# Patient Record
Sex: Male | Born: 1950
Health system: Southern US, Community
[De-identification: ages and names within clinical notes are randomized; demographics above are authoritative.]

## PROBLEM LIST (undated history)

## (undated) DIAGNOSIS — I10 Essential (primary) hypertension: Secondary | ICD-10-CM

## (undated) DIAGNOSIS — I509 Heart failure, unspecified: Secondary | ICD-10-CM

## (undated) DIAGNOSIS — M21379 Foot drop, unspecified foot: Secondary | ICD-10-CM

## (undated) DIAGNOSIS — T7840XA Allergy, unspecified, initial encounter: Secondary | ICD-10-CM

## (undated) DIAGNOSIS — I350 Nonrheumatic aortic (valve) stenosis: Secondary | ICD-10-CM

## (undated) DIAGNOSIS — I25119 Atherosclerotic heart disease of native coronary artery with unspecified angina pectoris: Secondary | ICD-10-CM

## (undated) DIAGNOSIS — Z9119 Patient's noncompliance with other medical treatment and regimen: Secondary | ICD-10-CM

## (undated) DIAGNOSIS — M48062 Spinal stenosis, lumbar region with neurogenic claudication: Secondary | ICD-10-CM

## (undated) DIAGNOSIS — I4811 Longstanding persistent atrial fibrillation: Secondary | ICD-10-CM

## (undated) DIAGNOSIS — Z7901 Long term (current) use of anticoagulants: Secondary | ICD-10-CM

## (undated) DIAGNOSIS — I639 Cerebral infarction, unspecified: Secondary | ICD-10-CM

## (undated) DIAGNOSIS — I4891 Unspecified atrial fibrillation: Secondary | ICD-10-CM

## (undated) DIAGNOSIS — H269 Unspecified cataract: Secondary | ICD-10-CM

## (undated) DIAGNOSIS — G4733 Obstructive sleep apnea (adult) (pediatric): Secondary | ICD-10-CM

## (undated) DIAGNOSIS — G4730 Sleep apnea, unspecified: Secondary | ICD-10-CM

## (undated) DIAGNOSIS — M159 Polyosteoarthritis, unspecified: Secondary | ICD-10-CM

## (undated) DIAGNOSIS — I519 Heart disease, unspecified: Secondary | ICD-10-CM

## (undated) DIAGNOSIS — I272 Pulmonary hypertension, unspecified: Secondary | ICD-10-CM

## (undated) HISTORY — DX: Morbid (severe) obesity due to excess calories: E66.01

## (undated) HISTORY — PX: TIBIA FRACTURE SURGERY: SHX806

## (undated) HISTORY — PX: REPLACEMENT TOTAL KNEE: SUR1224

## (undated) HISTORY — DX: Heart failure, unspecified: I50.9

## (undated) HISTORY — DX: Heart disease, unspecified: I51.9

## (undated) HISTORY — DX: Nonrheumatic aortic (valve) stenosis: I35.0

## (undated) HISTORY — DX: Longstanding persistent atrial fibrillation: I48.11

## (undated) HISTORY — DX: Obstructive sleep apnea (adult) (pediatric): G47.33

## (undated) HISTORY — PX: EYE SURGERY: SHX253

## (undated) HISTORY — DX: Spinal stenosis, lumbar region with neurogenic claudication: M48.062

## (undated) HISTORY — DX: Essential (primary) hypertension: I10

## (undated) HISTORY — DX: Foot drop, unspecified foot: M21.379

## (undated) HISTORY — DX: Unspecified cataract: H26.9

## (undated) HISTORY — DX: Sleep apnea, unspecified: G47.30

## (undated) HISTORY — DX: Patient's noncompliance with other medical treatment and regimen: Z91.19

## (undated) HISTORY — PX: JOINT REPLACEMENT: SHX530

## (undated) HISTORY — DX: Pulmonary hypertension, unspecified: I27.20

## (undated) HISTORY — DX: Allergy, unspecified, initial encounter: T78.40XA

## (undated) HISTORY — DX: Long term (current) use of anticoagulants: Z79.01

## (undated) HISTORY — DX: Polyosteoarthritis, unspecified: M15.9

## (undated) HISTORY — DX: Unspecified atrial fibrillation: I48.91

## (undated) HISTORY — DX: Cerebral infarction, unspecified: I63.9

## (undated) HISTORY — DX: Atherosclerotic heart disease of native coronary artery with unspecified angina pectoris: I25.119

## (undated) SURGERY — IRRIGATION AND DEBRIDEMENT WOUND
Anesthesia: General | Site: Leg Upper | Laterality: Right

---

## 2015-05-29 LAB — BASIC METABOLIC PANEL
POTASSIUM: 3.6 mmol/L (ref 3.4–5.3)
SODIUM: 140 mmol/L (ref 137–147)

## 2015-05-29 LAB — HEPATIC FUNCTION PANEL
ALT: 43 U/L — AB (ref 10–40)
AST: 31 U/L (ref 14–40)
Alkaline Phosphatase: 107 U/L (ref 25–125)
BILIRUBIN, TOTAL: 0.5 mg/dL

## 2015-05-29 LAB — LIPID PANEL
Cholesterol: 148 mg/dL (ref 0–200)
HDL: 36 mg/dL (ref 35–70)
LDL Cholesterol: 60 mg/dL
Triglycerides: 259 mg/dL — AB (ref 40–160)

## 2015-05-29 LAB — HEMOGLOBIN A1C: Hemoglobin A1C: 6.2

## 2015-08-08 LAB — LIPID PANEL
Cholesterol: 150 mg/dL (ref 0–200)
HDL: 38 mg/dL (ref 35–70)
LDL CALC: 66 mg/dL

## 2015-08-08 LAB — HEPATIC FUNCTION PANEL
ALK PHOS: 94 U/L (ref 25–125)
ALT: 57 U/L — AB (ref 10–40)
AST: 35 U/L (ref 14–40)
Bilirubin, Total: 0.7 mg/dL

## 2015-08-08 LAB — BASIC METABOLIC PANEL
Creatinine: 0.9 mg/dL (ref 0.6–1.3)
Potassium: 3.3 mmol/L — AB (ref 3.4–5.3)
Sodium: 141 mmol/L (ref 137–147)

## 2015-08-08 LAB — CALCIUM: CALCIUM: 9.3 mg/dL

## 2015-08-08 LAB — CO2, TOTAL: Carbon Dioxide, Total: 32

## 2015-08-08 LAB — HEMOGLOBIN A1C: HEMOGLOBIN A1C: 6.1

## 2015-08-08 LAB — CHLORIDE: Chloride: 101 mmol/L

## 2015-08-08 LAB — ALBUMIN: ALBUMIN: 3.8

## 2015-08-08 LAB — T4, FREE: Free T4: 1.05

## 2015-08-08 LAB — TSH: TSH: 0.74 u[IU]/mL (ref 0.41–5.90)

## 2016-03-14 DIAGNOSIS — G4730 Sleep apnea, unspecified: Secondary | ICD-10-CM | POA: Diagnosis not present

## 2016-03-14 DIAGNOSIS — I482 Chronic atrial fibrillation: Secondary | ICD-10-CM | POA: Diagnosis not present

## 2016-03-14 DIAGNOSIS — M179 Osteoarthritis of knee, unspecified: Secondary | ICD-10-CM | POA: Diagnosis not present

## 2016-03-14 DIAGNOSIS — R7301 Impaired fasting glucose: Secondary | ICD-10-CM | POA: Diagnosis not present

## 2016-03-14 DIAGNOSIS — I10 Essential (primary) hypertension: Secondary | ICD-10-CM | POA: Diagnosis not present

## 2016-03-14 DIAGNOSIS — E7800 Pure hypercholesterolemia, unspecified: Secondary | ICD-10-CM | POA: Diagnosis not present

## 2016-04-17 DIAGNOSIS — I509 Heart failure, unspecified: Secondary | ICD-10-CM | POA: Diagnosis not present

## 2016-04-17 DIAGNOSIS — I4891 Unspecified atrial fibrillation: Secondary | ICD-10-CM | POA: Diagnosis not present

## 2016-04-17 DIAGNOSIS — I10 Essential (primary) hypertension: Secondary | ICD-10-CM | POA: Diagnosis not present

## 2016-04-17 DIAGNOSIS — E669 Obesity, unspecified: Secondary | ICD-10-CM | POA: Diagnosis not present

## 2016-04-17 DIAGNOSIS — I350 Nonrheumatic aortic (valve) stenosis: Secondary | ICD-10-CM | POA: Diagnosis not present

## 2016-04-23 DIAGNOSIS — I4891 Unspecified atrial fibrillation: Secondary | ICD-10-CM | POA: Diagnosis not present

## 2016-05-24 DIAGNOSIS — H25812 Combined forms of age-related cataract, left eye: Secondary | ICD-10-CM | POA: Diagnosis not present

## 2016-05-24 DIAGNOSIS — H43313 Vitreous membranes and strands, bilateral: Secondary | ICD-10-CM | POA: Diagnosis not present

## 2016-05-24 DIAGNOSIS — H25811 Combined forms of age-related cataract, right eye: Secondary | ICD-10-CM | POA: Diagnosis not present

## 2016-06-10 DIAGNOSIS — I10 Essential (primary) hypertension: Secondary | ICD-10-CM | POA: Diagnosis not present

## 2016-06-10 DIAGNOSIS — I482 Chronic atrial fibrillation: Secondary | ICD-10-CM | POA: Diagnosis not present

## 2016-06-10 DIAGNOSIS — E7800 Pure hypercholesterolemia, unspecified: Secondary | ICD-10-CM | POA: Diagnosis not present

## 2016-06-10 LAB — BASIC METABOLIC PANEL
BUN: 13 (ref 4–21)
BUN: 13 mg/dL (ref 4–21)
BUN: 13 mg/dL (ref 4–21)
CREATININE: 1.2 mg/dL (ref 0.6–1.3)
CREATININE: 1.2 mg/dL (ref 0.6–1.3)
Creatinine: 1.2 (ref 0.6–1.3)
Glucose: 131
Glucose: 131 mg/dL
Glucose: 131 mg/dL
POTASSIUM: 3.3 — AB (ref 3.4–5.3)
POTASSIUM: 3.3 mmol/L — AB (ref 3.4–5.3)
POTASSIUM: 3.3 mmol/L — AB (ref 3.4–5.3)
SODIUM: 141 (ref 137–147)
SODIUM: 141 mmol/L (ref 137–147)
Sodium: 141 mmol/L (ref 137–147)

## 2016-06-10 LAB — LIPID PANEL
CHOLESTEROL: 149 (ref 0–200)
CHOLESTEROL: 149 mg/dL (ref 0–200)
Cholesterol: 149 mg/dL (ref 0–200)
HDL: 34 mg/dL — AB (ref 35–70)
HDL: 34 mg/dL — AB (ref 35–70)
HDL: 34 — AB (ref 35–70)
LDL Cholesterol: 65
LDL Cholesterol: 65 mg/dL
LDL Cholesterol: 65 mg/dL
Triglycerides: 250 mg/dL — AB (ref 40–160)
Triglycerides: 250 mg/dL — AB (ref 40–160)
Triglycerides: 250 — AB (ref 40–160)

## 2016-06-10 LAB — CBC AND DIFFERENTIAL
HCT: 41 % (ref 41–53)
HEMATOCRIT: 41 (ref 41–53)
HEMATOCRIT: 41 % (ref 41–53)
HEMOGLOBIN: 13.8 (ref 13.5–17.5)
HEMOGLOBIN: 13.8 g/dL (ref 13.5–17.5)
Hemoglobin: 13.8 g/dL (ref 13.5–17.5)
Neutrophils Absolute: 7
Neutrophils Absolute: 7 /uL
Neutrophils Absolute: 7 /uL
PLATELETS: 294 10*3/uL (ref 150–399)
Platelets: 294 (ref 150–399)
Platelets: 294 10*3/uL (ref 150–399)
WBC: 9.9
WBC: 9.9 10*3/mL
WBC: 9.9 10^3/mL

## 2016-06-10 LAB — HEPATIC FUNCTION PANEL
ALK PHOS: 89 U/L (ref 25–125)
ALT: 33 (ref 10–40)
ALT: 33 U/L (ref 10–40)
ALT: 33 U/L (ref 10–40)
AST: 30 (ref 14–40)
AST: 30 U/L (ref 14–40)
AST: 30 U/L (ref 14–40)
Alkaline Phosphatase: 89 (ref 25–125)
Alkaline Phosphatase: 89 U/L (ref 25–125)
BILIRUBIN, TOTAL: 0.6 mg/dL
Bilirubin, Total: 0.6
Bilirubin, Total: 0.6 mg/dL

## 2016-06-19 DIAGNOSIS — I10 Essential (primary) hypertension: Secondary | ICD-10-CM | POA: Diagnosis not present

## 2016-06-19 DIAGNOSIS — I482 Chronic atrial fibrillation: Secondary | ICD-10-CM | POA: Diagnosis not present

## 2016-06-19 DIAGNOSIS — R7301 Impaired fasting glucose: Secondary | ICD-10-CM | POA: Diagnosis not present

## 2016-06-19 DIAGNOSIS — M179 Osteoarthritis of knee, unspecified: Secondary | ICD-10-CM | POA: Diagnosis not present

## 2016-06-19 DIAGNOSIS — E7800 Pure hypercholesterolemia, unspecified: Secondary | ICD-10-CM | POA: Diagnosis not present

## 2016-10-08 ENCOUNTER — Encounter: Payer: Self-pay | Admitting: Family Medicine

## 2016-10-08 ENCOUNTER — Ambulatory Visit (INDEPENDENT_AMBULATORY_CARE_PROVIDER_SITE_OTHER): Payer: Medicare Other | Admitting: Family Medicine

## 2016-10-08 DIAGNOSIS — I482 Chronic atrial fibrillation, unspecified: Secondary | ICD-10-CM

## 2016-10-08 DIAGNOSIS — M159 Polyosteoarthritis, unspecified: Secondary | ICD-10-CM | POA: Insufficient documentation

## 2016-10-08 DIAGNOSIS — I272 Pulmonary hypertension, unspecified: Secondary | ICD-10-CM

## 2016-10-08 DIAGNOSIS — I639 Cerebral infarction, unspecified: Secondary | ICD-10-CM | POA: Diagnosis not present

## 2016-10-08 DIAGNOSIS — I25119 Atherosclerotic heart disease of native coronary artery with unspecified angina pectoris: Secondary | ICD-10-CM

## 2016-10-08 DIAGNOSIS — M1990 Unspecified osteoarthritis, unspecified site: Secondary | ICD-10-CM | POA: Insufficient documentation

## 2016-10-08 DIAGNOSIS — I10 Essential (primary) hypertension: Secondary | ICD-10-CM | POA: Diagnosis not present

## 2016-10-08 DIAGNOSIS — I4891 Unspecified atrial fibrillation: Secondary | ICD-10-CM | POA: Insufficient documentation

## 2016-10-08 DIAGNOSIS — M48062 Spinal stenosis, lumbar region with neurogenic claudication: Secondary | ICD-10-CM

## 2016-10-08 DIAGNOSIS — I509 Heart failure, unspecified: Secondary | ICD-10-CM | POA: Diagnosis not present

## 2016-10-08 DIAGNOSIS — M21379 Foot drop, unspecified foot: Secondary | ICD-10-CM

## 2016-10-08 DIAGNOSIS — G4733 Obstructive sleep apnea (adult) (pediatric): Secondary | ICD-10-CM | POA: Insufficient documentation

## 2016-10-08 DIAGNOSIS — R7303 Prediabetes: Secondary | ICD-10-CM | POA: Diagnosis not present

## 2016-10-08 DIAGNOSIS — Z23 Encounter for immunization: Secondary | ICD-10-CM | POA: Diagnosis not present

## 2016-10-08 DIAGNOSIS — M48061 Spinal stenosis, lumbar region without neurogenic claudication: Secondary | ICD-10-CM

## 2016-10-08 DIAGNOSIS — I5032 Chronic diastolic (congestive) heart failure: Secondary | ICD-10-CM | POA: Insufficient documentation

## 2016-10-08 DIAGNOSIS — E66813 Obesity, class 3: Secondary | ICD-10-CM

## 2016-10-08 DIAGNOSIS — I5042 Chronic combined systolic (congestive) and diastolic (congestive) heart failure: Secondary | ICD-10-CM | POA: Insufficient documentation

## 2016-10-08 DIAGNOSIS — E559 Vitamin D deficiency, unspecified: Secondary | ICD-10-CM | POA: Diagnosis not present

## 2016-10-08 DIAGNOSIS — G4730 Sleep apnea, unspecified: Secondary | ICD-10-CM

## 2016-10-08 DIAGNOSIS — I2510 Atherosclerotic heart disease of native coronary artery without angina pectoris: Secondary | ICD-10-CM

## 2016-10-08 DIAGNOSIS — Z7901 Long term (current) use of anticoagulants: Secondary | ICD-10-CM

## 2016-10-08 HISTORY — DX: Long term (current) use of anticoagulants: Z79.01

## 2016-10-08 HISTORY — DX: Chronic combined systolic (congestive) and diastolic (congestive) heart failure: I50.42

## 2016-10-08 HISTORY — DX: Chronic atrial fibrillation, unspecified: I48.20

## 2016-10-08 HISTORY — DX: Spinal stenosis, lumbar region without neurogenic claudication: M48.061

## 2016-10-08 HISTORY — DX: Pulmonary hypertension, unspecified: I27.20

## 2016-10-08 HISTORY — DX: Morbid (severe) obesity due to excess calories: E66.01

## 2016-10-08 HISTORY — DX: Unspecified atrial fibrillation: I48.91

## 2016-10-08 HISTORY — DX: Spinal stenosis, lumbar region with neurogenic claudication: M48.062

## 2016-10-08 HISTORY — DX: Obstructive sleep apnea (adult) (pediatric): G47.33

## 2016-10-08 HISTORY — DX: Atherosclerotic heart disease of native coronary artery with unspecified angina pectoris: I25.119

## 2016-10-08 HISTORY — DX: Sleep apnea, unspecified: G47.30

## 2016-10-08 HISTORY — DX: Foot drop, unspecified foot: M21.379

## 2016-10-08 HISTORY — DX: Obesity, class 3: E66.813

## 2016-10-08 HISTORY — DX: Polyosteoarthritis, unspecified: M15.9

## 2016-10-08 NOTE — Assessment & Plan Note (Addendum)
Chronic pain meds- pt has taken lortab daily for many yrs.  ( at least 77yrs now) of the 10-325mg .   Has enough pills for now.  - told patient in the future if he needs refills on his pain meds, I will give them to him one time per yr or so (PCP can give it to him another time) , but if this is a chronic deal-  pt will need to be referred to a chronic pain management specialist.

## 2016-10-08 NOTE — Progress Notes (Signed)
New patient office visit note:  Impression and Recommendations:    1. Coronary arteriosclerosis- med mgt   2. h/o Cerebrovascular accident (CVA)   3. Congestive heart failure, unspecified congestive heart failure chronicity, unspecified congestive heart failure type ([place])   4. Hypertension, unspecified type   5. OSA (obstructive sleep apnea)   6. Need for prophylactic vaccination and inoculation against influenza   7. Foot-drop, unspecified laterality   8. Spinal stenosis, lumbar region, with neurogenic claudication   9. Chronic atrial fibrillation (HCC)   10. Obesity, Class III, BMI 40-49.9 (morbid obesity) ([place])   11. Chronic anticoagulation      All of patient's chronic past medical history reviewed with him. Entire history came from patient. Asked him to please get me his medical records from his physicians in [place].   We'll obtain fasting labs today since patient is fasting and has no labs with him/ med records.   We'll also obtain baseline BNP to assess patient's CHF status- since patient has no active symptoms and currently is stable.   Patient will continue with all his specialists up in [place] as this is what he prefers- will be 50-50 split btwn here and [place]- but prefers to keep them as primary since they have known him so long.  Spinal stenosis, lumbar region, with neurogenic claudication Chronic pain meds- pt has taken lortab daily for many yrs.  ( at least 65yrs now) of the 10-325mg .   Has enough pills for now.  - told patient in the future if he needs refills on his pain meds, I will give them to him one time per yr or so (PCP can give it to him another time) , but if this is a chronic deal-  pt will need to be referred to a chronic pain management specialist.  Obesity, Class III, BMI 40-49.9 (morbid obesity) ([place]) - Counseled patient on pathophysiology of disease and discussed various treatment options, which often includes dietary and lifestyle  modifications as first line.  - Anticipatory guidance given.   - Encouraged to return to clinic or call the office with any further questions or concerns.  A-fib- couple yrs now. Stable asymptomatic, regular rate and rhythm today.  Coronary arteriosclerosis- med mgt Told patient to get me old records on his  atherosclerotic cardiovascular disease history including any cardiologist, neurologist etc. you have seen  - not sure why he is NOT on statin as pt declines allergy/ s-e  Chronic anticoagulation Gets Eliquis from his cardiologist. Stable  Hypertension Well controlled at 117/62.  meds per his Cards doc in [place] changes such as dash diet and engaging in a regular exercise program discussed with patient.  Educational handouts provided  Ambulatory BP monitoring encouraged. Keep log and bring in next OV  Continue current medication(s).   Contact us prior with any Q's/ concerns.  h/o Stroke 11 yrs ago-   Found incidentally on MRI 2003 or so--> saw scar on MRI  -> PT doesn't recall having any sx.   CHF (congestive heart failure) (HCC) Sleeps at 30 angle of the constantly. No increased shortness of breath or symptoms currently  - monitor wt regularly  - bmp near future   OSA (obstructive sleep apnea) Bi-PAP nitely- been 20+ yrs now   Flu vaccine given today   Orders Placed This Encounter  Procedures  . Flu vaccine HIGH DOSE PF (Fluzone High dose)  . CBC with Differential/Platelet  . COMPLETE METABOLIC  PANEL WITH GFR  . Hemoglobin A1c  . Lipid panel  . T4, free  . TSH  . VITAMIN D 25 Hydroxy (Vit-D Deficiency, Fractures)  . Brain natriuretic peptide    Patient's Medications  New Prescriptions   No medications on file  Previous Medications   APIXABAN (ELIQUIS) 5 MG TABS TABLET    Take 5 mg by mouth 2 (two) times daily.   BUMETANIDE (BUMEX) 2 MG TABLET    Take 2 mg by mouth 2 (two) times daily.   CARVEDILOL (COREG) 25 MG TABLET    Take 25 mg by  mouth 2 (two) times daily with a meal.   FELODIPINE (PLENDIL) 10 MG 24 HR TABLET    Take 10 mg by mouth 2 (two) times daily.   HYDRALAZINE (APRESOLINE) 25 MG TABLET    Take 25 mg by mouth 3 (three) times daily.   LOSARTAN (COZAAR) 100 MG TABLET    Take 100 mg by mouth daily.   MULTIPLE VITAMIN (MULTIVITAMIN) TABLET    Take 1 tablet by mouth daily.   NEBIVOLOL HCL (BYSTOLIC) 20 MG TABS    Take 1 tablet by mouth daily.   POTASSIUM CHLORIDE (K-DUR,KLOR-CON) 10 MEQ TABLET    Take 20 mEq by mouth 2 (two) times daily.  Modified Medications   No medications on file  Discontinued Medications   No medications on file    Return for couple months or so for MMP/ chronic care,  and prn.  The patient was counseled, risk factors were discussed, anticipatory guidance given.  Gross side effects, risk and benefits, and alternatives of medications discussed with patient.  Patient is aware that all medications have potential side effects and we are unable to predict every side effect or drug-drug interaction that may occur.  Expresses verbal understanding and consents to current therapy plan and treatment regimen.  Please see AVS handed out to patient at the end of our visit for further patient instructions/ counseling done pertaining to today's office visit.    Note: This document was prepared using Dragon voice recognition software and may include unintentional dictation errors.  ----------------------------------------------------------------------------------------------------------------------    Subjective:    Chief Complaint  Patient presents with  . Establish Care    HPI: Joel Dominguez is a pleasant 65 y.o. male who presents to [place] at [facility] today to review their medical history with me and establish care.   I asked the patient to review their chronic problem list with me to ensure everything was updated and accurate.     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  Beagle- angel- at home also  grandchild  2 here and 1 in [place].   Works now as [employer].  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     [facility] Tamala Julian in [place],       Patient Care Team    Relationship Specialty Notifications Start End  Mellody Dance, DO PCP - General Family Medicine  10/08/16      Wt Readings from Last 3 Encounters:  10/08/16 (!) 349 lb 12.8 oz (158.7 kg)   BP Readings from Last 3 Encounters:  10/08/16 117/62   Pulse Readings from Last 3 Encounters:  10/08/16 68   BMI Readings from Last 3 Encounters:  10/08/16 47.44 kg/m   Lab Results  Component Value Date   HGBA1C 5.8 (H) 10/08/2016    Patient Active Problem  List   Diagnosis Date Noted  . Hypertension 10/08/2016    Priority: High  . OSA (obstructive sleep apnea) 10/08/2016    Priority: High  . Obesity, Class III, BMI 40-49.9 (morbid obesity) ([place]) 10/08/2016    Priority: High  . h/o Stroke 10/08/2016    Priority: Medium  . Coronary arteriosclerosis- med mgt 10/08/2016    Priority: Medium  . CHF (congestive heart failure) ([place]) 10/08/2016    Priority: Medium  . Pulmonary hypertension 10/08/2016    Priority: Medium  . A-fib- couple yrs now. 10/08/2016    Priority: Medium  . Chronic anticoagulation 10/08/2016    Priority: Medium  . Generalized OA 10/08/2016    Priority: Low  . Foot drop- R 10/08/2016    Priority: Low  . Spinal stenosis, lumbar region, with neurogenic claudication 10/08/2016    Priority: Low     Past Medical History:  Diagnosis Date  . CHF (congestive heart failure) ([place])   . Heart disease   . Hypertension   . Stroke [facility])      Past Surgical History:  Procedure Laterality Date  . REPLACEMENT TOTAL KNEE Bilateral   . TIBIA FRACTURE SURGERY       Family History  Problem Relation Age of Onset  . Congestive Heart Failure Mother   . Hypertension Mother   . Diabetes Mother   .  Cancer Father     lung  . Congestive Heart Failure Sister   . Hypertension Sister   . Hypertension Brother   . Hypertension Sister   . Diabetes Brother      History  Drug Use No    History  Alcohol Use  . Yes    Comment: once monthly    History  Smoking Status  . Never Smoker  Smokeless Tobacco  . Never Used    Patient's Medications  New Prescriptions   No medications on file  Previous Medications   APIXABAN (ELIQUIS) 5 MG TABS TABLET    Take 5 mg by mouth 2 (two) times daily.   BUMETANIDE (BUMEX) 2 MG TABLET    Take 2 mg by mouth 2 (two) times daily.   CARVEDILOL (COREG) 25 MG TABLET    Take 25 mg by mouth 2 (two) times daily with a meal.   FELODIPINE (PLENDIL) 10 MG 24 HR TABLET    Take 10 mg by mouth 2 (two) times daily.   HYDRALAZINE (APRESOLINE) 25 MG TABLET    Take 25 mg by mouth 3 (three) times daily.   LOSARTAN (COZAAR) 100 MG TABLET    Take 100 mg by mouth daily.   MULTIPLE VITAMIN (MULTIVITAMIN) TABLET    Take 1 tablet by mouth daily.   NEBIVOLOL HCL (BYSTOLIC) 20 MG TABS    Take 1 tablet by mouth daily.   POTASSIUM CHLORIDE (K-DUR,KLOR-CON) 10 MEQ TABLET    Take 20 mEq by mouth 2 (two) times daily.  Modified Medications   No medications on file  Discontinued Medications   No medications on file    Allergies: Patient has no known allergies.  Review of Systems  Constitutional: Negative.  Negative for chills, diaphoresis, fever, malaise/fatigue and weight loss.  HENT: Positive for tinnitus. Negative for congestion and sore throat.        Chronic-stable tinnitus  Eyes: Negative.  Negative for blurred vision, double vision and photophobia.  Respiratory: Positive for cough. Negative for wheezing.        Chronic cough secondary to CHF.  Cardiovascular: Negative.  Negative for  chest pain and palpitations.  Gastrointestinal: Negative.  Negative for blood in stool, diarrhea, nausea and vomiting.  Genitourinary: Negative.  Negative for dysuria, frequency and  urgency.  Musculoskeletal: Positive for joint pain. Negative for myalgias.       Bilateral knee replacements, other joint pains due to osteoarthritis  Skin: Negative.  Negative for itching and rash.  Neurological: Negative.  Negative for dizziness, focal weakness, weakness and headaches.  Endo/Heme/Allergies: Negative for environmental allergies and polydipsia. Bruises/bleeds easily.       On chronic anticoagulation  Psychiatric/Behavioral: Negative.  Negative for depression and memory loss. The patient is not nervous/anxious and does not have insomnia.      Objective:   Blood pressure 117/62, pulse 68, height 6' (1.829 m), weight (!) 349 lb 12.8 oz (158.7 kg). Body mass index is 47.44 kg/m. General: Well Developed, well nourished, and in no acute distress.  Neuro: Alert and oriented x3, extra-ocular muscles intact, sensation grossly intact.  HEENT: Normocephalic, atraumatic, pupils equal round reactive to light, neck supple, no bruits Skin: no gross suspicious lesions or rashes  Cardiac: Regular rate and rhythm- no irregular heartbeat appreciated, no murmurs rubs or gallops.  Respiratory: Essentially clear to auscultation bilaterally. Not using accessory muscles, speaking in full sentences.  Abdominal:obese Musculoskeletal: Ambulates w/o diff, FROM * 4 ext.  Vasc: less 2 sec cap RF, warm and pink  Psych:  No HI/SI, judgement and insight good, Euthymic mood. Full Affect.

## 2016-10-08 NOTE — Patient Instructions (Addendum)
We will give flu shot today in the office.   Follow-up in the future at your convenience to review labs and continue to review chronic medical issues further   Guidelines for a Low Sodium Diet   Low Sodium Diet A main source of sodium is table salt. The average American eats five or more teaspoons of salt each day. This is about 20 times as much as the body needs. In fact, your body needs only 1/4 teaspoon of salt every day. Sodium is found naturally in foods, but a lot of it is added during processing and preparation. Many foods that do not taste salty may still be high in sodium. Large amounts of sodium can be hidden in canned, processed and convenience foods. And sodium can be found in many foods that are served at [employer].  Sodium controls fluid balance in our bodies and maintains blood volume and blood pressure. Eating too much sodium may raise blood pressure and cause fluid retention, which could lead to swelling of the legs and feet or other health issues.  When limiting sodium in your diet, a common target is to eat less than 2,000 milligrams of sodium per day.   General Guidelines for Cutting Down on Salt Eliminate salty foods from your diet and reduce the amount of salt used in cooking. Sea salt is no better than regular salt.  Choose low sodium foods. Many salt-free or reduced salt products are available. When reading food labels, low sodium is defined as 140 mg of sodium per serving.  Salt substitutes are sometimes made from potassium, so read the label. If you are on a low potassium diet, then check with your doctor before using those salt substitutes.  Be creative and season your foods with spices, herbs, lemon, garlic, ginger, vinegar and pepper. Remove the salt shaker from the table.  Read ingredient labels to identify foods high in sodium. Items with 400 mg or more of sodium are high in sodium. High sodium food additives include salt, brine, or other items that  say sodium, such as monosodium glutamate.  Eat more home-cooked meals. Foods cooked from scratch are naturally lower in sodium than most instant and boxed mixes.  Don't use softened water for cooking and drinking since it contains added salt.  Avoid medications which contain sodium such as [employer].  For more information; food composition books are available which tell how much sodium is in food. Online sources such as www.calorieking.com also list amounts.     Meats, Poultry, Fish, Legumes, Eggs and Nuts  High-Sodium Foods: Smoked, cured, salted or canned meat, fish or poultry including bacon, cold cuts, ham, frankfurters, sausage, sardines, caviar and anchovies Frozen breaded meats and dinners, such as burritos and pizza Canned entrees, such as ravioli, spam and chili Salted nuts Beans canned with salt added  Low-Sodium Alternatives: Any fresh or frozen beef, lamb, pork, poultry and fish Eggs and egg substitutes Low-sodium peanut butter Dry peas and beans (not canned) Low-sodium canned fish Drained, water or oil packed canned fish or poultry   Dairy Products  High-Sodium Foods: Buttermilk Regular and processed cheese, cheese spreads and sauces Cottage cheese  Low-Sodium Alternatives: Milk, yogurt, ice cream and ice milk Low-sodium cheeses, cream cheese, ricotta cheese and mozzarella   Breads, Grains and Cereals  High-Sodium Foods: Bread and rolls with salted tops Quick breads, self-rising flour, biscuit, pancake and waffle mixes Pizza, croutons and salted crackers Prepackaged, processed mixes for potatoes, rice, pasta and  stuffing  Low-Sodium Alternatives: Breads, bagels and rolls without salted tops Muffins and most ready-to-eat cereals All rice and pasta, but do not to add salt when cooking Low-sodium corn and flour tortillas and noodles Low-sodium crackers and breadsticks Unsalted popcorn, chips and pretzels      Vegetables and  Fruits  High-Sodium Foods: Regular canned vegetables and vegetable juices Olives, pickles, sauerkraut and other pickled vegetables Vegetables made with ham, bacon or salted pork Packaged mixes, such as scalloped or au gratin potatoes, frozen hash browns and Tater Tots Commercially prepared pasta and tomato sauces and salsa  Low-Sodium Alternatives: Fresh and frozen vegetables without sauces Low-sodium canned vegetables, sauces and juices Fresh potatoes, frozen [place] fries and instant mashed potatoes Low-salt tomato or V-8 juice. Most fresh, frozen and canned fruit Dried fruits   Soups  High-Sodium Foods: Regular canned and dehydrated soup, broth and bouillon Cup of noodles and seasoned ramen mixes  Low-Sodium Alternatives: Low-sodium canned and dehydrated soups, broth and bouillon Homemade soups without added salt   Fats, Desserts and Sweets  High-Sodium Foods: Soy sauce, seasoning salt, other sauces and marinades Bottled salad dressings, regular salad dressing with bacon bits Salted butter or margarine Instant pudding and cake Large portions of ketchup, mustard  Low-Sodium Alternatives: Vinegar, unsalted butter or margarine Vegetable oils and low sodium sauces and salad dressings Mayonnaise All desserts made without salt

## 2016-10-09 LAB — CBC WITH DIFFERENTIAL/PLATELET
BASOS ABS: 92 {cells}/uL (ref 0–200)
Basophils Relative: 1 %
EOS PCT: 3 %
Eosinophils Absolute: 276 cells/uL (ref 15–500)
HCT: 39.6 % (ref 38.5–50.0)
Hemoglobin: 13.2 g/dL (ref 13.2–17.1)
Lymphocytes Relative: 18 %
Lymphs Abs: 1656 cells/uL (ref 850–3900)
MCH: 28.9 pg (ref 27.0–33.0)
MCHC: 33.3 g/dL (ref 32.0–36.0)
MCV: 86.8 fL (ref 80.0–100.0)
MONOS PCT: 6 %
MPV: 10 fL (ref 7.5–12.5)
Monocytes Absolute: 552 cells/uL (ref 200–950)
NEUTROS PCT: 72 %
Neutro Abs: 6624 cells/uL (ref 1500–7800)
PLATELETS: 362 10*3/uL (ref 140–400)
RBC: 4.56 MIL/uL (ref 4.20–5.80)
RDW: 15.7 % — AB (ref 11.0–15.0)
WBC: 9.2 10*3/uL (ref 3.8–10.8)

## 2016-10-09 LAB — LIPID PANEL
CHOL/HDL RATIO: 4.3 ratio (ref <5.0)
Cholesterol: 156 mg/dL (ref <200)
HDL: 36 mg/dL — ABNORMAL LOW (ref >40)
LDL Cholesterol: 75 mg/dL (ref <100)
Triglycerides: 225 mg/dL — ABNORMAL HIGH (ref <150)
VLDL: 45 mg/dL — AB (ref <30)

## 2016-10-09 LAB — COMPLETE METABOLIC PANEL WITH GFR
ALBUMIN: 4.4 g/dL (ref 3.6–5.1)
ALK PHOS: 74 U/L (ref 40–115)
ALT: 36 U/L (ref 9–46)
AST: 36 U/L — ABNORMAL HIGH (ref 10–35)
BILIRUBIN TOTAL: 0.8 mg/dL (ref 0.2–1.2)
BUN: 14 mg/dL (ref 7–25)
CO2: 32 mmol/L — AB (ref 20–31)
CREATININE: 1.02 mg/dL (ref 0.70–1.25)
Calcium: 9.5 mg/dL (ref 8.6–10.3)
Chloride: 97 mmol/L — ABNORMAL LOW (ref 98–110)
GFR, EST AFRICAN AMERICAN: 89 mL/min (ref >=60)
GFR, Est Non African American: 77 mL/min (ref >=60)
Glucose, Bld: 123 mg/dL — ABNORMAL HIGH (ref 65–99)
Potassium: 3.8 mmol/L (ref 3.5–5.3)
Sodium: 140 mmol/L (ref 135–146)
TOTAL PROTEIN: 8.1 g/dL (ref 6.1–8.1)

## 2016-10-09 LAB — T4, FREE: FREE T4: 1.1 ng/dL (ref 0.8–1.8)

## 2016-10-09 LAB — TSH: TSH: 0.75 m[IU]/L (ref 0.40–4.50)

## 2016-10-09 LAB — BRAIN NATRIURETIC PEPTIDE: Brain Natriuretic Peptide: 105.4 pg/mL — ABNORMAL HIGH (ref <100)

## 2016-10-09 LAB — HEMOGLOBIN A1C
Hgb A1c MFr Bld: 5.8 % — ABNORMAL HIGH (ref <5.7)
MEAN PLASMA GLUCOSE: 120 mg/dL

## 2016-10-09 LAB — VITAMIN D 25 HYDROXY (VIT D DEFICIENCY, FRACTURES): VIT D 25 HYDROXY: 28 ng/mL — AB (ref 30–100)

## 2016-10-20 NOTE — Assessment & Plan Note (Signed)
Sleeps at 30 angle of the constantly. No increased shortness of breath or symptoms currently  - monitor wt regularly  - bmp near future

## 2016-10-20 NOTE — Assessment & Plan Note (Signed)
11 yrs ago-   Found incidentally on MRI 2003 or so--> saw scar on MRI  -> PT doesn't recall having any sx.

## 2016-10-20 NOTE — Assessment & Plan Note (Signed)
Gets Eliquis from his cardiologist. Stable

## 2016-10-20 NOTE — Assessment & Plan Note (Signed)
Stable asymptomatic, regular rate and rhythm today.

## 2016-10-20 NOTE — Assessment & Plan Note (Addendum)
Well controlled at 117/62.  meds per his Cards doc in [place] changes such as dash diet and engaging in a regular exercise program discussed with patient.  Educational handouts provided  Ambulatory BP monitoring encouraged. Keep log and bring in next OV  Continue current medication(s).   Contact us prior with any Q's/ concerns.

## 2016-10-20 NOTE — Assessment & Plan Note (Signed)
-   Counseled patient on pathophysiology of disease and discussed various treatment options, which often includes dietary and lifestyle modifications as first line.  - Anticipatory guidance given.   - Encouraged to return to clinic or call the office with any further questions or concerns.

## 2016-10-20 NOTE — Assessment & Plan Note (Signed)
>>  ASSESSMENT AND PLAN FOR OBESITY, CLASS III, BMI 40-49.9 (MORBID OBESITY) (HCC) WRITTEN ON 10/20/2016  2:59 PM BY OPALSKI, DEBORAH, DO  - Counseled patient on pathophysiology of disease and discussed various treatment options, which often includes dietary and lifestyle modifications as first line.  - Anticipatory guidance given.   - Encouraged to return to clinic or call the office with any further questions or concerns.

## 2016-10-20 NOTE — Assessment & Plan Note (Signed)
Bi-PAP nitely- been 20+ yrs now

## 2016-10-20 NOTE — Assessment & Plan Note (Addendum)
Told patient to get me old records on his  atherosclerotic cardiovascular disease history including any cardiologist, neurologist etc. you have seen  - not sure why he is NOT on statin as pt declines allergy/ s-e

## 2016-10-20 NOTE — Assessment & Plan Note (Signed)
>>  ASSESSMENT AND PLAN FOR CHRONIC COMBINED SYSTOLIC AND DIASTOLIC CHF (CONGESTIVE HEART FAILURE) (HCC) WRITTEN ON 10/20/2016  3:10 PM BY OPALSKI, DEBORAH, DO  Sleeps at 30 angle of the constantly. No increased shortness of breath or symptoms currently  - monitor wt regularly  - bmp near future

## 2017-01-08 ENCOUNTER — Ambulatory Visit: Payer: Medicare Other | Admitting: Family Medicine

## 2017-01-16 ENCOUNTER — Ambulatory Visit: Payer: Medicare Other | Admitting: Family Medicine

## 2017-03-11 DIAGNOSIS — R7301 Impaired fasting glucose: Secondary | ICD-10-CM | POA: Diagnosis not present

## 2017-03-11 DIAGNOSIS — E7800 Pure hypercholesterolemia, unspecified: Secondary | ICD-10-CM | POA: Diagnosis not present

## 2017-03-11 DIAGNOSIS — M179 Osteoarthritis of knee, unspecified: Secondary | ICD-10-CM | POA: Diagnosis not present

## 2017-03-11 DIAGNOSIS — I10 Essential (primary) hypertension: Secondary | ICD-10-CM | POA: Diagnosis not present

## 2017-03-11 LAB — CBC AND DIFFERENTIAL
HEMATOCRIT: 42 % (ref 41–53)
HEMOGLOBIN: 13.9 g/dL (ref 13.5–17.5)
Neutrophils Absolute: 5 /uL
Platelets: 312 10*3/uL (ref 150–399)
WBC: 7 10^3/mL

## 2017-03-11 LAB — HEPATIC FUNCTION PANEL
ALT: 22 U/L (ref 10–40)
AST: 20 U/L (ref 14–40)
Alkaline Phosphatase: 87 U/L (ref 25–125)
Bilirubin, Total: 0.8 mg/dL

## 2017-03-11 LAB — BASIC METABOLIC PANEL
BUN: 25 mg/dL — AB (ref 4–21)
CREATININE: 1.2 mg/dL (ref 0.6–1.3)
Glucose: 104 mg/dL
POTASSIUM: 3.2 mmol/L — AB (ref 3.4–5.3)
SODIUM: 139 mmol/L (ref 137–147)

## 2017-03-11 LAB — HEMOGLOBIN A1C: HEMOGLOBIN A1C: 5.3

## 2017-03-11 LAB — LIPID PANEL
CHOLESTEROL: 134 mg/dL (ref 0–200)
HDL: 34 mg/dL — AB (ref 35–70)
LDL Cholesterol: 73 mg/dL
TRIGLYCERIDES: 136 mg/dL (ref 40–160)

## 2017-03-12 DIAGNOSIS — Z79899 Other long term (current) drug therapy: Secondary | ICD-10-CM | POA: Diagnosis not present

## 2017-03-12 DIAGNOSIS — M179 Osteoarthritis of knee, unspecified: Secondary | ICD-10-CM | POA: Diagnosis not present

## 2017-03-28 DIAGNOSIS — R7301 Impaired fasting glucose: Secondary | ICD-10-CM | POA: Diagnosis not present

## 2017-03-28 DIAGNOSIS — I482 Chronic atrial fibrillation: Secondary | ICD-10-CM | POA: Diagnosis not present

## 2017-03-28 DIAGNOSIS — I10 Essential (primary) hypertension: Secondary | ICD-10-CM | POA: Diagnosis not present

## 2017-03-28 DIAGNOSIS — E7800 Pure hypercholesterolemia, unspecified: Secondary | ICD-10-CM | POA: Diagnosis not present

## 2017-04-04 DIAGNOSIS — I10 Essential (primary) hypertension: Secondary | ICD-10-CM | POA: Diagnosis not present

## 2017-04-04 DIAGNOSIS — I4891 Unspecified atrial fibrillation: Secondary | ICD-10-CM | POA: Diagnosis not present

## 2017-04-04 DIAGNOSIS — I350 Nonrheumatic aortic (valve) stenosis: Secondary | ICD-10-CM | POA: Diagnosis not present

## 2017-04-04 DIAGNOSIS — E669 Obesity, unspecified: Secondary | ICD-10-CM | POA: Diagnosis not present

## 2017-05-28 DIAGNOSIS — H43313 Vitreous membranes and strands, bilateral: Secondary | ICD-10-CM | POA: Diagnosis not present

## 2017-05-28 DIAGNOSIS — H25812 Combined forms of age-related cataract, left eye: Secondary | ICD-10-CM | POA: Diagnosis not present

## 2017-05-28 DIAGNOSIS — H25811 Combined forms of age-related cataract, right eye: Secondary | ICD-10-CM | POA: Diagnosis not present

## 2017-06-03 DIAGNOSIS — E7800 Pure hypercholesterolemia, unspecified: Secondary | ICD-10-CM | POA: Diagnosis not present

## 2017-06-03 DIAGNOSIS — I482 Chronic atrial fibrillation: Secondary | ICD-10-CM | POA: Diagnosis not present

## 2017-06-03 DIAGNOSIS — I10 Essential (primary) hypertension: Secondary | ICD-10-CM | POA: Diagnosis not present

## 2017-06-03 DIAGNOSIS — R7301 Impaired fasting glucose: Secondary | ICD-10-CM | POA: Diagnosis not present

## 2017-06-07 DIAGNOSIS — I482 Chronic atrial fibrillation: Secondary | ICD-10-CM | POA: Diagnosis not present

## 2017-06-07 DIAGNOSIS — M179 Osteoarthritis of knee, unspecified: Secondary | ICD-10-CM | POA: Diagnosis not present

## 2017-06-07 DIAGNOSIS — E7800 Pure hypercholesterolemia, unspecified: Secondary | ICD-10-CM | POA: Diagnosis not present

## 2017-07-08 DIAGNOSIS — E7800 Pure hypercholesterolemia, unspecified: Secondary | ICD-10-CM | POA: Diagnosis not present

## 2017-07-08 DIAGNOSIS — I482 Chronic atrial fibrillation: Secondary | ICD-10-CM | POA: Diagnosis not present

## 2017-07-08 LAB — HEPATIC FUNCTION PANEL
ALT: 14 (ref 10–40)
AST: 19 (ref 14–40)
Alkaline Phosphatase: 84 (ref 25–125)
Bilirubin, Total: 0.7

## 2017-07-08 LAB — LIPID PANEL
CHOLESTEROL: 150 (ref 0–200)
HDL: 36 (ref 35–70)
LDL Cholesterol: 84
Triglycerides: 150 (ref 40–160)

## 2017-07-08 LAB — BASIC METABOLIC PANEL
BUN: 18 (ref 4–21)
CREATININE: 1 (ref 0.6–1.3)
Glucose: 107
POTASSIUM: 3.2 — AB (ref 3.4–5.3)
Sodium: 142 (ref 137–147)

## 2017-07-08 LAB — CBC AND DIFFERENTIAL
HCT: 41 (ref 41–53)
HEMOGLOBIN: 14 (ref 13.5–17.5)
Neutrophils Absolute: 5
Platelets: 231 (ref 150–399)
WBC: 7.7

## 2017-07-15 DIAGNOSIS — E7800 Pure hypercholesterolemia, unspecified: Secondary | ICD-10-CM | POA: Diagnosis not present

## 2017-07-15 DIAGNOSIS — M179 Osteoarthritis of knee, unspecified: Secondary | ICD-10-CM | POA: Diagnosis not present

## 2017-07-15 DIAGNOSIS — Z23 Encounter for immunization: Secondary | ICD-10-CM | POA: Diagnosis not present

## 2017-07-15 DIAGNOSIS — I509 Heart failure, unspecified: Secondary | ICD-10-CM | POA: Diagnosis not present

## 2017-10-22 ENCOUNTER — Encounter: Payer: Self-pay | Admitting: Family Medicine

## 2017-10-22 ENCOUNTER — Ambulatory Visit (INDEPENDENT_AMBULATORY_CARE_PROVIDER_SITE_OTHER): Payer: Medicare Other | Admitting: Family Medicine

## 2017-10-22 VITALS — BP 142/86 | HR 72 | Ht 72.0 in | Wt 300.0 lb

## 2017-10-22 DIAGNOSIS — M5416 Radiculopathy, lumbar region: Secondary | ICD-10-CM

## 2017-10-22 DIAGNOSIS — Z9119 Patient's noncompliance with other medical treatment and regimen: Secondary | ICD-10-CM | POA: Insufficient documentation

## 2017-10-22 DIAGNOSIS — I25119 Atherosclerotic heart disease of native coronary artery with unspecified angina pectoris: Secondary | ICD-10-CM | POA: Diagnosis not present

## 2017-10-22 DIAGNOSIS — I509 Heart failure, unspecified: Secondary | ICD-10-CM

## 2017-10-22 DIAGNOSIS — I639 Cerebral infarction, unspecified: Secondary | ICD-10-CM | POA: Diagnosis not present

## 2017-10-22 DIAGNOSIS — I209 Angina pectoris, unspecified: Secondary | ICD-10-CM

## 2017-10-22 DIAGNOSIS — I272 Pulmonary hypertension, unspecified: Secondary | ICD-10-CM

## 2017-10-22 DIAGNOSIS — M48062 Spinal stenosis, lumbar region with neurogenic claudication: Secondary | ICD-10-CM

## 2017-10-22 DIAGNOSIS — Z7901 Long term (current) use of anticoagulants: Secondary | ICD-10-CM

## 2017-10-22 DIAGNOSIS — I482 Chronic atrial fibrillation, unspecified: Secondary | ICD-10-CM

## 2017-10-22 DIAGNOSIS — I10 Essential (primary) hypertension: Secondary | ICD-10-CM

## 2017-10-22 DIAGNOSIS — G4733 Obstructive sleep apnea (adult) (pediatric): Secondary | ICD-10-CM

## 2017-10-22 DIAGNOSIS — E559 Vitamin D deficiency, unspecified: Secondary | ICD-10-CM | POA: Diagnosis not present

## 2017-10-22 DIAGNOSIS — Z91199 Patient's noncompliance with other medical treatment and regimen due to unspecified reason: Secondary | ICD-10-CM

## 2017-10-22 DIAGNOSIS — E66813 Obesity, class 3: Secondary | ICD-10-CM

## 2017-10-22 DIAGNOSIS — R7302 Impaired glucose tolerance (oral): Secondary | ICD-10-CM | POA: Diagnosis not present

## 2017-10-22 DIAGNOSIS — Z79899 Other long term (current) drug therapy: Secondary | ICD-10-CM | POA: Diagnosis not present

## 2017-10-22 DIAGNOSIS — M21371 Foot drop, right foot: Secondary | ICD-10-CM | POA: Diagnosis not present

## 2017-10-22 HISTORY — DX: Patient's noncompliance with other medical treatment and regimen: Z91.19

## 2017-10-22 HISTORY — DX: Patient's noncompliance with other medical treatment and regimen due to unspecified reason: Z91.199

## 2017-10-22 MED ORDER — GABAPENTIN 300 MG PO CAPS: 300.0000 mg | ORAL_CAPSULE | Freq: Three times a day (TID) | ORAL | 3 refills | Status: DC

## 2017-10-22 NOTE — Progress Notes (Signed)
New patient office visit note:  Impression and Recommendations:    1. H/O noncompliance with medical treatment, presenting hazards to health   2. Atherosclerosis of native coronary artery with angina pectoris, unspecified whether native or transplanted heart ([place])   3. Congestive heart failure, unspecified HF chronicity, unspecified heart failure type ([place])   4. Hypertension, unspecified type   5. Pulmonary hypertension ([place])   6. OSA (obstructive sleep apnea)   7. Obesity, Class III, BMI 40-49.9 (morbid obesity) ([place])   8. Cerebrovascular accident (CVA), unspecified mechanism ([place])   9. Chronic anticoagulation   10. Chronic atrial fibrillation (HCC)   11. Right foot drop   12. Lumbar radiculopathy, chronic   13. Spinal stenosis, lumbar region, with neurogenic claudication    Plan:  2.  -Discussed with patient and his wife regarding changing to a new Cardiologist specialist in [place] when he is ready to transferred his care from his Cardiologist up [place]. Patient advised to call when he is ready   4.  -Discussed with patient and his wife and advised them to manually check his blood pressure intermittently and to keep a log and bring it in on next visit in 1 month.   6.  Advised the pt to continue utilizing his adjustable bed to aid with OSA.   7. Continue Weight Watchers in program.   11. -Discussed and recommended follow up with a Pain Management clinic, to which the patient agreed.   12.  -Will prescribe gabapentin to aid with lumbar radiculopathy.   Meds ordered this encounter  Medications  . gabapentin (NEURONTIN) 300 MG capsule    Sig: Take 1 capsule (300 mg total) by mouth 3 (three) times daily.    Dispense:  90 capsule    Refill:  3    Plan per patient's last visit on 10/08/2016:  All of patient's chronic past medical history reviewed with him. Entire history came from patient. Asked him to please get me his medical records from his physicians in [place].   We'll obtain fasting labs today since patient is fasting and has no labs with him/ med records.   We'll also obtain baseline BNP to assess patient's CHF status- since patient has no active symptoms and currently is stable.   Patient will continue with all his specialists up in [place] as this is what he prefers- will be 50-50 split btwn here and [place]- but prefers to keep them as primary since they have known him so long.   Spinal stenosis, lumbar region, with neurogenic claudication  Chronic pain meds- pt has taken lortab daily for many yrs.  ( at least 2yrs now) of the 10-325mg .   Has enough pills for now.    - told patient in the future if he needs refills on his pain meds, I will give them to him one time per yr or so (PCP can give it to him another time) , but if this is a chronic deal-  pt will need to be referred to a chronic pain management specialist.    Obesity, Class III, BMI 40-49.9 (morbid obesity) ([place])  - Counseled patient on pathophysiology of disease and discussed various treatment options, which often includes dietary and lifestyle modifications as first line.    - Anticipatory guidance given.     - Encouraged to return to clinic or call the office with any further questions or concerns.    A-fib- couple yrs now.  Stable asymptomatic,  regular rate and rhythm today.    Coronary arteriosclerosis- med mgt  Told patient to get me old records on his  atherosclerotic cardiovascular disease history including any cardiologist, neurologist etc. you have seen    - not sure why he is NOT on statin as pt declines allergy/ s-e    Chronic anticoagulation  Gets Eliquis from his cardiologist. Stable    Hypertension  Well controlled at 117/62.    meds per his Cards doc in [place] changes such as dash diet and engaging in a regular exercise program discussed with patient.  Educational handouts provided    Ambulatory  BP monitoring encouraged. Keep log and bring in next OV    Continue current medication(s).     Contact us prior with any Q's/ concerns.    h/o Stroke  11 yrs ago-   Found incidentally on MRI 2003 or so--> saw scar on MRI  -> PT doesn't recall having any sx.     CHF (congestive heart failure) (HCC)  Sleeps at 30 angle of the constantly. No increased shortness of breath or symptoms currently    - monitor wt regularly    - bmp near future     OSA (obstructive sleep apnea)  Bi-PAP nitely- been 20+ yrs now    Flu vaccine given today}   Orders Placed This Encounter  Procedures  . Comprehensive metabolic panel  . CBC with Differential/Platelet  . Hemoglobin A1c  . Lipid panel  . Magnesium  . Phosphorus  . TSH  . T4, free  . VITAMIN D 25 Hydroxy (Vit-D Deficiency, Fractures)  . Microalbumin / creatinine urine ratio     Return in about 4 weeks (around 11/19/2017) for Recheck BP-bring in log from home and started neurontin.  The patient was counseled, risk factors were discussed, anticipatory guidance given.  Gross side effects, risk and benefits, and alternatives of medications discussed with patient.  Patient is aware that all medications have potential side effects and we are unable to predict every side effect or drug-drug interaction that may occur.  Expresses verbal understanding and consents to current therapy plan and treatment regimen.  Please see AVS handed out to patient at the end of our visit for further patient instructions/ counseling done pertaining to today's office visit.    Note: This document was prepared using Dragon voice recognition software and may include unintentional dictation errors.   This document serves as a record of services personally performed by Mellody Dance, MD. It was created on her behalf by Steva Colder, a trained medical scribe. The creation of this record is based on the scribe's personal observations and  the provider's statements to them.   I have reviewed the above documentation for accuracy and completeness, and I agree with the above.   Mellody Dance 10/31/17 2:26 PM  ----------------------------------------------------------------------------------------------------------------------    Subjective:    Chief Complaint  Patient presents with  . Follow-up    HPI: He reports that he has lost over 80 lbs since his last visit to the office. When he was last evaluated by his cardiologist up [facility], he was taken off some medications due to losing weight. He is accompanied by his wife Altha Harm today. He is doing well overall.   Health Maintenance: -He saw Dr. Tamala Julian in July 2018 with an annual ECG. He was taken off his hydralazine and felodipine prescriptions.   -He reports that he will decrease his visits with his Specialists up [place] and add more  visits with his providers in [place].   -He spends 5 months in [place] and then the remaining 7 in [place].   HTN:  -His at home blood pressure readings are typically 267-124 systolic and 58-09 diastolic. He is compliant with his medications.   Obesity:  -Started Weight Watchers in March 2018 and lost over 65 lbs.   -He decided to change his nutrition habits due to age and his conversation during the last OV.  -He reports that his goal weight is 210 lbs.   -He is not exercising more due to other obligations.   Intermittent palpitations.   No dizziness, SOB, DOE, GI symptoms, bowel issues, cough, or wheeze. He denies CHF symptoms.   Spinal stenosis:  -He notes that his right foot drags when he is walking and his toes curl in and he has calluses on end of toes.  -He had a spinal specialist to look at his back and surgery was recommended, to which he declined.     HPI, per last OV on 10/08/2016  Joel Dominguez is a pleasant 66 y.o. male who presents to [place] at [facility] today to review their medical history with me and  establish care.   I asked the patient to review their chronic problem list with me to ensure everything was updated and accurate.     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  Beagle- angel- at home also  grandchild  2 here and 1 in [place].   Works now as [employer].  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     [place]  Cardiology Dr Tamala Julian in [place],       Patient Care Team    Relationship Specialty Notifications Start End  Mellody Dance, DO PCP - General Family Medicine  10/08/16      Wt Readings from Last 3 Encounters:  10/22/17 300 lb (136.1 kg)  10/08/16 (!) 349 lb 12.8 oz (158.7 kg)   BP Readings from Last 3 Encounters:  10/22/17 (!) 142/86  10/08/16 117/62   Pulse Readings from Last 3 Encounters:  10/22/17 72  10/08/16 68   BMI Readings from Last 3 Encounters:  10/22/17 40.69 kg/m  10/08/16 47.44 kg/m   Lab Results  Component Value Date   HGBA1C 5.6 10/22/2017   HGBA1C 5.3 03/11/2017   HGBA1C 5.8 (H) 10/08/2016    Patient Active Problem List   Diagnosis Date Noted  . Hypertension 10/08/2016    Priority: High  . OSA (obstructive sleep apnea) 10/08/2016    Priority: High  . Pulmonary hypertension ([place]) 10/08/2016    Priority: High  . Obesity, Class III, BMI 40-49.9 (morbid obesity) ([place]) 10/08/2016    Priority: High  . h/o Stroke 10/08/2016    Priority: Medium  . Atherosclerotic heart disease of native coronary artery with unspecified angina pectoris ([place]) 10/08/2016    Priority: Medium  . CHF (congestive heart failure) ([place]) 10/08/2016    Priority: Medium  . A-fib- couple yrs now. 10/08/2016    Priority: Medium  . Chronic anticoagulation 10/08/2016    Priority: Medium  . Generalized OA 10/08/2016    Priority: Low  . Foot drop- R 10/08/2016    Priority: Low  . Spinal stenosis, lumbar region, with neurogenic claudication 10/08/2016    Priority: Low  . H/O noncompliance with  medical treatment, presenting hazards to health 10/22/2017     Past Medical History:  Diagnosis Date  .  CHF (congestive heart failure) ([place])   . Heart disease   . Hypertension   . Stroke [facility])      Past Surgical History:  Procedure Laterality Date  . REPLACEMENT TOTAL KNEE Bilateral   . TIBIA FRACTURE SURGERY       Family History  Problem Relation Age of Onset  . Congestive Heart Failure Mother   . Hypertension Mother   . Diabetes Mother   . Cancer Father        lung  . Congestive Heart Failure Sister   . Hypertension Sister   . Hypertension Brother   . Hypertension Sister   . Diabetes Brother      Social History   Substance and Sexual Activity  Drug Use No    Social History   Substance and Sexual Activity  Alcohol Use Yes   Comment: once monthly    Social History   Tobacco Use  Smoking Status Never Smoker  Smokeless Tobacco Never Used      Medication List        Accurate as of 10/22/17 11:59 PM. Always use your most recent med list.          bumetanide 2 MG tablet Commonly known as:  BUMEX   carvedilol 25 MG tablet Commonly known as:  COREG   ELIQUIS 5 MG Tabs tablet Generic drug:  apixaban   gabapentin 300 MG capsule Commonly known as:  NEURONTIN Take 1 capsule (300 mg total) by mouth 3 (three) times daily.   HYDROcodone-acetaminophen 10-325 MG tablet Commonly known as:  NORCO   losartan 100 MG tablet Commonly known as:  COZAAR   multivitamin tablet   potassium chloride 10 MEQ tablet Commonly known as:  K-DUR,KLOR-CON       Where to Get Your Medications    These medications were sent to [place] ([place]), [place] [address]  [address], [place] ([place]) [place] [address]   Phone:  [phone]   gabapentin 300 MG capsule     Allergies: Patient has no known allergies.  Review of Systems  Constitutional: Negative.  Negative for chills, diaphoresis, fever, malaise/fatigue and weight  loss.  HENT: Positive for tinnitus. Negative for congestion and sore throat.        Chronic-stable tinnitus  Eyes: Negative.  Negative for blurred vision, double vision and photophobia.  Respiratory: Negative for cough and wheezing.   Cardiovascular: Positive for palpitations (intermittent). Negative for chest pain.  Gastrointestinal: Negative.  Negative for blood in stool, diarrhea, nausea and vomiting.  Genitourinary: Negative.  Negative for dysuria, frequency and urgency.  Musculoskeletal: Positive for joint pain. Negative for myalgias.       Bilateral knee replacements, other joint pains due to osteoarthritis  Skin: Negative.  Negative for itching and rash.  Neurological: Negative.  Negative for dizziness, focal weakness, weakness and headaches.  Endo/Heme/Allergies: Negative for environmental allergies and polydipsia. Bruises/bleeds easily.       On chronic anticoagulation  Psychiatric/Behavioral: Negative.  Negative for depression and memory loss. The patient is not nervous/anxious and does not have insomnia.      Objective:   Blood pressure (!) 142/86, pulse 72, height 6' (1.829 m), weight 300 lb (136.1 kg), SpO2 99 %. Body mass index is 40.69 kg/m. General: Well Developed, well nourished, and in no acute distress.  Neuro: Alert and oriented x3, extra-ocular muscles intact, sensation grossly intact.  HEENT: Normocephalic, atraumatic, pupils equal round reactive to light, neck supple, no bruits  Skin: no gross suspicious lesions or rashes  Cardiac: Regular rate and rhythm- no irregular heartbeat appreciated, no murmurs rubs or gallops.  Respiratory: Essentially clear to auscultation bilaterally. Not using accessory muscles, speaking in full sentences.  Abdominal:obese Musculoskeletal: Ambulates w/o diff, FROM * 4 ext.  Vasc: less 2 sec cap RF, warm and pink  Psych:  No HI/SI, judgement and insight good, Euthymic mood. Full Affect.

## 2017-10-22 NOTE — Patient Instructions (Addendum)
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  Check it occasionally-daily or every other day-and keep a log and write it down and bring in next office visit.      For the Neurontin, this is a medicine that can potentially cause side effects and we will need to go up on the dose slowly over time.  - Start off with taking one Neurontin cap every evening for 3 days, if well tolerated then-  - take 1 cap twice daily for 3 days, if still well tolerated then-   - take 1 cap 3 times daily for 3 days, if tolerated then-   - take 1 in the morning, 1 in the afternoon and 2 caps at night for 3 days, if well tolerated-  (such as:  0,0,1 * 3 d 0,1,1 * 3 days,  1,1,1, * 3 days 1,1,2 * 3 days 1,2,2 * 3 days)   - take 1 in the morning,  2 in the afternoon, and 2 at night for 3 days and then slowly work your way up to 2 caps 3 times a day, then   - take 2, 2, 3 caps * 3 days etc.    We will go up on the dose and give you a new prescription once you are running low ( down to about 1 week of pills).

## 2017-10-23 LAB — CBC WITH DIFFERENTIAL/PLATELET
BASOS ABS: 0 10*3/uL (ref 0.0–0.2)
Basos: 1 %
EOS (ABSOLUTE): 0.2 10*3/uL (ref 0.0–0.4)
EOS: 3 %
HEMOGLOBIN: 14.1 g/dL (ref 13.0–17.7)
Hematocrit: 39.8 % (ref 37.5–51.0)
IMMATURE GRANS (ABS): 0 10*3/uL (ref 0.0–0.1)
IMMATURE GRANULOCYTES: 0 %
LYMPHS: 18 %
Lymphocytes Absolute: 1.3 10*3/uL (ref 0.7–3.1)
MCH: 28.8 pg (ref 26.6–33.0)
MCHC: 35.4 g/dL (ref 31.5–35.7)
MCV: 81 fL (ref 79–97)
MONOCYTES: 9 %
Monocytes Absolute: 0.7 10*3/uL (ref 0.1–0.9)
NEUTROS PCT: 69 %
Neutrophils Absolute: 4.9 10*3/uL (ref 1.4–7.0)
Platelets: 249 10*3/uL (ref 150–379)
RBC: 4.9 x10E6/uL (ref 4.14–5.80)
RDW: 14.1 % (ref 12.3–15.4)
WBC: 7.1 10*3/uL (ref 3.4–10.8)

## 2017-10-23 LAB — COMPREHENSIVE METABOLIC PANEL
A/G RATIO: 1.3 (ref 1.2–2.2)
ALK PHOS: 98 IU/L (ref 39–117)
ALT: 13 IU/L (ref 0–44)
AST: 19 IU/L (ref 0–40)
Albumin: 4.3 g/dL (ref 3.6–4.8)
BILIRUBIN TOTAL: 0.8 mg/dL (ref 0.0–1.2)
BUN / CREAT RATIO: 13 (ref 10–24)
BUN: 12 mg/dL (ref 8–27)
CHLORIDE: 99 mmol/L (ref 96–106)
CO2: 27 mmol/L (ref 20–29)
Calcium: 9.2 mg/dL (ref 8.6–10.2)
Creatinine, Ser: 0.94 mg/dL (ref 0.76–1.27)
GFR calc non Af Amer: 84 mL/min/{1.73_m2} (ref >59)
GFR, EST AFRICAN AMERICAN: 97 mL/min/{1.73_m2} (ref >59)
GLUCOSE: 95 mg/dL (ref 65–99)
Globulin, Total: 3.4 g/dL (ref 1.5–4.5)
POTASSIUM: 3.6 mmol/L (ref 3.5–5.2)
Sodium: 141 mmol/L (ref 134–144)
TOTAL PROTEIN: 7.7 g/dL (ref 6.0–8.5)

## 2017-10-23 LAB — LIPID PANEL
CHOLESTEROL TOTAL: 161 mg/dL (ref 100–199)
Chol/HDL Ratio: 3.7 ratio (ref 0.0–5.0)
HDL: 43 mg/dL (ref >39)
LDL Calculated: 92 mg/dL (ref 0–99)
TRIGLYCERIDES: 129 mg/dL (ref 0–149)
VLDL Cholesterol Cal: 26 mg/dL (ref 5–40)

## 2017-10-23 LAB — HEMOGLOBIN A1C
ESTIMATED AVERAGE GLUCOSE: 114 mg/dL
HEMOGLOBIN A1C: 5.6 % (ref 4.8–5.6)

## 2017-10-23 LAB — VITAMIN D 25 HYDROXY (VIT D DEFICIENCY, FRACTURES): VIT D 25 HYDROXY: 34.4 ng/mL (ref 30.0–100.0)

## 2017-10-23 LAB — PHOSPHORUS: Phosphorus: 3.5 mg/dL (ref 2.5–4.5)

## 2017-10-23 LAB — T4, FREE: FREE T4: 1.15 ng/dL (ref 0.82–1.77)

## 2017-10-23 LAB — MICROALBUMIN / CREATININE URINE RATIO
CREATININE, UR: 134.3 mg/dL
MICROALB/CREAT RATIO: 86.4 mg/g{creat} — AB (ref 0.0–30.0)
Microalbumin, Urine: 116 ug/mL

## 2017-10-23 LAB — TSH: TSH: 0.745 u[IU]/mL (ref 0.450–4.500)

## 2017-10-23 LAB — MAGNESIUM: MAGNESIUM: 2 mg/dL (ref 1.6–2.3)

## 2017-10-31 ENCOUNTER — Encounter: Payer: Self-pay | Admitting: Family Medicine

## 2017-11-06 ENCOUNTER — Telehealth: Payer: Self-pay | Admitting: Family Medicine

## 2017-11-06 DIAGNOSIS — I10 Essential (primary) hypertension: Secondary | ICD-10-CM | POA: Insufficient documentation

## 2017-11-06 DIAGNOSIS — I350 Nonrheumatic aortic (valve) stenosis: Secondary | ICD-10-CM

## 2017-11-06 HISTORY — DX: Nonrheumatic aortic (valve) stenosis: I35.0

## 2017-11-06 NOTE — Telephone Encounter (Signed)
[place] fax# [phone]

## 2017-11-06 NOTE — Telephone Encounter (Signed)
Patient called states when here 12/19 provider referred him to pain clinic , he has an appointment on Monday 1/7 w/ Dr Maryruth Eve @ [facility] [phone]) but that provider is requesting a referral & Office notes-- advised would forward message to Dr. Raliegh Scarlet to enter referral & we would fax all information.  --glh

## 2017-11-07 ENCOUNTER — Other Ambulatory Visit: Payer: Self-pay

## 2017-11-07 DIAGNOSIS — M48062 Spinal stenosis, lumbar region with neurogenic claudication: Secondary | ICD-10-CM

## 2017-11-07 DIAGNOSIS — I452 Bifascicular block: Secondary | ICD-10-CM

## 2017-11-07 HISTORY — DX: Bifascicular block: I45.2

## 2017-11-07 NOTE — Progress Notes (Signed)
Cardiology Office Note:    Date:  11/10/2017   ID:  Joel Dominguez, DOB 1951-01-31, MRN [id number]  PCP:  Mellody Dance, DO  Cardiologist:  Shirlee More, MD   Referring MD: Mellody Dance, DO  ASSESSMENT:    1. Chronic atrial fibrillation ([place])   2. Mild aortic stenosis   3. Chronic combined systolic and diastolic heart failure ([place])   4. 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 fascicular block)   5. Hypertensive heart disease with heart failure ([place])   6. Chronic anticoagulation   7. Pulmonary hypertension ([place])    PLAN:    In order of problems listed above:  1. Stable rate controlled continue his current diuretic and anticoagulant.  If unable to be on long-term anticoagulation referral for watchman device would be appropriate 2. Stable no murmur on examination plan repeat echocardiogram in 2 years 3. Stable compensators at this time I do not think he requires an echocardiogram for ejection fraction and continue his current treatment with loop diuretic proximal diabetic diuretic beta-blocker and ARB. 4. Stable pattern on EKG 5. Heart failure is compensated hypertension is out of range he will switch to a more potent ARB and will drop off a list of blood pressures in 2 weeks.  If not on target I will start her on vasodilator therapy with hydralazine and isosorbide. 6. Stable continue his anticoagulant no bleeding complication 7. Clinically most recent echocardiogram shows mild pulmonary hypertension likely related to heart failure and sleep apnea and I do not think he requires an invasive evaluation referral for therapy for pulmonary hypertension.  Next appointment 6 weeks   Medication Adjustments/Labs and Tests Ordered: Current medicines are reviewed at length with the patient today.  Concerns regarding medicines are outlined above.  Orders Placed This Encounter  Procedures  . EKG 12-Lead   Meds ordered this encounter  Medications  . telmisartan (MICARDIS) 80  MG tablet    Sig: Take 1 tablet (80 mg total) by mouth daily.    Dispense:  60 tablet    Refill:  3     Chief Complaint  Patient presents with  . Hypertension  . Establish Care  . Atrial Fibrillation  . Aortic Stenosis  . Congestive Heart Failure    History of Present Illness:    Joel Dominguez is a 67 y.o. male who is being seen today to establish cardiology care for  heart failure, hypertension, aortic stenosis and chronic atrial fibrillation at the request of Mellody Dance, DO.  Echo 08/09/15: EF estimated 35 to 40-45% biatrial enlargement and mild AS peak 20 mm hg, PA mildly elevated 40-45 mm hg EF noted in previous records at 50% in October 2017. His predominant concern is his blood pressure at home has been greater than 500 systolic which is new and he requested evaluation in my office.  He sodium restricts is compliant with his medications including diuretics. He has chronic atrial fibrillation and has been anticoagulated with a history of remote stroke.  He has had no bleeding complications is compliant and has not reviewed no recurrent TIA or neurologic event. He has a history of heart failure and takes both his loop diuretic and about twice a month a dose of metolazone when he has weight gain and increased edema.  He is compliant with sodium restriction and has purposely lost 80 pounds in the last year and his home weight is stable in the range of 295.  He is having no orthopnea or exertional shortness of  breath. He is also on CPAP for obstructive sleep apnea. He has mild pulmonary hypertension and he was unaware but has minimal aortic stenosis. His chart relates CAD but he tells me he is never had that diagnosis and is never had a heart catheterization performed. His last echocardiogram was May 2017 He no longer travels back and forth to [facility] requested local cardiology care  Past Medical History:  Diagnosis Date  . A-fib- couple yrs now. 10/08/2016  . Aortic stenosis  11/06/2017  . Atherosclerotic heart disease of native coronary artery with unspecified angina pectoris ([place]) 10/08/2016  . CHF (congestive heart failure) ([place])   . Chronic anticoagulation 10/08/2016  . Foot drop- R 10/08/2016   Patient known foot drop for many years due to lumbar spinal stenosis.-Over the past couple of months symptoms have gotten worse.  He feels he is tripping more and cannot lift his foot up as much. Toes are curling and calluses on end of toes.    - Did see spinal surgeon many yrs ago.  Declined sx at that time.     . Generalized OA 10/08/2016   Ortho doc-  Dr. [facility] in [place]- both knees replaced 08, 09.     . H/O noncompliance with medical treatment, presenting hazards to health 10/22/2017  . Heart disease   . Hypertension   . Obesity, Class III, BMI 40-49.9 (morbid obesity) ([place]) 10/08/2016  . OSA (obstructive sleep apnea) 10/08/2016   Bi-PAP nitely- been 20+ yrs now  . Pulmonary hypertension ([place]) 10/08/2016  . Spinal stenosis, lumbar region, with neurogenic claudication 10/08/2016   Back specialist in [place]-    only surgeon he would use- pt declined referral here for back pain mgt    . Stroke [facility])     Past Surgical History:  Procedure Laterality Date  . REPLACEMENT TOTAL KNEE Bilateral   . TIBIA FRACTURE SURGERY      Current Medications: Current Meds  Medication Sig  . apixaban (ELIQUIS) 5 MG TABS tablet Take 5 mg by mouth 2 (two) times daily.  . bumetanide (BUMEX) 2 MG tablet Take 2 mg by mouth 2 (two) times daily.  . carvedilol (COREG) 25 MG tablet Take 25 mg by mouth 2 (two) times daily with a meal.  . gabapentin (NEURONTIN) 300 MG capsule Take 1 capsule (300 mg total) by mouth 3 (three) times daily.  Marland Kitchen HYDROcodone-acetaminophen (NORCO) 10-325 MG tablet Take 1 tablet by mouth as needed.  . Multiple Vitamin (MULTIVITAMIN) tablet Take 1 tablet by mouth daily.  . potassium chloride (K-DUR,KLOR-CON) 10 MEQ tablet Take 20 mEq by mouth 2 (two) times daily.  .  [DISCONTINUED] losartan (COZAAR) 100 MG tablet Take 100 mg by mouth daily.     Allergies:   Patient has no known allergies.   Social History   Socioeconomic History  . Marital status: Married    Spouse name: None  . Number of children: None  . Years of education: None  . Highest education level: None  Social Needs  . Financial resource strain: None  . Food insecurity - worry: None  . Food insecurity - inability: None  . Transportation needs - medical: None  . Transportation needs - non-medical: None  Occupational History  . None  Tobacco Use  . Smoking status: Never Smoker  . Smokeless tobacco: Never Used  Substance and Sexual Activity  . Alcohol use: Yes    Comment: once monthly  . Drug use: No  . Sexual activity: No  Other Topics  Concern  . None  Social History Narrative  . None     Family History: The patient's family history includes Cancer in his father; Congestive Heart Failure in his mother and sister; Diabetes in his brother and mother; Hypertension in his brother, mother, sister, and sister.  ROS:   Review of Systems  Constitution: Positive for weight loss (80 lbs purposeful).  HENT: Negative.   Eyes: Negative.   Cardiovascular: Positive for leg swelling. Negative for chest pain, claudication, cyanosis, dyspnea on exertion, irregular heartbeat, near-syncope, orthopnea, palpitations and paroxysmal nocturnal dyspnea.  Respiratory: Positive for shortness of breath (exertional) and snoring (on CPAP).   Endocrine: Negative.   Hematologic/Lymphatic: Negative.   Skin: Negative.   Musculoskeletal: Positive for back pain, joint pain and myalgias.  Gastrointestinal: Negative.   Genitourinary: Negative.   Neurological: Negative.   Psychiatric/Behavioral: Negative.   Allergic/Immunologic: Negative.    Please see the history of present illness.   All other systems reviewed and are negative.  EKGs/Labs/Other Studies Reviewed:     EKG:  EKG is  ordered today.   The ekg ordered today demonstrates rate controlled AF RBBB LAHB and unchanged from previous  Recent Labs: 10/22/2017: ALT 13; BUN 12; Creatinine, Ser 0.94; Hemoglobin 14.1; Magnesium 2.0; Platelets 249; Potassium 3.6; Sodium 141; TSH 0.745  Recent Lipid Panel    Component Value Date/Time   CHOL 161 10/22/2017 1608   TRIG 129 10/22/2017 1608   HDL 43 10/22/2017 1608   CHOLHDL 3.7 10/22/2017 1608   CHOLHDL 4.3 10/08/2016 1144   VLDL 45 (H) 10/08/2016 1144   LDLCALC 92 10/22/2017 1608    Physical Exam:    VS:  BP 132/82 (BP Location: Right Arm, Patient Position: Sitting, Cuff Size: Large)   Pulse 63   Ht 6' (1.829 m)   Wt (!) 311 lb (141.1 kg)   SpO2 98%   BMI 42.18 kg/m     Wt Readings from Last 3 Encounters:  11/10/17 (!) 311 lb (141.1 kg)  10/22/17 300 lb (136.1 kg)  10/08/16 (!) 349 lb 12.8 oz (158.7 kg)     GEN:  Well nourished, well developed in no acute distress HEENT: Normal NECK: No JVD; No carotid bruits LYMPHATICS: No lymphadenopathy CARDIAC: Irr irr variable s1 no S3 or murmur RESPIRATORY:  Clear to auscultation without rales, wheezing or rhonchi  ABDOMEN: Soft, non-tender, non-distended MUSCULOSKELETAL:  1-2+ tibial  edema; No deformity  SKIN: Warm and dry NEUROLOGIC:  Alert and oriented x 3 PSYCHIATRIC:  Normal affect     Signed, Shirlee More, MD  11/10/2017 9:53 AM    [facility]

## 2017-11-07 NOTE — Telephone Encounter (Signed)
Referral put in for the patient and office notes faxed. MPulliam, CMA/RT(R)

## 2017-11-10 ENCOUNTER — Ambulatory Visit (INDEPENDENT_AMBULATORY_CARE_PROVIDER_SITE_OTHER): Payer: Medicare Other | Admitting: Cardiology

## 2017-11-10 ENCOUNTER — Telehealth: Payer: Self-pay | Admitting: Family Medicine

## 2017-11-10 ENCOUNTER — Encounter: Payer: Self-pay | Admitting: Cardiology

## 2017-11-10 VITALS — BP 132/82 | HR 63 | Ht 72.0 in | Wt 311.0 lb

## 2017-11-10 DIAGNOSIS — I452 Bifascicular block: Secondary | ICD-10-CM

## 2017-11-10 DIAGNOSIS — I482 Chronic atrial fibrillation, unspecified: Secondary | ICD-10-CM

## 2017-11-10 DIAGNOSIS — Z7901 Long term (current) use of anticoagulants: Secondary | ICD-10-CM

## 2017-11-10 DIAGNOSIS — I350 Nonrheumatic aortic (valve) stenosis: Secondary | ICD-10-CM | POA: Diagnosis not present

## 2017-11-10 DIAGNOSIS — I272 Pulmonary hypertension, unspecified: Secondary | ICD-10-CM

## 2017-11-10 DIAGNOSIS — I110 Hypertensive heart disease with heart failure: Secondary | ICD-10-CM | POA: Insufficient documentation

## 2017-11-10 DIAGNOSIS — I5042 Chronic combined systolic (congestive) and diastolic (congestive) heart failure: Secondary | ICD-10-CM

## 2017-11-10 HISTORY — DX: Hypertensive heart disease with heart failure: I11.0

## 2017-11-10 MED ORDER — TELMISARTAN 80 MG PO TABS: 80.0000 mg | ORAL_TABLET | Freq: Every day | ORAL | 3 refills | Status: DC

## 2017-11-10 NOTE — Patient Instructions (Addendum)
Medication Instructions:  Your physician has recommended you make the following change in your medication:  STOP losartan START telmisartan (Micardis) 80 mg daily  Labwork: None  Testing/Procedures: You had an EKG today.  Follow-Up: Your physician recommends that you schedule a follow-up appointment in: 6 weeks.  Call with home blood pressure in 2 weeks  Any Other Special Instructions Will Be Listed Below (If Applicable).     If you need a refill on your cardiac medications before your next appointment, please call your pharmacy.    Heart Failure  Weigh yourself every morning when you first wake up and record on a calender or note pad, bring this to your office visits. Using a pill tender can help with taking your medications consistently.  Limit your fluid intake to 2 liters daily  Limit your sodium intake to less than 2-3 grams daily. Ask if you need dietary teaching.  If you gain more than 3 pounds (from your dry weight ), double your dose of diuretic for the day.  If you gain more than 5 pounds (from your dry weight), double your dose of lasix and call your heart failure doctor.  Please do not smoke tobacco since it is very bad for your heart.  Please do not drink alcohol since it can worsen your heart failure.Also avoid OTC nonsteroidal drugs, such as advil, aleve and motrin.  Try to exercise for at least 30 minutes every day because this will help your heart be more efficient. You may be eligible for supervised cardiac rehab, ask your physician.

## 2017-11-10 NOTE — Telephone Encounter (Signed)
Pt called states he need Rx refill on all medicines except the Losartan--- Pt states Provider increase dosage of gabapentin but, supply does not match his intake instructions & he needs more--advised would forward message to provider & medical assistant . --glh  1)  apixaban (ELIQUIS) 5 MG TABS tablet [[id number]]  Order Details  Dose: 5 mg Route: Oral Frequency: 2 times daily  Dispense Quantity: -- Refills: -- Fills remaining: --        Sig: Take 5 mg by mouth 2 (two) times daily.          2) bumetanide (BUMEX) 2 MG tablet [[id number]]  Order Details  Dose: 2 mg Route: Oral Frequency: 2 times daily  Dispense Quantity: -- Refills: -- Fills remaining: --        Sig: Take 2 mg by mouth 2 (two) times daily.     carvedilol (COREG) 25 MG tablet [[id number]]  Order Details  Dose: 25 mg Route: Oral Frequency: 2 times daily with meals  Dispense Quantity: -- Refills: -- Fills remaining: --        Sig: Take 25 mg by mouth 2 (two) times daily with a meal.          3)carvedilol (COREG) 25 MG tablet [[id number]]  Order Details  Dose: 25 mg Route: Oral Frequency: 2 times daily with meals  Dispense Quantity: -- Refills: -- Fills remaining: --        Sig: Take 25 mg by mouth 2 (two) times daily with a meal.         4) HYDROcodone-acetaminophen (NORCO) 10-325 MG tablet [[id number]]  Order Details  Dose: 1 tablet Route: Oral Frequency: As needed  Dispense Quantity: -- Refills: -- Fills remaining: --        Sig: Take 1 tablet by mouth as needed.     5) Multiple Vitamin (MULTIVITAMIN) tablet [[id number]]  Order Details  Dose: 1 tablet Route: Oral Frequency: Daily  Dispense Quantity: -- Refills: -- Fills remaining: --        Sig: Take 1 tablet by mouth daily. 6)potassium chloride (K-DUR,KLOR-CON) 10 MEQ tablet [[id number]]  Order Details  Dose: 20 mEq Route: Oral Frequency: 2 times daily  Dispense Quantity: -- Refills: -- Fills remaining: --        Sig: Take 20 mEq by mouth 2  (two) times daily.     7)     gabapentin (NEURONTIN) 300 MG capsule [[id number]]  Order Details  Dose: 300 mg Route: Oral Frequency: 3 times daily  Dispense Quantity: 90 capsule Refills: 3 Fills remaining: --        Sig: Take 1 capsule (300 mg total) by mouth 3 (three) times daily.     Pt now wants all Rx sent to : [facility]      [place], [place] - [place] [phone] (Phone) [phone] (Fax)   ---glh

## 2017-11-11 ENCOUNTER — Other Ambulatory Visit: Payer: Self-pay

## 2017-11-11 DIAGNOSIS — I509 Heart failure, unspecified: Secondary | ICD-10-CM

## 2017-11-11 DIAGNOSIS — I25119 Atherosclerotic heart disease of native coronary artery with unspecified angina pectoris: Secondary | ICD-10-CM

## 2017-11-11 DIAGNOSIS — M5416 Radiculopathy, lumbar region: Secondary | ICD-10-CM

## 2017-11-11 DIAGNOSIS — I10 Essential (primary) hypertension: Secondary | ICD-10-CM

## 2017-11-11 DIAGNOSIS — I639 Cerebral infarction, unspecified: Secondary | ICD-10-CM

## 2017-11-11 DIAGNOSIS — I272 Pulmonary hypertension, unspecified: Secondary | ICD-10-CM

## 2017-11-11 DIAGNOSIS — I482 Chronic atrial fibrillation, unspecified: Secondary | ICD-10-CM

## 2017-11-11 DIAGNOSIS — M21371 Foot drop, right foot: Secondary | ICD-10-CM

## 2017-11-11 DIAGNOSIS — Z7901 Long term (current) use of anticoagulants: Secondary | ICD-10-CM

## 2017-11-11 NOTE — Telephone Encounter (Signed)
Medications last filled by a previous provider, sent to Dr. Raliegh Scarlet to review. MPulliam, CMA/RT(R)

## 2017-11-11 NOTE — Telephone Encounter (Addendum)
Patient called requesting a refill on his Potassium, multivitamin, hydrocodone, carvedilol, bumetanide, eliquis. Medications were last filled by a previous provider, request sent to Dr. Raliegh Scarlet to review. Patient's last office visit was 12/23/2016.  MPulliam, CMA/RT(R)

## 2017-11-12 ENCOUNTER — Other Ambulatory Visit: Payer: Self-pay

## 2017-11-12 ENCOUNTER — Telehealth: Payer: Self-pay | Admitting: Family Medicine

## 2017-11-12 DIAGNOSIS — M21371 Foot drop, right foot: Secondary | ICD-10-CM

## 2017-11-12 DIAGNOSIS — I25119 Atherosclerotic heart disease of native coronary artery with unspecified angina pectoris: Secondary | ICD-10-CM

## 2017-11-12 DIAGNOSIS — I509 Heart failure, unspecified: Secondary | ICD-10-CM

## 2017-11-12 DIAGNOSIS — I10 Essential (primary) hypertension: Secondary | ICD-10-CM

## 2017-11-12 DIAGNOSIS — I482 Chronic atrial fibrillation, unspecified: Secondary | ICD-10-CM

## 2017-11-12 DIAGNOSIS — I272 Pulmonary hypertension, unspecified: Secondary | ICD-10-CM

## 2017-11-12 DIAGNOSIS — Z7901 Long term (current) use of anticoagulants: Secondary | ICD-10-CM

## 2017-11-12 DIAGNOSIS — M5416 Radiculopathy, lumbar region: Secondary | ICD-10-CM

## 2017-11-12 DIAGNOSIS — I639 Cerebral infarction, unspecified: Secondary | ICD-10-CM

## 2017-11-12 MED ORDER — GABAPENTIN 300 MG PO CAPS: 900.0000 mg | ORAL_CAPSULE | Freq: Three times a day (TID) | ORAL | 1 refills | Status: DC

## 2017-11-12 NOTE — Telephone Encounter (Signed)
Pt called again states he is out of Rx and really needs :  gabapentin (NEURONTIN) 300 MG capsule [[id number]]  Order Details  Dose: 300 mg Route: Oral Frequency: 3 times daily  Dispense Quantity: 90 capsule Refills: 3 Fills remaining: --        Sig: Take 1 capsule (300 mg total) by mouth 3 (three) times daily.          Confesses had been taking wife's med since he is out. Pls call  Approval for Rx refill to [facility].  --glh

## 2017-11-12 NOTE — Telephone Encounter (Signed)
Called patient and notified, please see other note. MPulliam, CMA/RT(R)

## 2017-11-12 NOTE — Telephone Encounter (Signed)
Pt has specialists... Needs to get from him/her- will need to get bumex, eliquis from them  I do not do chronic pain mgt as well.

## 2017-11-12 NOTE — Telephone Encounter (Signed)
Called the patient to verify dose that he is currently taking and if he is tolerating it well.  Also needed to find out if the pharmacy refilled the medication because he was given 3 refills, but directions for medication is different then directions on the RX sent in on 10/22/2017.  Also to inform the patient per Dr. Hershal Coria other note on refills that he should be getting refills through his specialist.  Patient was upset and would not let me talk or explain what I was calling for.  He states that he does not have any specialist and has just went to the Cardiologist that she referred him to for the first time.  Patient's pervious doctor's are up [place].  Patient wants to talk to Dr. Raliegh Scarlet and he does not understand why she doesn't call him.  I tried to explain that I call to try to get as much done for the patients as possible while she is seeing patients. Patient very upset.

## 2017-11-12 NOTE — Telephone Encounter (Signed)
Called [facility] to cancel out the refills on Gabapentin.  Called patient and verified how much he is currently taking and that he is tolerating it well.  Patient is taking 3 caps 3 times a day and is tolerating well.  New Rx for this medication sent to [facility].  Patient would still like to talk to Dr. Raliegh Scarlet on the phone and states that any time is good for her to call. I sent this message to her. MPulliam, CMA/RT(R)

## 2017-11-13 NOTE — Telephone Encounter (Signed)
Called patient at his home phone and his wife Joel Dominguez answered.  Spoke with Joel Dominguez for approximately 15 minutes regarding his medications and his concerns.    Last office visit we had a long discussion about same exact issue and concerns in room 2 and we determined that since his cardiologist he sees every 6 months and they have the ones making changes to his medication, that it would be best getting the cardiac meds from his heart doctor.  That is why last office visit we referred him to a cardiologist.  He had just seen the cardiologist early January but unfortunately failed to ask for refills of his cardiac meds.  He is upset with me because we will not refill his cardiac meds now.  He tells me up in [place] his cardiologist would make changes, then communicate that to his PCP and the PCP would then enter fill what the cardiologist said was appropriate.  I told patient I do not feel this is the best way for him to be managed as medication errors can occur this way.  Also, when he is being currently managed by cardiologist with various tests etc., but there is nobody better to be filling these medicines than the specialist themselves.  -Also last office visit we also had a discussion that I do not do chronic pain management this is why as well I have referred him to pain management.  He was upset because he had not heard from them yet and I explained that this can sometimes be a process.  I explained to him all of the paperwork was with them that needed to be.   -Patient tells me he has a follow-up with me in the near future and he would like to go over medications and discuss this again.

## 2017-11-17 ENCOUNTER — Telehealth: Payer: Self-pay | Admitting: Cardiology

## 2017-11-17 NOTE — Telephone Encounter (Signed)
Joel Dominguez called to say his insurance is not going to pay for the new medication and what else should he take?

## 2017-11-17 NOTE — Telephone Encounter (Signed)
Insurance paid for telmisartan this month and next month, could challenge that he really needs it. Wants to know if he can switch to a different medication or if he should challenge that he needs telmisartan. Please advise.

## 2017-11-18 NOTE — Telephone Encounter (Signed)
Stay on telmisartin

## 2017-11-18 NOTE — Telephone Encounter (Signed)
Informed patient to continue with his telmisartan; he is agreeable. Will call his pharmacy per the patient's request.

## 2017-11-19 ENCOUNTER — Ambulatory Visit (INDEPENDENT_AMBULATORY_CARE_PROVIDER_SITE_OTHER): Payer: Medicare Other | Admitting: Family Medicine

## 2017-11-19 ENCOUNTER — Encounter: Payer: Self-pay | Admitting: Family Medicine

## 2017-11-19 VITALS — BP 138/86 | HR 82 | Ht 72.0 in | Wt 304.0 lb

## 2017-11-19 DIAGNOSIS — M48061 Spinal stenosis, lumbar region without neurogenic claudication: Secondary | ICD-10-CM | POA: Diagnosis not present

## 2017-11-19 DIAGNOSIS — I272 Pulmonary hypertension, unspecified: Secondary | ICD-10-CM

## 2017-11-19 DIAGNOSIS — R809 Proteinuria, unspecified: Secondary | ICD-10-CM | POA: Diagnosis not present

## 2017-11-19 DIAGNOSIS — E559 Vitamin D deficiency, unspecified: Secondary | ICD-10-CM | POA: Insufficient documentation

## 2017-11-19 DIAGNOSIS — I25119 Atherosclerotic heart disease of native coronary artery with unspecified angina pectoris: Secondary | ICD-10-CM | POA: Diagnosis not present

## 2017-11-19 DIAGNOSIS — M1990 Unspecified osteoarthritis, unspecified site: Secondary | ICD-10-CM | POA: Insufficient documentation

## 2017-11-19 HISTORY — DX: Unspecified osteoarthritis, unspecified site: M19.90

## 2017-11-19 HISTORY — DX: Proteinuria, unspecified: R80.9

## 2017-11-19 HISTORY — DX: Vitamin D deficiency, unspecified: E55.9

## 2017-11-19 MED ORDER — VITAMIN D (ERGOCALCIFEROL) 1.25 MG (50000 UNIT) PO CAPS: 50000.0000 [IU] | ORAL_CAPSULE | ORAL | 10 refills | Status: DC

## 2017-11-19 NOTE — Patient Instructions (Addendum)
Vit D, microalbuminuria- reck 62mo  - The quick and dirty--> lower triglyceride levels more through...  1) - Beware of bad fats: Cutting back on saturated fat (in red meat and full-fat dairy foods) and trans fats (in restaurant fried foods and commercially prepared baked goods) can lower triglycerides.  2) - Go for good carbs: Easily digested carbohydrates (such as white bread, white rice, cornflakes, and sugary sodas) give triglycerides a definite boost.   3) - Eating whole grains and cutting back on soda can help control triglycerides.  4) - Check your alcohol use. In some people, alcohol dramatically boosts triglycerides. The only way to know if this is true for you is to avoid alcohol for a few weeks and have your triglycerides tested again.  5) - Go fish. Omega-3 fats in salmon, tuna, sardines, and other fatty fish can lower triglycerides. Having fish twice a week is fine.  6) - Aim for a healthy weight. If you are overweight, losing just 5% to 10% of your weight can help drive down triglycerides.  7) - Get moving. Exercise lowers triglycerides and boosts heart-healthy HDL cholesterol.  8) - quit smoking if you do  --> for more information, see below; or go to  [email or web address]  and do a search for desired topics   For those diagnosed with high triglycerides, it's important to take action to lower your levels and improve your heart health.  Triglyceride is just a fancy word for fat - the fat in our bodies is stored in the form of triglycerides. Triglycerides are found in foods and manufactured in our bodies.  Normal triglyceride levels are defined as less than 150 mg/dL; 150 to 199 is considered borderline high; 200 to 499 is high; and 500 or higher is officially called very high. To me, anything over 150 is a red flag indicating my patient needs to take immediate steps to get the situation under control.   What is the significance of high triglycerides? High triglyceride levels make  blood thicker and stickier, which means that it is more likely to form clots. Studies have shown that triglyceride levels are associated with increased risks of cardiovascular disease and stroke - in both men and women - alone or in combination with other risk factors (high triglycerides combined with high LDL cholesterol can be a particularly deadly combination). For example, in one ground-breaking study, high triglycerides alone increased the risk of cardiovascular disease by 14 percent in men, and by 33 percent in women. But when the test subjects also had low HDL cholesterol (that's the good cholesterol) and other risk factors, high triglycerides increased the risk of disease by 32 percent in men and 76 percent in women.   Fortunately, triglycerides can sometimes be controlled with several diet and lifestyle changes.    What Factors Can Increase Triglycerides? As with cholesterol, eating too much of the wrong kinds of fats will raise your blood triglycerides.  Therefore, it's important to restrict the amounts of saturated fats and trans fats you allow into your diet.  Triglyceride levels can also shoot up after eating foods that are high in carbohydrates or after drinking alcohol.  That's why triglyceride blood tests require an overnight fast.  If you have elevated triglycerides, it's especially important to avoid sugary and refined carbohydrates, including sugar, honey, and other sweeteners, soda and other sugary drinks, candy, baked goods, and anything made with white (refined or enriched) flour, including white bread, rolls, cereals, buns, pastries, regular pasta, and white rice.  You'll also want to limit dried fruit and fruit juice since they're dense in simple sugar.  All of these low-quality carbs cause a sudden rise in insulin, which may lead to a spike in triglycerides.  Triglycerides can also become elevated as a reaction to having diabetes, hypothyroidism, or kidney disease. As with most other  heart-related factors, being overweight and inactive also contribute to abnormal triglycerides. And unfortunately, some people have a genetic predisposition that causes them to manufacture way too much triglycerides on their own, no matter how carefully they eat.     How Can You Lower Your Triglyceride Levels? If you are diagnosed with high triglycerides, it's important to take action. There are several things you can do to help lower your triglyceride levels and improve your heart health:  --> Lose weight if you are overweight.  There is a clear correlation between obesity and high triglycerides - the heavier people are, the higher their triglyceride levels are likely to be. The good news is that losing weight can significantly lower triglycerides. In a large study of individuals with type 2 diabetes, those assigned to the "lifestyle intervention group" - which involved counseling, a low-calorie meal plan, and customized exercise program - lost 8.6% of their body weight and lowered their triglyceride levels by more than 16%. If you're overweight, find a weight loss plan that works for you and commit to shedding the pounds and getting healthier.  --> Reduce the amount of saturated fat and trans fat in your diet.  Start by avoiding or dramatically limiting butter, cream cheese, lard, sour cream, doughnuts, cakes, cookies, candy bars, regular ice cream, fried foods, pizza, cheese sauce, cream-based sauces and salad dressings, high-fat meats (including fatty hamburgers, bologna, pepperoni, sausage, bacon, salami, pastrami, spareribs, and hot dogs), high-fat cuts of beef and pork, and whole-milk dairy products.   Other ways to cut back: Choose lean meats only (including skinless chicken and [place], lean beef, lean pork), fish, and reduced-fat or fat-free dairy products.   Experiment with adding whole soy foods to your diet. Although soy itself may not reduce risk of heart disease, it replaces hazardous  animal fats with healthier proteins. Choose high-quality soy foods, such as tofu, tempeh, soy milk, and edamame (whole soybeans).  Always remove skin from poultry.  Prepare foods by baking, roasting, broiling, boiling, poaching, steaming, grilling, or stir-frying in vegetable oil.  Most stick margarines contain trans fats, and trans fats are also found in some packaged baked goods, potato chips, snack foods, fried foods, and fast food that use or create hydrogenated oils.    (All food labels must now list the amount of trans fats, right after the amount of saturated fats - good news for consumers. As a result, many food companies have now reformulated their products to be trans fat free.many, but not all! So it's still just as important to read labels and make sure the packaged foods you buy don't contain trans fats.)     If you use margarine, purchase soft-tub margarine spreads that contain 0 grams trans fats and don't list any partially hydrogenated oils in the ingredients list. By substituting olive oil or vegetable oil for trans fats in just 2 percent of your daily calories, you can reduce your risk of heart disease by 53 percent.   There is no safe amount of trans fats, so try to keep them as far from your plate as possible.  -->  Avoid foods that are concentrated in sugar (even dried fruit  and fruit juice). Sugary foods can elevate triglyceride levels in the blood, so keep them to a bare minimum.  --> Swap out refined carbohydrates for whole grains.  Refined carbohydrates - like white rice, regular pasta, and anything made with white or "enriched" flour (including white bread, rolls, cereals, buns, and crackers) - raise blood sugar and insulin levels more than fiber-rich whole grains. Higher insulin levels, in turn, can lead to a higher rise in triglycerides after a meal. So, make the switch to whole wheat bread, whole grain pasta, brown or wild rice, and whole grain versions of cereals, crackers,  and other bread products. However, it's important to know that individuals with high triglycerides should moderate even their intake of high-quality starches (since all starches raise blood sugar) - I recommend 1 to 2 servings per meal.  --> Cut way back on alcohol.  If you have high triglycerides, alcohol should be considered a rare treat - if you indulge at all, since even small amounts of alcohol can dramatically increase triglyceride levels.  --> Incorporate omega-3 fats.  Heart-healthy fish oils are especially rich in omega-3 fatty acids. In multiple studies over the past two decades, people who ate diets high in omega-3s had 30 to 40 percent reductions in heart disease. Although we don't yet know why fish oil works so well, there are several possibilities. Omega-3s seem to reduce inflammation, reduce high blood pressure, decrease triglycerides, raise HDL cholesterol, and make blood thinner and less sticky so it is less likely to clot. It's as close to a food prescription for heart health as it gets. If you have high triglycerides, I recommend eating at least three servings of one of the omega-3-rich fish every week (fatty fish is the most concentrated food form of omega three fats). If you cannot manage to eat that much fish, speak with your physician about taking fish oil capsules, which offer similar benefits.The best foods for omega-3 fatty acids include wild salmon (fresh, canned), herring, mackerel (not king), sardines, anchovies, rainbow trout, and Pacific oysters. Non-fish sources of omega-3 fats include omega-3-fortified eggs, ground flaxseed, chia seeds, walnuts, butternuts (white walnuts), seaweed, walnut oil, canola oil, and soybeans.  --> Quit smoking.  Smoking causes inflammation, not just in your lungs, but throughout your body. Inflammation can contribute to atherosclerosis, blood clots, and risk of heart attack. Smoking makes all heart health indicators worse. If you have high  cholesterol, high triglycerides, or high blood pressure, smoking magnifies the danger.  --> Become more physically active.  Even moderate exercise can help improve cholesterol, triglycerides, and blood pressure. Aerobic exercise seems to be able to stop the sharp rise of triglycerides after eating, perhaps because of a decrease in the amount of triglyceride released by the liver, or because active muscle clears triglycerides out of the blood stream more quickly than inactive muscle. If you haven't exercised regularly (or at all) for years, I recommend starting slowly, by walking at an easy pace for 15 minutes a day. Then, as you feel more comfortable, increase the amount. Your ultimate goal should be at least 30 minutes of moderate physical activity, at least five days a week.   Guidelines for a Low Cholesterol, Low Saturated Fat Diet   Fats - Limit total intake of fats and oils. - Avoid butter, stick margarine, shortening, lard, palm and coconut oils. - Limit mayonnaise, salad dressings, gravies and sauces, unless they are homemade with low-fat ingredients. - Limit chocolate. - Choose low-fat and nonfat products, such as  low-fat mayonnaise, low-fat or non-hydrogenated peanut butter, low-fat or fat-free salad dressings and nonfat gravy. - Use vegetable oil, such as canola or olive oil. - Look for margarine that does not contain trans fatty acids. - Use nuts in moderate amounts. - Read ingredient labels carefully to determine both amount and type of fat present in foods. Limit saturated and trans fats! - Avoid high-fat processed and convenience foods.  Meats and Meat Alternatives - Choose fish, chicken, [place] and lean meats. - Use dried beans, peas, lentils and tofu. - Limit egg yolks to three to four per week. - If you eat red meat, limit to no more than three servings per week and choose loin or round cuts. - Avoid fatty meats, such as bacon, sausage, franks, luncheon meats and ribs. -  Avoid all organ meats, including liver.  Dairy - Choose nonfat or low-fat milk, yogurt and cottage cheese. - Most cheeses are high in fat. Choose cheeses made from non-fat milk, such as mozzarella and ricotta cheese. - Choose light or fat-free cream cheese and sour cream. - Avoid cream and sauces made with cream.  Fruits and Vegetables - Eat a wide variety of fruits and vegetables. - Use lemon juice, vinegar or "mist" olive oil on vegetables. - Avoid adding sauces, fat or oil to vegetables.  Breads, Cereals and Grains - Choose whole-grain breads, cereals, pastas and rice. - Avoid high-fat snack foods, such as granola, cookies, pies, pastries, doughnuts and croissants.  Cooking Tips - Avoid deep fried foods. - Trim visible fat off meats and remove skin from poultry before cooking. - Bake, broil, boil, poach or roast poultry, fish and lean meats. - Drain and discard fat that drains out of meat as you cook it. - Add little or no fat to foods. - Use vegetable oil sprays to grease pans for cooking or baking. - Steam vegetables. - Use herbs or no-oil marinades to flavor foods.

## 2017-11-19 NOTE — Progress Notes (Signed)
Impression and Recommendations:    1. Spinal stenosis of lumbar region, unspecified whether neurogenic claudication present   2. Obesity, Class III, BMI 40-49.9 (morbid obesity) ([place])   3. Vitamin D deficiency   4. Pulmonary hypertension ([place])   5. Positive for microalbuminuria     1. Spinal stenosis of lumbar region: Pt did not tolerate gabapentin after taking 900 mg 3x/day. Recommended to taper off the medications the same way he incrementally increased his original dose. Instructed pt to tell his Pain Management physician that this medication did not work when he follows up with him in the near future.  Patient will be seeing Dr. Maryruth Eve in [place]  2. Obesity: Dietary and exercise guidelines discussed. Pt encouraged to lose wt. Dietary and exercise guidelines discussed with patient. Recommended pt to reduce intake of saturated, trans fats and fatty carbohydrates. Handouts provided if desired.  3. Vitamin D insufficiency: Continue taking your 2000 IU supplemental daily. In addition to this, take 50,000 IUs of ergocalciferol once weekly.  4. Pulmonary HTN: Recommended pt to continue taking his medications as directed below. Pt prefers to see his cardiologist, Dr. Bettina Gavia, who will follow him closely, before we make any medication adjustments. Discussed dietary and exercise guidelines. Recommended walking 20 or more minutes every day. Continue keeping a BP log at home and check your BPs daily.  5. Microalbuminuria: This apparently is new to patient.  It is likely due to hypertension, although he did have a history of prediabetes a couple of years back.   Pt instructed to control BP,  recommended pt to drink water equal to half of his body weight in ounces, and exercise, which will all improve his kidney function.  Recheck in 6-12 months.  Follow up in 3 months. Bring BP log in. Will discuss pain management and cardiology referrals at next visit.    Education and routine counseling  performed. Handouts provided.  Orders Placed This Encounter  Procedures  . DME Other see comment    Meds ordered this encounter  Medications  . Vitamin D, Ergocalciferol, (DRISDOL) 50000 units CAPS capsule    Sig: Take 1 capsule (50,000 Units total) by mouth every 7 (seven) days.    Dispense:  12 capsule    Refill:  10    The patient was counseled, risk factors were discussed, anticipatory guidance given.  Gross side effects, risk and benefits, and alternatives of medications discussed with patient.  Patient is aware that all medications have potential side effects and we are unable to predict every side effect or drug-drug interaction that may occur.  Expresses verbal understanding and consents to current therapy plan and treatment regimen.  Return in about 3 months (around 02/17/2018) for chronic MMP, bring BP log.  Please see AVS handed out to patient at the end of our visit for further patient instructions/ counseling done pertaining to today's office visit.    Note: This document was prepared using Dragon voice recognition software and may include unintentional dictation errors.   This document serves as a record of services personally performed by Mellody Dance, DO. It was created on her behalf by Mayer Masker, a trained medical scribe. The creation of this record is based on the scribe's personal observations and the provider's statements to them.    I have reviewed the above medical documentation for accuracy and completeness and I concur.  Mellody Dance 11/19/17 6:36 PM    Subjective:    HPI: Joel Dominguez is a 67 y.o.  male who presents to [place] at [facility] today for follow up for HTN, which was elevated last office visit as well as we started him on gabapentin.  He is here for follow-up of this new medicine and recheck of blood pressure.    He is also here to go over blood work in person.  Pt states he is not walking as often as he should.     Pt takes 2000 IUs of vitamin D daily.   He takes 20 mEq 2x/day of KCl.  Along with his Bumex per cardiology.   HTN:  -  His blood pressure has been controlled at home.   - Patient reports good compliance with blood pressure medications  - Denies medication S-E   - Smoking Status noted   Pt is taking a BP log at home. His average BP readings are about 145/75.  - He denies new onset of: chest pain, exercise intolerance, shortness of breath, dizziness, visual changes, headache, lower extremity swelling or claudication.   Pt has a follow up with Dr. Bettina Gavia, cardiologist, on December 26 2017.  Gabapentin: Pt states he feels he is "in another time zone" when he takes the medication. He took 900mg  3x/day. He felt like he was in a haze and he felt weak after about 9-12 days of taking the medication. He had difficulty walking, as well as problems driving. Pt has an appointment with Dr. Maryruth Eve for pain management clinic for his neuropathic pain.   Last 3 blood pressure readings in our office are as follows: BP Readings from Last 3 Encounters:  11/19/17 138/86  11/10/17 132/82  10/22/17 (!) 142/86    Pulse Readings from Last 3 Encounters:  11/19/17 82  11/10/17 63  10/22/17 72    Filed Weights   11/19/17 1513  Weight: (!) 304 lb (137.9 kg)      Patient Care Team    Relationship Specialty Notifications Start End  Mellody Dance, DO PCP - General Family Medicine  10/08/16   Lucia Bitter., MD Physician Assistant Pain Medicine  11/13/17   Richardo Priest, MD Consulting Physician Cardiology  11/13/17      Lab Results  Component Value Date   CREATININE 0.94 10/22/2017   BUN 12 10/22/2017   NA 141 10/22/2017   K 3.6 10/22/2017   CL 99 10/22/2017   CO2 27 10/22/2017    Lab Results  Component Value Date   CHOL 161 10/22/2017   CHOL 134 03/11/2017   CHOL 156 10/08/2016    Lab Results  Component Value Date   HDL 43 10/22/2017   HDL 34 (A) 03/11/2017   HDL 36  (L) 10/08/2016    Lab Results  Component Value Date   LDLCALC 92 10/22/2017   LDLCALC 73 03/11/2017   LDLCALC 75 10/08/2016    Lab Results  Component Value Date   TRIG 129 10/22/2017   TRIG 136 03/11/2017   TRIG 225 (H) 10/08/2016    Lab Results  Component Value Date   CHOLHDL 3.7 10/22/2017   CHOLHDL 4.3 10/08/2016    No results found for: LDLDIRECT ===================================================================   Patient Active Problem List   Diagnosis Date Noted  . OSA (obstructive sleep apnea) 10/08/2016    Priority: High  . Pulmonary hypertension ([place]) 10/08/2016    Priority: High  . Obesity, Class III, BMI 40-49.9 (morbid obesity) ([place]) 10/08/2016    Priority: High  . h/o Stroke 10/08/2016    Priority: Medium  .  Chronic combined systolic and diastolic heart failure ([place]) 10/08/2016    Priority: Medium  . Chronic atrial fibrillation ([place]) 10/08/2016    Priority: Medium  . Chronic anticoagulation 10/08/2016    Priority: Medium  . Generalized OA 10/08/2016    Priority: Low  . Foot drop- R 10/08/2016    Priority: Low  . Lumbar spinal stenosis 10/08/2016    Priority: Low  . Osteoarthritis 11/19/2017  . Vitamin D deficiency 11/19/2017  . Positive for microalbuminuria 11/19/2017  . Hypertensive heart disease with heart failure ([place]) 11/10/2017  . 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 fascicular block) 11/07/2017  . Aortic stenosis 11/06/2017  . Hypertension   . H/O noncompliance with medical treatment, presenting hazards to health 10/22/2017     Past Medical History:  Diagnosis Date  . A-fib- couple yrs now. 10/08/2016  . Aortic stenosis 11/06/2017  . Atherosclerotic heart disease of native coronary artery with unspecified angina pectoris ([place]) 10/08/2016  . CHF (congestive heart failure) ([place])   . Chronic anticoagulation 10/08/2016  . Foot drop- R 10/08/2016   Patient known foot drop for many years due to lumbar spinal stenosis.-Over the  past couple of months symptoms have gotten worse.  He feels he is tripping more and cannot lift his foot up as much. Toes are curling and calluses on end of toes.    - Did see spinal surgeon many yrs ago.  Declined sx at that time.     . Generalized OA 10/08/2016   Ortho doc-  Dr. [facility] in [place]- both knees replaced 08, 09.     . H/O noncompliance with medical treatment, presenting hazards to health 10/22/2017  . Heart disease   . Hypertension   . Obesity, Class III, BMI 40-49.9 (morbid obesity) ([place]) 10/08/2016  . OSA (obstructive sleep apnea) 10/08/2016   Bi-PAP nitely- been 20+ yrs now  . Pulmonary hypertension ([place]) 10/08/2016  . Spinal stenosis, lumbar region, with neurogenic claudication 10/08/2016   Back specialist in [place]-    only surgeon he would use- pt declined referral here for back pain mgt    . Stroke [facility])      Past Surgical History:  Procedure Laterality Date  . REPLACEMENT TOTAL KNEE Bilateral   . TIBIA FRACTURE SURGERY       Family History  Problem Relation Age of Onset  . Congestive Heart Failure Mother   . Hypertension Mother   . Diabetes Mother   . Cancer Father        lung  . Congestive Heart Failure Sister   . Hypertension Sister   . Hypertension Brother   . Hypertension Sister   . Diabetes Brother      Social History   Substance and Sexual Activity  Drug Use No  ,  Social History   Substance and Sexual Activity  Alcohol Use Yes   Comment: once monthly  ,  Social History   Tobacco Use  Smoking Status Never Smoker  Smokeless Tobacco Never Used  ,    Current Outpatient Medications on File Prior to Visit  Medication Sig Dispense Refill  . apixaban (ELIQUIS) 5 MG TABS tablet Take 5 mg by mouth 2 (two) times daily.    . bumetanide (BUMEX) 2 MG tablet Take 2 mg by mouth 2 (two) times daily.    . carvedilol (COREG) 25 MG tablet Take 25 mg by mouth 2 (two) times daily with a meal.    . gabapentin (NEURONTIN) 300 MG capsule Take 3  capsules  (900 mg total) by mouth 3 (three) times daily. 810 capsule 1  . HYDROcodone-acetaminophen (NORCO) 10-325 MG tablet Take 1 tablet by mouth as needed.    Marland Kitchen losartan (COZAAR) 100 MG tablet     . Multiple Vitamin (MULTIVITAMIN) tablet Take 1 tablet by mouth daily.    . potassium chloride (K-DUR,KLOR-CON) 10 MEQ tablet Take 20 mEq by mouth 2 (two) times daily.    Marland Kitchen telmisartan (MICARDIS) 80 MG tablet Take 1 tablet (80 mg total) by mouth daily. 60 tablet 3   No current facility-administered medications on file prior to visit.      No Known Allergies   Review of Systems:   General:  Denies fever, chills Optho/Auditory:   Denies visual changes, blurred vision Respiratory:   Denies SOB, cough, wheeze, DIB  Cardiovascular:   Denies chest pain, palpitations, painful respirations Gastrointestinal:   Denies nausea, vomiting, diarrhea.  Endocrine:     Denies new hot or cold intolerance Musculoskeletal:  Denies joint swelling, gait issues, or new unexplained myalgias/ arthralgias Skin:  Denies rash, suspicious lesions  Neurological:    Denies dizziness, unexplained weakness, numbness  Psychiatric/Behavioral:   Denies mood changes  Objective:    Blood pressure 138/86, pulse 82, height 6' (1.829 m), weight (!) 304 lb (137.9 kg). Body mass index is 41.23 kg/m. General: Well Developed, well nourished, and in no acute distress.  HEENT: Normocephalic, atraumatic, pupils equal round reactive to light, neck supple, No carotid bruits, no JVD Skin: Warm and dry, cap RF less 2 sec Cardiac: Regular rate and rhythm, S1, S2 WNL's, no murmurs rubs or gallops Respiratory: ECTA B/L, Not using accessory muscles, speaking in full sentences. NeuroM-Sk: Ambulates w/o assistance, moves ext * 4 w/o difficulty, sensation grossly intact.  Ext: scant edema b/l lower ext Psych: No HI/SI, judgement and insight good, Euthymic mood. Full Affect.   Recent Results (from the past 2160 hour(s))  Comprehensive metabolic  panel     Status: None   Collection Time: 10/22/17  4:08 PM  Result Value Ref Range   Glucose 95 65 - 99 mg/dL   BUN 12 8 - 27 mg/dL   Creatinine, Ser 0.94 0.76 - 1.27 mg/dL   GFR calc non Af Amer 84 >59 mL/min/1.73   GFR calc Af Amer 97 >59 mL/min/1.73   BUN/Creatinine Ratio 13 10 - 24   Sodium 141 134 - 144 mmol/L   Potassium 3.6 3.5 - 5.2 mmol/L   Chloride 99 96 - 106 mmol/L   CO2 27 20 - 29 mmol/L   Calcium 9.2 8.6 - 10.2 mg/dL   Total Protein 7.7 6.0 - 8.5 g/dL   Albumin 4.3 3.6 - 4.8 g/dL   Globulin, Total 3.4 1.5 - 4.5 g/dL   Albumin/Globulin Ratio 1.3 1.2 - 2.2   Bilirubin Total 0.8 0.0 - 1.2 mg/dL   Alkaline Phosphatase 98 39 - 117 IU/L   AST 19 0 - 40 IU/L   ALT 13 0 - 44 IU/L  CBC with Differential/Platelet     Status: None   Collection Time: 10/22/17  4:08 PM  Result Value Ref Range   WBC 7.1 3.4 - 10.8 x10E3/uL   RBC 4.90 4.14 - 5.80 x10E6/uL   Hemoglobin 14.1 13.0 - 17.7 g/dL   Hematocrit 39.8 37.5 - 51.0 %   MCV 81 79 - 97 fL   MCH 28.8 26.6 - 33.0 pg   MCHC 35.4 31.5 - 35.7 g/dL   RDW 14.1 12.3 - 15.4 %  Platelets 249 150 - 379 x10E3/uL   Neutrophils 69 Not Estab. %   Lymphs 18 Not Estab. %   Monocytes 9 Not Estab. %   Eos 3 Not Estab. %   Basos 1 Not Estab. %   Neutrophils Absolute 4.9 1.4 - 7.0 x10E3/uL   Lymphocytes Absolute 1.3 0.7 - 3.1 x10E3/uL   Monocytes Absolute 0.7 0.1 - 0.9 x10E3/uL   EOS (ABSOLUTE) 0.2 0.0 - 0.4 x10E3/uL   Basophils Absolute 0.0 0.0 - 0.2 x10E3/uL   Immature Granulocytes 0 Not Estab. %   Immature Grans (Abs) 0.0 0.0 - 0.1 x10E3/uL  Hemoglobin A1c     Status: None   Collection Time: 10/22/17  4:08 PM  Result Value Ref Range   Hgb A1c MFr Bld 5.6 4.8 - 5.6 %    Comment:          Prediabetes: 5.7 - 6.4          Diabetes: >6.4          Glycemic control for adults with diabetes: <7.0    Est. average glucose Bld gHb Est-mCnc 114 mg/dL  Lipid panel     Status: None   Collection Time: 10/22/17  4:08 PM  Result Value Ref  Range   Cholesterol, Total 161 100 - 199 mg/dL   Triglycerides 129 0 - 149 mg/dL   HDL 43 >39 mg/dL   VLDL Cholesterol Cal 26 5 - 40 mg/dL   LDL Calculated 92 0 - 99 mg/dL   Chol/HDL Ratio 3.7 0.0 - 5.0 ratio    Comment:                                   T. Chol/HDL Ratio                                             Men  Women                               1/2 Avg.Risk  3.4    3.3                                   Avg.Risk  5.0    4.4                                2X Avg.Risk  9.6    7.1                                3X Avg.Risk 23.4   11.0   Magnesium     Status: None   Collection Time: 10/22/17  4:08 PM  Result Value Ref Range   Magnesium 2.0 1.6 - 2.3 mg/dL  Phosphorus     Status: None   Collection Time: 10/22/17  4:08 PM  Result Value Ref Range   Phosphorus 3.5 2.5 - 4.5 mg/dL  TSH     Status: None   Collection Time: 10/22/17  4:08 PM  Result Value Ref Range   TSH 0.745 0.450 - 4.500 uIU/mL  T4, free  Status: None   Collection Time: 10/22/17  4:08 PM  Result Value Ref Range   Free T4 1.15 0.82 - 1.77 ng/dL  VITAMIN D 25 Hydroxy (Vit-D Deficiency, Fractures)     Status: None   Collection Time: 10/22/17  4:08 PM  Result Value Ref Range   Vit D, 25-Hydroxy 34.4 30.0 - 100.0 ng/mL    Comment: Vitamin D deficiency has been defined by the [facility] practice guideline as a level of serum 25-OH vitamin D less than 20 ng/mL (1,2). The Endocrine Society went on to further define vitamin D insufficiency as a level between 21 and 29 ng/mL (2). 1. IOM (Institute of Medicine). 2010. Dietary reference    intakes for calcium and D. [place]: The    [employer]. 2. Holick MF, Binkley [place], Bischoff-Ferrari HA, et al.    Evaluation, treatment, and prevention of vitamin D    deficiency: an Endocrine Society clinical practice    guideline. JCEM. 2011 Jul; 96(7):1911-30.   Microalbumin / creatinine urine ratio     Status: Abnormal    Collection Time: 10/22/17  4:10 PM  Result Value Ref Range   Creatinine, Urine 134.3 Not Estab. mg/dL   Albumin, Urine 116.0 Not Estab. ug/mL   Microalb/Creat Ratio 86.4 (H) 0.0 - 30.0 mg/g creat    Comment:                      Normal:                0.0 -  30.0                      Albuminuria:          31.0 - 300.0                      Clinical albuminuria:       >300.0

## 2017-12-16 DIAGNOSIS — G894 Chronic pain syndrome: Secondary | ICD-10-CM | POA: Diagnosis not present

## 2017-12-16 DIAGNOSIS — M21371 Foot drop, right foot: Secondary | ICD-10-CM | POA: Diagnosis not present

## 2017-12-16 DIAGNOSIS — Z79899 Other long term (current) drug therapy: Secondary | ICD-10-CM | POA: Diagnosis not present

## 2017-12-16 DIAGNOSIS — M48062 Spinal stenosis, lumbar region with neurogenic claudication: Secondary | ICD-10-CM | POA: Diagnosis not present

## 2017-12-16 DIAGNOSIS — Z5181 Encounter for therapeutic drug level monitoring: Secondary | ICD-10-CM | POA: Diagnosis not present

## 2017-12-16 DIAGNOSIS — M47816 Spondylosis without myelopathy or radiculopathy, lumbar region: Secondary | ICD-10-CM | POA: Diagnosis not present

## 2017-12-19 DIAGNOSIS — M48061 Spinal stenosis, lumbar region without neurogenic claudication: Secondary | ICD-10-CM | POA: Diagnosis not present

## 2017-12-19 DIAGNOSIS — M48062 Spinal stenosis, lumbar region with neurogenic claudication: Secondary | ICD-10-CM | POA: Diagnosis not present

## 2017-12-19 DIAGNOSIS — M21371 Foot drop, right foot: Secondary | ICD-10-CM | POA: Diagnosis not present

## 2017-12-19 DIAGNOSIS — G894 Chronic pain syndrome: Secondary | ICD-10-CM | POA: Diagnosis not present

## 2017-12-19 DIAGNOSIS — M5126 Other intervertebral disc displacement, lumbar region: Secondary | ICD-10-CM | POA: Diagnosis not present

## 2017-12-26 ENCOUNTER — Encounter: Payer: Self-pay | Admitting: Cardiology

## 2017-12-26 ENCOUNTER — Ambulatory Visit (INDEPENDENT_AMBULATORY_CARE_PROVIDER_SITE_OTHER): Payer: Medicare Other | Admitting: Cardiology

## 2017-12-26 VITALS — BP 128/78 | HR 75 | Ht 72.0 in | Wt 309.4 lb

## 2017-12-26 DIAGNOSIS — I482 Chronic atrial fibrillation, unspecified: Secondary | ICD-10-CM

## 2017-12-26 DIAGNOSIS — I110 Hypertensive heart disease with heart failure: Secondary | ICD-10-CM

## 2017-12-26 DIAGNOSIS — Z7901 Long term (current) use of anticoagulants: Secondary | ICD-10-CM | POA: Diagnosis not present

## 2017-12-26 DIAGNOSIS — I5042 Chronic combined systolic (congestive) and diastolic (congestive) heart failure: Secondary | ICD-10-CM

## 2017-12-26 MED ORDER — TELMISARTAN 80 MG PO TABS: 80.0000 mg | ORAL_TABLET | Freq: Every day | ORAL | 3 refills | Status: DC

## 2017-12-26 MED ORDER — POTASSIUM CHLORIDE CRYS ER 10 MEQ PO TBCR: 20.0000 meq | EXTENDED_RELEASE_TABLET | Freq: Two times a day (BID) | ORAL | 3 refills | Status: DC

## 2017-12-26 MED ORDER — BUMETANIDE 2 MG PO TABS: 2.0000 mg | ORAL_TABLET | Freq: Two times a day (BID) | ORAL | 3 refills | Status: DC

## 2017-12-26 MED ORDER — APIXABAN 5 MG PO TABS: 5.0000 mg | ORAL_TABLET | Freq: Two times a day (BID) | ORAL | 3 refills | Status: DC

## 2017-12-26 MED ORDER — CARVEDILOL 25 MG PO TABS: 25.0000 mg | ORAL_TABLET | Freq: Two times a day (BID) | ORAL | 3 refills | Status: DC

## 2017-12-26 NOTE — Patient Instructions (Addendum)
Medication Instructions:  Your physician recommends that you continue on your current medications as directed. Please refer to the Current Medication list given to you today.  Call if your BP is consistently < 004 systolic  Labwork: None  Testing/Procedures: None  Follow-Up: Your physician wants you to follow-up in: 6 months. You will receive a reminder letter in the mail two months in advance. If you don't receive a letter, please call our office to schedule the follow-up appointment.  Any Other Special Instructions Will Be Listed Below (If Applicable).     If you need a refill on your cardiac medications before your next appointment, please call your pharmacy.

## 2017-12-26 NOTE — Progress Notes (Signed)
Cardiology Office Note:    Date:  12/26/2017   ID:  Joel Dominguez, DOB 04/01/1951, MRN [id number]  PCP:  Joel Dance, DO  Cardiologist:  Joel More, MD    Referring MD: Joel Dance, DO    ASSESSMENT:    1. Hypertensive heart disease with heart failure ([place])   2. Chronic atrial fibrillation (HCC)   3. Chronic anticoagulation   4. Chronic combined systolic and diastolic heart failure (HCC)    PLAN:    In order of problems listed above:  1. Improved blood pressure at target continue home monitoring and current treatment including Dominguez intense ARB 2. Stable rate controlled continue his beta-blocker and anticoagulant 3. Continue his current anticoagulant he has had no bleeding complication 4. Stable compensated continue his current loop diuretic sodium restriction beta-blocker and ARB.   Next appointment: 6 months   Medication Adjustments/Labs and Tests Ordered: Current medicines are reviewed at length with the patient today.  Concerns regarding medicines are outlined above.  No orders of the defined types were placed in this encounter.  Meds ordered this encounter  Medications  . DISCONTD: apixaban (ELIQUIS) 5 MG TABS tablet    Sig: Take 1 tablet (5 mg total) by mouth 2 (two) times daily.    Dispense:  180 tablet    Refill:  3  . bumetanide (BUMEX) 2 MG tablet    Sig: Take 1 tablet (2 mg total) by mouth 2 (two) times daily.    Dispense:  180 tablet    Refill:  3  . carvedilol (COREG) 25 MG tablet    Sig: Take 1 tablet (25 mg total) by mouth 2 (two) times daily with a meal.    Dispense:  180 tablet    Refill:  3  . telmisartan (MICARDIS) 80 MG tablet    Sig: Take 1 tablet (80 mg total) by mouth daily.    Dispense:  90 tablet    Refill:  3  . potassium chloride (K-DUR,KLOR-CON) 10 MEQ tablet    Sig: Take 2 tablets (20 mEq total) by mouth 2 (two) times daily.    Dispense:  180 tablet    Refill:  3  . apixaban (ELIQUIS) 5 MG TABS tablet    Sig: Take 1  tablet (5 mg total) by mouth 2 (two) times daily.    Dispense:  180 tablet    Refill:  3    Chief Complaint  Patient presents with  . Follow-up    6 week flup appt   . Hypertension    History of Present Illness:    Joel Dominguez is a 67 y.o. male with a hx of chronic atrial fibrillation, mild aortic stenosis, heart failure, RBBB with LAHB, hypertension, anticoagulation and PAH last seen by me 11/10/17 with uncontrolled hypertension.  ASSESSMENT:    1. Chronic atrial fibrillation ([place])   2. Mild aortic stenosis   3. Chronic combined systolic and diastolic heart failure ([place])   4. 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 fascicular block)   5. Hypertensive heart disease with heart failure ([place])   6. Chronic anticoagulation   7. Pulmonary hypertension ([place])    PLAN:    In order of problems listed above: 1. Stable rate controlled continue his current diuretic and anticoagulant.  If unable to be on long-term anticoagulation referral for watchman device would be appropriate 2. Stable no murmur on examination plan repeat echocardiogram in 2 years 3. Stable compensators at this time I do not think he requires  an echocardiogram for ejection fraction and continue his current treatment with loop diuretic proximal diabetic diuretic beta-blocker and ARB. 4. Stable pattern on EKG 5. Heart failure is compensated hypertension is out of range he will switch to a Dominguez potent ARB and will drop off a list of blood pressures in 2 weeks.  If not on target I will start her on vasodilator therapy with hydralazine and isosorbide. 6. Stable continue his anticoagulant no bleeding complication 7. Clinically most recent echocardiogram shows mild pulmonary hypertension likely related to heart failure and sleep apnea and I do not think he requires an invasive evaluation referral for therapy for pulmonary hypertension.  Compliance with diet, lifestyle and medications: Yes Past Medical History:    Diagnosis Date  . A-fib- couple yrs now. 10/08/2016  . Aortic stenosis 11/06/2017  . Atherosclerotic heart disease of native coronary artery with unspecified angina pectoris ([place]) 10/08/2016  . CHF (congestive heart failure) ([place])   . Chronic anticoagulation 10/08/2016  . Foot drop- R 10/08/2016   Patient known foot drop for many years due to lumbar spinal stenosis.-Over the past couple of months symptoms have gotten worse.  He feels he is tripping Dominguez and cannot lift his foot up as much. Toes are curling and calluses on end of toes.    - Did see spinal surgeon many yrs ago.  Declined sx at that time.     . Generalized OA 10/08/2016   Ortho doc-  Dr. Boston Dominguez in [place]- both knees replaced 08, 09.     . H/O noncompliance with medical treatment, presenting hazards to health 10/22/2017  . Heart disease   . Hypertension   . Obesity, Class III, BMI 40-49.9 (morbid obesity) ([place]) 10/08/2016  . OSA (obstructive sleep apnea) 10/08/2016   Bi-PAP nitely- been 20+ yrs now  . Pulmonary hypertension ([place]) 10/08/2016  . Spinal stenosis, lumbar region, with neurogenic claudication 10/08/2016   Back specialist in [place]-    only surgeon he would use- pt declined referral here for back pain mgt    . Stroke [facility])     Past Surgical History:  Procedure Laterality Date  . REPLACEMENT TOTAL KNEE Bilateral   . TIBIA FRACTURE SURGERY      Current Medications: Current Meds  Medication Sig  . apixaban (ELIQUIS) 5 MG TABS tablet Take 1 tablet (5 mg total) by mouth 2 (two) times daily.  . bumetanide (BUMEX) 2 MG tablet Take 1 tablet (2 mg total) by mouth 2 (two) times daily.  . carvedilol (COREG) 25 MG tablet Take 1 tablet (25 mg total) by mouth 2 (two) times daily with a meal.  . HYDROcodone-acetaminophen (NORCO) 10-325 MG tablet Take 1 tablet by mouth as needed.  . Multiple Vitamin (MULTIVITAMIN) tablet Take 1 tablet by mouth daily.  . potassium chloride (K-DUR,KLOR-CON) 10 MEQ tablet Take 2 tablets (20 mEq total)  by mouth 2 (two) times daily.  Marland Kitchen telmisartan (MICARDIS) 80 MG tablet Take 1 tablet (80 mg total) by mouth daily.  . Vitamin D, Ergocalciferol, (DRISDOL) 50000 units CAPS capsule Take 1 capsule (50,000 Units total) by mouth every 7 (seven) days.  . [DISCONTINUED] apixaban (ELIQUIS) 5 MG TABS tablet Take 5 mg by mouth 2 (two) times daily.  . [DISCONTINUED] apixaban (ELIQUIS) 5 MG TABS tablet Take 1 tablet (5 mg total) by mouth 2 (two) times daily.  . [DISCONTINUED] bumetanide (BUMEX) 2 MG tablet Take 2 mg by mouth 2 (two) times daily.  . [DISCONTINUED] carvedilol (COREG) 25 MG tablet Take  25 mg by mouth 2 (two) times daily with a meal.  . [DISCONTINUED] potassium chloride (K-DUR,KLOR-CON) 10 MEQ tablet Take 20 mEq by mouth 2 (two) times daily.  . [DISCONTINUED] telmisartan (MICARDIS) 80 MG tablet Take 1 tablet (80 mg total) by mouth daily.     Allergies:   Patient has no known allergies.   Social History   Socioeconomic History  . Marital status: Married    Spouse name: None  . Number of children: None  . Years of education: None  . Highest education level: None  Social Needs  . Financial resource strain: None  . Food insecurity - worry: None  . Food insecurity - inability: None  . Transportation needs - medical: None  . Transportation needs - non-medical: None  Occupational History  . None  Tobacco Use  . Smoking status: Never Smoker  . Smokeless tobacco: Never Used  Substance and Sexual Activity  . Alcohol use: Yes    Comment: once monthly  . Drug use: No  . Sexual activity: No  Other Topics Concern  . None  Social History Narrative  . None     Family History: The patient's family history includes Cancer in his father; Congestive Heart Failure in his mother and sister; Diabetes in his brother and mother; Hypertension in his brother, mother, sister, and sister. ROS:   Please see the history of present illness.    All other systems reviewed and are  negative.  EKGs/Labs/Other Studies Reviewed:    The following studies were reviewed today:  Recent Labs: 10/22/2017: ALT 13; BUN 12; Creatinine, Ser 0.94; Hemoglobin 14.1; Magnesium 2.0; Platelets 249; Potassium 3.6; Sodium 141; TSH 0.745  Recent Lipid Panel    Component Value Date/Time   CHOL 161 10/22/2017 1608   TRIG 129 10/22/2017 1608   HDL 43 10/22/2017 1608   CHOLHDL 3.7 10/22/2017 1608   CHOLHDL 4.3 10/08/2016 1144   VLDL 45 (H) 10/08/2016 1144   LDLCALC 92 10/22/2017 1608    Physical Exam:    VS:  BP 128/78 (BP Location: Right Arm, Patient Position: Sitting, Cuff Size: Large)   Pulse 75   Ht 6' (1.829 m)   Wt (!) 309 lb 6.4 oz (140.3 kg)   SpO2 98%   BMI 41.96 kg/m     Wt Readings from Last 3 Encounters:  12/26/17 (!) 309 lb 6.4 oz (140.3 kg)  11/19/17 (!) 304 lb (137.9 kg)  11/10/17 (!) 311 lb (141.1 kg)     GEN:  Well nourished, well developed in no acute distress HEENT: Normal NECK: No JVD; No carotid bruits LYMPHATICS: No lymphadenopathy CARDIAC: RRR, no murmurs, rubs, gallops RESPIRATORY:  Clear to auscultation without rales, wheezing or rhonchi  ABDOMEN: Soft, non-tender, non-distended MUSCULOSKELETAL:  No edema; No deformity  SKIN: Warm and dry NEUROLOGIC:  Alert and oriented x 3 PSYCHIATRIC:  Normal affect    Signed, Joel More, MD  12/26/2017 11:25 AM    [facility]

## 2018-01-20 DIAGNOSIS — M5417 Radiculopathy, lumbosacral region: Secondary | ICD-10-CM | POA: Diagnosis not present

## 2018-01-20 DIAGNOSIS — G894 Chronic pain syndrome: Secondary | ICD-10-CM | POA: Diagnosis not present

## 2018-01-22 ENCOUNTER — Telehealth: Payer: Self-pay | Admitting: Cardiology

## 2018-01-22 DIAGNOSIS — M5417 Radiculopathy, lumbosacral region: Secondary | ICD-10-CM

## 2018-01-22 HISTORY — DX: Radiculopathy, lumbosacral region: M54.17

## 2018-01-22 NOTE — Telephone Encounter (Signed)
Can patient hold Eliquis 5mg  prior to having steroid injection?  Please advise.

## 2018-01-22 NOTE — Telephone Encounter (Signed)
Need to get ok to go off meds for 72 hour (blood thinner) for steroid shot in spine

## 2018-01-22 NOTE — Telephone Encounter (Signed)
Pt notified OK to hold Eliquis 72 hours prior to steroid injection per Dr Bettina Gavia.  Pt requested copy of this communication to be sent to Dr Georgina Quint office.  Documentation was faxed to [phone]

## 2018-01-22 NOTE — Telephone Encounter (Signed)
yes

## 2018-01-28 DIAGNOSIS — I509 Heart failure, unspecified: Secondary | ICD-10-CM | POA: Diagnosis not present

## 2018-01-28 DIAGNOSIS — Z8673 Personal history of transient ischemic attack (TIA), and cerebral infarction without residual deficits: Secondary | ICD-10-CM | POA: Diagnosis not present

## 2018-01-28 DIAGNOSIS — M5417 Radiculopathy, lumbosacral region: Secondary | ICD-10-CM | POA: Diagnosis not present

## 2018-01-28 DIAGNOSIS — M1990 Unspecified osteoarthritis, unspecified site: Secondary | ICD-10-CM | POA: Diagnosis not present

## 2018-01-28 DIAGNOSIS — Z79899 Other long term (current) drug therapy: Secondary | ICD-10-CM | POA: Diagnosis not present

## 2018-01-28 DIAGNOSIS — I110 Hypertensive heart disease with heart failure: Secondary | ICD-10-CM | POA: Diagnosis not present

## 2018-02-17 ENCOUNTER — Ambulatory Visit (INDEPENDENT_AMBULATORY_CARE_PROVIDER_SITE_OTHER): Payer: Medicare Other | Admitting: Family Medicine

## 2018-02-17 ENCOUNTER — Encounter: Payer: Self-pay | Admitting: Family Medicine

## 2018-02-17 VITALS — BP 175/97 | HR 68 | Ht 72.0 in | Wt 309.0 lb

## 2018-02-17 DIAGNOSIS — R809 Proteinuria, unspecified: Secondary | ICD-10-CM

## 2018-02-17 DIAGNOSIS — I639 Cerebral infarction, unspecified: Secondary | ICD-10-CM | POA: Diagnosis not present

## 2018-02-17 DIAGNOSIS — Z9119 Patient's noncompliance with other medical treatment and regimen: Secondary | ICD-10-CM

## 2018-02-17 DIAGNOSIS — I10 Essential (primary) hypertension: Secondary | ICD-10-CM

## 2018-02-17 DIAGNOSIS — I25119 Atherosclerotic heart disease of native coronary artery with unspecified angina pectoris: Secondary | ICD-10-CM | POA: Diagnosis not present

## 2018-02-17 DIAGNOSIS — Z91199 Patient's noncompliance with other medical treatment and regimen due to unspecified reason: Secondary | ICD-10-CM

## 2018-02-17 MED ORDER — SPIRONOLACTONE 25 MG PO TABS
25.0000 mg | ORAL_TABLET | Freq: Every day | ORAL | 0 refills | Status: DC
Start: 2018-02-17 — End: 2018-05-11

## 2018-02-17 NOTE — Progress Notes (Signed)
Impression and Recommendations:    1. Hypertension, unspecified type   2. Cerebrovascular accident (CVA), unspecified mechanism ([place])   3. Obesity, Class III, BMI 40-49.9 (morbid obesity) ([place])   4. Positive for microalbuminuria   5. H/O noncompliance with medical treatment, presenting hazards to health     1. Obesity Class III- -Weight is unchanged from prior OV. -Recommend pt to continue losing weight.  -Discussed importance of regular exercise and prudent diet.  2. Positive for microalbuminuria- -Drink adequate amounts of water, equal to half of your body weight in oz per day. -will continue to monitor yearly. Control BP to minimize detrimental affects on the kidneys.  -Continue ARB.  3. H/o noncompliance with medical treatment -Told pt to take all of his medications as directed. Stressed the importance of following sound medical advice given by his healthcare providers.  4. Hypertension- -BP is elevated and suboptimal in office today. BP is not well-controlled at this time.  -Upon manual recheck at 10:03 am, BP was 172/100. -Pt sees Dr. Bettina Gavia, cardiology, who is following him closely and is managing his medications. See med list below. -Continue taking your meds per cardiology. -Check your BP and HR at home and keep a log. Bring this into next OV. -Told pt we prefer to have his BP <130/80 due to his h/o stroke. -We will contact his cardiologist to possibly make adjustments to his medications in order to better control his BP. We reached out to Dr. Shirlee More and will await his response before making any adjustments to his meds. 5. -Per patient's cardiologist he said to: start Spironolactone 25 mg a day, would check Bmp 1 week later    6. H/o CVA- -This was found incidentally when he was having an MRI done. Will continue to monitor cholesterol which is <100 and per cardiology, at goal. -discussed with pt higher blood pressures is not good for his h/o  stroke.    No orders of the defined types were placed in this encounter.   No orders of the defined types were placed in this encounter.   Gross side effects, risk and benefits, and alternatives of medications and treatment plan in general discussed with patient.  Patient is aware that all medications have potential side effects and we are unable to predict every side effect or drug-drug interaction that may occur.   Patient will call with any questions prior to using medication if they have concerns.  Expresses verbal understanding and consents to current therapy and treatment regimen.  No barriers to understanding were identified.  Red flag symptoms and signs discussed in detail.  Patient expressed understanding regarding what to do in case of emergency\urgent symptoms  Please see AVS handed out to patient at the end of our visit for further patient instructions/ counseling done pertaining to today's office visit.   Return in about 4 months (around 06/19/2018).    Note: This note was prepared with assistance of Dragon voice recognition software. Occasional wrong-word or sound-a-like substitutions may have occurred due to the inherent limitations of voice recognition software.  This document serves as a record of services personally performed by Mellody Dance, DO. It was created on her behalf by Mayer Masker, a trained medical scribe. The creation of this record is based on the scribe's personal observations and the provider's statements to them.   I have reviewed the above medical documentation for accuracy and completeness and I concur.  Mellody Dance 02/17/18 2:46 PM  --------------------------------------------------------------------------------------------------------------------------------------------------------------------------------------------------------------------------------------------  Subjective:     HPI: Joel Dominguez is a 67 y.o. male who presents to  [place] at [facility] today for Multiple medical problems as listed below:  HTN HPI:  -  His blood pressure has been controlled at home.  Pt is sometimes checking it at home. He states he can tell when his BP is elevated at home and he will see "it pulsating" in his eyes. He denies any symptoms.  BP at home is upper 130s or low 140s/90.  He sees Dr. Bettina Gavia, cardiologist, who is managing his medications. He is on bumex, carvedilol, and telmisartan.  - Patient reports good compliance with blood pressure medications  - Denies medication S-E   - Smoking Status noted   - He denies new onset of: chest pain, exercise intolerance, shortness of breath, dizziness, visual changes, headache, lower extremity swelling or claudication.   Cholesterol He has not been on cholesterol meds before due to results being WNL. He has a h/o stroke they found during MRI. Per cardiologist, he is not on any medications and does not need to be.   Wt loss He states his weight is up and down and unchanged from prior. He states this is likely due to his water pill. He wanted to see if he can maintain his weight, where he has lost 60-65 lbs in the past.   Diet/exercise He walks regularly outside but does not exercise often. When the weather is nice he likes to be active and moving outside. He drinks 4-5 30oz of water per day. He is trying to eat less carbs and is reducing his caloric intake.  Leg/foot He has pain in his legs and feet and has chronic foot drop. His toes curl under and he is aggravated by this. He is going to ask Dr. Maryruth Eve with pain management if he can do anything to alleviate his leg/foot pain.    Back pain Since last OV, he had a spinal injection with Dr. Maryruth Eve. This went well, he felt a difference in the first few days. He states his pain went from 8/10 to 6.5/10. He follows up with him in 2 weeks. He takes Norco for his pain.   Prediabetes- A1c was 5.6 3 months ago. Recheck  this in 3 months.    Colonoscopy He had this one on  09-08-2013, there was 1 hyperplastic polyp. He is due to repeat in 10 years.   Last 3 blood pressure readings in our office are as follows: BP Readings from Last 3 Encounters:  02/17/18 (!) 175/97  12/26/17 128/78  11/19/17 138/86    Filed Weights   02/17/18 0938  Weight: (!) 309 lb (140.2 kg)    Wt Readings from Last 3 Encounters:  02/17/18 (!) 309 lb (140.2 kg)  12/26/17 (!) 309 lb 6.4 oz (140.3 kg)  11/19/17 (!) 304 lb (137.9 kg)   BP Readings from Last 3 Encounters:  02/17/18 (!) 175/97  12/26/17 128/78  11/19/17 138/86   Pulse Readings from Last 3 Encounters:  02/17/18 68  12/26/17 75  11/19/17 82   BMI Readings from Last 3 Encounters:  02/17/18 41.91 kg/m  12/26/17 41.96 kg/m  11/19/17 41.23 kg/m     Patient Care Team    Relationship Specialty Notifications Start End  Mellody Dance, DO PCP - General Family Medicine  10/08/16   Lucia Bitter., MD Physician Assistant Pain Medicine  11/13/17   Richardo Priest, MD Consulting Physician Cardiology  11/13/17  Patient Active Problem List   Diagnosis Date Noted  . OSA (obstructive sleep apnea) 10/08/2016    Priority: High  . Pulmonary hypertension ([place]) 10/08/2016    Priority: High  . Obesity, Class III, BMI 40-49.9 (morbid obesity) ([place]) 10/08/2016    Priority: High  . h/o Stroke 10/08/2016    Priority: Medium  . Chronic combined systolic and diastolic heart failure ([place]) 10/08/2016    Priority: Medium  . Chronic atrial fibrillation ([place]) 10/08/2016    Priority: Medium  . Chronic anticoagulation 10/08/2016    Priority: Medium  . Generalized OA 10/08/2016    Priority: Low  . Foot drop- R 10/08/2016    Priority: Low  . Lumbar spinal stenosis 10/08/2016    Priority: Low  . Lumbosacral radiculopathy 01/22/2018  . Osteoarthritis 11/19/2017  . Vitamin D deficiency 11/19/2017  . Positive for microalbuminuria 11/19/2017  . Hypertensive  heart disease with heart failure ([place]) 11/10/2017  . 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 fascicular block) 11/07/2017  . Aortic stenosis 11/06/2017  . Hypertension   . H/O noncompliance with medical treatment, presenting hazards to health 10/22/2017    Past Medical history, Surgical history, Family history, Social history, Allergies and Medications have been entered into the medical record, reviewed and changed as needed.    Current Meds  Medication Sig  . apixaban (ELIQUIS) 5 MG TABS tablet Take 1 tablet (5 mg total) by mouth 2 (two) times daily.  . bumetanide (BUMEX) 2 MG tablet Take 1 tablet (2 mg total) by mouth 2 (two) times daily.  . carvedilol (COREG) 25 MG tablet Take 1 tablet (25 mg total) by mouth 2 (two) times daily with a meal.  . HYDROcodone-acetaminophen (NORCO) 10-325 MG tablet Take 1 tablet by mouth as needed.  . Multiple Vitamin (MULTIVITAMIN) tablet Take 1 tablet by mouth daily.  . potassium chloride (K-DUR,KLOR-CON) 10 MEQ tablet Take 2 tablets (20 mEq total) by mouth 2 (two) times daily.  Marland Kitchen telmisartan (MICARDIS) 80 MG tablet Take 1 tablet (80 mg total) by mouth daily.  . Vitamin D, Ergocalciferol, (DRISDOL) 50000 units CAPS capsule Take 1 capsule (50,000 Units total) by mouth every 7 (seven) days.    Allergies:  No Known Allergies   Review of Systems:  A fourteen system review of systems was performed and found to be positive as per HPI.   Objective:   Blood pressure (!) 175/97, pulse 68, height 6' (1.829 m), weight (!) 309 lb (140.2 kg), SpO2 98 %. Body mass index is 41.91 kg/m. General:  Well Developed, well nourished, appropriate for stated age.  Neuro:  Alert and oriented,  extra-ocular muscles intact  HEENT:  Normocephalic, atraumatic, neck supple, no carotid bruits appreciated  Skin:  no gross rash, warm, pink. Cardiac:  RRR, S1 S2 Respiratory:  ECTA B/L and A/P, Not using accessory muscles, speaking in full sentences-  unlabored. Vascular:  Ext warm, no cyanosis apprec.; cap RF less 2 sec. Psych:  No HI/SI, judgement and insight good, Euthymic mood. Full Affect.

## 2018-02-17 NOTE — Patient Instructions (Signed)
Please continue to monitor your blood pressure at home and keep a log.  [name] can you please give them another blood pressure log for their records and as they requested another one and found the last one very useful.

## 2018-03-03 DIAGNOSIS — M48062 Spinal stenosis, lumbar region with neurogenic claudication: Secondary | ICD-10-CM | POA: Diagnosis not present

## 2018-03-03 DIAGNOSIS — M545 Low back pain, unspecified: Secondary | ICD-10-CM | POA: Insufficient documentation

## 2018-03-03 DIAGNOSIS — M5441 Lumbago with sciatica, right side: Secondary | ICD-10-CM

## 2018-03-03 DIAGNOSIS — G8929 Other chronic pain: Secondary | ICD-10-CM | POA: Diagnosis not present

## 2018-03-03 DIAGNOSIS — M21371 Foot drop, right foot: Secondary | ICD-10-CM | POA: Diagnosis not present

## 2018-03-03 DIAGNOSIS — G894 Chronic pain syndrome: Secondary | ICD-10-CM | POA: Diagnosis not present

## 2018-03-19 DIAGNOSIS — I482 Chronic atrial fibrillation: Secondary | ICD-10-CM | POA: Diagnosis not present

## 2018-03-19 DIAGNOSIS — E7800 Pure hypercholesterolemia, unspecified: Secondary | ICD-10-CM | POA: Diagnosis not present

## 2018-03-19 DIAGNOSIS — I10 Essential (primary) hypertension: Secondary | ICD-10-CM | POA: Diagnosis not present

## 2018-03-19 DIAGNOSIS — M179 Osteoarthritis of knee, unspecified: Secondary | ICD-10-CM | POA: Diagnosis not present

## 2018-04-08 DIAGNOSIS — G8929 Other chronic pain: Secondary | ICD-10-CM | POA: Diagnosis not present

## 2018-04-08 DIAGNOSIS — M5417 Radiculopathy, lumbosacral region: Secondary | ICD-10-CM | POA: Diagnosis not present

## 2018-04-08 DIAGNOSIS — I509 Heart failure, unspecified: Secondary | ICD-10-CM | POA: Diagnosis not present

## 2018-04-08 DIAGNOSIS — M5441 Lumbago with sciatica, right side: Secondary | ICD-10-CM | POA: Diagnosis not present

## 2018-04-08 DIAGNOSIS — I110 Hypertensive heart disease with heart failure: Secondary | ICD-10-CM | POA: Diagnosis not present

## 2018-04-08 DIAGNOSIS — M1990 Unspecified osteoarthritis, unspecified site: Secondary | ICD-10-CM | POA: Diagnosis not present

## 2018-04-08 DIAGNOSIS — I4891 Unspecified atrial fibrillation: Secondary | ICD-10-CM | POA: Diagnosis not present

## 2018-04-08 DIAGNOSIS — G4733 Obstructive sleep apnea (adult) (pediatric): Secondary | ICD-10-CM | POA: Diagnosis not present

## 2018-04-30 ENCOUNTER — Ambulatory Visit (INDEPENDENT_AMBULATORY_CARE_PROVIDER_SITE_OTHER): Payer: Medicare Other | Admitting: Family Medicine

## 2018-04-30 ENCOUNTER — Encounter: Payer: Self-pay | Admitting: Family Medicine

## 2018-04-30 VITALS — BP 119/73 | HR 71 | Ht 72.0 in | Wt 309.0 lb

## 2018-04-30 DIAGNOSIS — J020 Streptococcal pharyngitis: Secondary | ICD-10-CM | POA: Diagnosis not present

## 2018-04-30 DIAGNOSIS — J019 Acute sinusitis, unspecified: Secondary | ICD-10-CM | POA: Diagnosis not present

## 2018-04-30 DIAGNOSIS — R05 Cough: Secondary | ICD-10-CM

## 2018-04-30 DIAGNOSIS — I25119 Atherosclerotic heart disease of native coronary artery with unspecified angina pectoris: Secondary | ICD-10-CM

## 2018-04-30 DIAGNOSIS — R059 Cough, unspecified: Secondary | ICD-10-CM

## 2018-04-30 MED ORDER — AMOXICILLIN 500 MG PO CAPS: 1000.0000 mg | ORAL_CAPSULE | Freq: Two times a day (BID) | ORAL | 0 refills | Status: DC

## 2018-04-30 MED ORDER — PREDNISONE 20 MG PO TABS: ORAL_TABLET | ORAL | 0 refills | Status: DC

## 2018-04-30 MED ORDER — HYDROCOD POLST-CPM POLST ER 10-8 MG/5ML PO SUER: 5.0000 mL | Freq: Two times a day (BID) | ORAL | 0 refills | Status: DC | PRN

## 2018-04-30 NOTE — Progress Notes (Signed)
Acute Care Office visit  Assessment and plan:  1. Strep pharyngitis   2. Cough   3. Acute sinusitis, recurrence not specified, unspecified location    1. Strep pharyngitis/cough/acute sinusitis -start abx.  -start tussionex.  -start prednisone.  -wear N 95 mask, especially when going out into env/seasonal allergens.   -push fluids, get plenty of rest.   - Viral vs Allergic vs Bacterial causes for pt's symptoms reveiwed.    - Supportive care and various OTC medications discussed in addition to any prescribed. - Call or RTC if new symptoms, or if no improvement or worse over next several days.     Meds ordered this encounter  Medications  . predniSONE (DELTASONE) 20 MG tablet    Sig: Take 3 pills a day for 2 days, 2 pills a day for 2 days, 1 pill a day for 2 days then one half pill a day for 2 days then off    Dispense:  14 tablet    Refill:  0  . chlorpheniramine-HYDROcodone (TUSSIONEX) 10-8 MG/5ML SUER    Sig: Take 5 mLs by mouth every 12 (twelve) hours as needed for cough (cough, will cause drowsiness.).    Dispense:  200 mL    Refill:  0  . amoxicillin (AMOXIL) 500 MG capsule    Sig: Take 2 capsules (1,000 mg total) by mouth 2 (two) times daily.    Dispense:  40 capsule    Refill:  0    No orders of the defined types were placed in this encounter.   Gross side effects, risk and benefits, and alternatives of medications discussed with patient.  Patient is aware that all medications have potential side effects and we are unable to predict every sideeffect or drug-drug interaction that may occur.  Expresses verbal understanding and consents to current therapy plan and treatment regiment.   Education and routine counseling performed. Handouts provided.  Anticipatory guidance and routine counseling done re: condition, txmnt options and need for follow up. All questions of patient's were answered.  Return if symptoms worsen or fail to improve.  Please see AVS handed  out to patient at the end of our visit for additional patient instructions/ counseling done pertaining to today's office visit.  Note: This document was partially repared using Dragon voice recognition software and may include unintentional dictation errors.  This document serves as a record of services personally performed by Mellody Dance, DO. It was created on her behalf by Mayer Masker, a trained medical scribe. The creation of this record is based on the scribe's personal observations and the provider's statements to them.   I have reviewed the above medical documentation for accuracy and completeness and I concur.  Mellody Dance 05/03/18 11:50 PM   Subjective:    Chief Complaint  Patient presents with  . Sinus Problem    HPI:  Pt presents with Sx for 3-4 days   C/o: cough, difficulty breathing, chest and sinus congestion, rhinorrhea, post-nasal drip, wheezing, SOB, and sore throat.  His sx is worse in the morning.   Denies: fever, chills, one-sided facial pain,     For symptoms patient has tried:  NA  Overall getting:   Worse  He sleeps with a CPAP machine.    He has positive sick contacts in sore throat.   Patient Care Team    Relationship Specialty Notifications Start End  Mellody Dance, DO PCP - General Family Medicine  10/08/16   Lucia Bitter., MD Physician  Assistant Pain Medicine  11/13/17   Richardo Priest, MD Consulting Physician Cardiology  11/13/17     Past medical history, Surgical history, Family history reviewed and noted below, Social history, Allergies, and Medications have been entered into the medical record, reviewed and changed as needed.   No Known Allergies  Review of Systems: - see above HPI for pertinent positives General:   No F/C, wt loss Pulm:   No pleuritic chest pain Card:  No CP, palpitations Abd:  No n/v/d or pain Ext:  No inc edema from baseline   Objective:   Blood pressure 119/73, pulse 71, height 6' (1.829 m),  weight (!) 309 lb (140.2 kg), SpO2 97 %. Body mass index is 41.91 kg/m. General: Well Developed, well nourished, appropriate for stated age.  Neuro: Alert and oriented x3, extra-ocular muscles intact, sensation grossly intact.  HEENT: Normocephalic, atraumatic, pupils equal round reactive to light, neck supple, no masses, no painful lymphadenopathy, TM's intact B/L, no acute findings. Nares- patent, clear d/c, OP- clear,  Significant erythema and petechiae present.  No TTP sinuses Skin: Warm and dry, no gross rash. Cardiac: RRR, S1 S2,  no murmurs rubs or gallops.  Respiratory: ECTA B/L and A/P, Not using accessory muscles, speaking in full sentences- unlabored. Vascular:  No gross lower ext edema, cap RF less 2 sec. Psych: No HI/SI, judgement and insight good, Euthymic mood. Full Affect.

## 2018-04-30 NOTE — Patient Instructions (Signed)
All meds as prescribed.  Please follow-up for chronic care as we discussed prior.  Please make sure you wear an and 95 mask while mowing or exposed to environmental allergies as well as do a Neomed sinus rinse or a Y are sinus rinse after exposure.

## 2018-05-11 ENCOUNTER — Other Ambulatory Visit: Payer: Self-pay | Admitting: Family Medicine

## 2018-05-11 DIAGNOSIS — I10 Essential (primary) hypertension: Secondary | ICD-10-CM

## 2018-05-21 DIAGNOSIS — M47816 Spondylosis without myelopathy or radiculopathy, lumbar region: Secondary | ICD-10-CM | POA: Diagnosis not present

## 2018-05-21 DIAGNOSIS — M5441 Lumbago with sciatica, right side: Secondary | ICD-10-CM | POA: Diagnosis not present

## 2018-05-21 DIAGNOSIS — G894 Chronic pain syndrome: Secondary | ICD-10-CM | POA: Diagnosis not present

## 2018-05-21 DIAGNOSIS — M21371 Foot drop, right foot: Secondary | ICD-10-CM | POA: Diagnosis not present

## 2018-06-19 ENCOUNTER — Encounter: Payer: Self-pay | Admitting: Family Medicine

## 2018-06-19 ENCOUNTER — Ambulatory Visit (INDEPENDENT_AMBULATORY_CARE_PROVIDER_SITE_OTHER): Payer: Medicare Other | Admitting: Family Medicine

## 2018-06-19 VITALS — BP 148/86 | HR 73 | Ht 72.0 in | Wt 309.0 lb

## 2018-06-19 DIAGNOSIS — I639 Cerebral infarction, unspecified: Secondary | ICD-10-CM

## 2018-06-19 DIAGNOSIS — I482 Chronic atrial fibrillation, unspecified: Secondary | ICD-10-CM

## 2018-06-19 DIAGNOSIS — I10 Essential (primary) hypertension: Secondary | ICD-10-CM | POA: Diagnosis not present

## 2018-06-19 DIAGNOSIS — I25119 Atherosclerotic heart disease of native coronary artery with unspecified angina pectoris: Secondary | ICD-10-CM | POA: Diagnosis not present

## 2018-06-19 DIAGNOSIS — I509 Heart failure, unspecified: Secondary | ICD-10-CM

## 2018-06-19 DIAGNOSIS — R809 Proteinuria, unspecified: Secondary | ICD-10-CM | POA: Diagnosis not present

## 2018-06-19 NOTE — Progress Notes (Signed)
Impression and Recommendations:    1. Hypertension, unspecified type   2. Positive for microalbuminuria   3. Obesity, Class III, BMI 40-49.9 (morbid obesity) ([place])   4. Cerebrovascular accident (CVA), unspecified mechanism ([place])   5. Atherosclerosis of native coronary artery with angina pectoris, unspecified whether native or transplanted heart ([place])   6. Congestive heart failure, unspecified HF chronicity, unspecified heart failure type ([place])   7. Chronic atrial fibrillation (HCC)     HTN -Discussed red flag symptoms and encouraged pt to go to the emergency department if they are present -Reiterated importance of taking all prescribed medications for blood pressure; discussed how to manage symptoms if bp drops  -provided written instructions -Requested patient bring bp log with next OV - Has Cardiologist -Will defer to Dr. Bettina Gavia in cardiology for med mgt of her BP and other CV diseases which pt prefers  RLE Pain -Discussed nerve stim implant procedure with patient since I used to do these  - encouraged pt to discuss specifics of procedure specific concerns with pain management specialists -Discussed possibility of physical therapy for bilateral LE strengthening; patient may requested referral after return from [place]- she will call and let us know if she would like   BMI -Encouraged weight loss goal for next appointment; patient declined -Explained to patient what BMI refers to, and what it means medically. Told patient to think about it as a "medical risk stratification measurement" and how increasing BMI is associated with increasing risk/ or worsening state of various diseases such as hypertension, hyperlipidemia, diabetes, premature OA, depression etc.  -American Heart Association guidelines for healthy diet, basically Mediterranean diet, and exercise guidelines of 30 minutes 5 days per week or more discussed in detail.  -Health counseling performed.  All questions  answered.  Vaccinations -Discussed Flu season beginning in October and encouraged patients to return for vaccination -Discussed vaccine R/B   Education and routine counseling performed. Handouts provided.   The patient was counseled, risk factors were discussed, anticipatory guidance given.  Gross side effects, risk and benefits, and alternatives of medications discussed with patient.  Patient is aware that all medications have potential side effects and we are unable to predict every side effect or drug-drug interaction that may occur.  Expresses verbal understanding and consents to current therapy plan and treatment regimen.  Return let us know if you want PT referral , for 56mo- chronic f/up for BP, etc.  Please see AVS handed out to patient at the end of our visit for further patient instructions/ counseling done pertaining to today's office visit.    Note:  This document was prepared using Dragon voice recognition software and may include unintentional dictation errors.    This document serves as a record of services personally performed by Mellody Dance, MD. It was created on her behalf by Georga Bora, a trained medical scribe. The creation of this record is based on the scribe's personal observations and the provider's statements to them.   I have reviewed the above medical documentation for accuracy and completeness and I concur.  Mellody Dance 06/19/18 6:11 PM    Subjective:    HPI: Joel Dominguez Dominguez is a 67 y.o. male who presents to [place] [facility] today for follow up for HTN.      HTN:  -  His blood pressure has not been controlled at home.  -Patient has been checking his blood pressure regularly at home  -Patient states he had several  days where it was around 90/48 and experienced dizziness and some SE from low blood pressure -States he checks his bp each day and would skip medications if his bp was too low -Takes carvedilol and eliquis  regardless of bp -Believes it may have been due to multiple days of hard work in the yard  -States that aside from those few days, his bp around 120's/80s; forgot bp log this OV   - Smoking Status noted   - He denies new onset of: chest pain, exercise intolerance, shortness of breath, headache, lower extremity swelling or claudication.   Lifestyle -Patient and spouse enjoy working in their yard and caring for their grandchildren -Says he enjoys Technical brewer and sangria but hasn't been drinking it recently  -Wife and pt are driving their RV to [place] next week -Pt is excited about "boy's night" with his son and grandson tonight  BMI -States he has not been focusing on weight loss and diet in past few weeks -Believes eating out regularly has been stalling weight loss -Wife and he will start working again towards weight loss "when we get back from [place] -Lipid panel WNL in Dec 2018 -Microalbumin/creatinine at 86.07 Oct 2017  RLE Pain -Pt asked about nerve stimulus; states he has reservations with "sticking something into my spine" -States he has been experiencing RLE weakness and has some concerns   Last 3 blood pressure readings in our office are as follows: BP Readings from Last 3 Encounters:  06/19/18 (!) 148/86  04/30/18 119/73  02/17/18 (!) 175/97    Pulse Readings from Last 3 Encounters:  06/19/18 73  04/30/18 71  02/17/18 68    Filed Weights   06/19/18 1041  Weight: (!) 309 lb (140.2 kg)      Patient Care Team    Relationship Specialty Notifications Start End  Mellody Dance, DO PCP - General Family Medicine  10/08/16   Lucia Bitter., MD Physician Assistant Pain Medicine  11/13/17   Richardo Priest, MD Consulting Physician Cardiology  11/13/17      Lab Results  Component Value Date   CREATININE 0.94 10/22/2017   BUN 12 10/22/2017   NA 141 10/22/2017   K 3.6 10/22/2017   CL 99 10/22/2017   CO2 27 10/22/2017    Lab Results  Component  Value Date   CHOL 161 10/22/2017   CHOL 150 07/08/2017   CHOL 134 03/11/2017    Lab Results  Component Value Date   HDL 43 10/22/2017   HDL 36 07/08/2017   HDL 34 (A) 03/11/2017    Lab Results  Component Value Date   LDLCALC 92 10/22/2017   LDLCALC 84 07/08/2017   LDLCALC 73 03/11/2017    Lab Results  Component Value Date   TRIG 129 10/22/2017   TRIG 150 07/08/2017   TRIG 136 03/11/2017    Lab Results  Component Value Date   CHOLHDL 3.7 10/22/2017   CHOLHDL 4.3 10/08/2016    No results found for: LDLDIRECT ===================================================================   Patient Active Problem List   Diagnosis Date Noted  . OSA (obstructive sleep apnea) 10/08/2016    Priority: High  . Pulmonary hypertension ([place]) 10/08/2016    Priority: High  . Obesity, Class III, BMI 40-49.9 (morbid obesity) ([place]) 10/08/2016    Priority: High  . h/o Stroke 10/08/2016    Priority: Medium  . Chronic combined systolic and diastolic heart failure ([place]) 10/08/2016    Priority: Medium  . Chronic atrial fibrillation (  [place]) 10/08/2016    Priority: Medium  . Chronic anticoagulation 10/08/2016    Priority: Medium  . Generalized OA 10/08/2016    Priority: Low  . Foot drop- R 10/08/2016    Priority: Low  . Lumbar spinal stenosis 10/08/2016    Priority: Low  . Chronic bilateral low back pain with right-sided sciatica 03/03/2018  . Lumbosacral radiculopathy 01/22/2018  . Osteoarthritis 11/19/2017  . Vitamin D deficiency 11/19/2017  . Positive for microalbuminuria 11/19/2017  . Hypertensive heart disease with heart failure ([place]) 11/10/2017  . 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 fascicular block) 11/07/2017  . Aortic stenosis 11/06/2017  . Hypertension   . H/O noncompliance with medical treatment, presenting hazards to health 10/22/2017     Past Medical History:  Diagnosis Date  . A-fib- couple yrs now. 10/08/2016  . Aortic stenosis 11/06/2017  .  Atherosclerotic heart disease of native coronary artery with unspecified angina pectoris ([place]) 10/08/2016  . CHF (congestive heart failure) ([place])   . Chronic anticoagulation 10/08/2016  . Foot drop- R 10/08/2016   Patient known foot drop for many years due to lumbar spinal stenosis.-Over the past couple of months symptoms have gotten worse.  He feels he is tripping more and cannot lift his foot up as much. Toes are curling and calluses on end of toes.    - Did see spinal surgeon many yrs ago.  Declined sx at that time.     . Generalized OA 10/08/2016   Ortho doc-  Dr. [facility] in [place]- both knees replaced 08, 09.     . H/O noncompliance with medical treatment, presenting hazards to health 10/22/2017  . Heart disease   . Hypertension   . Obesity, Class III, BMI 40-49.9 (morbid obesity) ([place]) 10/08/2016  . OSA (obstructive sleep apnea) 10/08/2016   Bi-PAP nitely- been 20+ yrs now  . Pulmonary hypertension ([place]) 10/08/2016  . Spinal stenosis, lumbar region, with neurogenic claudication 10/08/2016   Back specialist in [place]-    only surgeon he would use- pt declined referral here for back pain mgt    . Stroke [facility])      Past Surgical History:  Procedure Laterality Date  . REPLACEMENT TOTAL KNEE Bilateral   . TIBIA FRACTURE SURGERY       Family History  Problem Relation Age of Onset  . Congestive Heart Failure Mother   . Hypertension Mother   . Diabetes Mother   . Cancer Father        lung  . Congestive Heart Failure Sister   . Hypertension Sister   . Hypertension Brother   . Hypertension Sister   . Diabetes Brother      Social History   Substance and Sexual Activity  Drug Use No  ,  Social History   Substance and Sexual Activity  Alcohol Use Yes   Comment: once monthly  ,  Social History   Tobacco Use  Smoking Status Never Smoker  Smokeless Tobacco Never Used  ,    Current Outpatient Medications on File Prior to Visit  Medication Sig Dispense Refill  . apixaban  (ELIQUIS) 5 MG TABS tablet Take 1 tablet (5 mg total) by mouth 2 (two) times daily. 180 tablet 3  . bumetanide (BUMEX) 2 MG tablet Take 1 tablet (2 mg total) by mouth 2 (two) times daily. 180 tablet 3  . carvedilol (COREG) 25 MG tablet Take 1 tablet (25 mg total) by mouth 2 (two) times daily with a meal. 180 tablet  3  . HYDROcodone-acetaminophen (NORCO) 10-325 MG tablet Take 1 tablet by mouth as needed.    . Multiple Vitamin (MULTIVITAMIN) tablet Take 1 tablet by mouth daily.    . potassium chloride (K-DUR,KLOR-CON) 10 MEQ tablet Take 2 tablets (20 mEq total) by mouth 2 (two) times daily. 180 tablet 3  . spironolactone (ALDACTONE) 25 MG tablet TAKE 1 TABLET BY MOUTH DAILY. 90 tablet 0  . telmisartan (MICARDIS) 80 MG tablet Take 1 tablet (80 mg total) by mouth daily. 90 tablet 3  . Vitamin D, Ergocalciferol, (DRISDOL) 50000 units CAPS capsule Take 1 capsule (50,000 Units total) by mouth every 7 (seven) days. 12 capsule 10   No current facility-administered medications on file prior to visit.      No Known Allergies   Review of Systems:   General:  Denies fever, chills Optho/Auditory:   Denies visual changes, blurred vision Respiratory:   Denies SOB, cough, wheeze, DIB  Cardiovascular:   Denies chest pain, palpitations, painful respirations Gastrointestinal:   Denies nausea, vomiting, diarrhea.  Endocrine:     Denies new hot or cold intolerance Musculoskeletal:  Denies joint swelling, gait issues, or new unexplained myalgias/ arthralgias Skin:  Denies rash, suspicious lesions  Neurological:    Denies dizziness, unexplained weakness, numbness  Psychiatric/Behavioral:   Denies mood changes  Objective:    Blood pressure (!) 148/86, pulse 73, height 6' (1.829 m), weight (!) 309 lb (140.2 kg), SpO2 97 %.  Body mass index is 41.91 kg/m.  General: Well Developed, well nourished, and in no acute distress.  HEENT: Normocephalic, atraumatic, pupils equal round reactive to light, neck supple,  No carotid bruits, no JVD Skin: Warm and dry, cap RF less 2 sec Cardiac: Regular rate and rhythm, S1, S2 WNL's, no murmurs rubs or gallops Respiratory: ECTA B/L, Not using accessory muscles, speaking in full sentences. NeuroM-Sk: Ambulates w/o assistance, moves ext * 4 w/o difficulty, sensation grossly intact.  Ext: scant edema b/l lower ext Psych: No HI/SI, judgement and insight good, Euthymic mood. Full Affect.

## 2018-06-19 NOTE — Patient Instructions (Signed)
In future, if you're working outdoors a lot and blood pressure appears to be running lower, I would advice cutting back  (take 1/2 tab) on Bumex spironolactone and carvedilol temporarily especially if symptomatic (dizzy, etc)

## 2018-07-15 ENCOUNTER — Encounter: Payer: Self-pay | Admitting: Family Medicine

## 2018-07-17 ENCOUNTER — Other Ambulatory Visit: Payer: Self-pay

## 2018-07-17 DIAGNOSIS — M21371 Foot drop, right foot: Secondary | ICD-10-CM

## 2018-07-17 NOTE — Progress Notes (Signed)
Referral to PT for right foot drop ordered per Dr. Raliegh Scarlet. MPulliam, CMA/RT(R)

## 2018-07-22 DIAGNOSIS — Z5181 Encounter for therapeutic drug level monitoring: Secondary | ICD-10-CM | POA: Diagnosis not present

## 2018-07-22 DIAGNOSIS — M47816 Spondylosis without myelopathy or radiculopathy, lumbar region: Secondary | ICD-10-CM | POA: Diagnosis not present

## 2018-07-22 DIAGNOSIS — Z79899 Other long term (current) drug therapy: Secondary | ICD-10-CM | POA: Diagnosis not present

## 2018-07-22 DIAGNOSIS — M5441 Lumbago with sciatica, right side: Secondary | ICD-10-CM | POA: Diagnosis not present

## 2018-07-22 DIAGNOSIS — G8929 Other chronic pain: Secondary | ICD-10-CM | POA: Diagnosis not present

## 2018-07-22 DIAGNOSIS — G894 Chronic pain syndrome: Secondary | ICD-10-CM | POA: Diagnosis not present

## 2018-07-22 DIAGNOSIS — M21371 Foot drop, right foot: Secondary | ICD-10-CM | POA: Diagnosis not present

## 2018-08-04 ENCOUNTER — Other Ambulatory Visit: Payer: Self-pay

## 2018-08-04 DIAGNOSIS — M21379 Foot drop, unspecified foot: Secondary | ICD-10-CM

## 2018-08-04 DIAGNOSIS — M21371 Foot drop, right foot: Secondary | ICD-10-CM

## 2018-08-04 DIAGNOSIS — M48061 Spinal stenosis, lumbar region without neurogenic claudication: Secondary | ICD-10-CM

## 2018-08-05 DIAGNOSIS — Z96651 Presence of right artificial knee joint: Secondary | ICD-10-CM | POA: Diagnosis not present

## 2018-08-05 DIAGNOSIS — R531 Weakness: Secondary | ICD-10-CM | POA: Diagnosis not present

## 2018-08-05 DIAGNOSIS — M25669 Stiffness of unspecified knee, not elsewhere classified: Secondary | ICD-10-CM | POA: Diagnosis not present

## 2018-08-05 DIAGNOSIS — M21371 Foot drop, right foot: Secondary | ICD-10-CM | POA: Diagnosis not present

## 2018-08-06 DIAGNOSIS — M21371 Foot drop, right foot: Secondary | ICD-10-CM | POA: Diagnosis not present

## 2018-08-06 DIAGNOSIS — M25669 Stiffness of unspecified knee, not elsewhere classified: Secondary | ICD-10-CM | POA: Diagnosis not present

## 2018-08-06 DIAGNOSIS — R531 Weakness: Secondary | ICD-10-CM | POA: Diagnosis not present

## 2018-08-06 DIAGNOSIS — Z96651 Presence of right artificial knee joint: Secondary | ICD-10-CM | POA: Diagnosis not present

## 2018-08-08 ENCOUNTER — Other Ambulatory Visit: Payer: Self-pay | Admitting: Family Medicine

## 2018-08-08 DIAGNOSIS — I10 Essential (primary) hypertension: Secondary | ICD-10-CM

## 2018-08-11 DIAGNOSIS — Z96651 Presence of right artificial knee joint: Secondary | ICD-10-CM | POA: Diagnosis not present

## 2018-08-11 DIAGNOSIS — M25669 Stiffness of unspecified knee, not elsewhere classified: Secondary | ICD-10-CM | POA: Diagnosis not present

## 2018-08-11 DIAGNOSIS — R531 Weakness: Secondary | ICD-10-CM | POA: Diagnosis not present

## 2018-08-11 DIAGNOSIS — M21371 Foot drop, right foot: Secondary | ICD-10-CM | POA: Diagnosis not present

## 2018-08-13 DIAGNOSIS — M25669 Stiffness of unspecified knee, not elsewhere classified: Secondary | ICD-10-CM | POA: Diagnosis not present

## 2018-08-13 DIAGNOSIS — Z96651 Presence of right artificial knee joint: Secondary | ICD-10-CM | POA: Diagnosis not present

## 2018-08-13 DIAGNOSIS — M21371 Foot drop, right foot: Secondary | ICD-10-CM | POA: Diagnosis not present

## 2018-08-13 DIAGNOSIS — R531 Weakness: Secondary | ICD-10-CM | POA: Diagnosis not present

## 2018-08-18 DIAGNOSIS — M21371 Foot drop, right foot: Secondary | ICD-10-CM | POA: Diagnosis not present

## 2018-08-18 DIAGNOSIS — Z96651 Presence of right artificial knee joint: Secondary | ICD-10-CM | POA: Diagnosis not present

## 2018-08-18 DIAGNOSIS — M25669 Stiffness of unspecified knee, not elsewhere classified: Secondary | ICD-10-CM | POA: Diagnosis not present

## 2018-08-18 DIAGNOSIS — R531 Weakness: Secondary | ICD-10-CM | POA: Diagnosis not present

## 2018-08-20 DIAGNOSIS — M25669 Stiffness of unspecified knee, not elsewhere classified: Secondary | ICD-10-CM | POA: Diagnosis not present

## 2018-08-20 DIAGNOSIS — R531 Weakness: Secondary | ICD-10-CM | POA: Diagnosis not present

## 2018-08-20 DIAGNOSIS — Z96651 Presence of right artificial knee joint: Secondary | ICD-10-CM | POA: Diagnosis not present

## 2018-08-20 DIAGNOSIS — M21371 Foot drop, right foot: Secondary | ICD-10-CM | POA: Diagnosis not present

## 2018-08-25 DIAGNOSIS — M25669 Stiffness of unspecified knee, not elsewhere classified: Secondary | ICD-10-CM | POA: Diagnosis not present

## 2018-08-25 DIAGNOSIS — Z96651 Presence of right artificial knee joint: Secondary | ICD-10-CM | POA: Diagnosis not present

## 2018-08-25 DIAGNOSIS — M21371 Foot drop, right foot: Secondary | ICD-10-CM | POA: Diagnosis not present

## 2018-08-25 DIAGNOSIS — R531 Weakness: Secondary | ICD-10-CM | POA: Diagnosis not present

## 2018-08-27 NOTE — Progress Notes (Signed)
Cardiology Office Note:    Date:  08/28/2018   ID:  Joel Dominguez, DOB 05/13/1951, MRN [id number]  PCP:  Mellody Dance, DO  Cardiologist:  Shirlee More, MD    Referring MD: Mellody Dance, DO    ASSESSMENT:    1. Chronic combined systolic and diastolic heart failure ([place])   2. Hypertension, unspecified type   3. Painful urination   4. Chronic anticoagulation   5. Chronic atrial fibrillation    PLAN:    In order of problems listed above:  1. His heart failure is compensated continue his current combined diuretics loop distal and intermittent metolazone along with beta-blocker and ARB.  Will address the issue of an echocardiogram next visit .  Overdue and check proBNP and BMP today. 2. Improved blood pressure target continue his current ARB 3. Check UA regarding kidney stone 4. Continue his anticoagulant 5. For rate control continue beta-blocker   Next appointment: 6 months   Medication Adjustments/Labs and Tests Ordered: Current medicines are reviewed at length with the patient today.  Concerns regarding medicines are outlined above.  Orders Placed This Encounter  Procedures  . Basic Metabolic Panel (BMET)  . Pro b natriuretic peptide (BNP)  . Urinalysis   No orders of the defined types were placed in this encounter.   Chief Complaint  Patient presents with  . Follow-up    History of Present Illness:    Joel Dominguez is a 67 y.o. male with a hx of chronic atrial fibrillation, mild aortic stenosis, heart failure, RBBB with LAHB, hypertension, anticoagulation and PAH  last seen 12/26/17. Compliance with diet, lifestyle and medications: Yes He has done better blood pressures at target and intermittently needs to take metolazone to maintain volume status.  No edema shortness of breath orthopnea chest pain.  He wonders if he has a kidney stone request me to check a UA I will do that affect normal said to his PCP and he is overdue to check renal function and  proBNP today. Past Medical History:  Diagnosis Date  . A-fib- couple yrs now. 10/08/2016  . Aortic stenosis 11/06/2017  . Atherosclerotic heart disease of native coronary artery with unspecified angina pectoris ([place]) 10/08/2016  . CHF (congestive heart failure) ([place])   . Chronic anticoagulation 10/08/2016  . Foot drop- R 10/08/2016   Patient known foot drop for many years due to lumbar spinal stenosis.-Over the past couple of months symptoms have gotten worse.  He feels he is tripping more and cannot lift his foot up as much. Toes are curling and calluses on end of toes.    - Did see spinal surgeon many yrs ago.  Declined sx at that time.     . Generalized OA 10/08/2016   Ortho doc-  Dr. [facility] in [place]- both knees replaced 08, 09.     . H/O noncompliance with medical treatment, presenting hazards to health 10/22/2017  . Heart disease   . Hypertension   . Obesity, Class III, BMI 40-49.9 (morbid obesity) ([place]) 10/08/2016  . OSA (obstructive sleep apnea) 10/08/2016   Bi-PAP nitely- been 20+ yrs now  . Pulmonary hypertension ([place]) 10/08/2016  . Spinal stenosis, lumbar region, with neurogenic claudication 10/08/2016   Back specialist in [place]-    only surgeon he would use- pt declined referral here for back pain mgt    . Stroke [facility])     Past Surgical History:  Procedure Laterality Date  . REPLACEMENT TOTAL KNEE Bilateral   . TIBIA FRACTURE SURGERY  Current Medications: No outpatient medications have been marked as taking for the 08/28/18 encounter (Office Visit) with Richardo Priest, MD.     Allergies:   Patient has no known allergies.   Social History   Socioeconomic History  . Marital status: Married    Spouse name: Not on file  . Number of children: Not on file  . Years of education: Not on file  . Highest education level: Not on file  Occupational History  . Not on file  Social Needs  . Financial resource strain: Not on file  . Food insecurity:    Worry: Not on file     Inability: Not on file  . Transportation needs:    Medical: Not on file    Non-medical: Not on file  Tobacco Use  . Smoking status: Never Smoker  . Smokeless tobacco: Never Used  Substance and Sexual Activity  . Alcohol use: Yes    Comment: once monthly  . Drug use: No  . Sexual activity: Never  Lifestyle  . Physical activity:    Days per week: Not on file    Minutes per session: Not on file  . Stress: Not on file  Relationships  . Social connections:    Talks on phone: Not on file    Gets together: Not on file    Attends religious service: Not on file    Active member of club or organization: Not on file    Attends meetings of clubs or organizations: Not on file    Relationship status: Not on file  Other Topics Concern  . Not on file  Social History Narrative  . Not on file     Family History: The patient's family history includes Cancer in his father; Congestive Heart Failure in his mother and sister; Diabetes in his brother and mother; Hypertension in his brother, mother, sister, and sister. ROS:   Please see the history of present illness.    All other systems reviewed and are negative.  EKGs/Labs/Other Studies Reviewed:    The following studies were reviewed today:   Recent Labs: 10/22/2017: ALT 13; BUN 12; Creatinine, Ser 0.94; Hemoglobin 14.1; Magnesium 2.0; Platelets 249; Potassium 3.6; Sodium 141; TSH 0.745  Recent Lipid Panel    Component Value Date/Time   CHOL 161 10/22/2017 1608   TRIG 129 10/22/2017 1608   HDL 43 10/22/2017 1608   CHOLHDL 3.7 10/22/2017 1608   CHOLHDL 4.3 10/08/2016 1144   VLDL 45 (H) 10/08/2016 1144   LDLCALC 92 10/22/2017 1608    Physical Exam:    VS:  BP 132/74 (BP Location: Right Arm, Patient Position: Sitting, Cuff Size: Large)   Pulse 79   Ht 6' (1.829 m)   Wt (!) 320 lb (145.2 kg)   SpO2 98%   BMI 43.40 kg/m     Wt Readings from Last 3 Encounters:  08/28/18 (!) 320 lb (145.2 kg)  06/19/18 (!) 309 lb (140.2 kg)    04/30/18 (!) 309 lb (140.2 kg)     GEN:  Well nourished, well developed in no acute distress HEENT: Normal NECK: No JVD; No carotid bruits LYMPHATICS: No lymphadenopathy CARDIAC: RRR, no murmurs, rubs, gallops RESPIRATORY:  Clear to auscultation without rales, wheezing or rhonchi  ABDOMEN: Soft, non-tender, non-distended MUSCULOSKELETAL:  No edema; No deformity  SKIN: Warm and dry NEUROLOGIC:  Alert and oriented x 3 PSYCHIATRIC:  Normal affect    Signed, Shirlee More, MD  08/28/2018 11:47 AM    [facility]

## 2018-08-28 ENCOUNTER — Encounter: Payer: Self-pay | Admitting: Cardiology

## 2018-08-28 ENCOUNTER — Ambulatory Visit (INDEPENDENT_AMBULATORY_CARE_PROVIDER_SITE_OTHER): Payer: Medicare Other | Admitting: Cardiology

## 2018-08-28 VITALS — BP 132/74 | HR 79 | Ht 72.0 in | Wt 320.0 lb

## 2018-08-28 DIAGNOSIS — Z7901 Long term (current) use of anticoagulants: Secondary | ICD-10-CM | POA: Diagnosis not present

## 2018-08-28 DIAGNOSIS — R531 Weakness: Secondary | ICD-10-CM | POA: Diagnosis not present

## 2018-08-28 DIAGNOSIS — M21371 Foot drop, right foot: Secondary | ICD-10-CM | POA: Diagnosis not present

## 2018-08-28 DIAGNOSIS — I10 Essential (primary) hypertension: Secondary | ICD-10-CM | POA: Diagnosis not present

## 2018-08-28 DIAGNOSIS — Z96651 Presence of right artificial knee joint: Secondary | ICD-10-CM | POA: Diagnosis not present

## 2018-08-28 DIAGNOSIS — I5042 Chronic combined systolic (congestive) and diastolic (congestive) heart failure: Secondary | ICD-10-CM | POA: Diagnosis not present

## 2018-08-28 DIAGNOSIS — I482 Chronic atrial fibrillation, unspecified: Secondary | ICD-10-CM | POA: Diagnosis not present

## 2018-08-28 DIAGNOSIS — R309 Painful micturition, unspecified: Secondary | ICD-10-CM | POA: Diagnosis not present

## 2018-08-28 DIAGNOSIS — M25669 Stiffness of unspecified knee, not elsewhere classified: Secondary | ICD-10-CM | POA: Diagnosis not present

## 2018-08-28 NOTE — Patient Instructions (Signed)
Medication Instructions:  Your physician recommends that you continue on your current medications as directed. Please refer to the Current Medication list given to you today.  If you need a refill on your cardiac medications before your next appointment, please call your pharmacy.   Lab work: Your physician recommends that you return for lab work today: BMP, ProBNP, urinalysis.  If you have labs (blood work) drawn today and your tests are completely normal, you will receive your results only by: Marland Kitchen MyChart Message (if you have MyChart) OR . A paper copy in the mail If you have any lab test that is abnormal or we need to change your treatment, we will call you to review the results.  Testing/Procedures: None  Follow-Up: At [facility], you and your health needs are our priority.  As part of our continuing mission to provide you with exceptional heart care, we have created designated Provider Care Teams.  These Care Teams include your primary Cardiologist (physician) and Advanced Practice Providers (APPs -  Physician Assistants and Nurse Practitioners) who all work together to provide you with the care you need, when you need it. You will need a follow up appointment in 6 months.  Please call our office 2 months in advance to schedule this appointment.

## 2018-08-29 LAB — URINALYSIS
Bilirubin, UA: NEGATIVE
GLUCOSE, UA: NEGATIVE
Ketones, UA: NEGATIVE
Leukocytes, UA: NEGATIVE
Nitrite, UA: NEGATIVE
PH UA: 5.5 (ref 5.0–7.5)
PROTEIN UA: NEGATIVE
RBC, UA: NEGATIVE
Specific Gravity, UA: 1.022 (ref 1.005–1.030)
Urobilinogen, Ur: 0.2 mg/dL (ref 0.2–1.0)

## 2018-08-29 LAB — PRO B NATRIURETIC PEPTIDE: NT-Pro BNP: 447 pg/mL — ABNORMAL HIGH (ref 0–376)

## 2018-08-29 LAB — BASIC METABOLIC PANEL
BUN/Creatinine Ratio: 19 (ref 10–24)
BUN: 24 mg/dL (ref 8–27)
CHLORIDE: 101 mmol/L (ref 96–106)
CO2: 25 mmol/L (ref 20–29)
Calcium: 9.3 mg/dL (ref 8.6–10.2)
Creatinine, Ser: 1.27 mg/dL (ref 0.76–1.27)
GFR, EST AFRICAN AMERICAN: 67 mL/min/{1.73_m2} (ref >59)
GFR, EST NON AFRICAN AMERICAN: 58 mL/min/{1.73_m2} — AB (ref >59)
Glucose: 103 mg/dL — ABNORMAL HIGH (ref 65–99)
POTASSIUM: 4.4 mmol/L (ref 3.5–5.2)
SODIUM: 142 mmol/L (ref 134–144)

## 2018-09-01 DIAGNOSIS — Z96651 Presence of right artificial knee joint: Secondary | ICD-10-CM | POA: Diagnosis not present

## 2018-09-01 DIAGNOSIS — R531 Weakness: Secondary | ICD-10-CM | POA: Diagnosis not present

## 2018-09-01 DIAGNOSIS — M25669 Stiffness of unspecified knee, not elsewhere classified: Secondary | ICD-10-CM | POA: Diagnosis not present

## 2018-09-01 DIAGNOSIS — M21371 Foot drop, right foot: Secondary | ICD-10-CM | POA: Diagnosis not present

## 2018-09-08 DIAGNOSIS — R531 Weakness: Secondary | ICD-10-CM | POA: Diagnosis not present

## 2018-09-08 DIAGNOSIS — M21371 Foot drop, right foot: Secondary | ICD-10-CM | POA: Diagnosis not present

## 2018-09-08 DIAGNOSIS — Z96651 Presence of right artificial knee joint: Secondary | ICD-10-CM | POA: Diagnosis not present

## 2018-09-08 DIAGNOSIS — M25669 Stiffness of unspecified knee, not elsewhere classified: Secondary | ICD-10-CM | POA: Diagnosis not present

## 2018-09-10 DIAGNOSIS — Z96651 Presence of right artificial knee joint: Secondary | ICD-10-CM | POA: Diagnosis not present

## 2018-09-10 DIAGNOSIS — R531 Weakness: Secondary | ICD-10-CM | POA: Diagnosis not present

## 2018-09-10 DIAGNOSIS — M21371 Foot drop, right foot: Secondary | ICD-10-CM | POA: Diagnosis not present

## 2018-09-10 DIAGNOSIS — M25669 Stiffness of unspecified knee, not elsewhere classified: Secondary | ICD-10-CM | POA: Diagnosis not present

## 2018-09-15 DIAGNOSIS — M21371 Foot drop, right foot: Secondary | ICD-10-CM | POA: Diagnosis not present

## 2018-09-15 DIAGNOSIS — R531 Weakness: Secondary | ICD-10-CM | POA: Diagnosis not present

## 2018-09-15 DIAGNOSIS — Z96651 Presence of right artificial knee joint: Secondary | ICD-10-CM | POA: Diagnosis not present

## 2018-09-15 DIAGNOSIS — M25669 Stiffness of unspecified knee, not elsewhere classified: Secondary | ICD-10-CM | POA: Diagnosis not present

## 2018-09-16 DIAGNOSIS — M21371 Foot drop, right foot: Secondary | ICD-10-CM | POA: Diagnosis not present

## 2018-09-16 DIAGNOSIS — M5441 Lumbago with sciatica, right side: Secondary | ICD-10-CM | POA: Diagnosis not present

## 2018-09-16 DIAGNOSIS — G8929 Other chronic pain: Secondary | ICD-10-CM | POA: Diagnosis not present

## 2018-09-16 DIAGNOSIS — G894 Chronic pain syndrome: Secondary | ICD-10-CM | POA: Diagnosis not present

## 2018-09-16 DIAGNOSIS — Z79899 Other long term (current) drug therapy: Secondary | ICD-10-CM | POA: Diagnosis not present

## 2018-09-17 DIAGNOSIS — R531 Weakness: Secondary | ICD-10-CM | POA: Diagnosis not present

## 2018-09-17 DIAGNOSIS — Z96651 Presence of right artificial knee joint: Secondary | ICD-10-CM | POA: Diagnosis not present

## 2018-09-17 DIAGNOSIS — M25669 Stiffness of unspecified knee, not elsewhere classified: Secondary | ICD-10-CM | POA: Diagnosis not present

## 2018-09-17 DIAGNOSIS — M21371 Foot drop, right foot: Secondary | ICD-10-CM | POA: Diagnosis not present

## 2018-09-18 ENCOUNTER — Ambulatory Visit (INDEPENDENT_AMBULATORY_CARE_PROVIDER_SITE_OTHER): Payer: Medicare Other

## 2018-09-18 DIAGNOSIS — Z23 Encounter for immunization: Secondary | ICD-10-CM | POA: Diagnosis not present

## 2018-09-18 NOTE — Progress Notes (Signed)
Patient came in today for flu shot today injection was tolerated well. MPulliam, CMA/RT(R)

## 2018-09-22 DIAGNOSIS — M25669 Stiffness of unspecified knee, not elsewhere classified: Secondary | ICD-10-CM | POA: Diagnosis not present

## 2018-09-22 DIAGNOSIS — M21371 Foot drop, right foot: Secondary | ICD-10-CM | POA: Diagnosis not present

## 2018-09-22 DIAGNOSIS — Z96651 Presence of right artificial knee joint: Secondary | ICD-10-CM | POA: Diagnosis not present

## 2018-09-22 DIAGNOSIS — R531 Weakness: Secondary | ICD-10-CM | POA: Diagnosis not present

## 2018-09-24 DIAGNOSIS — R531 Weakness: Secondary | ICD-10-CM | POA: Diagnosis not present

## 2018-09-24 DIAGNOSIS — Z96651 Presence of right artificial knee joint: Secondary | ICD-10-CM | POA: Diagnosis not present

## 2018-09-24 DIAGNOSIS — M25669 Stiffness of unspecified knee, not elsewhere classified: Secondary | ICD-10-CM | POA: Diagnosis not present

## 2018-09-24 DIAGNOSIS — M21371 Foot drop, right foot: Secondary | ICD-10-CM | POA: Diagnosis not present

## 2018-09-28 DIAGNOSIS — Z96651 Presence of right artificial knee joint: Secondary | ICD-10-CM | POA: Diagnosis not present

## 2018-09-28 DIAGNOSIS — M21371 Foot drop, right foot: Secondary | ICD-10-CM | POA: Diagnosis not present

## 2018-09-28 DIAGNOSIS — R531 Weakness: Secondary | ICD-10-CM | POA: Diagnosis not present

## 2018-09-28 DIAGNOSIS — M25669 Stiffness of unspecified knee, not elsewhere classified: Secondary | ICD-10-CM | POA: Diagnosis not present

## 2018-10-06 DIAGNOSIS — R531 Weakness: Secondary | ICD-10-CM | POA: Diagnosis not present

## 2018-10-06 DIAGNOSIS — M21371 Foot drop, right foot: Secondary | ICD-10-CM | POA: Diagnosis not present

## 2018-10-06 DIAGNOSIS — M25669 Stiffness of unspecified knee, not elsewhere classified: Secondary | ICD-10-CM | POA: Diagnosis not present

## 2018-10-06 DIAGNOSIS — Z96651 Presence of right artificial knee joint: Secondary | ICD-10-CM | POA: Diagnosis not present

## 2018-10-12 DIAGNOSIS — M25669 Stiffness of unspecified knee, not elsewhere classified: Secondary | ICD-10-CM | POA: Diagnosis not present

## 2018-10-12 DIAGNOSIS — Z96651 Presence of right artificial knee joint: Secondary | ICD-10-CM | POA: Diagnosis not present

## 2018-10-12 DIAGNOSIS — R531 Weakness: Secondary | ICD-10-CM | POA: Diagnosis not present

## 2018-10-12 DIAGNOSIS — M21371 Foot drop, right foot: Secondary | ICD-10-CM | POA: Diagnosis not present

## 2018-10-14 ENCOUNTER — Ambulatory Visit (INDEPENDENT_AMBULATORY_CARE_PROVIDER_SITE_OTHER): Payer: Medicare Other | Admitting: Family Medicine

## 2018-10-14 ENCOUNTER — Encounter: Payer: Self-pay | Admitting: Family Medicine

## 2018-10-14 VITALS — BP 136/82 | HR 80 | Temp 97.6°F | Ht 72.0 in | Wt 300.0 lb

## 2018-10-14 DIAGNOSIS — I25119 Atherosclerotic heart disease of native coronary artery with unspecified angina pectoris: Secondary | ICD-10-CM | POA: Diagnosis not present

## 2018-10-14 DIAGNOSIS — J329 Chronic sinusitis, unspecified: Secondary | ICD-10-CM | POA: Diagnosis not present

## 2018-10-14 DIAGNOSIS — H6691 Otitis media, unspecified, right ear: Secondary | ICD-10-CM

## 2018-10-14 DIAGNOSIS — H9191 Unspecified hearing loss, right ear: Secondary | ICD-10-CM

## 2018-10-14 DIAGNOSIS — H9201 Otalgia, right ear: Secondary | ICD-10-CM

## 2018-10-14 MED ORDER — AMOXICILLIN 500 MG PO CAPS: 1000.0000 mg | ORAL_CAPSULE | Freq: Three times a day (TID) | ORAL | 0 refills | Status: DC

## 2018-10-14 NOTE — Progress Notes (Signed)
Acute Care Office visit  Assessment and plan:  1. Right otitis media, unspecified otitis media type   2. Sinusitis, unspecified chronicity, unspecified location   3. Acute otalgia, right   4. Hearing loss of right ear, unspecified hearing loss type     1. Indication: Cerumen impaction of the RIGHT EAR Medical necessity statement:  On physical examination, cerumen impairs clinically significant portions of the R external auditory canal, and tympanic membrane.  Noted obstructive, copious cerumen that cannot be removed without magnification and instrumentations requiring physician skills Consent:  Discussed benefits and risks of procedure and verbal consent obtained Procedure:   Patient was prepped for the procedure.  Utilized an otoscope to assess and take note of the R ear canal, the tympanic membrane, and the presence, amount, and placement of the cerumen.  Soft plastic curette was utilized by physician to remove cerumen.  Gentle water irrigation was performed by CMA. Post procedure examination: Shows cerumen was removed, without trauma or injury to the ear canal or TM, which remains intact.   Post-Procedural Ear Care Instructions:    Patient tolerated procedure well.  Proper ear care d/c pt.   The patient is made aware that they may experience temporary vertigo, temporary hearing loss, and temporary discomfort.  If these symptom last for more than 24 hours to call the clinic or proceed to the ED/Urgent Care.  2. R Otitis Media  - Antibiotics prescribed today.  See med list below.  - Viral vs Allergic vs Bacterial causes for pt's symptoms reveiwed.     - Supportive care and various OTC medications discussed in addition to any prescribed.  Tylenol recommended for pain.  - Do NOT place anything into the ear.  Discussed prudent habits with patient at length today.  - DISCONTINUE USE OF Q-TIPS OR OTHER OBJECTS IN EAR.   - If pt desires to clean his ears, advised use of a mixture of  50/50 hydrogen peroxide and rubbing alcohol.  - Call or RTC if new symptoms, or if no improvement or worse over next several days.      Meds ordered this encounter  Medications  . amoxicillin (AMOXIL) 500 MG capsule    Sig: Take 2 capsules (1,000 mg total) by mouth 3 (three) times daily.    Dispense:  60 capsule    Refill:  0    Gross side effects, risk and benefits, and alternatives of medications discussed with patient.  Patient is aware that all medications have potential side effects and we are unable to predict every sideeffect or drug-drug interaction that may occur.  Expresses verbal understanding and consents to current therapy plan and treatment regiment.   Education and routine counseling performed. Handouts provided.  Anticipatory guidance and routine counseling done re: condition, txmnt options and need for follow up. All questions of patient's were answered.  Return if symptoms worsen or fail to improve, for Keep chronic follow-up office visits.  Please see AVS handed out to patient at the end of our visit for additional patient instructions/ counseling done pertaining to today's office visit.  Note:  This document was partially repared using Dragon voice recognition software and may include unintentional dictation errors.  This document serves as a record of services personally performed by Mellody Dance, DO. It was created on her behalf by Toni Amend, a trained medical scribe. The creation of this record is based on the scribe's personal observations and the provider's statements to them.   I have reviewed the  above medical documentation for accuracy and completeness and I concur.  Mellody Dance, DO     Subjective:    Chief Complaint  Patient presents with  . Ear Pain    Patient states that he often uses a car key to clean out his ear.  HPI:  Pt presents with Sx for 2 days, starting yesterday.  Thinks he has an ear infection or sinus  infection.   C/o:  Notes that his R ear started hurting badly.  States "if he pulled on his earlobe, the ear hurt worse."  Additionally, his R ear felt "plugged up," and he felt pressure in the right-side back of his head.  Denies:  Denies fevers.  Has not measured it.  Feels he does have one-sided face pain, on the R side.    For symptoms patient has tried:  Patient states he "put peroxide in [his right ear] with cotton" to treat.  Notes he did this again when he went to bed.  Overall getting:  Feels that he might have a right ear infection or a sinus infection.  His wife has also recently been getting over an ear infection/sinus infection.   Patient Care Team    Relationship Specialty Notifications Start End  Mellody Dance, DO PCP - General Family Medicine  10/08/16   Lucia Bitter., MD Physician Assistant Pain Medicine  11/13/17   Richardo Priest, MD Consulting Physician Cardiology  11/13/17     Past medical history, Surgical history, Family history reviewed and noted below, Social history, Allergies, and Medications have been entered into the medical record, reviewed and changed as needed.   No Known Allergies  Review of Systems: - see above HPI for pertinent positives General:   No F/C, wt loss Pulm:   No DIB, pleuritic chest pain Card:  No CP, palpitations Abd:  No n/v/d or pain Ext:  No inc edema from baseline   Objective:   Blood pressure 136/82, pulse 80, temperature 97.6 F (36.4 C), height 6' (1.829 m), weight 300 lb (136.1 kg), SpO2 97 %. Body mass index is 40.69 kg/m. General: Well Developed, well nourished, appropriate for stated age.  Neuro: Alert and oriented x3, extra-ocular muscles intact, sensation grossly intact.  HEENT: Normocephalic, atraumatic, pupils equal round reactive to light, neck supple, no masses, no painful lymphadenopathy, TM's intact B/L, R EAC is not edematous or erythematous, but it is blocked with cerumen.  After flushing, R TM was very  erythematous and bulging in multiple areas.  Surface of R TM was irregular, erythematous, with multiple areas of bulging.  Nares- patent, clear d/c, OP- clear, mild erythema, No TTP sinuses Skin: Warm and dry, no gross rash. Cardiac: RRR, S1 S2,  no murmurs rubs or gallops.  Respiratory: ECTA B/L and A/P, Not using accessory muscles, speaking in full sentences- unlabored. Vascular:  No gross lower ext edema, cap RF less 2 sec. Psych: No HI/SI, judgement and insight good, Euthymic mood. Full Affect.

## 2018-10-14 NOTE — Patient Instructions (Signed)

## 2018-10-16 ENCOUNTER — Ambulatory Visit: Payer: Medicare Other | Admitting: Family Medicine

## 2018-10-21 ENCOUNTER — Encounter: Payer: Self-pay | Admitting: Family Medicine

## 2018-10-21 ENCOUNTER — Ambulatory Visit (INDEPENDENT_AMBULATORY_CARE_PROVIDER_SITE_OTHER): Payer: Medicare Other | Admitting: Family Medicine

## 2018-10-21 VITALS — BP 140/78 | HR 78 | Ht 72.0 in | Wt 300.0 lb

## 2018-10-21 DIAGNOSIS — I482 Chronic atrial fibrillation, unspecified: Secondary | ICD-10-CM | POA: Diagnosis not present

## 2018-10-21 DIAGNOSIS — Z7901 Long term (current) use of anticoagulants: Secondary | ICD-10-CM

## 2018-10-21 DIAGNOSIS — G4733 Obstructive sleep apnea (adult) (pediatric): Secondary | ICD-10-CM | POA: Diagnosis not present

## 2018-10-21 DIAGNOSIS — R5383 Other fatigue: Secondary | ICD-10-CM | POA: Diagnosis not present

## 2018-10-21 DIAGNOSIS — E559 Vitamin D deficiency, unspecified: Secondary | ICD-10-CM

## 2018-10-21 DIAGNOSIS — L84 Corns and callosities: Secondary | ICD-10-CM

## 2018-10-21 DIAGNOSIS — I10 Essential (primary) hypertension: Secondary | ICD-10-CM

## 2018-10-21 DIAGNOSIS — I272 Pulmonary hypertension, unspecified: Secondary | ICD-10-CM | POA: Diagnosis not present

## 2018-10-21 DIAGNOSIS — R809 Proteinuria, unspecified: Secondary | ICD-10-CM

## 2018-10-21 DIAGNOSIS — M159 Polyosteoarthritis, unspecified: Secondary | ICD-10-CM | POA: Diagnosis not present

## 2018-10-21 DIAGNOSIS — I5042 Chronic combined systolic (congestive) and diastolic (congestive) heart failure: Secondary | ICD-10-CM | POA: Diagnosis not present

## 2018-10-21 DIAGNOSIS — M21371 Foot drop, right foot: Secondary | ICD-10-CM

## 2018-10-21 DIAGNOSIS — I25119 Atherosclerotic heart disease of native coronary artery with unspecified angina pectoris: Secondary | ICD-10-CM

## 2018-10-21 NOTE — Progress Notes (Signed)
Impression and Recommendations:    1. Obesity, Class III, BMI 40-49.9 (morbid obesity) ([place])   2. Chronic atrial fibrillation   3. Chronic combined systolic and diastolic heart failure ([place])   4. Pulmonary hypertension ([place])   5. Hypertension, unspecified type   6. OSA (obstructive sleep apnea)   7. Right foot drop   8. Callus of foot   9. Positive for microalbuminuria   10. Other fatigue   11. Vitamin D deficiency   12. Generalized OA   13. Chronic anticoagulation     - Per patient, notes need for amoxicillin in near future along with dental work.  1. Right Foot Drop, Callus of foot - Referral to podiatrist placed today.  Patient describes concerns with foot drop on right foot and callus formation that is causing him concern.  - Will continue to monitor.  2. Chronic atrial fibrillation - Stable at this time.  - Will continue to monitor.  3. Obstructive Sleep Apnea - Stable at this time. - Continue use of CPAP as prescribed. - Will continue to monitor.  4. Hypertension - Blood pressure elevated on intake today. - Continue treatment plan as prescribed.  See med list below. - Patient tolerating meds well without complication.  Denies S-E  - Lifestyle changes such as dash diet and engaging in a regular exercise program discussed with patient.  Educational handouts provided  - Encouraged patient to resume ambulatory BP monitoring.  Keep log and bring in next OV.  - Continue current medication(s).   Also, risks and benefits of medications discussed with patient, including alternative treatments.   Encouraged patient to read drug information handouts to further educate self about the medicine prior to starting it.   - Contact us prior with any Q's/ concerns.  5. BMI Counseling - BMI of 40.69, Morbid Obesity Explained to patient what BMI refers to, and what it means medically.    Told patient to think about it as a "medical risk stratification measurement" and how  increasing BMI is associated with increasing risk/ or worsening state of various diseases such as hypertension, hyperlipidemia, diabetes, premature OA, depression etc.  American Heart Association guidelines for healthy diet, basically Mediterranean diet, and exercise guidelines of 30 minutes 5 days per week or more discussed in detail.  Health counseling performed.  All questions answered.  - Encouraged patient to download an app for assistance with tracking physical activity.  6. Lifestyle & Preventative Health Maintenance - Advised patient to continue working toward exercising to improve overall mental, physical, and emotional health.    - Reviewed the "spokes of the wheel" of mood and health management.  Stressed the importance of ongoing prudent habits, including regular exercise, appropriate sleep hygiene, healthful dietary habits, and prayer/meditation to relax.  - Encouraged patient to engage in daily physical activity, especially a formal exercise routine.  Recommended that the patient eventually strive for at least 150 minutes of moderate cardiovascular activity per week according to guidelines established by the [facility].   - Healthy dietary habits encouraged, including low-carb, and high amounts of lean protein in diet.   - Patient should also consume adequate amounts of water.   Education and routine counseling performed. Handouts provided.    Orders Placed This Encounter  Procedures  . CBC with Differential/Platelet  . Comprehensive metabolic panel  . Hemoglobin A1c  . Lipid panel  . T4, free  . TSH  . VITAMIN D 25 Hydroxy (Vit-D Deficiency, Fractures)  . B12  .  Ambulatory referral to Podiatry    The patient was counseled, risk factors were discussed, anticipatory guidance given.  Gross side effects, risk and benefits, and alternatives of medications discussed with patient.  Patient is aware that all medications have potential side effects and we are unable to predict  every side effect or drug-drug interaction that may occur.  Expresses verbal understanding and consents to current therapy plan and treatment regimen.  Return for FBW near future; 32mo chronic care.  Please see AVS handed out to patient at the end of our visit for further patient instructions/ counseling done pertaining to today's office visit.    Note:  This document was prepared using Dragon voice recognition software and may include unintentional dictation errors.  This document serves as a record of services personally performed by Mellody Dance, DO. It was created on her behalf by Toni Amend, a trained medical scribe. The creation of this record is based on the scribe's personal observations and the provider's statements to them.   I have reviewed the above medical documentation for accuracy and completeness and I concur.  Mellody Dance, DO 10/22/2018 11:39 AM        Subjective:    HPI: Joel Dominguez is a 67 y.o. male who presents to [place] [facility] today for follow up of [place].    He has been more active during the holiday seasons playing as [place].  Has noticed a difference in how he feels with the little bit of weight he's put back on again.    Patient plans to get back onto his exercise routine and diet come January.  Notes he has upcoming back work.  Right Foot Drop Patient is worried about his right foot drop, and a "large callus" that has formed as a result of his foot drop.  Broken Tooth Patient recently had a broken tooth (it broke about a month and a half, two months).  Patient notes he will be needing dental attention in the near future.  He takes 4 of the 500 amoxicillin just an hour prior to dental surgery.  Recent Acute Ear Infection States his ear is feeling better.  He continues to take his meds.  Obstructive Sleep Apnea & CPAP Use Continues wearing his machine nightly.  Denies concerns with his CPAP  machine. States "I can't go to sleep without it."  HTN:  -  His blood pressure has been controlled at home.  Pt has not been checking it regularly.  States he's been checking it off and on.  - Patient reports good compliance with blood pressure medications.  - Denies medication S-E.   - Smoking Status noted   - He denies new onset of: chest pain, exercise intolerance, shortness of breath, dizziness, visual changes, headache, lower extremity swelling or claudication.   Denies chronic afib symptoms.  "Occasionally, but nothing I can remember."  No chest pain or SOB.  Notes all his joints are sore, but this is nothing new from the norm.  Last 3 blood pressure readings in our office are as follows: BP Readings from Last 3 Encounters:  10/21/18 140/78  10/14/18 136/82  08/28/18 132/74    Pulse Readings from Last 3 Encounters:  10/21/18 78  10/14/18 80  08/28/18 79    Filed Weights   10/21/18 1142  Weight: 300 lb (136.1 kg)      Patient Care Team    Relationship Specialty Notifications Start End  Mellody Dance, DO PCP - General  Family Medicine  10/08/16   Lucia Bitter., MD Physician Assistant Pain Medicine  11/13/17   Richardo Priest, MD Consulting Physician Cardiology  11/13/17      Lab Results  Component Value Date   CREATININE 1.27 08/28/2018   BUN 24 08/28/2018   NA 142 08/28/2018   K 4.4 08/28/2018   CL 101 08/28/2018   CO2 25 08/28/2018    Lab Results  Component Value Date   CHOL 161 10/22/2017   CHOL 150 07/08/2017   CHOL 134 03/11/2017    Lab Results  Component Value Date   HDL 43 10/22/2017   HDL 36 07/08/2017   HDL 34 (A) 03/11/2017    Lab Results  Component Value Date   LDLCALC 92 10/22/2017   LDLCALC 84 07/08/2017   LDLCALC 73 03/11/2017    Lab Results  Component Value Date   TRIG 129 10/22/2017   TRIG 150 07/08/2017   TRIG 136 03/11/2017    Lab Results  Component Value Date   CHOLHDL 3.7 10/22/2017   CHOLHDL 4.3  10/08/2016    No results found for: LDLDIRECT ===================================================================   Patient Active Problem List   Diagnosis Date Noted  . OSA (obstructive sleep apnea) 10/08/2016    Priority: High  . Pulmonary hypertension ([place]) 10/08/2016    Priority: High  . Obesity, Class III, BMI 40-49.9 (morbid obesity) ([place]) 10/08/2016    Priority: High  . h/o Stroke 10/08/2016    Priority: Medium  . Chronic combined systolic and diastolic heart failure ([place]) 10/08/2016    Priority: Medium  . Chronic atrial fibrillation 10/08/2016    Priority: Medium  . Chronic anticoagulation 10/08/2016    Priority: Medium  . Generalized OA 10/08/2016    Priority: Low  . Foot drop- R 10/08/2016    Priority: Low  . Lumbar spinal stenosis 10/08/2016    Priority: Low  . Chronic bilateral low back pain with right-sided sciatica 03/03/2018  . Lumbosacral radiculopathy 01/22/2018  . Osteoarthritis 11/19/2017  . Vitamin D deficiency 11/19/2017  . Positive for microalbuminuria 11/19/2017  . Hypertensive heart disease with heart failure ([place]) 11/10/2017  . 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 fascicular block) 11/07/2017  . Aortic stenosis 11/06/2017  . Hypertension   . H/O noncompliance with medical treatment, presenting hazards to health 10/22/2017     Past Medical History:  Diagnosis Date  . A-fib- couple yrs now. 10/08/2016  . Aortic stenosis 11/06/2017  . Atherosclerotic heart disease of native coronary artery with unspecified angina pectoris ([place]) 10/08/2016  . CHF (congestive heart failure) ([place])   . Chronic anticoagulation 10/08/2016  . Foot drop- R 10/08/2016   Patient known foot drop for many years due to lumbar spinal stenosis.-Over the past couple of months symptoms have gotten worse.  He feels he is tripping more and cannot lift his foot up as much. Toes are curling and calluses on end of toes.    - Did see spinal surgeon many yrs ago.  Declined sx  at that time.     . Generalized OA 10/08/2016   Ortho doc-  Dr. [facility] in [place]- both knees replaced 08, 09.     . H/O noncompliance with medical treatment, presenting hazards to health 10/22/2017  . Heart disease   . Hypertension   . Obesity, Class III, BMI 40-49.9 (morbid obesity) ([place]) 10/08/2016  . OSA (obstructive sleep apnea) 10/08/2016   Bi-PAP nitely- been 20+ yrs now  . Pulmonary hypertension ([place]) 10/08/2016  .  Spinal stenosis, lumbar region, with neurogenic claudication 10/08/2016   Back specialist in [place]-    only surgeon he would use- pt declined referral here for back pain mgt    . Stroke [facility])      Past Surgical History:  Procedure Laterality Date  . REPLACEMENT TOTAL KNEE Bilateral   . TIBIA FRACTURE SURGERY       Family History  Problem Relation Age of Onset  . Congestive Heart Failure Mother   . Hypertension Mother   . Diabetes Mother   . Cancer Father        lung  . Congestive Heart Failure Sister   . Hypertension Sister   . Hypertension Brother   . Hypertension Sister   . Diabetes Brother      Social History   Substance and Sexual Activity  Drug Use No  ,  Social History   Substance and Sexual Activity  Alcohol Use Yes   Comment: once monthly  ,  Social History   Tobacco Use  Smoking Status Never Smoker  Smokeless Tobacco Never Used  ,    Current Outpatient Medications on File Prior to Visit  Medication Sig Dispense Refill  . amoxicillin (AMOXIL) 500 MG capsule Take 2 capsules (1,000 mg total) by mouth 3 (three) times daily. 60 capsule 0  . apixaban (ELIQUIS) 5 MG TABS tablet Take 1 tablet (5 mg total) by mouth 2 (two) times daily. 180 tablet 3  . bumetanide (BUMEX) 2 MG tablet Take 1 tablet (2 mg total) by mouth 2 (two) times daily. 180 tablet 3  . carvedilol (COREG) 25 MG tablet Take 1 tablet (25 mg total) by mouth 2 (two) times daily with a meal. 180 tablet 3  . HYDROcodone-acetaminophen (NORCO) 10-325 MG tablet Take 1 tablet by  mouth as needed.    . Multiple Vitamin (MULTIVITAMIN) tablet Take 1 tablet by mouth daily.    . potassium chloride (K-DUR,KLOR-CON) 10 MEQ tablet Take 2 tablets (20 mEq total) by mouth 2 (two) times daily. 180 tablet 3  . spironolactone (ALDACTONE) 25 MG tablet TAKE 1 TABLET BY MOUTH DAILY. 90 tablet 0  . telmisartan (MICARDIS) 80 MG tablet Take 1 tablet (80 mg total) by mouth daily. 90 tablet 3  . Vitamin D, Ergocalciferol, (DRISDOL) 50000 units CAPS capsule Take 1 capsule (50,000 Units total) by mouth every 7 (seven) days. 12 capsule 10   No current facility-administered medications on file prior to visit.      No Known Allergies   Review of Systems:   General:  Denies fever, chills Optho/Auditory:   Denies visual changes, blurred vision Respiratory:   Denies SOB, cough, wheeze, DIB  Cardiovascular:   Denies chest pain, palpitations, painful respirations Gastrointestinal:   Denies nausea, vomiting, diarrhea.  Endocrine:     Denies new hot or cold intolerance Musculoskeletal:  Denies joint swelling, gait issues, or new unexplained myalgias/ arthralgias Skin:  Denies rash, suspicious lesions  Neurological:    Denies dizziness, unexplained weakness, numbness  Psychiatric/Behavioral:   Denies mood changes  Objective:    Blood pressure 140/78, pulse 78, height 6' (1.829 m), weight 300 lb (136.1 kg), SpO2 98 %.  Body mass index is 40.69 kg/m.  General: Well Developed, well nourished, and in no acute distress.  HEENT: Normocephalic, atraumatic, pupils equal round reactive to light, neck supple, No carotid bruits, no JVD Skin: Warm and dry, cap RF less 2 sec Cardiac: Regular rate and rhythm, S1, S2 WNL's, no murmurs rubs or gallops Respiratory:  ECTA B/L, Not using accessory muscles, speaking in full sentences. NeuroM-Sk: Ambulates w/o assistance, moves ext * 4 w/o difficulty, sensation grossly intact.  Ext: scant edema b/l lower ext Psych: No HI/SI, judgement and insight good,  Euthymic mood. Full Affect.

## 2018-10-21 NOTE — Patient Instructions (Signed)
Joel Dominguez please place orders for full set of fasting labs in the near future including B12 due to age.  Your goal blood pressure should be 135/85 or less on a regular basis, or medications should be started/ modified.        Hypertension Hypertension, commonly called high blood pressure, is when the force of blood pumping through the arteries is too strong. The arteries are the blood vessels that carry blood from the heart throughout the body. Hypertension forces the heart to work harder to pump blood and may cause arteries to become narrow or stiff. Having untreated or uncontrolled hypertension can cause heart attacks, strokes, kidney disease, and other problems. A blood pressure reading consists of a higher number over a lower number. Ideally, your blood pressure should be below 120/80. The first ("top") number is called the systolic pressure. It is a measure of the pressure in your arteries as your heart beats. The second ("bottom") number is called the diastolic pressure. It is a measure of the pressure in your arteries as the heart relaxes. What are the causes? The cause of this condition is not known. What increases the risk? Some risk factors for high blood pressure are under your control. Others are not. Factors you can change  Smoking.  Having type 2 diabetes mellitus, high cholesterol, or both.  Not getting enough exercise or physical activity.  Being overweight.  Having too much fat, sugar, calories, or salt (sodium) in your diet.  Drinking too much alcohol. Factors that are difficult or impossible to change  Having chronic kidney disease.  Having a family history of high blood pressure.  Age. Risk increases with age.  Race. You may be at higher risk if you are African-American.  Gender. Men are at higher risk than women before age 29. After age 78, women are at higher risk than men.  Having obstructive sleep apnea.  Stress. What are the signs or  symptoms? Extremely high blood pressure (hypertensive crisis) may cause:  Headache.  Anxiety.  Shortness of breath.  Nosebleed.  Nausea and vomiting.  Severe chest pain.  Jerky movements you cannot control (seizures).  How is this diagnosed? This condition is diagnosed by measuring your blood pressure while you are seated, with your arm resting on a surface. The cuff of the blood pressure monitor will be placed directly against the skin of your upper arm at the level of your heart. It should be measured at least twice using the same arm. Certain conditions can cause a difference in blood pressure between your right and left arms. Certain factors can cause blood pressure readings to be lower or higher than normal (elevated) for a short period of time:  When your blood pressure is higher when you are in a health care provider's office than when you are at home, this is called white coat hypertension. Most people with this condition do not need medicines.  When your blood pressure is higher at home than when you are in a health care provider's office, this is called masked hypertension. Most people with this condition may need medicines to control blood pressure.  If you have a high blood pressure reading during one visit or you have normal blood pressure with other risk factors:  You may be asked to return on a different day to have your blood pressure checked again.  You may be asked to monitor your blood pressure at home for 1 week or longer.  If you are diagnosed with hypertension, you  may have other blood or imaging tests to help your health care provider understand your overall risk for other conditions. How is this treated? This condition is treated by making healthy lifestyle changes, such as eating healthy foods, exercising more, and reducing your alcohol intake. Your health care provider may prescribe medicine if lifestyle changes are not enough to get your blood pressure  under control, and if:  Your systolic blood pressure is above 130.  Your diastolic blood pressure is above 80.  Your personal target blood pressure may vary depending on your medical conditions, your age, and other factors. Follow these instructions at home: Eating and drinking  Eat a diet that is high in fiber and potassium, and low in sodium, added sugar, and fat. An example eating plan is called the DASH (Dietary Approaches to Stop Hypertension) diet. To eat this way: ? Eat plenty of fresh fruits and vegetables. Try to fill half of your plate at each meal with fruits and vegetables. ? Eat whole grains, such as whole wheat pasta, brown rice, or whole grain bread. Fill about one quarter of your plate with whole grains. ? Eat or drink low-fat dairy products, such as skim milk or low-fat yogurt. ? Avoid fatty cuts of meat, processed or cured meats, and poultry with skin. Fill about one quarter of your plate with lean proteins, such as fish, chicken without skin, beans, eggs, and tofu. ? Avoid premade and processed foods. These tend to be higher in sodium, added sugar, and fat.  Reduce your daily sodium intake. Most people with hypertension should eat less than 1,500 mg of sodium a day.  Limit alcohol intake to no more than 1 drink a day for nonpregnant women and 2 drinks a day for men. One drink equals 12 oz of beer, 5 oz of wine, or 1 oz of hard liquor. Lifestyle  Work with your health care provider to maintain a healthy body weight or to lose weight. Ask what an ideal weight is for you.  Get at least 30 minutes of exercise that causes your heart to beat faster (aerobic exercise) most days of the week. Activities may include walking, swimming, or biking.  Include exercise to strengthen your muscles (resistance exercise), such as pilates or lifting weights, as part of your weekly exercise routine. Try to do these types of exercises for 30 minutes at least 3 days a week.  Do not use any  products that contain nicotine or tobacco, such as cigarettes and e-cigarettes. If you need help quitting, ask your health care provider.  Monitor your blood pressure at home as told by your health care provider.  Keep all follow-up visits as told by your health care provider. This is important. Medicines  Take over-the-counter and prescription medicines only as told by your health care provider. Follow directions carefully. Blood pressure medicines must be taken as prescribed.  Do not skip doses of blood pressure medicine. Doing this puts you at risk for problems and can make the medicine less effective.  Ask your health care provider about side effects or reactions to medicines that you should watch for. Contact a health care provider if:  You think you are having a reaction to a medicine you are taking.  You have headaches that keep coming back (recurring).  You feel dizzy.  You have swelling in your ankles.  You have trouble with your vision. Get help right away if:  You develop a severe headache or confusion.  You have unusual weakness  or numbness.  You feel faint.  You have severe pain in your chest or abdomen.  You vomit repeatedly.  You have trouble breathing. Summary  Hypertension is when the force of blood pumping through your arteries is too strong. If this condition is not controlled, it may put you at risk for serious complications.  Your personal target blood pressure may vary depending on your medical conditions, your age, and other factors. For most people, a normal blood pressure is less than 120/80.  Hypertension is treated with lifestyle changes, medicines, or a combination of both. Lifestyle changes include weight loss, eating a healthy, low-sodium diet, exercising more, and limiting alcohol. This information is not intended to replace advice given to you by your health care provider. Make sure you discuss any questions you have with your health care  provider. Document Released: 10/21/2005 Document Revised: 09/18/2016 Document Reviewed: 09/18/2016 Elsevier Interactive Patient Education  2018 [employer].    How to Take Your Blood Pressure   Blood pressure is a measurement of how strongly your blood is pressing against the walls of your arteries. Arteries are blood vessels that carry blood from your heart throughout your body. Your health care provider takes your blood pressure at each office visit. You can also take your own blood pressure at home with a blood pressure machine. You may need to take your own blood pressure:  To confirm a diagnosis of high blood pressure (hypertension).  To monitor your blood pressure over time.  To make sure your blood pressure medicine is working.  Supplies needed: To take your blood pressure, you will need a blood pressure machine. You can buy a blood pressure machine, or blood pressure monitor, at most drugstores or online. There are several types of home blood pressure monitors. When choosing one, consider the following:  Choose a monitor that has an arm cuff.  Choose a monitor that wraps snugly around your upper arm. You should be able to fit only one finger between your arm and the cuff.  Do not choose a monitor that measures your blood pressure from your wrist or finger.  Your health care provider can suggest a reliable monitor that will meet your needs. How to prepare To get the most accurate reading, avoid the following for 30 minutes before you check your blood pressure:  Drinking caffeine.  Drinking alcohol.  Eating.  Smoking.  Exercising.  Five minutes before you check your blood pressure:  Empty your bladder.  Sit quietly without talking in a dining chair, rather than in a soft couch or armchair.  How to take your blood pressure To check your blood pressure, follow the instructions in the manual that came with your blood pressure monitor. If you have a digital blood  pressure monitor, the instructions may be as follows: 1. Sit up straight. 2. Place your feet on the floor. Do not cross your ankles or legs. 3. Rest your left arm at the level of your heart on a table or desk or on the arm of a chair. 4. Pull up your shirt sleeve. 5. Wrap the blood pressure cuff around the upper part of your left arm, 1 inch (2.5 cm) above your elbow. It is best to wrap the cuff around bare skin. 6. Fit the cuff snugly around your arm. You should be able to place only one finger between the cuff and your arm. 7. Position the cord inside the groove of your elbow. 8. Press the power button. 9. Sit quietly  while the cuff inflates and deflates. 10. Read the digital reading on the monitor screen and write it down (record it). 11. Wait 2-3 minutes, then repeat the steps, starting at step 1.  What does my blood pressure reading mean? A blood pressure reading consists of a higher number over a lower number. Ideally, your blood pressure should be below 120/80. The first ("top") number is called the systolic pressure. It is a measure of the pressure in your arteries as your heart beats. The second ("bottom") number is called the diastolic pressure. It is a measure of the pressure in your arteries as the heart relaxes. Blood pressure is classified into four stages. The following are the stages for adults who do not have a short-term serious illness or a chronic condition. Systolic pressure and diastolic pressure are measured in a unit called mm Hg. Normal  Systolic pressure: below 226.  Diastolic pressure: below 80. Elevated  Systolic pressure: 333-545.  Diastolic pressure: below 80. Hypertension stage 1  Systolic pressure: 625-638.  Diastolic pressure: 93-73. Hypertension stage 2  Systolic pressure: 428 or above.  Diastolic pressure: 90 or above. You can have prehypertension or hypertension even if only the systolic or only the diastolic number in your reading is higher  than normal. Follow these instructions at home:  Check your blood pressure as often as recommended by your health care provider.  Take your monitor to the next appointment with your health care provider to make sure: ? That you are using it correctly. ? That it provides accurate readings.  Be sure you understand what your goal blood pressure numbers are.  Tell your health care provider if you are having any side effects from blood pressure medicine. Contact a health care provider if:  Your blood pressure is consistently high. Get help right away if:  Your systolic blood pressure is higher than 180.  Your diastolic blood pressure is higher than 110. This information is not intended to replace advice given to you by your health care provider. Make sure you discuss any questions you have with your health care provider. Document Released: 03/29/2016 Document Revised: 06/11/2016 Document Reviewed: 03/29/2016 Elsevier Interactive Patient Education  [employer].

## 2018-11-06 ENCOUNTER — Other Ambulatory Visit: Payer: Self-pay | Admitting: Family Medicine

## 2018-11-06 DIAGNOSIS — I10 Essential (primary) hypertension: Secondary | ICD-10-CM

## 2018-11-11 ENCOUNTER — Other Ambulatory Visit: Payer: Self-pay | Admitting: Podiatry

## 2018-11-11 ENCOUNTER — Ambulatory Visit (INDEPENDENT_AMBULATORY_CARE_PROVIDER_SITE_OTHER): Payer: Medicare Other

## 2018-11-11 ENCOUNTER — Ambulatory Visit (INDEPENDENT_AMBULATORY_CARE_PROVIDER_SITE_OTHER): Payer: Medicare Other | Admitting: Podiatry

## 2018-11-11 ENCOUNTER — Encounter: Payer: Self-pay | Admitting: Podiatry

## 2018-11-11 VITALS — BP 134/50 | HR 83

## 2018-11-11 DIAGNOSIS — L97512 Non-pressure chronic ulcer of other part of right foot with fat layer exposed: Secondary | ICD-10-CM

## 2018-11-11 DIAGNOSIS — I83015 Varicose veins of right lower extremity with ulcer other part of foot: Secondary | ICD-10-CM | POA: Diagnosis not present

## 2018-11-11 DIAGNOSIS — M79674 Pain in right toe(s): Secondary | ICD-10-CM

## 2018-11-11 DIAGNOSIS — L97519 Non-pressure chronic ulcer of other part of right foot with unspecified severity: Secondary | ICD-10-CM | POA: Diagnosis not present

## 2018-11-11 MED ORDER — SULFAMETHOXAZOLE-TRIMETHOPRIM 800-160 MG PO TABS: 1.0000 | ORAL_TABLET | Freq: Two times a day (BID) | ORAL | 0 refills | Status: AC

## 2018-11-14 LAB — WOUND CULTURE
GRAM STAIN:: NONE SEEN
MICRO NUMBER:: 28212
SPECIMEN QUALITY:: ADEQUATE

## 2018-11-15 NOTE — Progress Notes (Signed)
Subjective:  68 year old male presenting today as a new patient with a chief complaint of a painful callus noted to the right 2nd toe that appeared about two weeks ago. He states he hit the toe on a piece of metal and is now experiencing redness and swelling. He has been soaking the toe for treatment and states that is when the callus developed. There are no modifying factors noted. He is currently on anticoagulant therapy. Patient is here for further evaluation and treatment.   Past Medical History:  Diagnosis Date  . A-fib- couple yrs now. 10/08/2016  . Aortic stenosis 11/06/2017  . Atherosclerotic heart disease of native coronary artery with unspecified angina pectoris ([place]) 10/08/2016  . CHF (congestive heart failure) ([place])   . Chronic anticoagulation 10/08/2016  . Foot drop- R 10/08/2016   Patient known foot drop for many years due to lumbar spinal stenosis.-Over the past couple of months symptoms have gotten worse.  He feels he is tripping more and cannot lift his foot up as much. Toes are curling and calluses on end of toes.    - Did see spinal surgeon many yrs ago.  Declined sx at that time.     . Generalized OA 10/08/2016   Ortho doc-  Dr. [facility] in [place]- both knees replaced 08, 09.     . H/O noncompliance with medical treatment, presenting hazards to health 10/22/2017  . Heart disease   . Hypertension   . Obesity, Class III, BMI 40-49.9 (morbid obesity) ([place]) 10/08/2016  . OSA (obstructive sleep apnea) 10/08/2016   Bi-PAP nitely- been 20+ yrs now  . Pulmonary hypertension ([place]) 10/08/2016  . Spinal stenosis, lumbar region, with neurogenic claudication 10/08/2016   Back specialist in [place]-    only surgeon he would use- pt declined referral here for back pain mgt    . Stroke [facility])      Objective/Physical Exam General: The patient is alert and oriented x3 in no acute distress.  Dermatology:  Wound #1 noted to the right 2nd toe measuring 1.0 x 1.0 x 0.1 cm (LxWxD).   To the noted  ulceration(s), there is no eschar. There is a moderate amount of slough, fibrin, and necrotic tissue noted. Granulation tissue and wound base is red. There is a minimal amount of serosanguineous drainage noted. There is no exposed bone muscle-tendon ligament or joint. There is no malodor. Periwound integrity is intact. Redness and swelling localized to the right 2nd toe. Skin is warm, dry and supple bilateral lower extremities.  Vascular: Palpable pedal pulses bilaterally. Mild edema noted. Capillary refill within normal limits. Varicosities noted bilateral lower extremities.   Neurological: Epicritic and protective threshold diminished bilaterally.   Musculoskeletal Exam: Loss of dorsiflexion/eversion of the right foot. Range of motion within normal limits to all other pedal and ankle joints bilateral. Muscle strength 5/5 in all other groups bilateral.   Radiographic Exam:  Normal osseous mineralization. Joint spaces preserved. No fracture/dislocation/boney destruction.    Assessment: #1 ulceration of the right 2nd toe secondary to venous insufficiency #2 varicosities bilateral lower extremities #3 Cellulitis right 2nd toe #4 Foot drop RLE  Plan of Care:  #1 Patient was evaluated. #2 medically necessary excisional debridement including subcutaneous tissue was performed using a tissue nipper and a chisel blade. Excisional debridement of all the necrotic nonviable tissue down to healthy bleeding viable tissue was performed with post-debridement measurements same as pre-. #3 the wound was cleansed with normal saline. #4 recommended Betadine ointment daily with a  bandage.  #5 Continue using AFO brace.  #6 Culture taken from wound.  #7 Prescription for Bactrim DS #20 provided to patient.  #8 Return to clinic in 2 weeks.    Edrick Kins, DPM [facility]  Dr. Edrick Kins, [address]                                        [place], [place] [address]                 Office [phone]  Fax [phone]

## 2018-11-16 DIAGNOSIS — M5441 Lumbago with sciatica, right side: Secondary | ICD-10-CM | POA: Diagnosis not present

## 2018-11-16 DIAGNOSIS — Z79899 Other long term (current) drug therapy: Secondary | ICD-10-CM | POA: Diagnosis not present

## 2018-11-16 DIAGNOSIS — G8929 Other chronic pain: Secondary | ICD-10-CM | POA: Diagnosis not present

## 2018-11-16 DIAGNOSIS — M21371 Foot drop, right foot: Secondary | ICD-10-CM | POA: Diagnosis not present

## 2018-11-16 DIAGNOSIS — G894 Chronic pain syndrome: Secondary | ICD-10-CM | POA: Diagnosis not present

## 2018-11-17 DIAGNOSIS — F4542 Pain disorder with related psychological factors: Secondary | ICD-10-CM | POA: Diagnosis not present

## 2018-11-17 DIAGNOSIS — G8929 Other chronic pain: Secondary | ICD-10-CM | POA: Diagnosis not present

## 2018-11-17 DIAGNOSIS — M5441 Lumbago with sciatica, right side: Secondary | ICD-10-CM | POA: Diagnosis not present

## 2018-11-25 ENCOUNTER — Ambulatory Visit (INDEPENDENT_AMBULATORY_CARE_PROVIDER_SITE_OTHER): Payer: Medicare Other | Admitting: Podiatry

## 2018-11-25 ENCOUNTER — Other Ambulatory Visit: Payer: Self-pay

## 2018-11-25 DIAGNOSIS — M21371 Foot drop, right foot: Secondary | ICD-10-CM

## 2018-11-25 DIAGNOSIS — M48062 Spinal stenosis, lumbar region with neurogenic claudication: Secondary | ICD-10-CM

## 2018-11-25 DIAGNOSIS — L97519 Non-pressure chronic ulcer of other part of right foot with unspecified severity: Secondary | ICD-10-CM

## 2018-11-25 DIAGNOSIS — I83015 Varicose veins of right lower extremity with ulcer other part of foot: Secondary | ICD-10-CM

## 2018-11-25 DIAGNOSIS — M2041 Other hammer toe(s) (acquired), right foot: Secondary | ICD-10-CM

## 2018-11-25 DIAGNOSIS — L97512 Non-pressure chronic ulcer of other part of right foot with fat layer exposed: Secondary | ICD-10-CM | POA: Diagnosis not present

## 2018-11-25 MED ORDER — CIPROFLOXACIN HCL 500 MG PO TABS: 500.0000 mg | ORAL_TABLET | Freq: Two times a day (BID) | ORAL | 0 refills | Status: DC

## 2018-11-27 ENCOUNTER — Other Ambulatory Visit: Payer: Self-pay | Admitting: Cardiology

## 2018-11-27 DIAGNOSIS — I110 Hypertensive heart disease with heart failure: Secondary | ICD-10-CM

## 2018-12-13 NOTE — Progress Notes (Signed)
Subjective:  68 year old male nondiabetic presents for follow-up treatment regarding an ulcer to the right second digit.  Patient has been applying Betadine daily with a light dressing.  He continues to wears a AFO brace and he took his oral Bactrim as prescribed on last visit on 11/11/2018.  He says that he thinks it is better.  He notices that his toes not as red and swollen and there is a decrease in symptoms.  He still notices some light bloody drainage to the bandage site.  He presents for follow-up treatment evaluation  Past Medical History:  Diagnosis Date  . A-fib- couple yrs now. 10/08/2016  . Aortic stenosis 11/06/2017  . Atherosclerotic heart disease of native coronary artery with unspecified angina pectoris ([place]) 10/08/2016  . CHF (congestive heart failure) ([place])   . Chronic anticoagulation 10/08/2016  . Foot drop- R 10/08/2016   Patient known foot drop for many years due to lumbar spinal stenosis.-Over the past couple of months symptoms have gotten worse.  He feels he is tripping more and cannot lift his foot up as much. Toes are curling and calluses on end of toes.    - Did see spinal surgeon many yrs ago.  Declined sx at that time.     . Generalized OA 10/08/2016   Ortho doc-  Dr. [facility] in [place]- both knees replaced 08, 09.     . H/O noncompliance with medical treatment, presenting hazards to health 10/22/2017  . Heart disease   . Hypertension   . Obesity, Class III, BMI 40-49.9 (morbid obesity) ([place]) 10/08/2016  . OSA (obstructive sleep apnea) 10/08/2016   Bi-PAP nitely- been 20+ yrs now  . Pulmonary hypertension ([place]) 10/08/2016  . Spinal stenosis, lumbar region, with neurogenic claudication 10/08/2016   Back specialist in [place]-    only surgeon he would use- pt declined referral here for back pain mgt    . Stroke [facility])      Objective/Physical Exam General: The patient is alert and oriented x3 in no acute distress.  Dermatology:  Wound #1 noted to the right 2nd toe measuring  0.80.8 x 0.1 cm (LxWxD).   To the noted ulceration(s), there is no eschar. There is a moderate amount of slough, fibrin, and necrotic tissue noted. Granulation tissue and wound base is red. There is a minimal amount of serosanguineous drainage noted. There is no exposed bone muscle-tendon ligament or joint. There is no malodor. Periwound integrity is intact. Redness and swelling localized to the right 2nd toe appears to be improved. Skin is warm, dry and supple bilateral lower extremities.  Vascular: Palpable pedal pulses bilaterally. Mild edema noted. Capillary refill within normal limits. Varicosities noted bilateral lower extremities.   Neurological: Epicritic and protective threshold diminished bilaterally.   Musculoskeletal Exam: Loss of dorsiflexion/eversion of the right foot. Range of motion within normal limits to all other pedal and ankle joints bilateral. Muscle strength 5/5 in all other groups bilateral with exception of loss of dorsiflexion to the right lower extremity secondary to lumbar spinal stenosis.  There is also a reducible hammertoe deformity noted to the second digit of the right foot contributing to the distal tuft ulceration noted above.  Assessment: #1 ulceration of the right 2nd toe secondary to venous insufficiency #2 varicosities bilateral lower extremities #3 Cellulitis right 2nd toe-improved #4 Foot drop RLE #5 hammertoe second digit right foot  Plan of Care:  #1 Patient was evaluated.  Cultures taken previously were reviewed today #2 medically necessary excisional debridement  including subcutaneous tissue was performed using a tissue nipper and a chisel blade. Excisional debridement of all the necrotic nonviable tissue down to healthy bleeding viable tissue was performed with post-debridement measurements same as pre-. #3 the wound was cleansed with normal saline. #4  Prescription for Cipro 500 mg 2 times daily #20.  There is some mild improvement of the  cellulitis however it is not resolved.  Prescription sent based on cultures taken last visit. #5 I also discussed with the patient that it may be beneficial that we perform a flexor tenotomy to the second digit hammertoe to alleviate pressure from the distal tuft ulceration.  Patient understands and agrees.  He says that he he is currently on anticoagulant therapy, Eliquis, and he should stop 3 days prior to the procedure and we will have him reinitiate the Eliquis after the procedures performed.  Return to clinic in 3 weeks once cellulitis has improved so we can proceed with the flexor tenotomy. #6 continue AFO brace to help the patient with his foot drop coming from lumbar stenosis that has progressively gotten worse with time.   Edrick Kins, DPM [facility]  Dr. Edrick Kins, [place]                                        [place], [place] [address]                Office [phone]  Fax [phone]

## 2018-12-16 ENCOUNTER — Encounter: Payer: Self-pay | Admitting: Podiatry

## 2018-12-16 ENCOUNTER — Ambulatory Visit (INDEPENDENT_AMBULATORY_CARE_PROVIDER_SITE_OTHER): Payer: Medicare Other | Admitting: Podiatry

## 2018-12-16 DIAGNOSIS — I83015 Varicose veins of right lower extremity with ulcer other part of foot: Secondary | ICD-10-CM

## 2018-12-16 DIAGNOSIS — L97512 Non-pressure chronic ulcer of other part of right foot with fat layer exposed: Secondary | ICD-10-CM | POA: Diagnosis not present

## 2018-12-16 DIAGNOSIS — L97519 Non-pressure chronic ulcer of other part of right foot with unspecified severity: Secondary | ICD-10-CM

## 2018-12-16 DIAGNOSIS — M2041 Other hammer toe(s) (acquired), right foot: Secondary | ICD-10-CM | POA: Diagnosis not present

## 2018-12-16 NOTE — Progress Notes (Signed)
   OPERATIVE REPORT Patient name: Joel Dominguez MRN: [id number] DOB: 08/21/1951  DOS:  12/16/18  Preop Dx: Reducible hammertoe deformity second digit right foot Postop Dx: same  Procedure:  1.  Flexor tenotomy right foot second digit  Surgeon: Edrick Kins DPM  Anesthesia: 50-50 mixture of 2% lidocaine plain with 0.5% Marcaine plain totaling 3 cc infiltrated in the patient's right second digit  Hemostasis: None  EBL: Minimal mL Materials: None Injectables: None Pathology: None  Condition: The patient tolerated the procedure and anesthesia well. No complications noted or reported   Justification for procedure: The patient is a 68 y.o. male who presents today for surgical correction of reducible hammertoe deformity with ulceration of the distal tuft of the second digit right foot.. All conservative modalities of been unsuccessful in providing any sort of satisfactory alleviation of symptoms with the patient. The patient was told benefits as well as possible side effects of the surgery. The patient consented for surgical correction. The patient consent form was reviewed. All patient questions were answered. No guarantees were expressed or implied. The patient and the surgeon boson the patient consent form with the witness present and placed in the patient's chart.   Procedure in Detail: Toe was prepped in aseptic manner using Betadine solution and a digital block was performed to the second digit right foot as mentioned above.  Procedure #1: Flexor tenotomy second digit right foot After appropriate preparation of the tendo surgical #11 blade was utilized to make a small stab incision approximately 0.3 cm in length to the plantar aspect of the second digit at the level of the PIPJ.  Flexor tendon was released and the toe was slightly dorsiflexed to release pressure from the distal tuft ulceration.  Betadine and dry sterile dressing was applied to the incision site.  Return to clinic  in 1 week for follow-up evaluation and treatment  Edrick Kins, DPM [facility]  Dr. Edrick Kins, [place]                                        [place], [place] [address]                Office [phone]  Fax [phone]

## 2018-12-16 NOTE — Progress Notes (Signed)
Subjective:  68 year old male nondiabetic presents for follow-up treatment regarding an ulcer to the right second digit.  Patient has been applying Betadine daily with a light dressing.  He continues to wears a AFO brace.  Patient recently finished his oral ciprofloxacin 500 mg 2 times daily #20.  He states that the ulcer appears significantly improved however he continues to have the hammertoe contracture applying pressure to the distal tuft of the toe.  Last week we discussed performing flexor tenotomy to alleviate pressure from the distal tuft of the second digit since the hammertoe is somewhat reducible.  Patient has stopped taking his Eliquis for the past 3 days to control bleeding.  He presents today for flexor tenotomy and treatment.  Past Medical History:  Diagnosis Date  . A-fib- couple yrs now. 10/08/2016  . Aortic stenosis 11/06/2017  . Atherosclerotic heart disease of native coronary artery with unspecified angina pectoris ([place]) 10/08/2016  . CHF (congestive heart failure) ([place])   . Chronic anticoagulation 10/08/2016  . Foot drop- R 10/08/2016   Patient known foot drop for many years due to lumbar spinal stenosis.-Over the past couple of months symptoms have gotten worse.  He feels he is tripping more and cannot lift his foot up as much. Toes are curling and calluses on end of toes.    - Did see spinal surgeon many yrs ago.  Declined sx at that time.     . Generalized OA 10/08/2016   Ortho doc-  Dr. [facility] in [place]- both knees replaced 08, 09.     . H/O noncompliance with medical treatment, presenting hazards to health 10/22/2017  . Heart disease   . Hypertension   . Obesity, Class III, BMI 40-49.9 (morbid obesity) ([place]) 10/08/2016  . OSA (obstructive sleep apnea) 10/08/2016   Bi-PAP nitely- been 20+ yrs now  . Pulmonary hypertension ([place]) 10/08/2016  . Spinal stenosis, lumbar region, with neurogenic claudication 10/08/2016   Back specialist in [place]-    only surgeon he would use- pt  declined referral here for back pain mgt    . Stroke [facility])      Objective/Physical Exam General: The patient is alert and oriented x3 in no acute distress.  Dermatology:  Wound #1 noted to the right 2nd toe measuring 0.80.8 x 0.1 cm (LxWxD).   To the noted ulceration(s), there is no eschar. There is a moderate amount of slough, fibrin, and necrotic tissue noted. Granulation tissue and wound base is red. There is a minimal amount of serosanguineous drainage noted. There is no exposed bone muscle-tendon ligament or joint. There is no malodor. Periwound integrity is intact. Redness and swelling localized to the right 2nd toe appears to be improved. Skin is warm, dry and supple bilateral lower extremities.  Vascular: Palpable pedal pulses bilaterally. Mild edema noted. Capillary refill within normal limits. Varicosities noted bilateral lower extremities.   Neurological: Epicritic and protective threshold diminished bilaterally.   Musculoskeletal Exam: Loss of dorsiflexion/eversion of the right foot. Range of motion within normal limits to all other pedal and ankle joints bilateral. Muscle strength 5/5 in all other groups bilateral with exception of loss of dorsiflexion to the right lower extremity secondary to lumbar spinal stenosis.  There is also a reducible hammertoe deformity noted to the second digit of the right foot contributing to the distal tuft ulceration noted above.  Assessment: #1 ulceration of the right 2nd toe secondary to venous insufficiency #2 varicosities bilateral lower extremities #3 Cellulitis right 2nd toe- resolved #4  Foot drop RLE #5 hammertoe second digit right foot  Plan of Care:  #1 Patient was evaluated.   #2 medically necessary excisional debridement including subcutaneous tissue was performed using a tissue nipper and a chisel blade. Excisional debridement of all the necrotic nonviable tissue down to healthy bleeding viable tissue was performed with  post-debridement measurements same as pre-. #3 the wound was cleansed with normal saline. #4  Today we discussed the benefits and disadvantages of performing a flexor tenotomy.  All possible complications and details the procedure were explained.  No guarantees were expressed or implied.  Patient agrees for flexor tenotomy of the second digit right foot.  Please see operative report in separate note.  Prior to the procedure 3 cc of 2% lidocaine utilized in a digital block fashion. #5 dry sterile dressings were applied with a postoperative shoe.  Instructions to be weightbearing as tolerated surgical extremity #6 continue AFO brace to help the patient with his foot drop coming from lumbar stenosis that has progressively gotten worse with time. #7 return to clinic in 1 week   Edrick Kins, DPM [facility]  Dr. Edrick Kins, [address]                                        [place], [place] [address]                Office [phone]  Fax [phone]

## 2018-12-23 ENCOUNTER — Encounter: Payer: Self-pay | Admitting: Podiatry

## 2018-12-23 ENCOUNTER — Ambulatory Visit (INDEPENDENT_AMBULATORY_CARE_PROVIDER_SITE_OTHER): Payer: Medicare Other | Admitting: Podiatry

## 2018-12-23 DIAGNOSIS — I83015 Varicose veins of right lower extremity with ulcer other part of foot: Secondary | ICD-10-CM

## 2018-12-23 DIAGNOSIS — M2041 Other hammer toe(s) (acquired), right foot: Secondary | ICD-10-CM

## 2018-12-23 DIAGNOSIS — L97512 Non-pressure chronic ulcer of other part of right foot with fat layer exposed: Secondary | ICD-10-CM | POA: Diagnosis not present

## 2018-12-23 DIAGNOSIS — L97519 Non-pressure chronic ulcer of other part of right foot with unspecified severity: Secondary | ICD-10-CM

## 2018-12-27 NOTE — Progress Notes (Signed)
Subjective:  68 year old male presenting today for follow up evaluation of an ulceration of the right second toe. He states he is doing well. He denies any significant pain or modifying factors. Patient is here for further evaluation and treatment.  Past Medical History:  Diagnosis Date  . A-fib- couple yrs now. 10/08/2016  . Aortic stenosis 11/06/2017  . Atherosclerotic heart disease of native coronary artery with unspecified angina pectoris ([place]) 10/08/2016  . CHF (congestive heart failure) ([place])   . Chronic anticoagulation 10/08/2016  . Foot drop- R 10/08/2016   Patient known foot drop for many years due to lumbar spinal stenosis.-Over the past couple of months symptoms have gotten worse.  He feels he is tripping more and cannot lift his foot up as much. Toes are curling and calluses on end of toes.    - Did see spinal surgeon many yrs ago.  Declined sx at that time.     . Generalized OA 10/08/2016   Ortho doc-  Dr. [facility] in [place]- both knees replaced 08, 09.     . H/O noncompliance with medical treatment, presenting hazards to health 10/22/2017  . Heart disease   . Hypertension   . Obesity, Class III, BMI 40-49.9 (morbid obesity) ([place]) 10/08/2016  . OSA (obstructive sleep apnea) 10/08/2016   Bi-PAP nitely- been 20+ yrs now  . Pulmonary hypertension ([place]) 10/08/2016  . Spinal stenosis, lumbar region, with neurogenic claudication 10/08/2016   Back specialist in [place]-    only surgeon he would use- pt declined referral here for back pain mgt    . Stroke [facility])      Objective/Physical Exam General: The patient is alert and oriented x3 in no acute distress.  Dermatology:  Wound #1 noted to the right 2nd toe measuring 0.6 x 0.4 x 0.2 cm (LxWxD).   To the noted ulceration(s), there is no eschar. There is a moderate amount of slough, fibrin, and necrotic tissue noted. Granulation tissue and wound base is red. There is a minimal amount of serosanguineous drainage noted. There is no exposed bone  muscle-tendon ligament or joint. There is no malodor. Periwound integrity is intact. Redness and swelling localized to the right 2nd toe appears to be improved. Skin is warm, dry and supple bilateral lower extremities.  Vascular: Palpable pedal pulses bilaterally. Mild edema noted. Capillary refill within normal limits. Varicosities noted bilateral lower extremities.   Neurological: Epicritic and protective threshold diminished bilaterally.   Musculoskeletal Exam: Loss of dorsiflexion/eversion of the right foot. Range of motion within normal limits to all other pedal and ankle joints bilateral. Muscle strength 5/5 in all other groups bilateral with exception of loss of dorsiflexion to the right lower extremity secondary to lumbar spinal stenosis.  There is also a reducible hammertoe deformity noted to the second digit of the right foot contributing to the distal tuft ulceration noted above.  Assessment: #1 ulceration of the right 2nd toe secondary to venous insufficiency #2 varicosities bilateral lower extremities #3 Cellulitis right 2nd toe- resolved #4 Foot drop RLE #5 hammertoe second digit right foot #6 s/p flexor tenotomy right second toe  Plan of Care:  #1 Patient was evaluated.   #2 medically necessary excisional debridement including subcutaneous tissue was performed using a tissue nipper and a chisel blade. Excisional debridement of all the necrotic nonviable tissue down to healthy bleeding viable tissue was performed with post-debridement measurements same as pre-. #3 the wound was cleansed with normal saline. #4 Recommended Betadine ointment daily with a  bandage.  #5 Continue using AFO brace.  #6 Return to clinic in 2 weeks.    Edrick Kins, DPM [facility]  Dr. Edrick Kins, [address]                                        [place], [place] [address]                Office [phone]  Fax [phone]

## 2018-12-28 ENCOUNTER — Other Ambulatory Visit: Payer: Self-pay | Admitting: Family Medicine

## 2018-12-28 ENCOUNTER — Other Ambulatory Visit: Payer: Self-pay | Admitting: Cardiology

## 2018-12-28 DIAGNOSIS — I10 Essential (primary) hypertension: Secondary | ICD-10-CM

## 2019-01-06 ENCOUNTER — Ambulatory Visit (INDEPENDENT_AMBULATORY_CARE_PROVIDER_SITE_OTHER): Payer: Medicare Other | Admitting: Podiatry

## 2019-01-06 ENCOUNTER — Encounter: Payer: Self-pay | Admitting: Podiatry

## 2019-01-06 DIAGNOSIS — L97512 Non-pressure chronic ulcer of other part of right foot with fat layer exposed: Secondary | ICD-10-CM

## 2019-01-06 DIAGNOSIS — M2041 Other hammer toe(s) (acquired), right foot: Secondary | ICD-10-CM

## 2019-01-06 DIAGNOSIS — L97519 Non-pressure chronic ulcer of other part of right foot with unspecified severity: Secondary | ICD-10-CM

## 2019-01-06 DIAGNOSIS — I83015 Varicose veins of right lower extremity with ulcer other part of foot: Secondary | ICD-10-CM

## 2019-01-11 DIAGNOSIS — Z5181 Encounter for therapeutic drug level monitoring: Secondary | ICD-10-CM | POA: Diagnosis not present

## 2019-01-11 DIAGNOSIS — G8929 Other chronic pain: Secondary | ICD-10-CM | POA: Diagnosis not present

## 2019-01-11 DIAGNOSIS — M5441 Lumbago with sciatica, right side: Secondary | ICD-10-CM | POA: Diagnosis not present

## 2019-01-11 DIAGNOSIS — G894 Chronic pain syndrome: Secondary | ICD-10-CM | POA: Diagnosis not present

## 2019-01-11 DIAGNOSIS — M47816 Spondylosis without myelopathy or radiculopathy, lumbar region: Secondary | ICD-10-CM | POA: Diagnosis not present

## 2019-01-11 DIAGNOSIS — M21371 Foot drop, right foot: Secondary | ICD-10-CM | POA: Diagnosis not present

## 2019-01-11 DIAGNOSIS — Z79899 Other long term (current) drug therapy: Secondary | ICD-10-CM | POA: Diagnosis not present

## 2019-01-11 NOTE — Progress Notes (Signed)
Subjective:  68 year old male presenting today for follow up evaluation of an ulceration of the right second toe. He states he is doing well and the wound has improved. He denies any significant pain or modifying factors. Patient is here for further evaluation and treatment.  Past Medical History:  Diagnosis Date  . A-fib- couple yrs now. 10/08/2016  . Aortic stenosis 11/06/2017  . Atherosclerotic heart disease of native coronary artery with unspecified angina pectoris ([place]) 10/08/2016  . CHF (congestive heart failure) ([place])   . Chronic anticoagulation 10/08/2016  . Foot drop- R 10/08/2016   Patient known foot drop for many years due to lumbar spinal stenosis.-Over the past couple of months symptoms have gotten worse.  He feels he is tripping more and cannot lift his foot up as much. Toes are curling and calluses on end of toes.    - Did see spinal surgeon many yrs ago.  Declined sx at that time.     . Generalized OA 10/08/2016   Ortho doc-  Dr. [facility] in [place]- both knees replaced 08, 09.     . H/O noncompliance with medical treatment, presenting hazards to health 10/22/2017  . Heart disease   . Hypertension   . Obesity, Class III, BMI 40-49.9 (morbid obesity) ([place]) 10/08/2016  . OSA (obstructive sleep apnea) 10/08/2016   Bi-PAP nitely- been 20+ yrs now  . Pulmonary hypertension ([place]) 10/08/2016  . Spinal stenosis, lumbar region, with neurogenic claudication 10/08/2016   Back specialist in [place]-    only surgeon he would use- pt declined referral here for back pain mgt    . Stroke [facility])      Objective/Physical Exam General: The patient is alert and oriented x3 in no acute distress.  Dermatology: Wound noted to the right 2nd toe has healed. Complete re-epithelialization has occurred. No drainage noted.   Vascular: Palpable pedal pulses bilaterally. Mild edema noted. Capillary refill within normal limits. Varicosities noted bilateral lower extremities.   Neurological: Epicritic and  protective threshold diminished bilaterally.   Musculoskeletal Exam: Loss of dorsiflexion/eversion of the right foot. Range of motion within normal limits to all other pedal and ankle joints bilateral. Muscle strength 5/5 in all other groups bilateral with exception of loss of dorsiflexion to the right lower extremity secondary to lumbar spinal stenosis.  There is also a reducible hammertoe deformity noted to the second digit of the right foot contributing to the distal tuft ulceration noted above.  Assessment: #1 ulceration of the right 2nd toe secondary to venous insufficiency - resolved #2 varicosities bilateral lower extremities #3 Cellulitis right 2nd toe- resolved #4 Foot drop RLE #5 hammertoe second digit right foot #6 s/p flexor tenotomy right second toe  Plan of Care:  #1 Patient was evaluated.   #2 Light debridement of right 2nd toe wound perform with a tissue nipper.  #3 Continue using AFO brace with custom insoles and shoes.  #4 Return to clinic in 3 months for routine care.    Edrick Kins, DPM [facility]  Dr. Edrick Kins, [address]                                        [place],  [address]                Office [phone]  Fax [phone]

## 2019-01-12 DIAGNOSIS — M5417 Radiculopathy, lumbosacral region: Secondary | ICD-10-CM | POA: Diagnosis not present

## 2019-02-22 ENCOUNTER — Telehealth: Payer: Self-pay

## 2019-02-22 NOTE — Telephone Encounter (Signed)
Left message for patient to return call to schedule appointment with Dr Bettina Gavia as he is due to follow up in April.

## 2019-02-23 ENCOUNTER — Ambulatory Visit (INDEPENDENT_AMBULATORY_CARE_PROVIDER_SITE_OTHER): Payer: Medicare Other | Admitting: Family Medicine

## 2019-02-23 ENCOUNTER — Encounter: Payer: Self-pay | Admitting: Family Medicine

## 2019-02-23 ENCOUNTER — Other Ambulatory Visit: Payer: Self-pay

## 2019-02-23 VITALS — BP 139/67 | HR 76 | Temp 97.7°F | Ht 72.0 in | Wt 330.0 lb

## 2019-02-23 DIAGNOSIS — M542 Cervicalgia: Secondary | ICD-10-CM | POA: Diagnosis not present

## 2019-02-23 DIAGNOSIS — I639 Cerebral infarction, unspecified: Secondary | ICD-10-CM

## 2019-02-23 DIAGNOSIS — I482 Chronic atrial fibrillation, unspecified: Secondary | ICD-10-CM

## 2019-02-23 DIAGNOSIS — I5042 Chronic combined systolic (congestive) and diastolic (congestive) heart failure: Secondary | ICD-10-CM

## 2019-02-23 DIAGNOSIS — I110 Hypertensive heart disease with heart failure: Secondary | ICD-10-CM

## 2019-02-23 DIAGNOSIS — Z7901 Long term (current) use of anticoagulants: Secondary | ICD-10-CM | POA: Diagnosis not present

## 2019-02-23 DIAGNOSIS — E669 Obesity, unspecified: Secondary | ICD-10-CM

## 2019-02-23 DIAGNOSIS — M25512 Pain in left shoulder: Secondary | ICD-10-CM

## 2019-02-23 DIAGNOSIS — E559 Vitamin D deficiency, unspecified: Secondary | ICD-10-CM | POA: Diagnosis not present

## 2019-02-23 DIAGNOSIS — G4730 Sleep apnea, unspecified: Secondary | ICD-10-CM | POA: Diagnosis not present

## 2019-02-23 NOTE — Progress Notes (Signed)
Virtual Visit via Video -  Note for [employer], D.O- Primary Care Physician at [facility]   I connected with current patient today by telephone/ video and verified that I am speaking with the correct person using two identifiers.    This visit type was conducted due to national recommendations for restrictions regarding the COVID-19 Pandemic (e.g. social distancing) in an effort to limit this patient's exposure and mitigate transmission in our community.  Due to her co-morbid illnesses, this patient is at least at moderate risk for complications without adequate follow up.  This format is felt to be most appropriate for this patient at this time.  The patient did not have access to video technology/had technical difficulties with video requiring transitioning to audio format only (telephone).  No physical exam could be performed with this format, beyond that communicated to [place] by the patient/ family members as noted.  Additionally my staff members also discussed with the patient that there may be a patient charge related to this service.   The patient expressed understanding, and agreed to proceed.      History of Present Illness:   Last saw patient 10/21/2018.  At that time we referred him to a podiatrist for his right foot discomfort and callus care/ foot care.  He has been following up with Dr. Amalia Hailey of podiatry.  I reviewed his notes today.   Patient has also been following up with [facility] in [place] for his chronic back pain and leg pain.- Dr. Maryruth Eve and his team.  Notes are in care-everywhere and this was reviewed today.  Patient recently had a trial nerve stimulator placed.  Apparently per patient, he did well with this and felt that it possibly decreased his pain.  Due to Covid-19, they did not schedule a time to put the chronic neurostimulator in.  All of patient's chronic pain medicines were given to him by his pain physician.  He is doing okay  today.   --> He never did come in to get a full set of fasting labs as previously instructed to and we really have not had any since 12/ 2018.  Importance of this discussed with patient today.   OSA: Continues on CPAP nightly.  No complaints.  He does keep his bed elevated which does help with his OSA as well as CHF and restrictive lung disease due to his morbid obesity.  No complaints or concerns today.  Using machine nightly.   Chronic A. fib on chronic anticoagulation: Follows with cardiology Dr. Bettina Gavia of cardiology.  Patient denies any cardiac arrhythmias, chest pain, shortness of breath, dizziness, heart palpitations etc.   Hypertensive heart disease/ CHF: Blood pressures at home have been running good per patient.  Usually in the 120s-130s over 60s-70s.   His blood pressure was 139/67 today.  He only checks it every 2 to 3 weeks and I recommend he does more often.   Patient has acute complaints today of pain-left neck radiating to left shoulder.  He was hitting ball to grandchildren children-about 7 days ago--2 to 3 days later he felt a pinching sharp pain in his left side of his neck that seem to radiate into his left shoulder.  He felt he pinched nerve in neck.   If patient lifts arm up past 90 degrees abduction-->  Hurts more.  Describes pain as "sharp pains ".   He states he was not too worried about it.  He has been using  Biofreeze on it and it has slowly been improving.  He does not have any disability because of it or neuropathy symptoms distally.      Wt Readings from Last 3 Encounters:  02/23/19 (!) 330 lb (149.7 kg)  10/21/18 300 lb (136.1 kg)  10/14/18 300 lb (136.1 kg)    BP Readings from Last 3 Encounters:  02/23/19 139/67  11/11/18 (!) 134/50  10/21/18 140/78    Pulse Readings from Last 3 Encounters:  02/23/19 76  11/11/18 83  10/21/18 78    BMI Readings from Last 3 Encounters:  02/23/19 44.76 kg/m  10/21/18 40.69 kg/m  10/14/18 40.69 kg/m       -Vitals obtained; Medications, allergies reconciled;  personal medical, social, Sx etc. etc. histories were updated by Lanier Prude the medical assistant today and are reflected in below chart   Patient Care Team    Relationship Specialty Notifications Start End  Mellody Dance, DO PCP - General Family Medicine  10/08/16   Lucia Bitter., MD Physician Assistant Pain Medicine  11/13/17   Richardo Priest, MD Consulting Physician Cardiology  11/13/17   Edrick Kins, DPM Consulting Physician Podiatry  02/23/19      Patient Active Problem List   Diagnosis Date Noted   Positive for microalbuminuria 11/19/2017    Priority: High   Hypertensive heart disease with heart failure ([place]) 11/10/2017    Priority: High   OSA (obstructive sleep apnea) 10/08/2016    Priority: High   Pulmonary hypertension ([place]) 10/08/2016    Priority: High   Obesity, Class III, BMI 40-49.9 (morbid obesity) ([place]) 10/08/2016    Priority: High   h/o Stroke 10/08/2016    Priority: Medium   Atherosclerotic heart disease of native coronary artery with unspecified angina pectoris ([place]) 10/08/2016    Priority: Medium   Chronic combined systolic and diastolic heart failure ([place]) 10/08/2016    Priority: Medium   Chronic atrial fibrillation 10/08/2016    Priority: Medium   Chronic anticoagulation 10/08/2016    Priority: Medium   Generalized OA 10/08/2016    Priority: Low   Foot drop- R 10/08/2016    Priority: Low   Lumbar spinal stenosis 10/08/2016    Priority: Low   Chronic bilateral low back pain with right-sided sciatica 03/03/2018   Lumbosacral radiculopathy 01/22/2018   Osteoarthritis 11/19/2017   Vitamin D deficiency 11/19/2017   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 fascicular block) 11/07/2017   Aortic stenosis 11/06/2017   Hypertension    H/O noncompliance with medical treatment, presenting hazards to health 10/22/2017     Current Meds  Medication Sig    bumetanide (BUMEX) 2 MG tablet TAKE 1 TABLET BY MOUTH 2 TIMES DAILY.   carvedilol (COREG) 25 MG tablet TAKE 1 TABLET BY MOUTH 2 TIMES DAILY WITH A MEAL.   ELIQUIS 5 MG TABS tablet TAKE 1 TABLET BY MOUTH 2 TIMES DAILY.   HYDROcodone-acetaminophen (NORCO) 10-325 MG tablet Take 1 tablet by mouth as needed.   Multiple Vitamin (MULTIVITAMIN) tablet Take 1 tablet by mouth daily.   potassium chloride (K-DUR) 10 MEQ tablet TAKE 2 TABLETS BY MOUTH 2 TIMES DAILY.   spironolactone (ALDACTONE) 25 MG tablet TAKE 1 TABLET BY MOUTH DAILY.   telmisartan (MICARDIS) 80 MG tablet TAKE 1 TABLET BY MOUTH DAILY.   Vitamin D, Ergocalciferol, (DRISDOL) 50000 units CAPS capsule Take 1 capsule (50,000 Units total) by mouth every 7 (seven) days.     Allergies:  Allergies  Allergen Reactions  Cephalexin Rash     ROS:  See above HPI for pertinent positives and negatives   Objective:   Blood pressure 139/67, pulse 76, temperature 97.7 F (36.5 C), height 6' (1.829 m), weight (!) 330 lb (149.7 kg). (if some vitals are omitted, this means that patient was UNABLE to obtain them even though asked to get them prior to OV today) General: sounds in no acute distress.  Skin: Pt confirms warm and dry  extremities and pink fingertips Respiratory: speaking in full sentences, no conversational dyspnea Psych: A and O *3, appears insight good, mood- full      Impression and Recommendations:      ICD-10-CM   1. Chronic combined systolic and diastolic heart failure (HCC) I50.42   2. h/o Cerebrovascular accident (CVA), unspecified mechanism ([place]) I63.9   3. Obesity, Class III, BMI 40-49.9 (morbid obesity) (HCC) E66.01   4. Hypertensive heart disease with heart failure ([place]) I11.0   5. Chronic atrial fibrillation I48.20   6. Chronic anticoagulation Z79.01   7. Obesity with sleep apnea G47.30    E66.9   8. Vitamin D deficiency E55.9   9. Neck pain on left side M54.2   10. Acute pain of left shoulder  M25.512     1: CHF symptoms are stable at this time.  No acute complaints.  2: Importance of monitoring his cholesterol at least once yearly in addition to more regular monitoring of his blood pressure discussed with patient today.  He was encouraged to come in in the near future for his Medicare wellness and a full set of fasting blood work.  3: Did discuss briefly with patient how his morbid obesity is affecting his multiple chronic conditions.  He finds it difficult to walk due to his chronic pain and we did discuss dietary monitoring with programs such as weight watchers etc.   today, patient appeared unmotivated to make changes  4: Encouraged to check his blood pressure more often then once every 3 to 4 weeks.  Appears to be under good control and patient is asymptomatic however, checking it twice weekly or at least once weekly was highly encouraged.  Encouraged to keep a log and write it down and bring in with each office visit to his cardiologist as well as me and other specialist.  Did discuss dietary and lifestyle modifications to include D ASH -diet, weight loss etc. Etc.  5/6: Patient remains asymptomatic and stable at this time.  Does follow-up regularly with his cardiologist for cardiovascular chronic care  7: Using his CPAP nightly.  No acute complaints.  OSA symptoms well controlled.  9/10: Did discuss with patient we can send him in some Flexeril as it seems to be more musculoskeletal in nature since the pain did not come on until 2 days after him doing an activity he never does.  Furthermore, by sitting in the chair and hitting the balls to his grandchildren, this further put stress on his upper back since he could not rotate his torso.  We discussed that likely this is muscle tightness and spasms that are causing the pain.  I recommend ice cup massage followed by heating pads for further relief.  Since patient's pain is decreasing, he declined Flexeril at this time.  We will treat if  needed in the future.   As part of my medical decision making, I reviewed the following data within the [place] History obtained from pt/family, CMA notes reviewed and incorporated, Labs reviewed, Radiograph/ tests  reviewed if applicable and OV notes from prior OV's with me, as well as other specialists he has seen since seeing me last, were all reviewed and used in my medical decision making process today. Additionally, discussion had with patient regarding txmnt plan, their biases about that plan etc were used in my medical decision making today.  I discussed the assessment and treatment plan with the patient. The patient was provided an opportunity to ask questions and all were answered.  The patient agreed with the plan and demonstrated an understanding of the instructions.   No barriers to understanding were identified.  Red flag symptoms and signs discussed in detail.  Patient expressed understanding regarding what to do in case of emergency\urgent symptoms   The patient was advised to call back or seek an in-person evaluation if the symptoms worsen or if the condition fails to improve as anticipated.   Return for FBW-near future along with Medicare wellness, F-up of current med issues as previously d/c pt.    **Gross side effects, risk and benefits, and alternatives of medications and treatment plan in general discussed with patient.  Patient is aware that all medications have potential side effects and we are unable to predict every side effect or drug-drug interaction that may occur.   Patient was strongly encouraged to call with any questions or concerns they may have concerns.     I provided 25 + minutes of non-face-to-face time during this encounter (actually was via video OV so, was face-to-face in reality), with over 50% of the time in direct counseling on medical conditions.  Additional time was spent with charting and coordination of care.   Mellody Dance, DO

## 2019-02-24 NOTE — Telephone Encounter (Signed)
YOUR CARDIOLOGY TEAM HAS ARRANGED FOR AN E-VISIT FOR YOUR APPOINTMENT - PLEASE REVIEW IMPORTANT INFORMATION BELOW SEVERAL DAYS PRIOR TO YOUR APPOINTMENT  Due to the recent COVID-19 pandemic, we are transitioning in-person office visits to tele-medicine visits in an effort to decrease unnecessary exposure to our patients, their families, and staff. These visits are billed to your insurance just like a normal visit is. We also encourage you to sign up for MyChart if you have not already done so. You will need a smartphone if possible. For patients that do not have this, we can still complete the visit using a regular telephone but do prefer a smartphone to enable video when possible. You may have a family member that lives with you that can help. If possible, we also ask that you have a blood pressure cuff and scale at home to measure your blood pressure, heart rate and weight prior to your scheduled appointment. Patients with clinical needs that need an in-person evaluation and testing will still be able to come to the office if absolutely necessary. If you have any questions, feel free to call our office.     YOUR PROVIDER WILL BE USING THE FOLLOWING PLATFORM TO COMPLETE YOUR VISIT: DoxyME   . IF USING DOXIMITY or [email or web address].ME - The staff will give you instructions on receiving your link to join the meeting the day of your visit.      2-3 DAYS BEFORE YOUR APPOINTMENT  You will receive a telephone call from one of our [place] team members - your caller ID may say "Unknown caller." If this is a video visit, we will walk you through how to get the video launched on your phone. We will remind you check your blood pressure, heart rate and weight prior to your scheduled appointment. If you have an Apple Watch or Kardia, please upload any pertinent ECG strips the day before or morning of your appointment to [facility]. Our staff will also make sure you have reviewed the consent and agree to move forward with  your scheduled tele-health visit.     THE DAY OF YOUR APPOINTMENT  Approximately 15 minutes prior to your scheduled appointment, you will receive a telephone call from one of [place] team - your caller ID may say "Unknown caller."  Our staff will confirm medications, vital signs for the day and any symptoms you may be experiencing. Please have this information available prior to the time of visit start. It may also be helpful for you to have a pad of paper and pen handy for any instructions given during your visit. They will also walk you through joining the smartphone meeting if this is a video visit.    CONSENT FOR TELE-HEALTH VISIT - PLEASE REVIEW  I hereby voluntarily request, consent and authorize [facility] and its employed or contracted physicians, physician assistants, nurse practitioners or other licensed health care professionals (the Practitioner), to provide me with telemedicine health care services (the "Services") as deemed necessary by the treating Practitioner. I acknowledge and consent to receive the Services by the Practitioner via telemedicine. I understand that the telemedicine visit will involve communicating with the Practitioner through live audiovisual communication technology and the disclosure of certain medical information by electronic transmission. I acknowledge that I have been given the opportunity to request an in-person assessment or other available alternative prior to the telemedicine visit and am voluntarily participating in the telemedicine visit.  I understand that I have the right to withhold or withdraw my consent to  the use of telemedicine in the course of my care at any time, without affecting my right to future care or treatment, and that the Practitioner or I may terminate the telemedicine visit at any time. I understand that I have the right to inspect all information obtained and/or recorded in the course of the telemedicine visit and may receive  copies of available information for a reasonable fee.  I understand that some of the potential risks of receiving the Services via telemedicine include:  Marland Kitchen Delay or interruption in medical evaluation due to technological equipment failure or disruption; . Information transmitted may not be sufficient (e.g. poor resolution of images) to allow for appropriate medical decision making by the Practitioner; and/or  . In rare instances, security protocols could fail, causing a breach of personal health information.  Furthermore, I acknowledge that it is my responsibility to provide information about my medical history, conditions and care that is complete and accurate to the best of my ability. I acknowledge that Practitioner's advice, recommendations, and/or decision may be based on factors not within their control, such as incomplete or inaccurate data provided by me or distortions of diagnostic images or specimens that may result from electronic transmissions. I understand that the practice of medicine is not an exact science and that Practitioner makes no warranties or guarantees regarding treatment outcomes. I acknowledge that I will receive a copy of this consent concurrently upon execution via email to the email address I last provided but may also request a printed copy by calling the office of [facility].    I understand that my insurance will be billed for this visit.   I have read or had this consent read to me. . I understand the contents of this consent, which adequately explains the benefits and risks of the Services being provided via telemedicine.  . I have been provided ample opportunity to ask questions regarding this consent and the Services and have had my questions answered to my satisfaction. . I give my informed consent for the services to be provided through the use of telemedicine in my medical care  By participating in this telemedicine visit I agree to the above.  Patient gave  verbal consent to virtual visit with Dr Bettina Gavia on 03-02-19.  Copy of consent sent in [place].

## 2019-03-01 NOTE — Progress Notes (Signed)
Virtual Visit via Video Note   This visit type was conducted due to national recommendations for restrictions regarding the COVID-19 Pandemic (e.g. social distancing) in an effort to limit this patient's exposure and mitigate transmission in our community.  Due to his co-morbid illnesses, this patient is at least at moderate risk for complications without adequate follow up.  This format is felt to be most appropriate for this patient at this time.  All issues noted in this document were discussed and addressed.  A limited physical exam was performed with this format.  Please refer to the patient's chart for his consent to telehealth for [facility].   Evaluation Performed:  Follow-up visit  Date:  03/02/2019   ID:  Joel Dominguez, DOB 04-16-1951, MRN [id number]  Patient Location: Home Provider Location: Home  PCP:  Joel Dance, DO  Cardiologist:  No primary care provider on file. Dr Bettina Gavia Electrophysiologist:  None   Chief Complaint:  Heart failure  History of Present Illness:    Joel Dominguez is a 68 y.o. male with a hx of chronic atrial fibrillation, mild aortic stenosis on echo 2016, heart failure, RBBB with LAHB, hypertension, anticoagulation and PAH  last seen 08/28/18.  The patient does not have symptoms concerning for COVID-19 infection (fever, chills, cough, or new shortness of breath).   Overall he is done well remains active he is doing garden work today and does not have shortness of breath unless he does heavy lifting.  No edema orthopnea chest pain palpitation or syncope and no bleeding complication of his anti-coagulant.  He practices social distance wears a mask and good handwashing technique Past Medical History:  Diagnosis Date  . A-fib- couple yrs now. 10/08/2016  . Aortic stenosis 11/06/2017  . Atherosclerotic heart disease of native coronary artery with unspecified angina pectoris ([place]) 10/08/2016  . CHF (congestive heart failure) ([place])   . Chronic  anticoagulation 10/08/2016  . Foot drop- R 10/08/2016   Patient known foot drop for many years due to lumbar spinal stenosis.-Over the past couple of months symptoms have gotten worse.  He feels he is tripping more and cannot lift his foot up as much. Toes are curling and calluses on end of toes.    - Did see spinal surgeon many yrs ago.  Declined sx at that time.     . Generalized OA 10/08/2016   Ortho doc-  Dr. [facility] in [place]- both knees replaced 08, 09.     . H/O noncompliance with medical treatment, presenting hazards to health 10/22/2017  . Heart disease   . Hypertension   . Obesity, Class III, BMI 40-49.9 (morbid obesity) ([place]) 10/08/2016  . OSA (obstructive sleep apnea) 10/08/2016   Bi-PAP nitely- been 20+ yrs now  . Pulmonary hypertension ([place]) 10/08/2016  . Spinal stenosis, lumbar region, with neurogenic claudication 10/08/2016   Back specialist in [place]-    only surgeon he would use- pt declined referral here for back pain mgt    . Stroke [facility])    Past Surgical History:  Procedure Laterality Date  . REPLACEMENT TOTAL KNEE Bilateral   . TIBIA FRACTURE SURGERY       Current Meds  Medication Sig  . bumetanide (BUMEX) 2 MG tablet TAKE 1 TABLET BY MOUTH 2 TIMES DAILY.  . carvedilol (COREG) 25 MG tablet TAKE 1 TABLET BY MOUTH 2 TIMES DAILY WITH A MEAL.  Marland Kitchen ELIQUIS 5 MG TABS tablet TAKE 1 TABLET BY MOUTH 2 TIMES DAILY.  Marland Kitchen HYDROcodone-acetaminophen ([place])  10-325 MG tablet Take 1 tablet by mouth as needed.  . Multiple Vitamin (MULTIVITAMIN) tablet Take 1 tablet by mouth daily.  . potassium chloride (K-DUR) 10 MEQ tablet TAKE 2 TABLETS BY MOUTH 2 TIMES DAILY.  Marland Kitchen spironolactone (ALDACTONE) 25 MG tablet TAKE 1 TABLET BY MOUTH DAILY.  Marland Kitchen telmisartan (MICARDIS) 80 MG tablet TAKE 1 TABLET BY MOUTH DAILY.  Marland Kitchen Vitamin D, Ergocalciferol, (DRISDOL) 50000 units CAPS capsule Take 1 capsule (50,000 Units total) by mouth every 7 (seven) days.     Allergies:   Cephalexin   Social History   Tobacco  Use  . Smoking status: Never Smoker  . Smokeless tobacco: Never Used  Substance Use Topics  . Alcohol use: Yes    Comment: once monthly  . Drug use: No     Family Hx: The patient's family history includes Cancer in his father; Congestive Heart Failure in his mother and sister; Diabetes in his brother and mother; Hypertension in his brother, mother, sister, and sister.  ROS:   Please see the history of present illness.     All other systems reviewed and are negative.   Prior CV studies:   The following studies were reviewed today:   Labs/Other Tests and Data Reviewed:    EKG:  No ECG reviewed.  Recent Labs: 08/28/2018: BUN 24; Creatinine, Ser 1.27; NT-Pro BNP 447; Potassium 4.4; Sodium 142   Recent Lipid Panel Lab Results  Component Value Date/Time   CHOL 161 10/22/2017 04:08 PM   TRIG 129 10/22/2017 04:08 PM   HDL 43 10/22/2017 04:08 PM   CHOLHDL 3.7 10/22/2017 04:08 PM   CHOLHDL 4.3 10/08/2016 11:44 AM   LDLCALC 92 10/22/2017 04:08 PM    Wt Readings from Last 3 Encounters:  03/02/19 (!) 330 lb (149.7 kg)  02/23/19 (!) 330 lb (149.7 kg)  10/21/18 300 lb (136.1 kg)     Objective:    Vital Signs:  BP (!) 138/58 (BP Location: Left Arm, Patient Position: Sitting)   Pulse 75   Temp 97.6 F (36.4 C)   Ht 6' (1.829 m)   Wt (!) 330 lb (149.7 kg)   BMI 44.76 kg/m    VITAL SIGNS:  reviewed GEN:  no acute distress EYES:  sclerae anicteric, EOMI - Extraocular Movements Intact RESPIRATORY:  normal respiratory effort, symmetric expansion CARDIOVASCULAR:  no peripheral edema SKIN:  no rash, lesions or ulcers. MUSCULOSKELETAL:  no obvious deformities. NEURO:  alert and oriented x 3, no obvious focal deficit PSYCH:  normal affect  ASSESSMENT & PLAN:     1.  Heart failure chronic combined compensated New York Heart Association class I to class II continue his current diuretic and next office visit needs proBNP level renal function and echocardiogram performed.  He  will continue to weigh daily and sodium restrict. Hypertensive heart disease stable continue current treatment with loop and distal diuretic beta-blocker and ARB Atrial fibrillation, chronic stable rate controlled with beta-blocker continue along with his anticoagulant Chronic anticoagulation stable continue his anticoagulant he has had no bleeding complication Aortic stenosis stable asymptomatic he needs a follow-up echocardiogram scheduled at his next visit Bifascicular heart block stable asymptomatic needs an EKG at his next visit at risk for progression to high degree or complete heart block needing a pacemaker  COVID-19 Education: The signs and symptoms of COVID-19 were discussed with the patient and how to seek care for testing (follow up with PCP or arrange E-visit).  The importance of social distancing was discussed today.  Time:   Today, I have spent 25 minutes with the patient with telehealth technology discussing the above problems.     Medication Adjustments/Labs and Tests Ordered: Current medicines are reviewed at length with the patient today.  Concerns regarding medicines are outlined above.   Tests Ordered: No orders of the defined types were placed in this encounter.   Medication Changes: No orders of the defined types were placed in this encounter.    Disposition:  Follow up in 2 month(s)  Signed, Shirlee More, MD  03/02/2019 3:02 PM    [facility]

## 2019-03-02 ENCOUNTER — Encounter: Payer: Self-pay | Admitting: Cardiology

## 2019-03-02 ENCOUNTER — Telehealth (INDEPENDENT_AMBULATORY_CARE_PROVIDER_SITE_OTHER): Payer: Medicare Other | Admitting: Cardiology

## 2019-03-02 ENCOUNTER — Other Ambulatory Visit: Payer: Self-pay

## 2019-03-02 VITALS — BP 138/58 | HR 75 | Temp 97.6°F | Ht 72.0 in | Wt 330.0 lb

## 2019-03-02 DIAGNOSIS — I5042 Chronic combined systolic (congestive) and diastolic (congestive) heart failure: Secondary | ICD-10-CM

## 2019-03-02 DIAGNOSIS — I110 Hypertensive heart disease with heart failure: Secondary | ICD-10-CM

## 2019-03-02 DIAGNOSIS — I452 Bifascicular block: Secondary | ICD-10-CM

## 2019-03-02 DIAGNOSIS — Z7901 Long term (current) use of anticoagulants: Secondary | ICD-10-CM

## 2019-03-02 DIAGNOSIS — I482 Chronic atrial fibrillation, unspecified: Secondary | ICD-10-CM

## 2019-03-02 DIAGNOSIS — I350 Nonrheumatic aortic (valve) stenosis: Secondary | ICD-10-CM

## 2019-03-02 NOTE — Patient Instructions (Addendum)
Medication Instructions:  Your physician recommends that you continue on your current medications as directed. Please refer to the Current Medication list given to you today.  If you need a refill on your cardiac medications before your next appointment, please call your pharmacy.   Lab work: None  If you have labs (blood work) drawn today and your tests are completely normal, you will receive your results only by: Marland Kitchen MyChart Message (if you have MyChart) OR . A paper copy in the mail If you have any lab test that is abnormal or we need to change your treatment, we will call you to review the results.  Testing/Procedures: None  Follow-Up: At [facility], you and your health needs are our priority.  As part of our continuing mission to provide you with exceptional heart care, we have created designated Provider Care Teams.  These Care Teams include your primary Cardiologist (physician) and Advanced Practice Providers (APPs -  Physician Assistants and Nurse Practitioners) who all work together to provide you with the care you need, when you need it. You will need a follow up virtual appointment in 3 months: Tuesday, 06/01/2019, at 9:20 am.

## 2019-03-08 ENCOUNTER — Other Ambulatory Visit (INDEPENDENT_AMBULATORY_CARE_PROVIDER_SITE_OTHER): Payer: Medicare Other

## 2019-03-08 ENCOUNTER — Other Ambulatory Visit: Payer: Self-pay

## 2019-03-08 DIAGNOSIS — G4733 Obstructive sleep apnea (adult) (pediatric): Secondary | ICD-10-CM

## 2019-03-08 DIAGNOSIS — E66813 Obesity, class 3: Secondary | ICD-10-CM

## 2019-03-08 DIAGNOSIS — L84 Corns and callosities: Secondary | ICD-10-CM

## 2019-03-08 DIAGNOSIS — R269 Unspecified abnormalities of gait and mobility: Secondary | ICD-10-CM | POA: Diagnosis not present

## 2019-03-08 DIAGNOSIS — E559 Vitamin D deficiency, unspecified: Secondary | ICD-10-CM | POA: Diagnosis not present

## 2019-03-08 DIAGNOSIS — Z7901 Long term (current) use of anticoagulants: Secondary | ICD-10-CM

## 2019-03-08 DIAGNOSIS — R5383 Other fatigue: Secondary | ICD-10-CM | POA: Diagnosis not present

## 2019-03-08 DIAGNOSIS — R202 Paresthesia of skin: Secondary | ICD-10-CM | POA: Diagnosis not present

## 2019-03-08 DIAGNOSIS — I5042 Chronic combined systolic (congestive) and diastolic (congestive) heart failure: Secondary | ICD-10-CM

## 2019-03-08 DIAGNOSIS — I482 Chronic atrial fibrillation, unspecified: Secondary | ICD-10-CM | POA: Diagnosis not present

## 2019-03-08 DIAGNOSIS — M21371 Foot drop, right foot: Secondary | ICD-10-CM | POA: Diagnosis not present

## 2019-03-08 DIAGNOSIS — I272 Pulmonary hypertension, unspecified: Secondary | ICD-10-CM | POA: Diagnosis not present

## 2019-03-08 DIAGNOSIS — I10 Essential (primary) hypertension: Secondary | ICD-10-CM | POA: Diagnosis not present

## 2019-03-08 DIAGNOSIS — R809 Proteinuria, unspecified: Secondary | ICD-10-CM | POA: Diagnosis not present

## 2019-03-08 DIAGNOSIS — R7309 Other abnormal glucose: Secondary | ICD-10-CM | POA: Diagnosis not present

## 2019-03-08 DIAGNOSIS — M159 Polyosteoarthritis, unspecified: Secondary | ICD-10-CM

## 2019-03-09 ENCOUNTER — Encounter: Payer: Self-pay | Admitting: Family Medicine

## 2019-03-09 ENCOUNTER — Ambulatory Visit (INDEPENDENT_AMBULATORY_CARE_PROVIDER_SITE_OTHER): Payer: Medicare Other | Admitting: Family Medicine

## 2019-03-09 ENCOUNTER — Other Ambulatory Visit: Payer: Self-pay

## 2019-03-09 VITALS — BP 136/58 | HR 74 | Temp 97.0°F | Ht 72.0 in | Wt 330.0 lb

## 2019-03-09 DIAGNOSIS — M5417 Radiculopathy, lumbosacral region: Secondary | ICD-10-CM | POA: Diagnosis not present

## 2019-03-09 DIAGNOSIS — Z79899 Other long term (current) drug therapy: Secondary | ICD-10-CM | POA: Diagnosis not present

## 2019-03-09 DIAGNOSIS — E66813 Obesity, class 3: Secondary | ICD-10-CM

## 2019-03-09 DIAGNOSIS — I110 Hypertensive heart disease with heart failure: Secondary | ICD-10-CM

## 2019-03-09 DIAGNOSIS — M47816 Spondylosis without myelopathy or radiculopathy, lumbar region: Secondary | ICD-10-CM | POA: Diagnosis not present

## 2019-03-09 DIAGNOSIS — I272 Pulmonary hypertension, unspecified: Secondary | ICD-10-CM

## 2019-03-09 DIAGNOSIS — Z0000 Encounter for general adult medical examination without abnormal findings: Secondary | ICD-10-CM | POA: Diagnosis not present

## 2019-03-09 DIAGNOSIS — M5441 Lumbago with sciatica, right side: Secondary | ICD-10-CM | POA: Diagnosis not present

## 2019-03-09 DIAGNOSIS — Z23 Encounter for immunization: Secondary | ICD-10-CM

## 2019-03-09 LAB — CBC WITH DIFFERENTIAL/PLATELET
Basophils Absolute: 0.1 10*3/uL (ref 0.0–0.2)
Basos: 1 %
EOS (ABSOLUTE): 0.3 10*3/uL (ref 0.0–0.4)
Eos: 4 %
Hematocrit: 39.2 % (ref 37.5–51.0)
Hemoglobin: 13.2 g/dL (ref 13.0–17.7)
Immature Grans (Abs): 0 10*3/uL (ref 0.0–0.1)
Immature Granulocytes: 0 %
Lymphocytes Absolute: 1.5 10*3/uL (ref 0.7–3.1)
Lymphs: 23 %
MCH: 29.9 pg (ref 26.6–33.0)
MCHC: 33.7 g/dL (ref 31.5–35.7)
MCV: 89 fL (ref 79–97)
Monocytes Absolute: 0.6 10*3/uL (ref 0.1–0.9)
Monocytes: 9 %
Neutrophils Absolute: 4.1 10*3/uL (ref 1.4–7.0)
Neutrophils: 63 %
Platelets: 204 10*3/uL (ref 150–450)
RBC: 4.42 x10E6/uL (ref 4.14–5.80)
RDW: 13 % (ref 11.6–15.4)
WBC: 6.6 10*3/uL (ref 3.4–10.8)

## 2019-03-09 LAB — LIPID PANEL
Chol/HDL Ratio: 4.9 ratio (ref 0.0–5.0)
Cholesterol, Total: 178 mg/dL (ref 100–199)
HDL: 36 mg/dL — ABNORMAL LOW (ref >39)
LDL Calculated: 92 mg/dL (ref 0–99)
Triglycerides: 251 mg/dL — ABNORMAL HIGH (ref 0–149)
VLDL Cholesterol Cal: 50 mg/dL — ABNORMAL HIGH (ref 5–40)

## 2019-03-09 LAB — COMPREHENSIVE METABOLIC PANEL
ALT: 24 IU/L (ref 0–44)
AST: 23 IU/L (ref 0–40)
Albumin/Globulin Ratio: 1.6 (ref 1.2–2.2)
Albumin: 4.7 g/dL (ref 3.8–4.8)
Alkaline Phosphatase: 85 IU/L (ref 39–117)
BUN/Creatinine Ratio: 15 (ref 10–24)
BUN: 21 mg/dL (ref 8–27)
Bilirubin Total: 0.6 mg/dL (ref 0.0–1.2)
CO2: 24 mmol/L (ref 20–29)
Calcium: 9.3 mg/dL (ref 8.6–10.2)
Chloride: 101 mmol/L (ref 96–106)
Creatinine, Ser: 1.38 mg/dL — ABNORMAL HIGH (ref 0.76–1.27)
GFR calc Af Amer: 61 mL/min/{1.73_m2} (ref >59)
GFR calc non Af Amer: 53 mL/min/{1.73_m2} — ABNORMAL LOW (ref >59)
Globulin, Total: 2.9 g/dL (ref 1.5–4.5)
Glucose: 120 mg/dL — ABNORMAL HIGH (ref 65–99)
Potassium: 4.2 mmol/L (ref 3.5–5.2)
Sodium: 139 mmol/L (ref 134–144)
Total Protein: 7.6 g/dL (ref 6.0–8.5)

## 2019-03-09 LAB — HEMOGLOBIN A1C
Est. average glucose Bld gHb Est-mCnc: 117 mg/dL
Hgb A1c MFr Bld: 5.7 % — ABNORMAL HIGH (ref 4.8–5.6)

## 2019-03-09 LAB — TSH: TSH: 0.8 u[IU]/mL (ref 0.450–4.500)

## 2019-03-09 LAB — T4, FREE: Free T4: 1.05 ng/dL (ref 0.82–1.77)

## 2019-03-09 LAB — VITAMIN B12: Vitamin B-12: 699 pg/mL (ref 232–1245)

## 2019-03-09 LAB — VITAMIN D 25 HYDROXY (VIT D DEFICIENCY, FRACTURES): Vit D, 25-Hydroxy: 43.6 ng/mL (ref 30.0–100.0)

## 2019-03-09 MED ORDER — TETANUS-DIPHTH-ACELL PERTUSSIS 5-2.5-18.5 LF-MCG/0.5 IM SUSP: 0.5000 mL | Freq: Once | INTRAMUSCULAR | 0 refills | Status: AC

## 2019-03-09 MED ORDER — ZOSTER VAC RECOMB ADJUVANTED 50 MCG/0.5ML IM SUSR: 0.5000 mL | Freq: Once | INTRAMUSCULAR | 0 refills | Status: AC

## 2019-03-09 NOTE — Progress Notes (Addendum)
Telehealth office visit note for Joel Dominguez, Joel Dominguez- at Primary Care at [facility]   I connected with current patient today and verified that I am speaking with the correct person using two identifiers.   . Location of the patient: Home . Location of the provider: Office Only the patient (+/- their family members at pt's discretion) and myself were participating in the encounter    - This visit type was conducted due to national recommendations for restrictions regarding the COVID-19 Pandemic (e.g. social distancing) in an effort to limit this patient's exposure and mitigate transmission in our community.  This format is felt to be most appropriate for this patient at this time.   - The patient did have access to video technology  - No physical exam could be performed with this format, beyond that communicated to [place] by the patient/ family members as noted.   - Additionally my office staff/ schedulers discussed with the patient that there may be a monetary charge related to this service, depending on their medical insurance.   The patient expressed understanding, and agreed to proceed.    Subjective:   Joel Dominguez is a 68 y.o. male who presents for Medicare Annual/Subsequent preventive examination.  08/2014- pt was told colonoscopy was clear and to repeat in 10 yrs.  -never had new shingrix vaccine.    -Reviewed recent labs with patient today and he denies being on any NSAIDs-since his serum creatinine did bump a little bit.  Explained to him we should recheck this in 3 months.  Discussed with patient proper hydration, avoiding nephrotoxic substances and controlling blood pressure well etc.       Objective:    Vitals: BP (!) 136/58   Pulse 74   Temp (!) 97 F (36.1 C)   Ht 6' (1.829 m)   Wt (!) 330 lb (149.7 kg)   BMI 44.76 kg/m   Body mass index is 44.76 kg/m.   Advanced Directives 10/08/2016  Does Patient Have a Medical Advance Directive? Yes  Type of [employer];Living will    Tobacco Social History   Tobacco Use  Smoking Status Never Smoker  Smokeless Tobacco Never Used     Counseling given: Not Answered   Past Medical History:  Diagnosis Date  . A-fib- couple yrs now. 10/08/2016  . Aortic stenosis 11/06/2017  . Atherosclerotic heart disease of native coronary artery with unspecified angina pectoris ([place]) 10/08/2016  . CHF (congestive heart failure) ([place])   . Chronic anticoagulation 10/08/2016  . Foot drop- R 10/08/2016   Patient known foot drop for many years due to lumbar spinal stenosis.-Over the past couple of months symptoms have gotten worse.  He feels he is tripping more and cannot lift his foot up as much. Toes are curling and calluses on end of toes.    - Did see spinal surgeon many yrs ago.  Declined sx at that time.     . Generalized OA 10/08/2016   Ortho doc-  Dr. [facility] in [place]- both knees replaced 08, 09.     . H/O noncompliance with medical treatment, presenting hazards to health 10/22/2017  . Heart disease   . Hypertension   . Obesity, Class III, BMI 40-49.9 (morbid obesity) ([place]) 10/08/2016  . OSA (obstructive sleep apnea) 10/08/2016   Bi-PAP nitely- been 20+ yrs now  . Pulmonary hypertension ([place]) 10/08/2016  . Spinal stenosis, lumbar region, with neurogenic claudication 10/08/2016   Back specialist in [place]-  only surgeon he would use- pt declined referral here for back pain mgt    . Stroke [facility])      Past Surgical History:  Procedure Laterality Date  . REPLACEMENT TOTAL KNEE Bilateral   . TIBIA FRACTURE SURGERY       Family History  Problem Relation Age of Onset  . Congestive Heart Failure Mother   . Hypertension Mother   . Diabetes Mother   . Cancer Father        lung  . Congestive Heart Failure Sister   . Hypertension Sister   . Hypertension Brother   . Hypertension Sister   . Diabetes Brother      Social History   Socioeconomic History  . Marital status: Married     Spouse name: Not on file  . Number of children: Not on file  . Years of education: Not on file  . Highest education level: Not on file  Occupational History  . Not on file  Social Needs  . Financial resource strain: Not on file  . Food insecurity:    Worry: Not on file    Inability: Not on file  . Transportation needs:    Medical: Not on file    Non-medical: Not on file  Tobacco Use  . Smoking status: Never Smoker  . Smokeless tobacco: Never Used  Substance and Sexual Activity  . Alcohol use: Yes    Comment: once monthly  . Drug use: No  . Sexual activity: Never  Lifestyle  . Physical activity:    Days per week: Not on file    Minutes per session: Not on file  . Stress: Not on file  Relationships  . Social connections:    Talks on phone: Not on file    Gets together: Not on file    Attends religious service: Not on file    Active member of club or organization: Not on file    Attends meetings of clubs or organizations: Not on file    Relationship status: Not on file  Other Topics Concern  . Not on file  Social History Narrative  . Not on file     Outpatient Encounter Medications as of 03/09/2019  Medication Sig  . bumetanide (BUMEX) 2 MG tablet TAKE 1 TABLET BY MOUTH 2 TIMES DAILY.  . carvedilol (COREG) 25 MG tablet TAKE 1 TABLET BY MOUTH 2 TIMES DAILY WITH A MEAL.  Marland Kitchen ELIQUIS 5 MG TABS tablet TAKE 1 TABLET BY MOUTH 2 TIMES DAILY.  Marland Kitchen HYDROcodone-acetaminophen (NORCO) 10-325 MG tablet Take 1 tablet by mouth as needed.  . Multiple Vitamin (MULTIVITAMIN) tablet Take 1 tablet by mouth daily.  . potassium chloride (K-DUR) 10 MEQ tablet TAKE 2 TABLETS BY MOUTH 2 TIMES DAILY.  Marland Kitchen spironolactone (ALDACTONE) 25 MG tablet TAKE 1 TABLET BY MOUTH DAILY.  Marland Kitchen telmisartan (MICARDIS) 80 MG tablet TAKE 1 TABLET BY MOUTH DAILY.  Marland Kitchen Vitamin D, Ergocalciferol, (DRISDOL) 50000 units CAPS capsule Take 1 capsule (50,000 Units total) by mouth every 7 (seven) days.  . Tdap (BOOSTRIX) 5-2.5-18.5  LF-MCG/0.5 injection Inject 0.5 mLs into the muscle once for 1 dose.  Marland Kitchen Zoster Vaccine Adjuvanted [facility]) injection Inject 0.5 mLs into the muscle once for 1 dose.   No facility-administered encounter medications on file as of 03/09/2019.      Activities of Daily Living In your present state of health, do you have any difficulty performing the following activities: 03/09/2019  Hearing? N  Vision? N  Difficulty concentrating  or making decisions? N  Walking or climbing stairs? Y  Dressing or bathing? N  Doing errands, shopping? N  Some recent data might be hidden     Activities of Daily Living In your present state of health, do you have difficulty performing the following activities?  1- Driving - no 2- Managing money - no 3- Feeding yourself - no 4- Getting from the bed to the chair - no 5- Climbing a flight of stairs - yes 6- Preparing food and eating - no 7- Bathing or showering - no 8- Getting dressed - no 9- Getting to the toilet - no 10- Using the toilet - no 11- Moving around from place to place - no  Patient states that he feels safe at home   Patient Care Team: Joel Dance, DO as PCP - General (Family Medicine) Lucia Bitter., MD as Physician Assistant (Pain Medicine) Richardo Priest, MD as Consulting Physician (Cardiology) Edrick Kins, DPM as Consulting Physician (Podiatry)     Assessment:   This is a routine wellness examination for Joel Dominguez.  Exercise Activities and Dietary recommendations Current Exercise Habits: The patient does not participate in regular exercise at present(Patient does work around the house)   Goals   None     Fall Risk Fall Risk  03/09/2019 02/23/2019 04/30/2018 02/17/2018 10/22/2017  Falls in the past year? 0 0 No No No  Number falls in past yr: - - - - -  Injury with Fall? - - - - -  Risk for fall due to : Impaired mobility;Impaired balance/gait - - - -  Follow up - Falls evaluation completed - - -    Is the  patient's home free of loose throw rugs in walkways, pet beds, electrical cords, etc?   no      Grab bars in the bathroom? yes      Handrails on the stairs?   yes      Adequate lighting?   yes  Timed Get Up and Go Performed: unable to perform  Depression Screen PHQ 2/9 Scores 03/09/2019 02/23/2019 04/30/2018 02/17/2018  PHQ - 2 Score 0 0 0 0  PHQ- 9 Score 3 0 0 4   Depression screen [facility] 2/9 03/09/2019 02/23/2019 04/30/2018 02/17/2018 02/17/2018  Decreased Interest 0 0 0 0 0  Down, Depressed, Hopeless 0 0 0 0 0  PHQ - 2 Score 0 0 0 0 0  Altered sleeping 0 0 0 1 -  Tired, decreased energy 3 0 0 2 -  Change in appetite 0 0 0 0 -  Feeling bad or failure about yourself  0 0 0 0 -  Trouble concentrating 0 0 0 0 -  Moving slowly or fidgety/restless 0 0 0 1 -  Suicidal thoughts 0 0 0 0 -  PHQ-9 Score 3 0 0 4 -  Difficult doing work/chores Not difficult at all Not difficult at all Not difficult at all Not difficult at all -    GAD 7 : Generalized Anxiety Score 03/09/2019  Nervous, Anxious, on Edge 0  Control/stop worrying 0  Worry too much - different things 0  Trouble relaxing 0  Restless 0  Easily annoyed or irritable 1  Afraid - awful might happen 0  Total GAD 7 Score 1  Anxiety Difficulty Not difficult at all    Cognitive Function 6CIT Screen 03/09/2019  What Year? 0 points  What month? 0 points  What time? 0 points  Count back from 20 0 points  Months in reverse 0 points  Repeat phrase 4 points  Total Score 4    Immunization History  Administered Date(s) Administered  . Influenza, High Dose Seasonal PF 10/08/2016, 09/18/2018  . Influenza-Unspecified 09/18/2018  . Pneumococcal Conjugate-13 05/13/2014  . Pneumococcal Polysaccharide-23 03/11/2007, 08/04/2010    Qualifies for Shingles Vaccine? Sent in to the pharmacy.   Screening Tests Health Maintenance  Topic Date Due  . TETANUS/TDAP  02/23/2020 (Originally 05/06/1970)  . Hepatitis C Screening  02/23/2020 (Originally  Nov 29, 1950)  . PNA vac Low Risk Adult (2 of 2 - PPSV23) 02/23/2020 (Originally 05/06/2016)  . INFLUENZA VACCINE  06/05/2019  . COLONOSCOPY  10/23/2027    Cancer Screenings: Lung: Low Dose CT Chest recommended if Age 72-80 years, 30 pack-year currently smoking OR have quit w/in 15years. Patient does not qualify.   ---> Has never smoked in his life  Colorectal: 10/ 2015       Plan:      Reviewed recent labs with patient today and he denies being on any NSAIDs-since his serum creatinine did bump a little bit.  Explained to him we should recheck this in 3 months.  Discussed with patient proper hydration, avoiding nephrotoxic substances and controlling blood pressure well etc.  --> worked in [place] all his life- "hears the sea in his ears at all times".  Patient declines referral for hearing eval at this time.  I also told him he can call his insurance and just go somewhere such as [employer] which tends to be the cheapest for hearing aids etc.  He tells me it is not that bad today  -Tetanus and shingles vaccines sent into pharmacy. -We will need to obtain patient's colonoscopy report for his medical records. -Encouraged exercise cardio to a goal of 15 to 30 minutes daily.  This can be difficult for patient due to his dropfoot and physical disabilities.  I have personally reviewed and noted the following in the patient's chart:   . Medical and social history . Use of alcohol, tobacco or illicit drugs  . Current medications and supplements . Functional ability and status . Nutritional status . Physical activity . Advanced directives . List of other physicians . Hospitalizations, surgeries, and ER visits in previous 12 months . Vitals . Screenings to include cognitive, depression, and falls . Referrals and appointments  In addition, I have reviewed and discussed with patient certain preventive protocols, quality metrics, and best practice recommendations. A written personalized care  plan for preventive services as well as general preventive health recommendations were provided to patient.   Return for Office visit 3 months-serum creatinine needs to be rechecked; BP etc.  Joel Dance, DO  03/09/2019

## 2019-03-15 ENCOUNTER — Encounter: Payer: Self-pay | Admitting: Family Medicine

## 2019-03-16 ENCOUNTER — Other Ambulatory Visit: Payer: Self-pay

## 2019-03-16 DIAGNOSIS — M5417 Radiculopathy, lumbosacral region: Secondary | ICD-10-CM

## 2019-03-16 MED ORDER — CYCLOBENZAPRINE HCL 5 MG PO TABS: ORAL_TABLET | ORAL | 0 refills | Status: DC

## 2019-03-16 NOTE — Telephone Encounter (Signed)
Please advise. MPulliam, CMA/RT(R)  

## 2019-03-16 NOTE — Progress Notes (Signed)
Per Dr. Raliegh Scarlet sent medication into the pharmacy.  Patient notified.  Please see MyChart message in the chart. MPulliam, CMA/RT(R)

## 2019-03-19 ENCOUNTER — Other Ambulatory Visit: Payer: Self-pay | Admitting: Family Medicine

## 2019-03-19 DIAGNOSIS — E559 Vitamin D deficiency, unspecified: Secondary | ICD-10-CM

## 2019-03-23 DIAGNOSIS — Z20828 Contact with and (suspected) exposure to other viral communicable diseases: Secondary | ICD-10-CM | POA: Diagnosis not present

## 2019-03-25 ENCOUNTER — Other Ambulatory Visit: Payer: Self-pay | Admitting: Family Medicine

## 2019-03-25 ENCOUNTER — Other Ambulatory Visit: Payer: Self-pay | Admitting: Cardiology

## 2019-03-25 DIAGNOSIS — I10 Essential (primary) hypertension: Secondary | ICD-10-CM

## 2019-03-26 ENCOUNTER — Other Ambulatory Visit: Payer: Self-pay | Admitting: Cardiology

## 2019-03-26 NOTE — Telephone Encounter (Signed)
Received pharmacy refill request for KCL 10 meq BID. New rx Spironolactone 25 mg QD ordered yesterday per patient's DO. Received flag prescribing drug interaction alert for these meds together.   pls advise if ok to refill KCL with Spironolactone

## 2019-03-26 NOTE — Telephone Encounter (Signed)
Lets discontinue with addition of spironolactone

## 2019-03-27 ENCOUNTER — Other Ambulatory Visit: Payer: Self-pay | Admitting: Cardiology

## 2019-03-30 ENCOUNTER — Telehealth: Payer: Self-pay | Admitting: Cardiology

## 2019-03-30 ENCOUNTER — Other Ambulatory Visit: Payer: Self-pay

## 2019-03-30 NOTE — Telephone Encounter (Signed)
Patient reports that he is taking potassium chloride 20 mEq twice daily and has been for years. This medication is not on patient's medication list. Patient states that he is out of this medication and needs a refill. Please advise if it is okay to refill. Thanks!

## 2019-03-30 NOTE — Telephone Encounter (Signed)
Has questions about his medications being changed

## 2019-03-30 NOTE — Telephone Encounter (Signed)
I would continue his potassium and add to med list

## 2019-03-31 ENCOUNTER — Telehealth: Payer: Self-pay | Admitting: Family Medicine

## 2019-03-31 NOTE — Telephone Encounter (Signed)
Cards stopped Potassium due to an interaction with the Spirolactone.  Patient states that he has been on both for years and wants to know if you think the Potassium is the best one to stop. MPulliam, CMA/RT(R)

## 2019-03-31 NOTE — Telephone Encounter (Signed)
Joel Dominguez, I already spoke to the patient this evening around 7:45 PM-I called him on his home phone.     He told me cardiology told him to stop his potassium..  Since then I have reached out to Dr. Bettina Gavia his cardiologis to clarify this.  I am waiting to hear from Dr. Bettina Gavia in response.    Lastly I told patient (as well as Dr. Bettina Gavia to avoid this confusion/and to avoid a potential medication error in the future) it would be best for patient to get his spironolactone from his cardiologist since he gets all the rest of his diuretics and such from them.  -I emailed his cardiologist at around 8 PM last night /this evening so please let patient know we will either call him back with the information or his cardiologist office will clarify the change with the patient since they told him to make it in the first place.

## 2019-03-31 NOTE — Telephone Encounter (Signed)
Please, call and inform the patient of this. ( my prior documentation that I was "timed out of")  Also, I will forward this to Dr. Bettina Gavia patient's cardiologist, in order to keep him in the loop and notify him that patient will be getting spironolactone from them in the future to avoid any medication errors or confusion in the future.

## 2019-03-31 NOTE — Telephone Encounter (Signed)
Informed Dr. Bettina Gavia that patient is currently on spironolactone, bumex and potassium chloride. Received alert when re-filling potassium for potential interaction between spironolactone and potassium.    Informed Dr. Bettina Gavia that alert received as above. Orders received to dc potassium.   Called patient and informed of this. He verbalizes understanding, no further questions/concerns verbalized.

## 2019-03-31 NOTE — Telephone Encounter (Signed)
[name] can you please call the patient and see what changes have been made and what his concerns are.  thank you.   If need be, I can call tomorrow night after clinic but it may be late as I am usually not done with the day till about 630 or 7

## 2019-03-31 NOTE — Telephone Encounter (Signed)
Patient would like for you to call him regarding a medication change.  His cardiologist stopped his potassium and patient is concerned about this.  Please call him.

## 2019-04-01 MED ORDER — POTASSIUM CHLORIDE ER 10 MEQ PO TBCR: 20.0000 meq | EXTENDED_RELEASE_TABLET | Freq: Two times a day (BID) | ORAL | 1 refills | Status: DC

## 2019-04-01 NOTE — Telephone Encounter (Signed)
Noted MPulliam, CMA/RT(R)  

## 2019-04-01 NOTE — Addendum Note (Signed)
Addended by: Polly Cobia A on: 04/01/2019 12:31 PM   Modules accepted: Orders

## 2019-04-01 NOTE — Telephone Encounter (Signed)
K-DUR restarted 20 mg BID per Dr. Bettina Gavia. Called patient and informed of re-order, rx  sent to [place] per pt request.

## 2019-04-07 ENCOUNTER — Ambulatory Visit: Payer: Medicare Other | Admitting: Podiatry

## 2019-04-13 ENCOUNTER — Telehealth: Payer: Self-pay

## 2019-04-13 MED ORDER — APIXABAN 5 MG PO TABS: 5.0000 mg | ORAL_TABLET | Freq: Two times a day (BID) | ORAL | 3 refills | Status: DC

## 2019-04-13 NOTE — Addendum Note (Signed)
Addended by: Stevan Born on: 04/13/2019 02:27 PM   Modules accepted: Orders

## 2019-04-13 NOTE — Telephone Encounter (Signed)
Patient called requesting paper work for Eliquis Patient Assistance to be completed by Dr Bettina Gavia.  Nevin Bloodgood made patient aware Dr Bettina Gavia is in [facility] office.  Patient agreed to have Dr Bettina Gavia complete our portion of paper work, and be faxed to [facility] office for pick up.  Paper work completed by Dr Bettina Gavia, and sent to [facility].

## 2019-04-15 ENCOUNTER — Emergency Department (HOSPITAL_BASED_OUTPATIENT_CLINIC_OR_DEPARTMENT_OTHER): Payer: Medicare Other

## 2019-04-15 ENCOUNTER — Encounter (HOSPITAL_BASED_OUTPATIENT_CLINIC_OR_DEPARTMENT_OTHER): Payer: Self-pay | Admitting: Emergency Medicine

## 2019-04-15 ENCOUNTER — Emergency Department (HOSPITAL_BASED_OUTPATIENT_CLINIC_OR_DEPARTMENT_OTHER)
Admission: EM | Admit: 2019-04-15 | Discharge: 2019-04-15 | Disposition: A | Discharge status: Home / Self Care | Payer: Medicare Other | Attending: Emergency Medicine | Admitting: Emergency Medicine

## 2019-04-15 ENCOUNTER — Other Ambulatory Visit: Payer: Self-pay

## 2019-04-15 DIAGNOSIS — I110 Hypertensive heart disease with heart failure: Secondary | ICD-10-CM | POA: Insufficient documentation

## 2019-04-15 DIAGNOSIS — I509 Heart failure, unspecified: Secondary | ICD-10-CM | POA: Diagnosis not present

## 2019-04-15 DIAGNOSIS — I259 Chronic ischemic heart disease, unspecified: Secondary | ICD-10-CM | POA: Diagnosis not present

## 2019-04-15 DIAGNOSIS — I4891 Unspecified atrial fibrillation: Secondary | ICD-10-CM | POA: Insufficient documentation

## 2019-04-15 DIAGNOSIS — M25512 Pain in left shoulder: Secondary | ICD-10-CM | POA: Insufficient documentation

## 2019-04-15 DIAGNOSIS — M25519 Pain in unspecified shoulder: Secondary | ICD-10-CM

## 2019-04-15 DIAGNOSIS — I4510 Unspecified right bundle-branch block: Secondary | ICD-10-CM | POA: Diagnosis not present

## 2019-04-15 DIAGNOSIS — R079 Chest pain, unspecified: Secondary | ICD-10-CM | POA: Diagnosis not present

## 2019-04-15 LAB — BASIC METABOLIC PANEL
Anion gap: 7 (ref 5–15)
BUN: 27 mg/dL — ABNORMAL HIGH (ref 8–23)
CO2: 27 mmol/L (ref 22–32)
Calcium: 8.8 mg/dL — ABNORMAL LOW (ref 8.9–10.3)
Chloride: 103 mmol/L (ref 98–111)
Creatinine, Ser: 1.42 mg/dL — ABNORMAL HIGH (ref 0.61–1.24)
GFR calc Af Amer: 59 mL/min — ABNORMAL LOW (ref >60)
GFR calc non Af Amer: 51 mL/min — ABNORMAL LOW (ref >60)
Glucose, Bld: 123 mg/dL — ABNORMAL HIGH (ref 70–99)
Potassium: 4.3 mmol/L (ref 3.5–5.1)
Sodium: 137 mmol/L (ref 135–145)

## 2019-04-15 LAB — CBC
HCT: 41.6 % (ref 39.0–52.0)
Hemoglobin: 13.5 g/dL (ref 13.0–17.0)
MCH: 29.7 pg (ref 26.0–34.0)
MCHC: 32.5 g/dL (ref 30.0–36.0)
MCV: 91.4 fL (ref 80.0–100.0)
Platelets: 220 10*3/uL (ref 150–400)
RBC: 4.55 MIL/uL (ref 4.22–5.81)
RDW: 13.5 % (ref 11.5–15.5)
WBC: 6.6 10*3/uL (ref 4.0–10.5)
nRBC: 0 % (ref 0.0–0.2)

## 2019-04-15 LAB — TROPONIN I: Troponin I: <0.03 ng/mL (ref <0.03)

## 2019-04-15 MED ORDER — LIDOCAINE 5 % EX PTCH: 1.0000 | MEDICATED_PATCH | CUTANEOUS | 0 refills | Status: DC

## 2019-04-15 NOTE — ED Triage Notes (Signed)
Pt reports non-traumatic left shoulder pain. 7/10 x 3 weeks. Radiating to neck, deneis chest pain , chronic shortness of breath, Hx A fib, CHF. Denies NV.

## 2019-04-15 NOTE — Discharge Instructions (Addendum)
You were evaluated in the Emergency Department and after careful evaluation, we did not find any emergent condition requiring admission or further testing in the hospital.  Your symptoms today seem to be due to arthritis in the shoulder.  Your testing today was overall reassuring.  Please follow-up with your primary care doctor as well as the orthopedic specialists, call the number provided to set up an appointment.  Please return to the Emergency Department if you experience any worsening of your condition.  We encourage you to follow up with a primary care provider.  Thank you for allowing [place] to be a part of your care.

## 2019-04-15 NOTE — ED Provider Notes (Signed)
[facility] Emergency Department Provider Note MRN:  [id number]  Arrival date & time: 04/15/19     Chief Complaint   Shoulder Pain (left)   History of Present Illness   Joel Dominguez is a 68 y.o. year-old male with a history of A. fib, heart failure, CAD presenting to the ED with chief complaint of shoulder pain.  Pain is located in the left shoulder, pain is been present for 3 weeks, constant, worse with motion of the left shoulder.  Denies chest pain, no shortness of breath, no dizziness, no diaphoresis, no nausea, no vomiting.  Denies headache or vision change, no abdominal pain, no numbness or weakness to the arms or legs.  Pain in the shoulder at times radiates to the left side of the neck and down the shoulder to the left elbow.  Review of Systems  A complete 10 system review of systems was obtained and all systems are negative except as noted in the HPI and PMH.   Patient's Health History    Past Medical History:  Diagnosis Date  . A-fib- couple yrs now. 10/08/2016  . Aortic stenosis 11/06/2017  . Atherosclerotic heart disease of native coronary artery with unspecified angina pectoris ([place]) 10/08/2016  . CHF (congestive heart failure) ([place])   . Chronic anticoagulation 10/08/2016  . Foot drop- R 10/08/2016   Patient known foot drop for many years due to lumbar spinal stenosis.-Over the past couple of months symptoms have gotten worse.  He feels he is tripping more and cannot lift his foot up as much. Toes are curling and calluses on end of toes.    - Did see spinal surgeon many yrs ago.  Declined sx at that time.     . Generalized OA 10/08/2016   Ortho doc-  Dr. [facility] in [place]- both knees replaced 08, 09.     . H/O noncompliance with medical treatment, presenting hazards to health 10/22/2017  . Heart disease   . Hypertension   . Obesity, Class III, BMI 40-49.9 (morbid obesity) ([place]) 10/08/2016  . OSA (obstructive sleep apnea) 10/08/2016   Bi-PAP  nitely- been 20+ yrs now  . Pulmonary hypertension ([place]) 10/08/2016  . Spinal stenosis, lumbar region, with neurogenic claudication 10/08/2016   Back specialist in [place]-    only surgeon he would use- pt declined referral here for back pain mgt    . Stroke [facility])     Past Surgical History:  Procedure Laterality Date  . REPLACEMENT TOTAL KNEE Bilateral   . TIBIA FRACTURE SURGERY      Family History  Problem Relation Age of Onset  . Congestive Heart Failure Mother   . Hypertension Mother   . Diabetes Mother   . Cancer Father        lung  . Congestive Heart Failure Sister   . Hypertension Sister   . Hypertension Brother   . Hypertension Sister   . Diabetes Brother     Social History   Socioeconomic History  . Marital status: Married    Spouse name: Not on file  . Number of children: Not on file  . Years of education: Not on file  . Highest education level: Not on file  Occupational History  . Not on file  Social Needs  . Financial resource strain: Not on file  . Food insecurity    Worry: Not on file    Inability: Not on file  . Transportation needs    Medical: Not on file  Non-medical: Not on file  Tobacco Use  . Smoking status: Never Smoker  . Smokeless tobacco: Never Used  Substance and Sexual Activity  . Alcohol use: Yes    Comment: once monthly  . Drug use: No  . Sexual activity: Never  Lifestyle  . Physical activity    Days per week: Not on file    Minutes per session: Not on file  . Stress: Not on file  Relationships  . Social Herbalist on phone: Not on file    Gets together: Not on file    Attends religious service: Not on file    Active member of club or organization: Not on file    Attends meetings of clubs or organizations: Not on file    Relationship status: Not on file  . Intimate partner violence    Fear of current or ex partner: Not on file    Emotionally abused: Not on file    Physically abused: Not on file    Forced sexual  activity: Not on file  Other Topics Concern  . Not on file  Social History Narrative  . Not on file     Physical Exam  Vital Signs and Nursing Notes reviewed Vitals:   04/15/19 1013  BP: (!) 163/90  Pulse: 79  Resp: 18  Temp: 97.9 F (36.6 C)  SpO2: 100%    CONSTITUTIONAL: Chronically ill-appearing, NAD NEURO:  Alert and oriented x 3, no focal deficits EYES:  eyes equal and reactive ENT/NECK:  no LAD, no JVD CARDIO: Regular rate, well-perfused, normal S1 and S2 PULM:  CTAB no wheezing or rhonchi GI/GU:  normal bowel sounds, non-distended, non-tender MSK/SPINE:  No gross deformities, no edema; preserved range of motion of the left shoulder but motion does reproduce patient's pain SKIN:  no rash, atraumatic PSYCH:  Appropriate speech and behavior  Diagnostic and Interventional Summary    EKG Interpretation  Date/Time:  Thursday April 15 2019 10:09:48 EDT Ventricular Rate:  75 PR Interval:    QRS Duration: 162 QT Interval:  440 QTC Calculation: 492 R Axis:   -55 Text Interpretation:  Atrial fibrillation RBBB and LAFB Probable left ventricular hypertrophy Confirmed by Gerlene Fee [phone]) on 04/15/2019 10:13:28 AM      Labs Reviewed  BASIC METABOLIC PANEL - Abnormal; Notable for the following components:      Result Value   Glucose, Bld 123 (*)    BUN 27 (*)    Creatinine, Ser 1.42 (*)    Calcium 8.8 (*)    GFR calc non Af Amer 51 (*)    GFR calc Af Amer 59 (*)    All other components within normal limits  CBC  TROPONIN I    DG Chest 2 View  Final Result    DG Shoulder Left  Final Result      Medications - No data to display   Procedures Critical Care  ED Course and Medical Decision Making  I have reviewed the triage vital signs and the nursing notes.  Pertinent labs & imaging results that were available during my care of the patient were reviewed by me and considered in my medical decision making (see below for details).  Suspect arthritis in this  68 year old male with 3 weeks of left shoulder pain.  Important to consider ACS or cardiac etiology given patient's age, risk factors and the left-sided nature of the shoulder pain with radiation to the neck.  However patient has no chest pain, no  associated symptoms, EKG is without ischemic features.  Troponin is pending.  Pain has been constant for 3 weeks, no indication for second troponin.  If negative, patient can be referred to orthopedics.  No increased warmth, no erythema, preserved range of motion, nothing to suggest infection or septic joint or DVT.  Labs are overall reassuring, troponin negative.  Minimally elevated creatinine today compared to prior, patient advised PCP follow-up.  Referred to orthopedic surgery for likely arthritis related pain.  After the discussed management above, the patient was determined to be safe for discharge.  The patient was in agreement with this plan and all questions regarding their care were answered.  ED return precautions were discussed and the patient will return to the ED with any significant worsening of condition.  Barth Kirks. Sedonia Small, [place] [email or web address]  Final Clinical Impressions(s) / ED Diagnoses     ICD-10-CM   1. Shoulder pain  M25.519 DG Chest 2 View    DG Chest 2 View    ED Discharge Orders         Ordered    lidocaine (LIDODERM) 5 %  Every 24 hours     04/15/19 1125             Maudie Flakes, MD 04/15/19 1127

## 2019-04-16 NOTE — Telephone Encounter (Signed)
Spoke with Lattie Haw trying to locate patient assistance paperwork as she states papers are not in the [place] office. Re-faxed paperwork to [facility] and called patient and his wife to let them know they can pick up this paperwork to complete and mail off today between 4-4:30 pm. Patient verbalized understanding and is agreeable. No further questions.

## 2019-04-18 ENCOUNTER — Encounter: Payer: Self-pay | Admitting: Family Medicine

## 2019-04-30 ENCOUNTER — Ambulatory Visit (INDEPENDENT_AMBULATORY_CARE_PROVIDER_SITE_OTHER): Payer: Medicare Other | Admitting: Podiatry

## 2019-04-30 ENCOUNTER — Other Ambulatory Visit: Payer: Self-pay

## 2019-04-30 ENCOUNTER — Encounter: Payer: Self-pay | Admitting: Podiatry

## 2019-04-30 DIAGNOSIS — D689 Coagulation defect, unspecified: Secondary | ICD-10-CM

## 2019-04-30 DIAGNOSIS — M79674 Pain in right toe(s): Secondary | ICD-10-CM | POA: Insufficient documentation

## 2019-04-30 DIAGNOSIS — B351 Tinea unguium: Secondary | ICD-10-CM | POA: Insufficient documentation

## 2019-04-30 DIAGNOSIS — M79675 Pain in left toe(s): Secondary | ICD-10-CM

## 2019-04-30 HISTORY — DX: Coagulation defect, unspecified: D68.9

## 2019-04-30 NOTE — Progress Notes (Signed)
Complaint:  Visit Type: Patient returns to my office for continued preventative foot care services. Complaint: Patient states" my nails have grown long and thick and become painful to walk and wear shoes" Patient has healed second toe right foot. The patient presents for preventative foot care services. No changes to ROS  Podiatric Exam: Vascular: dorsalis pedis and posterior tibial pulses are palpable bilateral. Capillary return is immediate. Temperature gradient is WNL. Skin turgor WNL Venous insufficiency. Sensorium: Diminished  Semmes Weinstein monofilament test. Normal tactile sensation bilaterally. Nail Exam: Pt has thick disfigured discolored nails with subungual debris noted bilateral entire nail hallux through fifth toenails Ulcer Exam: There is no evidence of ulcer or pre-ulcerative changes or infection. Orthopedic Exam: Muscle tone and strength are WNL. No limitations in general ROM. No crepitus or effusions noted. Foot drop right foot.  Hammer toe second right foot. Skin: No Porokeratosis. No infection or ulcers.  Healed ulcer second toe right foot  Diagnosis:  Onychomycosis, , Pain in right toe, pain in left toes  Treatment & Plan Procedures and Treatment: Consent by patient was obtained for treatment procedures.   Debridement of mycotic and hypertrophic toenails, 1 through 5 bilateral and clearing of subungual debris. No ulceration, no infection noted.  Return Visit-Office Procedure: Patient instructed to return to the office for a follow up visit 3 months for continued evaluation and treatment.    Gardiner Barefoot DPM

## 2019-05-15 ENCOUNTER — Other Ambulatory Visit: Payer: Self-pay | Admitting: Cardiology

## 2019-05-15 DIAGNOSIS — I110 Hypertensive heart disease with heart failure: Secondary | ICD-10-CM

## 2019-05-24 DIAGNOSIS — G894 Chronic pain syndrome: Secondary | ICD-10-CM | POA: Diagnosis not present

## 2019-06-01 ENCOUNTER — Telehealth: Payer: Medicare Other | Admitting: Cardiology

## 2019-06-13 NOTE — Progress Notes (Signed)
Virtual Visit via Video Note   This visit type was conducted due to national recommendations for restrictions regarding the COVID-19 Pandemic (e.g. social distancing) in an effort to limit this patient's exposure and mitigate transmission in our community.  Due to his co-morbid illnesses, this patient is at least at moderate risk for complications without adequate follow up.  This format is felt to be most appropriate for this patient at this time.  All issues noted in this document were discussed and addressed.  A limited physical exam was performed with this format.  Please refer to the patient's chart for his consent to telehealth for [facility]. Date:  06/14/2019   ID:  Joel Dominguez, DOB [birth year], MRN [id number]  PCP:  Mellody Dance, DO  Cardiologist:  Shirlee More, MD    Referring MD: Mellody Dance, DO    ASSESSMENT:    1. Chronic atrial fibrillation   2. Nonrheumatic aortic valve stenosis   3. Chronic anticoagulation   4. Hypertensive heart disease with heart failure ([place])   5. Chronic combined systolic and diastolic heart failure (HCC)    PLAN:    In order of problems listed above:  1. Stable rate controlled continue current medications including anticoagulant 2. Recheck echocardiogram greater than 10 years 3. Continue anticoagulant 4. BP at target continue current treatment including diuretic beta-blocker 5. Stable compensated continue his current loop distal diuretic   Next appointment: 6 months   Medication Adjustments/Labs and Tests Ordered: Current medicines are reviewed at length with the patient today.  Concerns regarding medicines are outlined above.  No orders of the defined types were placed in this encounter.  No orders of the defined types were placed in this encounter.   No chief complaint on file.   History of Present Illness:    Joel Dominguez is a 68 y.o. male with a hx of chronic atrial fibrillation, mild aortic stenosis on  echo 2016, heart failure, RBBB with LAHB, hypertension, anticoagulation and PAH  last seen 03/02/2019. Compliance with diet, lifestyle and medications: Yes  Weight is stable no edema wear support hose has no complaints of shortness of breath orthopnea chest pain palpitation.  No bleeding from his anticoagulant.  He is practicing social isolation wears a mask and good handwashing Past Medical History:  Diagnosis Date   A-fib- couple yrs now. 10/08/2016   Aortic stenosis 11/06/2017   Atherosclerotic heart disease of native coronary artery with unspecified angina pectoris ([place]) 10/08/2016   CHF (congestive heart failure) ([place])    Chronic anticoagulation 10/08/2016   Foot drop- R 10/08/2016   Patient known foot drop for many years due to lumbar spinal stenosis.-Over the past couple of months symptoms have gotten worse.  He feels he is tripping more and cannot lift his foot up as much. Toes are curling and calluses on end of toes.    - Did see spinal surgeon many yrs ago.  Declined sx at that time.      Generalized OA 10/08/2016   Ortho doc-  Dr. [facility] in [place]- both knees replaced 08, 09.      H/O noncompliance with medical treatment, presenting hazards to health 10/22/2017   Heart disease    Hypertension    Obesity, Class III, BMI 40-49.9 (morbid obesity) ([place]) 10/08/2016   OSA (obstructive sleep apnea) 10/08/2016   Bi-PAP nitely- been 20+ yrs now   Pulmonary hypertension ([place]) 10/08/2016   Spinal stenosis, lumbar region, with neurogenic claudication 10/08/2016  Back specialist in [place]-    only surgeon he would use- pt declined referral here for back pain mgt     Stroke [facility])     Past Surgical History:  Procedure Laterality Date   REPLACEMENT TOTAL KNEE Bilateral    TIBIA FRACTURE SURGERY      Current Medications: Current Meds  Medication Sig   apixaban (ELIQUIS) 5 MG TABS tablet Take 1 tablet (5 mg total) by mouth 2 (two) times daily.   bumetanide (BUMEX) 2 MG tablet  TAKE 1 TABLET BY MOUTH 2 TIMES DAILY.   carvedilol (COREG) 25 MG tablet TAKE 1 TABLET BY MOUTH 2 TIMES DAILY WITH A MEAL.   HYDROcodone-acetaminophen (NORCO) 10-325 MG tablet Take 1 tablet by mouth as needed.   Multiple Vitamin (MULTIVITAMIN) tablet Take 1 tablet by mouth daily.   potassium chloride (K-DUR) 10 MEQ tablet Take 2 tablets (20 mEq total) by mouth 2 (two) times daily.   spironolactone (ALDACTONE) 25 MG tablet TAKE 1 TABLET BY MOUTH DAILY.   telmisartan (MICARDIS) 80 MG tablet TAKE 1 TABLET BY MOUTH DAILY.   Vitamin D, Ergocalciferol, (DRISDOL) 1.25 MG (50000 UT) CAPS capsule TAKE 1 CAPSULE BY MOUTH EVERY 7 DAYS.     Allergies:   Cephalexin   Social History   Socioeconomic History   Marital status: Married    Spouse name: Not on file   Number of children: Not on file   Years of education: Not on file   Highest education level: Not on file  Occupational History   Not on file  Social Needs   Financial resource strain: Not on file   Food insecurity    Worry: Not on file    Inability: Not on file   Transportation needs    Medical: Not on file    Non-medical: Not on file  Tobacco Use   Smoking status: Never Smoker   Smokeless tobacco: Never Used  Substance and Sexual Activity   Alcohol use: Not Currently   Drug use: No   Sexual activity: Not on file  Lifestyle   Physical activity    Days per week: Not on file    Minutes per session: Not on file   Stress: Not on file  Relationships   Social connections    Talks on phone: Not on file    Gets together: Not on file    Attends religious service: Not on file    Active member of club or organization: Not on file    Attends meetings of clubs or organizations: Not on file    Relationship status: Not on file  Other Topics Concern   Not on file  Social History Narrative   Not on file     Family History: The patient's family history includes Cancer in his father; Congestive Heart Failure in  his mother and sister; Diabetes in his brother and mother; Hypertension in his brother, mother, sister, and sister. ROS:   Please see the history of present illness.    All other systems reviewed and are negative.  EKGs/Labs/Other Studies Reviewed:    The following studies were reviewed today:  Recent Labs: 08/28/2018: NT-Pro BNP 447 03/08/2019: ALT 24; TSH 0.800 04/15/2019: BUN 27; Creatinine, Ser 1.42; Hemoglobin 13.5; Platelets 220; Potassium 4.3; Sodium 137  Recent Lipid Panel    Component Value Date/Time   CHOL 178 03/08/2019 1038   TRIG 251 (H) 03/08/2019 1038   HDL 36 (L) 03/08/2019 1038   CHOLHDL 4.9 03/08/2019 1038  CHOLHDL 4.3 10/08/2016 1144   VLDL 45 (H) 10/08/2016 1144   LDLCALC 92 03/08/2019 1038    Physical Exam:    VS:  BP 138/66 (BP Location: Right Arm, Patient Position: Sitting, Cuff Size: Normal)    Pulse 75    Ht 6' (1.829 m)    Wt (!) 330 lb (149.7 kg)    BMI 44.76 kg/m     Wt Readings from Last 3 Encounters:  06/14/19 (!) 330 lb (149.7 kg)  04/15/19 (!) 330 lb (149.7 kg)  03/09/19 (!) 330 lb (149.7 kg)     Constitutional, well-nourished well-developed in no acute distress Vital signs reviewed Eyes, conjunctiva and sclera are normal without pallor or icterus extraocular motions intact and normal there is no lid lag Respiratory, normal effort and excursion no audible wheezing without a stethoscope Cardiovascular, no neck vein distention or peripheral edema Skin, no rash skin lesion or ulceration of the extremities Neurologic, cranial nerves II to XII are grossly intact and the patient moves all 4 extremities Neuro/Psychiatric, judgment and thought processes are intact and coherent, alert and oriented x3, mood and affect appear normal.   Signed, Shirlee More, MD  06/14/2019 8:59 AM    [place]

## 2019-06-14 ENCOUNTER — Telehealth (INDEPENDENT_AMBULATORY_CARE_PROVIDER_SITE_OTHER): Payer: Medicare Other | Admitting: Cardiology

## 2019-06-14 ENCOUNTER — Telehealth: Payer: Self-pay | Admitting: Cardiology

## 2019-06-14 ENCOUNTER — Encounter: Payer: Self-pay | Admitting: Cardiology

## 2019-06-14 ENCOUNTER — Other Ambulatory Visit: Payer: Self-pay

## 2019-06-14 VITALS — BP 138/66 | HR 75 | Ht 72.0 in | Wt 330.0 lb

## 2019-06-14 DIAGNOSIS — I110 Hypertensive heart disease with heart failure: Secondary | ICD-10-CM

## 2019-06-14 DIAGNOSIS — I350 Nonrheumatic aortic (valve) stenosis: Secondary | ICD-10-CM | POA: Diagnosis not present

## 2019-06-14 DIAGNOSIS — Z7901 Long term (current) use of anticoagulants: Secondary | ICD-10-CM

## 2019-06-14 DIAGNOSIS — I5042 Chronic combined systolic (congestive) and diastolic (congestive) heart failure: Secondary | ICD-10-CM

## 2019-06-14 DIAGNOSIS — I482 Chronic atrial fibrillation, unspecified: Secondary | ICD-10-CM | POA: Diagnosis not present

## 2019-06-14 NOTE — Addendum Note (Signed)
Addended by: Stevan Born on: 06/14/2019 09:14 AM   Modules accepted: Orders

## 2019-06-14 NOTE — Telephone Encounter (Signed)
Virtual Visit Pre-Appointment Phone Call  "(Name), I am calling you today to discuss your upcoming appointment. We are currently trying to limit exposure to the virus that causes COVID-19 by seeing patients at home rather than in the office."  1. "What is the BEST phone number to call the day of the visit?" - include this in appointment notes  2. Do you have or have access to (through a family member/friend) a smartphone with video capability that we can use for your visit?" a. If yes - list this number in appt notes as cell (if different from BEST phone #) and list the appointment type as a VIDEO visit in appointment notes b. If no - list the appointment type as a PHONE visit in appointment notes  3. Confirm consent - "In the setting of the current Covid19 crisis, you are scheduled for a (phone or video) visit with your provider on (date) at (time).  Just as we do with many in-office visits, in order for you to participate in this visit, we must obtain consent.  If you'd like, I can send this to your mychart (if signed up) or email for you to review.  Otherwise, I can obtain your verbal consent now.  All virtual visits are billed to your insurance company just like a normal visit would be.  By agreeing to a virtual visit, we'd like you to understand that the technology does not allow for your provider to perform an examination, and thus may limit your provider's ability to fully assess your condition. If your provider identifies any concerns that need to be evaluated in person, we will make arrangements to do so.  Finally, though the technology is pretty good, we cannot assure that it will always work on either your or our end, and in the setting of a video visit, we may have to convert it to a phone-only visit.  In either situation, we cannot ensure that we have a secure connection.  Are you willing to proceed?" STAFF: Did the patient verbally acknowledge consent to telehealth visit? Document  YES/NO here: yes  4. Advise patient to be prepared - "Two hours prior to your appointment, go ahead and check your blood pressure, pulse, oxygen saturation, and your weight (if you have the equipment to check those) and write them all down. When your visit starts, your provider will ask you for this information. If you have an Apple Watch or Kardia device, please plan to have heart rate information ready on the day of your appointment. Please have a pen and paper handy nearby the day of the visit as well."  5. Give patient instructions for MyChart download to smartphone OR Doximity/Doxy.me as below if video visit (depending on what platform provider is using)  6. Inform patient they will receive a phone call 15 minutes prior to their appointment time (may be from unknown caller ID) so they should be prepared to answer    TELEPHONE CALL NOTE  Joel Dominguez has been deemed a candidate for a follow-up tele-health visit to limit community exposure during the Covid-19 pandemic. I spoke with the patient via phone to ensure availability of phone/video source, confirm preferred email & phone number, and discuss instructions and expectations.  I reminded Joel Dominguez to be prepared with any vital sign and/or heart rhythm information that could potentially be obtained via home monitoring, at the time of his visit. I reminded Joel Dominguez to expect a phone call prior to his visit.  Joel Dominguez 06/14/2019 8:28 AM   INSTRUCTIONS FOR DOWNLOADING THE MYCHART APP TO SMARTPHONE  - The patient must first make sure to have activated MyChart and know their login information - If Apple, go to [employer] and type in MyChart in the search bar and download the app. If Android, ask patient to go to [employer] and type in [place] in the search bar and download the app. The app is free but as with any other app downloads, their phone may require them to verify saved payment information or Apple/Android  password.  - The patient will need to then log into the app with their MyChart username and password, and select [facility] as their healthcare provider to link the account. When it is time for your visit, go to the MyChart app, find appointments, and click Begin Video Visit. Be sure to Select Allow for your device to access the Microphone and Camera for your visit. You will then be connected, and your provider will be with you shortly.  **If they have any issues connecting, or need assistance please contact MyChart service desk [phone])**  **If using a computer, in order to ensure the best quality for their visit they will need to use either of the following Internet Browsers: [employer], or Google Chrome**  IF USING DOXIMITY or DOXY.ME - The patient will receive a link just prior to their visit by text.     FULL LENGTH CONSENT FOR TELE-HEALTH VISIT   I hereby voluntarily request, consent and authorize [facility] and its employed or contracted physicians, physician assistants, nurse practitioners or other licensed health care professionals (the Practitioner), to provide me with telemedicine health care services (the Services") as deemed necessary by the treating Practitioner. I acknowledge and consent to receive the Services by the Practitioner via telemedicine. I understand that the telemedicine visit will involve communicating with the Practitioner through live audiovisual communication technology and the disclosure of certain medical information by electronic transmission. I acknowledge that I have been given the opportunity to request an in-person assessment or other available alternative prior to the telemedicine visit and am voluntarily participating in the telemedicine visit.  I understand that I have the right to withhold or withdraw my consent to the use of telemedicine in the course of my care at any time, without affecting my right to future care or treatment,  and that the Practitioner or I may terminate the telemedicine visit at any time. I understand that I have the right to inspect all information obtained and/or recorded in the course of the telemedicine visit and may receive copies of available information for a reasonable fee.  I understand that some of the potential risks of receiving the Services via telemedicine include:   Delay or interruption in medical evaluation due to technological equipment failure or disruption;  Information transmitted may not be sufficient (e.g. poor resolution of images) to allow for appropriate medical decision making by the Practitioner; and/or   In rare instances, security protocols could fail, causing a breach of personal health information.  Furthermore, I acknowledge that it is my responsibility to provide information about my medical history, conditions and care that is complete and accurate to the best of my ability. I acknowledge that Practitioner's advice, recommendations, and/or decision may be based on factors not within their control, such as incomplete or inaccurate data provided by me or distortions of diagnostic images or specimens that may result from electronic transmissions. I understand that the  practice of medicine is not an Chief Strategy Officer and that Practitioner makes no warranties or guarantees regarding treatment outcomes. I acknowledge that I will receive a copy of this consent concurrently upon execution via email to the email address I last provided but may also request a printed copy by calling the office of [facility].    I understand that my insurance will be billed for this visit.   I have read or had this consent read to me.  I understand the contents of this consent, which adequately explains the benefits and risks of the Services being provided via telemedicine.   I have been provided ample opportunity to ask questions regarding this consent and the Services and have had my questions  answered to my satisfaction.  I give my informed consent for the services to be provided through the use of telemedicine in my medical care  By participating in this telemedicine visit I agree to the above.

## 2019-06-14 NOTE — Patient Instructions (Addendum)
Medication Instructions:  Your physician recommends that you continue on your current medications as directed. Please refer to the Current Medication list given to you today.  If you need a refill on your cardiac medications before your next appointment, please call your pharmacy.   Lab work: NONE  If you have labs (blood work) drawn today and your tests are completely normal, you will receive your results only by: Marland Kitchen MyChart Message (if you have MyChart) OR . A paper copy in the mail If you have any lab test that is abnormal or we need to change your treatment, we will call you to review the results.  Testing/Procedures: Your physician has requested that you have an echocardiogram. Echocardiography is a painless test that uses sound waves to create images of your heart. It provides your doctor with information about the size and shape of your heart and how well your heart's chambers and valves are working. This procedure takes approximately one hour. There are no restrictions for this procedure. This has been scheduled in the [place] office on Wednesday, 06/30/2019, at 9:15 am.    Follow-Up: At [facility], you and your health needs are our priority.  As part of our continuing mission to provide you with exceptional heart care, we have created designated Provider Care Teams.  These Care Teams include your primary Cardiologist (physician) and Advanced Practice Providers (APPs -  Physician Assistants and Nurse Practitioners) who all work together to provide you with the care you need, when you need it. You will need a follow up appointment in 6 months.  Please call our office 2 months in advance to schedule this appointment.

## 2019-06-14 NOTE — Addendum Note (Signed)
Addended by: Austin Miles on: 06/14/2019 09:27 AM   Modules accepted: Orders

## 2019-06-21 ENCOUNTER — Other Ambulatory Visit: Payer: Self-pay | Admitting: Cardiology

## 2019-06-21 ENCOUNTER — Other Ambulatory Visit: Payer: Self-pay | Admitting: Family Medicine

## 2019-06-21 DIAGNOSIS — I10 Essential (primary) hypertension: Secondary | ICD-10-CM

## 2019-06-24 ENCOUNTER — Other Ambulatory Visit: Payer: Self-pay | Admitting: Cardiology

## 2019-06-26 ENCOUNTER — Other Ambulatory Visit: Payer: Self-pay | Admitting: Cardiology

## 2019-06-30 ENCOUNTER — Other Ambulatory Visit: Payer: Self-pay

## 2019-06-30 ENCOUNTER — Ambulatory Visit (INDEPENDENT_AMBULATORY_CARE_PROVIDER_SITE_OTHER): Payer: Medicare Other

## 2019-06-30 DIAGNOSIS — I350 Nonrheumatic aortic (valve) stenosis: Secondary | ICD-10-CM

## 2019-06-30 DIAGNOSIS — I110 Hypertensive heart disease with heart failure: Secondary | ICD-10-CM

## 2019-06-30 DIAGNOSIS — I482 Chronic atrial fibrillation, unspecified: Secondary | ICD-10-CM | POA: Diagnosis not present

## 2019-06-30 DIAGNOSIS — I5042 Chronic combined systolic (congestive) and diastolic (congestive) heart failure: Secondary | ICD-10-CM | POA: Diagnosis not present

## 2019-06-30 NOTE — Progress Notes (Signed)
Complete echocardiogram has been performed.  Jimmy Kimberli Winne RDCS, RVT 

## 2019-07-14 ENCOUNTER — Encounter: Payer: Self-pay | Admitting: Family Medicine

## 2019-07-15 DIAGNOSIS — Z5181 Encounter for therapeutic drug level monitoring: Secondary | ICD-10-CM | POA: Diagnosis not present

## 2019-07-15 DIAGNOSIS — M5417 Radiculopathy, lumbosacral region: Secondary | ICD-10-CM | POA: Diagnosis not present

## 2019-07-15 DIAGNOSIS — Z79899 Other long term (current) drug therapy: Secondary | ICD-10-CM | POA: Diagnosis not present

## 2019-07-15 DIAGNOSIS — G894 Chronic pain syndrome: Secondary | ICD-10-CM | POA: Diagnosis not present

## 2019-07-15 DIAGNOSIS — G8929 Other chronic pain: Secondary | ICD-10-CM | POA: Diagnosis not present

## 2019-07-15 DIAGNOSIS — M5441 Lumbago with sciatica, right side: Secondary | ICD-10-CM | POA: Diagnosis not present

## 2019-07-20 ENCOUNTER — Telehealth: Payer: Self-pay | Admitting: Family Medicine

## 2019-07-20 ENCOUNTER — Other Ambulatory Visit: Payer: Self-pay | Admitting: Family Medicine

## 2019-07-20 DIAGNOSIS — I10 Essential (primary) hypertension: Secondary | ICD-10-CM

## 2019-07-20 NOTE — Telephone Encounter (Signed)
[facility] @ [place] request callback to confirm dosage on this Rx : ( Should pt be taking 1 tablet daily ??? )  spironolactone (ALDACTONE) 25 MG tablet [id number]   Order Details Dose: 25 mg Route: Oral Frequency: Once  Dispense Quantity: 15 tablet Refills: 0 Fills remaining: --        Sig: Take 1 tablet (25 mg total) by mouth once for 1 dose. OFFICE VISIT REQUIRED PRIOR TO ANY FURTHER REFILLS          ---Please call them at :   [place], [place] - [place] [phone] (Phone) [phone] (Fax)   --glh

## 2019-07-20 NOTE — Telephone Encounter (Signed)
Advised Alden Benjamin that RX should be for one tablet daily.  Charyl Bigger, CMA

## 2019-07-21 ENCOUNTER — Telehealth: Payer: Self-pay | Admitting: Family Medicine

## 2019-07-21 DIAGNOSIS — M48061 Spinal stenosis, lumbar region without neurogenic claudication: Secondary | ICD-10-CM

## 2019-07-21 DIAGNOSIS — M21371 Foot drop, right foot: Secondary | ICD-10-CM

## 2019-07-21 NOTE — Telephone Encounter (Signed)
Patient was referred to an orthopedist, but please advise on below

## 2019-07-21 NOTE — Telephone Encounter (Signed)
Patient recently bought a scooter and was advised that if he gets a prescription for this from his PCP (must say scooter) that they can get a portion refunded back since it is not covered by insurance. Please advise if we can write this order and if so it needs to faxed to [phone] attention Cleotilde Neer.

## 2019-07-22 NOTE — Telephone Encounter (Signed)
Absolutely please write the letter and I will sign.  Thank you very much

## 2019-07-22 NOTE — Telephone Encounter (Signed)
Order given to Annabell Sabal to fax.  Charyl Bigger, CMA

## 2019-07-22 NOTE — Addendum Note (Signed)
Addended by: Fonnie Mu on: 07/22/2019 05:33 PM   Modules accepted: Orders

## 2019-08-03 ENCOUNTER — Other Ambulatory Visit: Payer: Self-pay | Admitting: Family Medicine

## 2019-08-03 DIAGNOSIS — I10 Essential (primary) hypertension: Secondary | ICD-10-CM

## 2019-08-04 ENCOUNTER — Encounter: Payer: Self-pay | Admitting: Podiatry

## 2019-08-04 ENCOUNTER — Other Ambulatory Visit: Payer: Self-pay

## 2019-08-04 ENCOUNTER — Ambulatory Visit (INDEPENDENT_AMBULATORY_CARE_PROVIDER_SITE_OTHER): Payer: Medicare Other | Admitting: Podiatry

## 2019-08-04 DIAGNOSIS — D689 Coagulation defect, unspecified: Secondary | ICD-10-CM | POA: Diagnosis not present

## 2019-08-04 DIAGNOSIS — L97509 Non-pressure chronic ulcer of other part of unspecified foot with unspecified severity: Secondary | ICD-10-CM | POA: Insufficient documentation

## 2019-08-04 DIAGNOSIS — M79675 Pain in left toe(s): Secondary | ICD-10-CM | POA: Diagnosis not present

## 2019-08-04 DIAGNOSIS — L97511 Non-pressure chronic ulcer of other part of right foot limited to breakdown of skin: Secondary | ICD-10-CM

## 2019-08-04 DIAGNOSIS — B351 Tinea unguium: Secondary | ICD-10-CM

## 2019-08-04 DIAGNOSIS — M79674 Pain in right toe(s): Secondary | ICD-10-CM

## 2019-08-04 DIAGNOSIS — M21371 Foot drop, right foot: Secondary | ICD-10-CM

## 2019-08-04 HISTORY — DX: Non-pressure chronic ulcer of other part of unspecified foot with unspecified severity: L97.509

## 2019-08-04 NOTE — Progress Notes (Signed)
Complaint:  Visit Type: Patient returns to my office for continued preventative foot care services. Complaint: Patient states" my nails have grown long and thick and become painful to walk and wear shoes" Patient has open wound third toe right foot. toe right foot. The patient presents for preventative foot care services. No changes to ROS.  Patient is taking eliquiss.  Patient has foot drop right foot.  Podiatric Exam: Vascular: dorsalis pedis and posterior tibial pulses are palpable bilateral. Capillary return is immediate. Temperature gradient is WNL. Skin turgor WNL Venous insufficiency. Sensorium: Diminished  Semmes Weinstein monofilament test. Normal tactile sensation bilaterally. Nail Exam: Pt has thick disfigured discolored nails with subungual debris noted bilateral entire nail hallux through fifth toenails Ulcer Exam: There is no evidence of ulcer or pre-ulcerative changes or infection. Orthopedic Exam: Muscle tone and strength are WNL. No limitations in general ROM. No crepitus or effusions noted. Foot drop right foot.  Hammer toe second right foot. Skin: No Porokeratosis. No infection or ulcers.  Open wound distal aspect third toe right foot. No redness or infection. Pinhole open wound.  Diagnosis:  Onychomycosis, , Pain in right toe, pain in left toes Ulcer third toe right  Treatment & Plan Procedures and Treatment: Consent by patient was obtained for treatment procedures.   Debridement of mycotic and hypertrophic toenails, 1 through 5 bilateral and clearing of subungual debris. Debride ulcer and povidine/DSD.  Continue home care.  Padding dispensed. Return Visit-Office Procedure: Patient instructed to return to the office for a follow up visit 3 months for continued evaluation and treatment. RTC for ulcer evaluation as needed.    Gardiner Barefoot DPM

## 2019-08-05 ENCOUNTER — Encounter: Payer: Self-pay | Admitting: Family Medicine

## 2019-08-05 ENCOUNTER — Ambulatory Visit (INDEPENDENT_AMBULATORY_CARE_PROVIDER_SITE_OTHER): Payer: Medicare Other | Admitting: Family Medicine

## 2019-08-05 VITALS — BP 150/95 | HR 78

## 2019-08-05 DIAGNOSIS — M5417 Radiculopathy, lumbosacral region: Secondary | ICD-10-CM

## 2019-08-05 DIAGNOSIS — Z7901 Long term (current) use of anticoagulants: Secondary | ICD-10-CM

## 2019-08-05 DIAGNOSIS — Z23 Encounter for immunization: Secondary | ICD-10-CM | POA: Diagnosis not present

## 2019-08-05 DIAGNOSIS — R809 Proteinuria, unspecified: Secondary | ICD-10-CM

## 2019-08-05 DIAGNOSIS — M21371 Foot drop, right foot: Secondary | ICD-10-CM

## 2019-08-05 DIAGNOSIS — Z79899 Other long term (current) drug therapy: Secondary | ICD-10-CM

## 2019-08-05 DIAGNOSIS — G4730 Sleep apnea, unspecified: Secondary | ICD-10-CM | POA: Diagnosis not present

## 2019-08-05 DIAGNOSIS — I25119 Atherosclerotic heart disease of native coronary artery with unspecified angina pectoris: Secondary | ICD-10-CM | POA: Diagnosis not present

## 2019-08-05 DIAGNOSIS — R29898 Other symptoms and signs involving the musculoskeletal system: Secondary | ICD-10-CM

## 2019-08-05 DIAGNOSIS — I110 Hypertensive heart disease with heart failure: Secondary | ICD-10-CM | POA: Diagnosis not present

## 2019-08-05 DIAGNOSIS — R7303 Prediabetes: Secondary | ICD-10-CM | POA: Diagnosis not present

## 2019-08-05 DIAGNOSIS — I5042 Chronic combined systolic (congestive) and diastolic (congestive) heart failure: Secondary | ICD-10-CM | POA: Diagnosis not present

## 2019-08-05 DIAGNOSIS — I272 Pulmonary hypertension, unspecified: Secondary | ICD-10-CM | POA: Diagnosis not present

## 2019-08-05 DIAGNOSIS — L97509 Non-pressure chronic ulcer of other part of unspecified foot with unspecified severity: Secondary | ICD-10-CM

## 2019-08-05 LAB — POCT UA - MICROALBUMIN
Albumin/Creatinine Ratio, Urine, POC: <30
Creatinine, POC: 200 mg/dL
Microalbumin Ur, POC: 30 mg/L

## 2019-08-05 MED ORDER — SPIRONOLACTONE 25 MG PO TABS: 25.0000 mg | ORAL_TABLET | Freq: Every day | ORAL | 1 refills | Status: DC

## 2019-08-05 NOTE — Progress Notes (Signed)
Impression and Recommendations:    1. Hypertensive heart disease with heart failure ([place])   2. Pulmonary hypertension ([place])   3. Atherosclerosis of native coronary artery with angina pectoris, unspecified whether native or transplanted heart ([place])   4. Chronic combined systolic and diastolic heart failure (HCC)   5. Obesity, Class III, BMI 40-49.9 (morbid obesity) ([place])   6. Chronic ulcer of toe, unspecified laterality, unspecified ulcer stage ([place])   7. Sleep apnea with use of continuous positive airway pressure (CPAP)   8. Prediabetes   9. Positive for microalbuminuria   10. Right foot drop   11. Lumbosacral radiculopathy   12. Left leg weakness   13. High risk medication use   14. Chronic anticoagulation   15. Need for vaccination     - Need for influenza vaccination.  Pulmonary Hypertension;   CAD:   Chronic A-Fib on Anti-Coag  - BP elevated on intake - likely due to walking in here & being in poor cardiovascular condition - Per pt, BP controlled at home- takes it and recalls numbers - WNL's per pt - Reviewed goal BP with patient today. -  Sx stable today - Asx - Continue treatment plan as established. See med list below. - Patient tolerating meds well without complication.  Denies S-E  - Lifestyle changes such as dash diet and engaging in a regular exercise program discussed with patient.   Encourage patient to improve cardiovascular conditioning through daily walks of 5 minutes on flat ground with wife.  Try to do twice daily and work his way up gradually  - Ongoing ambulatory BP monitoring encouraged. Keep log and bring in next OV.  - Will continue to monitor; RF meds   Chronic Combined Systolic & Diastolic Heart Failure - Pt asymptomatic and stable at this time. - Denies red flag cardiac symptoms and signs of CHF.  PE- N - Continue management as established by Cards- imaging/ monitoring per them. -  RF meds.   OSA - Managed on CPAP - Stable on CPAP at this  time. - Advised patient to continue maintenance as advised by CPAP machine supplier and per insurance allowance ( in terms of when he  Can get reassessed and new machines etc) - Will continue to monitor.   Right Foot Drop, Lumbosacral Radiculopathy, Leg Weakness - Discussed referral to physical therapy today per pt request to improve strength/ mobility - Discussed symptoms with patient today.  Advised patient that supervising specialty should ideally be monitoring the patient with MRI and keeping an eye out for nerve impingement.  - Patient knows to be careful where he walks and avoid hills or uneven surfaces. - Prudent practices discussed at length.  - Discussed possible need for specialty referral and imaging in the future PRN. - Will continue to monitor.   BMI Counseling - BMI of 44.75 Explained to patient what BMI refers to, and what it means medically.    Told patient to think about it as a "medical risk stratification measurement" and how increasing BMI is associated with increasing risk/ or worsening state of various diseases such as hypertension, hyperlipidemia, diabetes, premature OA, depression etc.  American Heart Association guidelines for healthy diet, basically Mediterranean diet, and exercise guidelines of 30 minutes 5 days per week or more discussed in detail.  Health counseling performed.  All questions answered.   Lifestyle & Preventative Health Maintenance - Advised patient to continue working toward exercising to remain mobile and improve overall mental, physical, and emotional  health.    - Reviewed the "spokes of the wheel" of mood and health management.  Stressed the importance of ongoing prudent habits, including regular exercise, appropriate sleep hygiene, healthful dietary habits, and prayer/meditation to relax.  - Encouraged patient to engage in daily physical activity, especially a formal exercise routine.  Recommended that the patient eventually strive for at  least 150 minutes of moderate cardiovascular activity per week according to guidelines established by the [facility].   - Healthy dietary habits encouraged, including low-carb, and high amounts of lean protein in diet.   - Patient should also consume adequate amounts of water.   Recommendations - Patient knows to follow up with all established specialists as recommended. - Discussed need for pneumonia and shingles vaccine.   Education and routine counseling performed. Handouts provided.   Orders Placed This Encounter  Procedures   Flu Vaccine QUAD High Dose(Fluad)   Comprehensive metabolic panel   Hemoglobin A1c   Ambulatory referral to Physical Therapy   POCT UA - Microalbumin    Meds ordered this encounter  Medications   spironolactone (ALDACTONE) 25 MG tablet    Sig: Take 1 tablet (25 mg total) by mouth daily.    Dispense:  90 tablet    Refill:  1    Medications Discontinued During This Encounter  Medication Reason   spironolactone (ALDACTONE) 25 MG tablet Reorder     The patient was counseled, risk factors were discussed, anticipatory guidance given.  Gross side effects, risk and benefits, and alternatives of medications discussed with patient.  Patient is aware that all medications have potential side effects and we are unable to predict every side effect or drug-drug interaction that may occur.  Expresses verbal understanding and consents to current therapy plan and treatment regimen.  Return for f/up 3-41mo or sooner if issues.  Please see AVS handed out to patient at the end of our visit for further patient instructions/ counseling done pertaining to today's office visit.    Note:  This document was prepared using Dragon voice recognition software and may include unintentional dictation errors.  This document serves as a record of services personally performed by Mellody Dance, DO. It was created on her behalf by Toni Amend, a trained medical scribe.  The creation of this record is based on the scribe's personal observations and the provider's statements to them.   I have reviewed the above medical documentation for accuracy and completeness and I concur.  Mellody Dance, DO 08/05/2019 1:55 PM      Subjective:    HPI: Joel Dominguez is a 68 y.o. male who presents to [place] [facility] today for follow up of [place].    Overall, pt and wife state they've been good.  Uses his CPAP daily; wife notes "it's only a couple of years old, but we haven't had it checked out."   Morbid obesity with several significant sequelae:  -  Foot Ulcer, Bottom of Right Foot Notes he followed up about foot concerns recently, told another "ulcer" to another toe.   Notes his first ulcer has healed and patient developed a new one.  He follows up with specialist about his feet every three months. Confirms he uses betadine on the area and leaves the area open to air while at home.  - Right Knee s/p Joint Replacement Patient desires more physical therapy on his right leg; he had a total joint replacement of his right knee.  Notes last time he attended  physical therapy was last December.  Has been told "some say the nerve could be pinched there [in the leg]."  He has no complaint with his left leg/knee.     Pulm HTN/  CHF: -  His blood pressure has reportedly been controlled at home.  Pt has been checking it regularly.   Says his BP usually runs in the 130's/80's and lower at home.   He checks once ot twice weekly.  States his blood pressure has been good at home.  Denies weight gain.  Says "I'm right where I was, I stayed."  - Patient reports good compliance with blood pressure medications  - Denies medication S-E   - Smoking Status noted   - He denies new onset of: chest pain, exercise intolerance, shortness of breath, dizziness, visual changes, headache, lower extremity swelling or claudication.   Denies symptoms  of congestive heart failure.  Denies worsening swelling of the lower extremities.  Last 3 blood pressure readings in our office are as follows: BP Readings from Last 3 Encounters:  08/05/19 (!) 150/95  06/14/19 138/66  04/15/19 (!) 141/71    Pulse Readings from Last 3 Encounters:  08/05/19 78  06/14/19 75  04/15/19 73      Patient Care Team    Relationship Specialty Notifications Start End  Mellody Dance, DO PCP - General Family Medicine  10/08/16   Lucia Bitter., MD Physician Assistant Pain Medicine  11/13/17    Comment: NP Darlin Coco, MD Consulting Physician Cardiology  11/13/17   Edrick Kins, DPM Consulting Physician Podiatry  02/23/19    Comment: provider:  Dr. Prudence Davidson      Lab Results  Component Value Date   CREATININE 1.15 08/05/2019   BUN 18 08/05/2019   NA 138 08/05/2019   K 4.6 08/05/2019   CL 99 08/05/2019   CO2 23 08/05/2019    Lab Results  Component Value Date   CHOL 178 03/08/2019   CHOL 161 10/22/2017   CHOL 150 07/08/2017    Lab Results  Component Value Date   HDL 36 (L) 03/08/2019   HDL 43 10/22/2017   HDL 36 07/08/2017    Lab Results  Component Value Date   LDLCALC 92 03/08/2019   [place] 92 10/22/2017   LDLCALC 84 07/08/2017    Lab Results  Component Value Date   TRIG 251 (H) 03/08/2019   TRIG 129 10/22/2017   TRIG 150 07/08/2017    Lab Results  Component Value Date   CHOLHDL 4.9 03/08/2019   CHOLHDL 3.7 10/22/2017   CHOLHDL 4.3 10/08/2016     Patient Active Problem List   Diagnosis Date Noted   Hypertensive heart disease with heart failure ([place]) 11/10/2017    Priority: High   Sleep apnea with use of continuous positive airway pressure (CPAP) 10/08/2016    Priority: High   Pulmonary hypertension ([place]) 10/08/2016    Priority: High   Obesity, Class III, BMI 40-49.9 (morbid obesity) ([place]) 10/08/2016    Priority: High   Positive for microalbuminuria 11/19/2017    Priority: Medium   h/o  Stroke 10/08/2016    Priority: Medium   Atherosclerotic heart disease of native coronary artery with unspecified angina pectoris ([place]) 10/08/2016    Priority: Medium   Chronic combined systolic and diastolic heart failure ([place]) 10/08/2016    Priority: Medium   Chronic atrial fibrillation ([place]) 10/08/2016    Priority: Medium   Chronic anticoagulation 10/08/2016    Priority:  Medium   Generalized OA 10/08/2016    Priority: Low   Foot drop- R 10/08/2016    Priority: Low   Lumbar spinal stenosis 10/08/2016    Priority: Low   Chronic foot ulcer ([place]) 08/08/2019   Prediabetes 08/08/2019   Left leg weakness 08/08/2019   High risk medication use 08/08/2019   Chronic ulcer of toe ([place]) 08/04/2019   Pain due to onychomycosis of toenails of both feet 04/30/2019   Coagulation disorder ([place]) 04/30/2019   Chronic bilateral low back pain with right-sided sciatica 03/03/2018   Lumbosacral radiculopathy 01/22/2018   Osteoarthritis 11/19/2017   Vitamin D deficiency 11/19/2017   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 fascicular block) 11/07/2017   Aortic stenosis 11/06/2017   H/O noncompliance with medical treatment, presenting hazards to health 10/22/2017     Past Medical History:  Diagnosis Date   A-fib- couple yrs now. 10/08/2016   Aortic stenosis 11/06/2017   Atherosclerotic heart disease of native coronary artery with unspecified angina pectoris ([place]) 10/08/2016   CHF (congestive heart failure) ([place])    Chronic anticoagulation 10/08/2016   Foot drop- R 10/08/2016   Patient known foot drop for many years due to lumbar spinal stenosis.-Over the past couple of months symptoms have gotten worse.  He feels he is tripping more and cannot lift his foot up as much. Toes are curling and calluses on end of toes.    - Did see spinal surgeon many yrs ago.  Declined sx at that time.      Generalized OA 10/08/2016   Ortho doc-  Dr. [facility] in [place]- both knees  replaced 08, 09.      H/O noncompliance with medical treatment, presenting hazards to health 10/22/2017   Heart disease    Hypertension    Obesity, Class III, BMI 40-49.9 (morbid obesity) ([place]) 10/08/2016   OSA (obstructive sleep apnea) 10/08/2016   Bi-PAP nitely- been 20+ yrs now   Pulmonary hypertension ([place]) 10/08/2016   Spinal stenosis, lumbar region, with neurogenic claudication 10/08/2016   Back specialist in [place]-    only surgeon he would use- pt declined referral here for back pain mgt     Stroke [facility])      Past Surgical History:  Procedure Laterality Date   REPLACEMENT TOTAL KNEE Bilateral    TIBIA FRACTURE SURGERY       Family History  Problem Relation Age of Onset   Congestive Heart Failure Mother    Hypertension Mother    Diabetes Mother    Cancer Father        lung   Congestive Heart Failure Sister    Hypertension Sister    Hypertension Brother    Hypertension Sister    Diabetes Brother      Social History   Substance and Sexual Activity  Drug Use No  ,  Social History   Substance and Sexual Activity  Alcohol Use Not Currently  ,  Social History   Tobacco Use  Smoking Status Never Smoker  Smokeless Tobacco Never Used  ,    Current Outpatient Medications on File Prior to Visit  Medication Sig Dispense Refill   apixaban (ELIQUIS) 5 MG TABS tablet Take 1 tablet (5 mg total) by mouth 2 (two) times daily. 180 tablet 3   bumetanide (BUMEX) 2 MG tablet TAKE 1 TABLET BY MOUTH 2 TIMES DAILY. 180 tablet 0   carvedilol (COREG) 25 MG tablet TAKE 1 TABLET BY MOUTH 2 TIMES DAILY WITH A MEAL. [place]  tablet 1   HYDROcodone-acetaminophen (NORCO) 10-325 MG tablet Take 1 tablet by mouth as needed.     Multiple Vitamin (MULTIVITAMIN) tablet Take 1 tablet by mouth daily.     potassium chloride (K-DUR) 10 MEQ tablet TAKE 2 TABLETS BY MOUTH 2 TIMES DAILY. 180 tablet 2   telmisartan (MICARDIS) 80 MG tablet TAKE 1 TABLET BY MOUTH DAILY. 90 tablet 0     Vitamin D, Ergocalciferol, (DRISDOL) 1.25 MG (50000 UT) CAPS capsule TAKE 1 CAPSULE BY MOUTH EVERY 7 DAYS. 12 capsule 10   No current facility-administered medications on file prior to visit.      Allergies  Allergen Reactions   Cephalexin Rash     Review of Systems:   General:  Denies fever, chills Optho/Auditory:   Denies visual changes, blurred vision Respiratory:   Denies SOB, cough, wheeze, DIB  Cardiovascular:   Denies chest pain, palpitations, painful respirations Gastrointestinal:   Denies nausea, vomiting, diarrhea.  Endocrine:     Denies new hot or cold intolerance Musculoskeletal:  Denies joint swelling, gait issues, or new unexplained myalgias/ arthralgias Skin:  Denies rash, suspicious lesions  Neurological:    Denies dizziness, unexplained weakness, numbness  Psychiatric/Behavioral:   Denies mood changes  Objective:    Blood pressure (!) 150/95, pulse 78, SpO2 97 %.  There is no height or weight on file to calculate BMI.  General: Well Developed, well nourished, and in no acute distress.  HEENT: Normocephalic, atraumatic, pupils equal round reactive to light, neck supple, No carotid bruits, no JVD Skin: Warm and dry, cap RF less 2 sec Cardiac: Regular rate and rhythm, S1, S2 WNL's, no murmurs rubs or gallops Respiratory: ECTA B/L, Not using accessory muscles, speaking in full sentences. NeuroM-Sk: Ambulates w/o assistance, moves ext * 4 w/o difficulty, sensation grossly intact.  Ext: scant edema b/l lower ext Psych: No HI/SI, judgement and insight good, Euthymic mood. Full Affect.

## 2019-08-06 LAB — COMPREHENSIVE METABOLIC PANEL
ALT: 25 IU/L (ref 0–44)
AST: 23 IU/L (ref 0–40)
Albumin/Globulin Ratio: 1.2 (ref 1.2–2.2)
Albumin: 4.2 g/dL (ref 3.8–4.8)
Alkaline Phosphatase: 97 IU/L (ref 39–117)
BUN/Creatinine Ratio: 16 (ref 10–24)
BUN: 18 mg/dL (ref 8–27)
Bilirubin Total: 0.5 mg/dL (ref 0.0–1.2)
CO2: 23 mmol/L (ref 20–29)
Calcium: 9.5 mg/dL (ref 8.6–10.2)
Chloride: 99 mmol/L (ref 96–106)
Creatinine, Ser: 1.15 mg/dL (ref 0.76–1.27)
GFR calc Af Amer: 75 mL/min/{1.73_m2} (ref >59)
GFR calc non Af Amer: 65 mL/min/{1.73_m2} (ref >59)
Globulin, Total: 3.5 g/dL (ref 1.5–4.5)
Glucose: 103 mg/dL — ABNORMAL HIGH (ref 65–99)
Potassium: 4.6 mmol/L (ref 3.5–5.2)
Sodium: 138 mmol/L (ref 134–144)
Total Protein: 7.7 g/dL (ref 6.0–8.5)

## 2019-08-06 LAB — HEMOGLOBIN A1C
Est. average glucose Bld gHb Est-mCnc: 117 mg/dL
Hgb A1c MFr Bld: 5.7 % — ABNORMAL HIGH (ref 4.8–5.6)

## 2019-08-08 DIAGNOSIS — R7303 Prediabetes: Secondary | ICD-10-CM

## 2019-08-08 DIAGNOSIS — L97509 Non-pressure chronic ulcer of other part of unspecified foot with unspecified severity: Secondary | ICD-10-CM | POA: Insufficient documentation

## 2019-08-08 DIAGNOSIS — R29898 Other symptoms and signs involving the musculoskeletal system: Secondary | ICD-10-CM | POA: Insufficient documentation

## 2019-08-08 DIAGNOSIS — Z79899 Other long term (current) drug therapy: Secondary | ICD-10-CM | POA: Insufficient documentation

## 2019-08-08 HISTORY — DX: Prediabetes: R73.03

## 2019-08-08 HISTORY — DX: Other symptoms and signs involving the musculoskeletal system: R29.898

## 2019-08-14 ENCOUNTER — Other Ambulatory Visit: Payer: Self-pay | Admitting: Cardiology

## 2019-08-14 DIAGNOSIS — I110 Hypertensive heart disease with heart failure: Secondary | ICD-10-CM

## 2019-09-09 DIAGNOSIS — Z79899 Other long term (current) drug therapy: Secondary | ICD-10-CM | POA: Diagnosis not present

## 2019-09-09 DIAGNOSIS — M5417 Radiculopathy, lumbosacral region: Secondary | ICD-10-CM | POA: Diagnosis not present

## 2019-09-09 DIAGNOSIS — G894 Chronic pain syndrome: Secondary | ICD-10-CM | POA: Diagnosis not present

## 2019-09-09 DIAGNOSIS — M5441 Lumbago with sciatica, right side: Secondary | ICD-10-CM | POA: Diagnosis not present

## 2019-09-22 ENCOUNTER — Other Ambulatory Visit: Payer: Self-pay | Admitting: Cardiology

## 2019-09-22 NOTE — Telephone Encounter (Signed)
Rx refill sent to pharmacy. 

## 2019-10-21 ENCOUNTER — Encounter (HOSPITAL_COMMUNITY): Payer: Self-pay | Admitting: Emergency Medicine

## 2019-10-21 ENCOUNTER — Emergency Department (HOSPITAL_COMMUNITY): Payer: Medicare Other

## 2019-10-21 ENCOUNTER — Encounter: Payer: Self-pay | Admitting: Emergency Medicine

## 2019-10-21 ENCOUNTER — Encounter: Payer: Self-pay | Admitting: Family Medicine

## 2019-10-21 ENCOUNTER — Ambulatory Visit (INDEPENDENT_AMBULATORY_CARE_PROVIDER_SITE_OTHER)
Admission: EM | Admit: 2019-10-21 | Discharge: 2019-10-21 | Disposition: A | Discharge status: Home / Self Care | Payer: Medicare Other | Source: Home / Self Care

## 2019-10-21 ENCOUNTER — Other Ambulatory Visit: Payer: Self-pay

## 2019-10-21 ENCOUNTER — Ambulatory Visit (INDEPENDENT_AMBULATORY_CARE_PROVIDER_SITE_OTHER): Payer: Medicare Other | Admitting: Family Medicine

## 2019-10-21 ENCOUNTER — Emergency Department (HOSPITAL_COMMUNITY)
Admission: EM | Admit: 2019-10-21 | Discharge: 2019-10-21 | Disposition: A | Discharge status: Home / Self Care | Payer: Medicare Other | Attending: Emergency Medicine | Admitting: Emergency Medicine

## 2019-10-21 VITALS — Temp 99.1°F | Ht 72.0 in | Wt 330.0 lb

## 2019-10-21 DIAGNOSIS — R7981 Abnormal blood-gas level: Secondary | ICD-10-CM

## 2019-10-21 DIAGNOSIS — N289 Disorder of kidney and ureter, unspecified: Secondary | ICD-10-CM | POA: Diagnosis not present

## 2019-10-21 DIAGNOSIS — R531 Weakness: Secondary | ICD-10-CM | POA: Insufficient documentation

## 2019-10-21 DIAGNOSIS — U071 COVID-19: Secondary | ICD-10-CM

## 2019-10-21 DIAGNOSIS — Z96659 Presence of unspecified artificial knee joint: Secondary | ICD-10-CM | POA: Insufficient documentation

## 2019-10-21 DIAGNOSIS — Z20828 Contact with and (suspected) exposure to other viral communicable diseases: Secondary | ICD-10-CM | POA: Insufficient documentation

## 2019-10-21 DIAGNOSIS — Z79899 Other long term (current) drug therapy: Secondary | ICD-10-CM | POA: Insufficient documentation

## 2019-10-21 DIAGNOSIS — Z7901 Long term (current) use of anticoagulants: Secondary | ICD-10-CM | POA: Diagnosis not present

## 2019-10-21 DIAGNOSIS — I482 Chronic atrial fibrillation, unspecified: Secondary | ICD-10-CM | POA: Diagnosis not present

## 2019-10-21 DIAGNOSIS — R0602 Shortness of breath: Secondary | ICD-10-CM

## 2019-10-21 DIAGNOSIS — R509 Fever, unspecified: Secondary | ICD-10-CM

## 2019-10-21 DIAGNOSIS — J9601 Acute respiratory failure with hypoxia: Secondary | ICD-10-CM | POA: Insufficient documentation

## 2019-10-21 DIAGNOSIS — J989 Respiratory disorder, unspecified: Secondary | ICD-10-CM

## 2019-10-21 DIAGNOSIS — R52 Pain, unspecified: Secondary | ICD-10-CM

## 2019-10-21 DIAGNOSIS — I509 Heart failure, unspecified: Secondary | ICD-10-CM | POA: Diagnosis not present

## 2019-10-21 DIAGNOSIS — E860 Dehydration: Secondary | ICD-10-CM

## 2019-10-21 DIAGNOSIS — I2510 Atherosclerotic heart disease of native coronary artery without angina pectoris: Secondary | ICD-10-CM | POA: Diagnosis not present

## 2019-10-21 DIAGNOSIS — I110 Hypertensive heart disease with heart failure: Secondary | ICD-10-CM | POA: Insufficient documentation

## 2019-10-21 DIAGNOSIS — R05 Cough: Secondary | ICD-10-CM

## 2019-10-21 DIAGNOSIS — R059 Cough, unspecified: Secondary | ICD-10-CM

## 2019-10-21 LAB — URINALYSIS, ROUTINE W REFLEX MICROSCOPIC
Bilirubin Urine: NEGATIVE
Glucose, UA: NEGATIVE mg/dL
Hgb urine dipstick: NEGATIVE
Ketones, ur: NEGATIVE mg/dL
Leukocytes,Ua: NEGATIVE
Nitrite: NEGATIVE
Protein, ur: NEGATIVE mg/dL
Specific Gravity, Urine: 1.017 (ref 1.005–1.030)
pH: 5 (ref 5.0–8.0)

## 2019-10-21 LAB — CBC WITH DIFFERENTIAL/PLATELET
Abs Immature Granulocytes: 0.09 10*3/uL — ABNORMAL HIGH (ref 0.00–0.07)
Basophils Absolute: 0.1 10*3/uL (ref 0.0–0.1)
Basophils Relative: 0 %
Eosinophils Absolute: 0 10*3/uL (ref 0.0–0.5)
Eosinophils Relative: 0 %
HCT: 42.3 % (ref 39.0–52.0)
Hemoglobin: 13.8 g/dL (ref 13.0–17.0)
Immature Granulocytes: 1 %
Lymphocytes Relative: 3 %
Lymphs Abs: 0.5 10*3/uL — ABNORMAL LOW (ref 0.7–4.0)
MCH: 29.4 pg (ref 26.0–34.0)
MCHC: 32.6 g/dL (ref 30.0–36.0)
MCV: 90 fL (ref 80.0–100.0)
Monocytes Absolute: 0.7 10*3/uL (ref 0.1–1.0)
Monocytes Relative: 4 %
Neutro Abs: 16 10*3/uL — ABNORMAL HIGH (ref 1.7–7.7)
Neutrophils Relative %: 92 %
Platelets: 252 10*3/uL (ref 150–400)
RBC: 4.7 MIL/uL (ref 4.22–5.81)
RDW: 13.9 % (ref 11.5–15.5)
WBC: 17.3 10*3/uL — ABNORMAL HIGH (ref 4.0–10.5)
nRBC: 0 % (ref 0.0–0.2)

## 2019-10-21 LAB — COMPREHENSIVE METABOLIC PANEL
ALT: 21 U/L (ref 0–44)
AST: 28 U/L (ref 15–41)
Albumin: 3.8 g/dL (ref 3.5–5.0)
Alkaline Phosphatase: 78 U/L (ref 38–126)
Anion gap: 13 (ref 5–15)
BUN: 30 mg/dL — ABNORMAL HIGH (ref 8–23)
CO2: 24 mmol/L (ref 22–32)
Calcium: 8.7 mg/dL — ABNORMAL LOW (ref 8.9–10.3)
Chloride: 98 mmol/L (ref 98–111)
Creatinine, Ser: 2.04 mg/dL — ABNORMAL HIGH (ref 0.61–1.24)
GFR calc Af Amer: 38 mL/min — ABNORMAL LOW (ref >60)
GFR calc non Af Amer: 33 mL/min — ABNORMAL LOW (ref >60)
Glucose, Bld: 139 mg/dL — ABNORMAL HIGH (ref 70–99)
Potassium: 4.3 mmol/L (ref 3.5–5.1)
Sodium: 135 mmol/L (ref 135–145)
Total Bilirubin: 1.2 mg/dL (ref 0.3–1.2)
Total Protein: 7.8 g/dL (ref 6.5–8.1)

## 2019-10-21 LAB — POC SARS CORONAVIRUS 2 AG -  ED: SARS Coronavirus 2 Ag: NEGATIVE

## 2019-10-21 LAB — SARS CORONAVIRUS 2 (TAT 6-24 HRS): SARS Coronavirus 2: NEGATIVE

## 2019-10-21 MED ORDER — SODIUM CHLORIDE 0.9 % IV BOLUS
500.0000 mL | Freq: Once | INTRAVENOUS | Status: AC
  Administered 2019-10-21: 14:00:00 500 mL via INTRAVENOUS

## 2019-10-21 NOTE — Discharge Instructions (Addendum)
Your weakness appears to be caused by dehydration which has increased your creatinine level.  This indicates that you need to stop taking your bumetanide, and spironolactone, for 3 days.  Restart them on Sunday.  Check with your doctor next week to make sure your blood pressure and heart rate are improved.  Return here, if needed, for problems.

## 2019-10-21 NOTE — ED Triage Notes (Signed)
-  Pt arrived by EMS from [facility] -+Reports generalized weakness X 2days -Reports SOB with exertion; Diaphoresis as "normal for me" -Takes Carvedilol and Eliquis

## 2019-10-21 NOTE — ED Notes (Signed)
This RN attempted for IV without success, EMS arrived at time for second attempt, and will leave for them to attempt

## 2019-10-21 NOTE — Progress Notes (Signed)
Telehealth office visit note for Joel Dominguez, D.O- at Primary Care at [facility]   I connected with current patient today and verified that I am speaking with the correct person using two identifiers.   . Location of the patient: Home . Location of the provider: Office Only the patient (+/- their family members at pt's discretion) and myself were participating in the encounter - This visit type was conducted due to national recommendations for restrictions regarding the COVID-19 Pandemic (e.g. social distancing) in an effort to limit this patient's exposure and mitigate transmission in our community.  This format is felt to be most appropriate for this patient at this time.   - The patient did not have access to video technology or had technical difficulties with video requiring transitioning to audio format only. - No physical exam could be performed with this format, beyond that communicated to [place] by the patient/ family members as noted.   - Additionally my office staff/ schedulers discussed with the patient that there may be a monetary charge related to this service, depending on their medical insurance.   The patient expressed understanding, and agreed to proceed.       History of Present Illness:  Fever, Fatigue, and Shortness of Breath   I, Joel Dominguez, am serving as scribe for Dr. Mellody Dominguez.   He has not been out and about during the season; notes "I haven't gone anywhere."  States he and his wife have been good about wearing their masks and staying at home.  HPI Acute URI (Fever, Fatigue, SOB): Patient notes "I've had better days.  I think my sinuses are acting up."  Symptoms started about a week ago.  Notes it "was like a cold, like getting a cold" to start out with.  He did not start out with a fever; started with runny nose.  Per wife, says "his sinuses haven't been normal since we moved to [place], and he's been sneezing all along."    Wife  notes "yesterday is when he started the fever, and then he got real weak, hard for him to mobilize."  Wife notes part of her is worried, but part of her says "I just don't know.  I don't think he needs an ambulance to go to the hospital at this point."  Yesterday he had a temperature of 101.  His fevers, productive cough, and shortness of breath first started yesterday.  Per patient, "I get shortness of breath a lot," even when he gets sinus infections.  Denies one-sided face pain or ear pain.  Has tried: Taking cough medicine, Mucinex.  Notes "trying to clean all that mucus out of me."  Says "it seemed like it was working" at first.  He would blow his nose and "sometimes it would be clear, sometimes it would be greenish."  He has a pulse oximeter at home, notes "yesterday measuring between 95 and 97."  Patient does not wish to go to the hospital if needed because he does not wish to catch something there.    GAD 7 : Generalized Anxiety Score 03/09/2019  Nervous, Anxious, on Edge 0  Control/stop worrying 0  Worry too much - different things 0  Trouble relaxing 0  Restless 0  Easily annoyed or irritable 1  Afraid - awful might happen 0  Total GAD 7 Score 1  Anxiety Difficulty Not difficult at all    Depression screen [facility] 2/9 10/21/2019 08/05/2019 03/09/2019 02/23/2019 04/30/2018  Decreased Interest 0 0 0 0 0  Down, Depressed, Hopeless 0 0 0 0 0  PHQ - 2 Score 0 0 0 0 0  Altered sleeping 0 0 0 0 0  Tired, decreased energy 1 1 3  0 0  Change in appetite 0 0 0 0 0  Feeling bad or failure about yourself  0 0 0 0 0  Trouble concentrating 0 0 0 0 0  Moving slowly or fidgety/restless 0 0 0 0 0  Suicidal thoughts 0 0 0 0 0  PHQ-9 Score 1 1 3  0 0  Difficult doing work/chores Not difficult at all Not difficult at all Not difficult at all Not difficult at all Not difficult at all  Some recent data might be hidden      Impression and Recommendations:    1. Clinical diagnosis of COVID-19     2. Fever, unspecified fever cause   3. Borderline low oxygen saturation level   4. SOB (shortness of breath)   5. acute Weakness generalized   6. Cough   7. Generalized body aches   8. Febrile respiratory illness      Clinical diagnosis of COVID-19; Fever, SOB, Febrile Respiratory Illness,  Cough, Generalized Body Aches, Acute Generalized Weakness - Viral vs Allergic vs Bacterial causes for pt's symptoms reveiwed.  - Education provided and all questions answered.  Lengthy conversation held. - Advised patient to obtain a COVID test ASAP today. - Discussed that if patient does have COVID-19, symptoms can take a turn for the worse very quickly.  - Strongly recommended going to Urgent Care/ED for immediate assessment. - Told patient and wife to be honest with providers regarding pt's symptoms. - concerning that pt feeling weaker and weaker and it is now difficult to ambulate. - Reviewed that if oxygen saturation continues to drop, down to 93, 92, 91, patient needs emergency care.  Discussed critical importance of keeping a close eye on patient's symptoms and dialing 911 if patient is too weak to move to the car.  - Novel Covid -19 counseling done; all questions were answered.   - Current CDC / federal and [place] guidelines reviewed with patient.  - Supportive care and various OTC medications discussed in addition to any prescribed.   - Reminded pt of extreme importance of social distancing; wearing a mask when out in public; insensate handwashing and cleaning of surfaces, avoiding unnecessary trips for shopping and avoiding ALL but emergency appts etc. - Told patient to be prepared, not scared; and be smart for the sake of others - Patient will call with any additional concerns.  - As part of my medical decision making, I reviewed the following data within the [place] History obtained from pt /family, CMA notes reviewed and incorporated if applicable, Labs  reviewed, Radiograph/ tests reviewed if applicable and OV notes from prior OV's with me, as well as other specialists she/he has seen since seeing me last, were all reviewed and used in my medical decision making process today.    - Additionally, discussion had with patient regarding our treatment plan, and their biases/concerns about that plan were used in my medical decision making today.    - The patient agreed with the plan and demonstrated an understanding of the instructions.   No barriers to understanding were identified.    - Red flag symptoms and signs discussed in detail.  Patient expressed understanding regarding what to do in case of emergency\ urgent symptoms.   - The  patient was advised to call back or seek an in-person evaluation if the symptoms worsen or if the condition fails to improve as anticipated.   Return if symptoms worsen or fail to improve, for F-up of current med issues as previously d/c pt.    I provided 20+ minutes of non face-to-face time during this encounter.  Additional time was spent with charting and coordination of care before and after the actual visit commenced.   Note:  This note was prepared with assistance of Dragon voice recognition software. Occasional wrong-word or sound-a-like substitutions may have occurred due to the inherent limitations of voice recognition software.  This document serves as a record of services personally performed by Joel Dance, DO. It was created on her behalf by Joel Dominguez, a trained medical scribe. The creation of this record is based on the scribe's personal observations and the provider's statements to them.   This case required medical decision making of at least moderate complexity. The above documentation has been reviewed to be accurate and was completed by Marjory Sneddon, D.O.       Patient Care Team    Relationship Specialty Notifications Start End  Joel Dance, DO PCP - General Family  Medicine  10/08/16   Lucia Bitter., MD Physician Assistant Pain Medicine  11/13/17    Comment: NP Darlin Coco, MD Consulting Physician Cardiology  11/13/17   Edrick Kins, DPM Consulting Physician Podiatry  02/23/19    Comment: provider:  Dr. Prudence Davidson      -Vitals obtained; medications/ allergies reconciled;  personal medical, social, Sx etc.histories were updated by CMA, reviewed by me and are reflected in chart   Patient Active Problem List   Diagnosis Date Noted  . Hypertensive heart disease with heart failure ([place]) 11/10/2017  . Sleep apnea with use of continuous positive airway pressure (CPAP) 10/08/2016  . Pulmonary hypertension ([place]) 10/08/2016  . Obesity, Class III, BMI 40-49.9 (morbid obesity) ([place]) 10/08/2016  . Positive for microalbuminuria 11/19/2017  . h/o Stroke 10/08/2016  . Atherosclerotic heart disease of native coronary artery with unspecified angina pectoris ([place]) 10/08/2016  . Chronic combined systolic and diastolic heart failure ([place]) 10/08/2016  . Chronic atrial fibrillation ([place]) 10/08/2016  . Chronic anticoagulation 10/08/2016  . Generalized OA 10/08/2016  . Foot drop- R 10/08/2016  . Lumbar spinal stenosis 10/08/2016  . Cellulitis 10/24/2019  . Chronic foot ulcer ([place]) 08/08/2019  . Prediabetes 08/08/2019  . Left leg weakness 08/08/2019  . High risk medication use 08/08/2019  . Chronic ulcer of toe ([place]) 08/04/2019  . Pain due to onychomycosis of toenails of both feet 04/30/2019  . Coagulation disorder ([place]) 04/30/2019  . Chronic bilateral low back pain with right-sided sciatica 03/03/2018  . Lumbosacral radiculopathy 01/22/2018  . Osteoarthritis 11/19/2017  . Vitamin D deficiency 11/19/2017  . 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 fascicular block) 11/07/2017  . Aortic stenosis 11/06/2017  . H/O noncompliance with medical treatment, presenting hazards to health 10/22/2017     Current Meds  Medication Sig  .  apixaban (ELIQUIS) 5 MG TABS tablet Take 1 tablet (5 mg total) by mouth 2 (two) times daily.  . bumetanide (BUMEX) 2 MG tablet TAKE 1 TABLET BY MOUTH 2 TIMES DAILY. (Patient taking differently: Take 2 mg by mouth 2 (two) times daily. )  . carvedilol (COREG) 25 MG tablet TAKE 1 TABLET BY MOUTH 2 TIMES DAILY WITH A MEAL. (Patient taking differently: Take 25 mg by  mouth 2 (two) times daily with a meal. )  . HYDROcodone-acetaminophen (NORCO) 10-325 MG tablet Take 1 tablet by mouth as needed for moderate pain.   . Multiple Vitamin (MULTIVITAMIN) tablet Take 1 tablet by mouth daily.  . potassium chloride (K-DUR) 10 MEQ tablet TAKE 2 TABLETS BY MOUTH 2 TIMES DAILY. (Patient taking differently: Take 10 mEq by mouth 2 (two) times daily. )  . spironolactone (ALDACTONE) 25 MG tablet Take 1 tablet (25 mg total) by mouth daily.  Marland Kitchen telmisartan (MICARDIS) 80 MG tablet TAKE 1 TABLET BY MOUTH DAILY. (Patient taking differently: Take 80 mg by mouth daily. )  . Vitamin D, Ergocalciferol, (DRISDOL) 1.25 MG (50000 UT) CAPS capsule TAKE 1 CAPSULE BY MOUTH EVERY 7 DAYS. (Patient taking differently: Take 50,000 Units by mouth every 7 (seven) days. )     Allergies:  Allergies  Allergen Reactions  . Cephalexin Rash     ROS:  See above HPI for pertinent positives and negatives   Objective:   Temperature 99.1 F (37.3 C), height 6' (1.829 m), weight (!) 330 lb (149.7 kg).  (if some vitals are omitted, this means that patient was UNABLE to obtain them even though they were asked to get them prior to OV today.  They were asked to call us at their earliest convenience with these once obtained. )  General: A & O * 3; sounds in no acute distress; in usual state of health.  Skin: Pt confirms warm and dry extremities and pink fingertips HEENT: Pt confirms lips non-cyanotic Chest: Patient confirms normal chest excursion and movement Respiratory: speaking in full sentences, no conversational dyspnea; patient confirms no  use of accessory muscles Psych: insight appears good, mood- appears full

## 2019-10-21 NOTE — ED Provider Notes (Signed)
[facility] EMERGENCY DEPARTMENT Provider Note   CSN: [id number] Arrival date & time: 10/21/19  1150     History Chief Complaint  Patient presents with  . Atrial Fibrillation    Joel Dominguez is a 68 y.o. male.  HPI He presents for evaluation of weakness which makes it difficult to walk.  He presented to the urgent care and they directed him here for further evaluation.  While at the urgent care he was screened for Covid with an antigen test which was negative.  He reports weakness, present for 2 days.  He denies fever, chills, nausea, vomiting, change in bowel or urinary habits.  He complains of "sinus congestion year-round, causing me to cough."  He is taking his usual prescribed medications.  No known sick contacts.  There are no other known modifying factors.    Past Medical History:  Diagnosis Date  . A-fib- couple yrs now. 10/08/2016  . Aortic stenosis 11/06/2017  . Atherosclerotic heart disease of native coronary artery with unspecified angina pectoris ([place]) 10/08/2016  . CHF (congestive heart failure) ([place])   . Chronic anticoagulation 10/08/2016  . Foot drop- R 10/08/2016   Patient known foot drop for many years due to lumbar spinal stenosis.-Over the past couple of months symptoms have gotten worse.  He feels he is tripping more and cannot lift his foot up as much. Toes are curling and calluses on end of toes.    - Did see spinal surgeon many yrs ago.  Declined sx at that time.     . Generalized OA 10/08/2016   Ortho doc-  Dr. [facility] in [place]- both knees replaced 08, 09.     . H/O noncompliance with medical treatment, presenting hazards to health 10/22/2017  . Heart disease   . Hypertension   . Obesity, Class III, BMI 40-49.9 (morbid obesity) ([place]) 10/08/2016  . OSA (obstructive sleep apnea) 10/08/2016   Bi-PAP nitely- been 20+ yrs now  . Pulmonary hypertension ([place]) 10/08/2016  . Spinal stenosis, lumbar region, with neurogenic claudication 10/08/2016   Back specialist in [place]-    only surgeon he would use- pt declined referral here for back pain mgt    . Stroke [facility])     Patient Active Problem List   Diagnosis Date Noted  . Chronic foot ulcer ([place]) 08/08/2019  . Prediabetes 08/08/2019  . Left leg weakness 08/08/2019  . High risk medication use 08/08/2019  . Chronic ulcer of toe ([place]) 08/04/2019  . Pain due to onychomycosis of toenails of both feet 04/30/2019  . Coagulation disorder ([place]) 04/30/2019  . Chronic bilateral low back pain with right-sided sciatica 03/03/2018  . Lumbosacral radiculopathy 01/22/2018  . Osteoarthritis 11/19/2017  . Vitamin D deficiency 11/19/2017  . Positive for microalbuminuria 11/19/2017  . Hypertensive heart disease with heart failure ([place]) 11/10/2017  . 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 fascicular block) 11/07/2017  . Aortic stenosis 11/06/2017  . H/O noncompliance with medical treatment, presenting hazards to health 10/22/2017  . h/o Stroke 10/08/2016  . Atherosclerotic heart disease of native coronary artery with unspecified angina pectoris ([place]) 10/08/2016  . Sleep apnea with use of continuous positive airway pressure (CPAP) 10/08/2016  . Chronic combined systolic and diastolic heart failure ([place]) 10/08/2016  . Generalized OA 10/08/2016  . Foot drop- R 10/08/2016  . Lumbar spinal stenosis 10/08/2016  . Pulmonary hypertension ([place]) 10/08/2016  . Chronic atrial fibrillation ([place]) 10/08/2016  . Chronic anticoagulation 10/08/2016  . Obesity, Class III, BMI  40-49.9 (morbid obesity) ([place]) 10/08/2016    Past Surgical History:  Procedure Laterality Date  . REPLACEMENT TOTAL KNEE Bilateral   . TIBIA FRACTURE SURGERY         Family History  Problem Relation Age of Onset  . Congestive Heart Failure Mother   . Hypertension Mother   . Diabetes Mother   . Cancer Father        lung  . Congestive Heart Failure Sister   . Hypertension Sister   . Hypertension Brother   . Hypertension  Sister   . Diabetes Brother     Social History   Tobacco Use  . Smoking status: Never Smoker  . Smokeless tobacco: Never Used  Substance Use Topics  . Alcohol use: Not Currently  . Drug use: No    Home Medications Prior to Admission medications   Medication Sig Start Date End Date Taking? Authorizing Provider  apixaban (ELIQUIS) 5 MG TABS tablet Take 1 tablet (5 mg total) by mouth 2 (two) times daily. 04/13/19   Richardo Priest, MD  bumetanide (BUMEX) 2 MG tablet TAKE 1 TABLET BY MOUTH 2 TIMES DAILY. 09/22/19   Richardo Priest, MD  carvedilol (COREG) 25 MG tablet TAKE 1 TABLET BY MOUTH 2 TIMES DAILY WITH A MEAL. 06/21/19   Richardo Priest, MD  HYDROcodone-acetaminophen (NORCO) 10-325 MG tablet Take 1 tablet by mouth as needed. 07/15/17   [provider]  Multiple Vitamin (MULTIVITAMIN) tablet Take 1 tablet by mouth daily.    [provider]  potassium chloride (K-DUR) 10 MEQ tablet TAKE 2 TABLETS BY MOUTH 2 TIMES DAILY. 06/28/19   Richardo Priest, MD  spironolactone (ALDACTONE) 25 MG tablet Take 1 tablet (25 mg total) by mouth daily. 08/05/19   Opalski, Deborah, DO  telmisartan (MICARDIS) 80 MG tablet TAKE 1 TABLET BY MOUTH DAILY. 08/17/19   Richardo Priest, MD  Vitamin D, Ergocalciferol, (DRISDOL) 1.25 MG (50000 UT) CAPS capsule TAKE 1 CAPSULE BY MOUTH EVERY 7 DAYS. 03/19/19   Mellody Dance, DO    Allergies    Cephalexin  Review of Systems   Review of Systems  All other systems reviewed and are negative.   Physical Exam Updated Vital Signs BP (!) 141/84   Pulse (!) 102   Temp 98.1 F (36.7 C) (Oral)   Resp 18   SpO2 96%   Physical Exam Vitals and nursing note reviewed.  Constitutional:      General: He is not in acute distress.    Appearance: He is well-developed. He is obese. He is not ill-appearing, toxic-appearing or diaphoretic.  HENT:     Head: Normocephalic and atraumatic.     Right Ear: External ear normal.     Left Ear: External ear normal.       Nose: No congestion or rhinorrhea.  Eyes:     Conjunctiva/sclera: Conjunctivae normal.     Pupils: Pupils are equal, round, and reactive to light.  Neck:     Trachea: Phonation normal.  Cardiovascular:     Rate and Rhythm: Normal rate and regular rhythm.     Heart sounds: Normal heart sounds.  Pulmonary:     Effort: Pulmonary effort is normal. No respiratory distress.     Breath sounds: Normal breath sounds. No stridor. No rhonchi.  Abdominal:     General: There is no distension.     Palpations: Abdomen is soft.     Tenderness: There is no abdominal tenderness.  Musculoskeletal:  General: Normal range of motion.     Cervical back: Normal range of motion and neck supple.  Skin:    General: Skin is warm and dry.  Neurological:     Mental Status: He is alert and oriented to person, place, and time.     Cranial Nerves: No cranial nerve deficit.     Sensory: No sensory deficit.     Motor: No abnormal muscle tone.     Coordination: Coordination normal.  Psychiatric:        Mood and Affect: Mood normal.        Behavior: Behavior normal.        Thought Content: Thought content normal.        Judgment: Judgment normal.     ED Results / Procedures / Treatments   Labs (all labs ordered are listed, but only abnormal results are displayed) Labs Reviewed  COMPREHENSIVE METABOLIC PANEL - Abnormal; Notable for the following components:      Result Value   Glucose, Bld 139 (*)    BUN 30 (*)    Creatinine, Ser 2.04 (*)    Calcium 8.7 (*)    GFR calc non Af Amer 33 (*)    GFR calc Af Amer 38 (*)    All other components within normal limits  CBC WITH DIFFERENTIAL/PLATELET - Abnormal; Notable for the following components:   WBC 17.3 (*)    Neutro Abs 16.0 (*)    Lymphs Abs 0.5 (*)    Abs Immature Granulocytes 0.09 (*)    All other components within normal limits  URINALYSIS, ROUTINE W REFLEX MICROSCOPIC - Abnormal; Notable for the following components:   Color, Urine  AMBER (*)    APPearance HAZY (*)    All other components within normal limits  SARS CORONAVIRUS 2 (TAT 6-24 HRS)    EKG None  Radiology DG Chest 2 View  Result Date: 10/21/2019 CLINICAL DATA:  Shortness of breath and weakness for the past 2 days. EXAM: CHEST - 2 VIEW COMPARISON:  Chest x-ray dated April 15, 2019. FINDINGS: The heart is at the upper limits of normal in size. Atherosclerotic calcification of the aortic arch. Normal pulmonary vascularity. No focal consolidation, pleural effusion, or pneumothorax. No acute osseous abnormality. IMPRESSION: No active cardiopulmonary disease. Electronically Signed   By: Titus Dubin M.D.   On: 10/21/2019 13:30    Procedures Procedures (including critical care time)  Medications Ordered in ED Medications  sodium chloride 0.9 % bolus 500 mL (0 mLs Intravenous Stopped 10/21/19 1604)    ED Course  I have reviewed the triage vital signs and the nursing notes.  Pertinent labs & imaging results that were available during my care of the patient were reviewed by me and considered in my medical decision making (see chart for details).  Clinical Course as of Oct 20 1629  Thu Oct 21, 2019  1621 Normal except white count high  CBC with Differential(!) [EW]  1621 Normal except glucose high, BUN high, creatinine high, calcium low, GFR low  Comprehensive metabolic panel(!) [EW]  0000000 Normal  Urinalysis, Routine w reflex microscopic(!) [EW]    Clinical Course User Index [EW] Daleen Bo, MD   MDM Rules/Calculators/A&P     CHA2DS2-VASc Score: 5                  Patient Vitals for the past 24 hrs:  BP Temp Temp src Pulse Resp SpO2  10/21/19 1604 (!) 141/84 -- -- (!) 102 18 96 %  10/21/19 1300 131/81 -- -- 100 16 92 %  10/21/19 1245 119/72 -- -- (!) 110 16 96 %  10/21/19 1200 129/76 -- -- (!) 104 15 96 %  10/21/19 1155 131/61 98.1 F (36.7 C) Oral (!) 115 14 95 %    4:26 PM Reevaluation with update and discussion. After initial  assessment and treatment, an updated evaluation reveals he states he feels some better now and is hungry and thirsty.  Findings discussed with the patient and all questions were answered. Daleen Bo   Medical Decision Making: General weakness, nonspecific, associated with elevated creatinine, from baseline.  He is on both Bumex, and Aldactone, and appears depleted/dehydrated.  Patient has not had vomiting or diarrhea.  Stable chronic atrial fibrillation, currently anticoagulated.  Mild tachycardia related to dehydration.  Doubt serious bacterial infection or impending vascular collapse.  Deovion Pioli was evaluated in Emergency Department on 10/21/2019 for the symptoms described in the history of present illness. He was evaluated in the context of the global COVID-19 pandemic, which necessitated consideration that the patient might be at risk for infection with the SARS-CoV-2 virus that causes COVID-19. Institutional protocols and algorithms that pertain to the evaluation of patients at risk for COVID-19 are in a state of rapid change based on information released by regulatory bodies including the CDC and federal and state organizations. These policies and algorithms were followed during the patient's care in the ED.   CRITICAL CARE-no Performed by: Daleen Bo   Nursing Notes Reviewed/ Care Coordinated Applicable Imaging Reviewed Interpretation of Laboratory Data incorporated into ED treatment  The patient appears reasonably screened and/or stabilized for discharge and I doubt any other medical condition or other [facility] requiring further screening, evaluation, or treatment in the ED at this time prior to discharge.  Plan: Home Medications-hold Aldactone and Bumex for 3 days, then restart and take his usual, continue other medications; Home Treatments-rest, fluids per usual; return here if the recommended treatment, does not improve the symptoms; Recommended follow up-PCP checkup next  week  Final Clinical Impression(s) / ED Diagnoses Final diagnoses:  Dehydration  Renal insufficiency    Rx / DC Orders ED Discharge Orders    None       Daleen Bo, MD 10/21/19 1630

## 2019-10-21 NOTE — ED Provider Notes (Signed)
68 year old male with history of chronic A. fib on Eliquis, CAD, CHF, HTN, OSA on BiPAP, pulmonary hypertension comes in for 2-day history of generalized weakness.  He is normally able to ambulate with a cane at baseline, but has not been able to ambulate on own for the past 2 days.  Denies head injury, loss of consciousness.  Denies one-sided weakness, dizziness, syncope, slurred speech, aphasia.  Denies chest pain, palpitation.  States he has dyspnea on exertion at baseline, that has not worsened since symptom onset.  He also at baseline has diaphoresis, which has not worsened.  Has nasal congestion, intermittent headache that is also at baseline for him, stating it is as a result of using BiPAP.  Mild cough at times.  Denies nausea, vomiting, diarrhea.  Patient was brought in office with wheelchair.  At triage, BP 143/81, HR 115, temp 97.54F, RR 20, O2 90% at room air Patient is sitting comfortably in wheelchair, no acute distress, nontoxic in appearance.  However, he is diaphoretic, cold and clammy, though he states is at baseline. Unable to ambulate on own. Heart: irregular irregular, tachycardic Lungs: LCTAB  EKG A. fib with rapid ventricular response, 113 bpm, right bundle branch block, left anterior fascicular block, no obvious ST elevation.  Rapid COVID: NEGATIVE Given history and exam, patient transported via EMS to ED for further evaluation and management needed.   Ok Edwards, PA-C 10/21/19 1122

## 2019-10-21 NOTE — ED Notes (Signed)
Patient verbalizes understanding of discharge instructions. Opportunity for questioning and answers were provided. Armband removed by staff, pt discharged from ED with wife.   

## 2019-10-21 NOTE — ED Triage Notes (Signed)
Pt presents to [facility] for assessment of generalized weakness, intensification of his normal SOB, diaphoresis.  Patient states yesterday her was so weak he could not stand.  PT states his pulse ox was down to 90% last night, "but then it came back up today".  C/o intermittent headache.  Patient states all symptoms started yesterday.  Denies chest pain, c/o lower back pain which is historical for him.  Denies known exposure to COVID positive patients.  Pt c/o nasal congestion, which he states he has had for 5 years.  C/o mild cough to clear his throat.  Denies n/v/d.

## 2019-10-23 ENCOUNTER — Encounter: Payer: Self-pay | Admitting: Family Medicine

## 2019-10-24 ENCOUNTER — Other Ambulatory Visit: Payer: Self-pay

## 2019-10-24 ENCOUNTER — Emergency Department (HOSPITAL_COMMUNITY): Payer: Medicare Other

## 2019-10-24 ENCOUNTER — Encounter (HOSPITAL_COMMUNITY): Payer: Self-pay | Admitting: Internal Medicine

## 2019-10-24 ENCOUNTER — Inpatient Hospital Stay (HOSPITAL_COMMUNITY)
Admission: EM | Admit: 2019-10-24 | Discharge: 2019-11-11 | DRG: 264 | Disposition: A | Discharge status: Rehabilitation Facility | Payer: Medicare Other | Attending: Internal Medicine | Admitting: Internal Medicine

## 2019-10-24 DIAGNOSIS — M726 Necrotizing fasciitis: Secondary | ICD-10-CM | POA: Diagnosis present

## 2019-10-24 DIAGNOSIS — L0390 Cellulitis, unspecified: Secondary | ICD-10-CM | POA: Diagnosis not present

## 2019-10-24 DIAGNOSIS — Z6841 Body Mass Index (BMI) 40.0 and over, adult: Secondary | ICD-10-CM

## 2019-10-24 DIAGNOSIS — I4811 Longstanding persistent atrial fibrillation: Secondary | ICD-10-CM

## 2019-10-24 DIAGNOSIS — M62838 Other muscle spasm: Secondary | ICD-10-CM | POA: Diagnosis not present

## 2019-10-24 DIAGNOSIS — M21371 Foot drop, right foot: Secondary | ICD-10-CM | POA: Diagnosis present

## 2019-10-24 DIAGNOSIS — L03115 Cellulitis of right lower limb: Secondary | ICD-10-CM | POA: Diagnosis not present

## 2019-10-24 DIAGNOSIS — Z96653 Presence of artificial knee joint, bilateral: Secondary | ICD-10-CM | POA: Diagnosis present

## 2019-10-24 DIAGNOSIS — R079 Chest pain, unspecified: Secondary | ICD-10-CM | POA: Diagnosis not present

## 2019-10-24 DIAGNOSIS — Z833 Family history of diabetes mellitus: Secondary | ICD-10-CM

## 2019-10-24 DIAGNOSIS — N1831 Chronic kidney disease, stage 3a: Secondary | ICD-10-CM | POA: Diagnosis present

## 2019-10-24 DIAGNOSIS — L03116 Cellulitis of left lower limb: Secondary | ICD-10-CM | POA: Diagnosis not present

## 2019-10-24 DIAGNOSIS — N179 Acute kidney failure, unspecified: Secondary | ICD-10-CM | POA: Diagnosis present

## 2019-10-24 DIAGNOSIS — L89316 Pressure-induced deep tissue damage of right buttock: Secondary | ICD-10-CM | POA: Clinically undetermined

## 2019-10-24 DIAGNOSIS — R0602 Shortness of breath: Secondary | ICD-10-CM | POA: Diagnosis not present

## 2019-10-24 DIAGNOSIS — I130 Hypertensive heart and chronic kidney disease with heart failure and stage 1 through stage 4 chronic kidney disease, or unspecified chronic kidney disease: Secondary | ICD-10-CM | POA: Diagnosis present

## 2019-10-24 DIAGNOSIS — Z881 Allergy status to other antibiotic agents status: Secondary | ICD-10-CM

## 2019-10-24 DIAGNOSIS — D62 Acute posthemorrhagic anemia: Secondary | ICD-10-CM | POA: Diagnosis not present

## 2019-10-24 DIAGNOSIS — I951 Orthostatic hypotension: Secondary | ICD-10-CM | POA: Diagnosis not present

## 2019-10-24 DIAGNOSIS — I2510 Atherosclerotic heart disease of native coronary artery without angina pectoris: Secondary | ICD-10-CM | POA: Diagnosis present

## 2019-10-24 DIAGNOSIS — I4821 Permanent atrial fibrillation: Secondary | ICD-10-CM | POA: Diagnosis present

## 2019-10-24 DIAGNOSIS — I96 Gangrene, not elsewhere classified: Principal | ICD-10-CM | POA: Diagnosis present

## 2019-10-24 DIAGNOSIS — G4733 Obstructive sleep apnea (adult) (pediatric): Secondary | ICD-10-CM | POA: Diagnosis present

## 2019-10-24 DIAGNOSIS — I5042 Chronic combined systolic (congestive) and diastolic (congestive) heart failure: Secondary | ICD-10-CM

## 2019-10-24 DIAGNOSIS — I509 Heart failure, unspecified: Secondary | ICD-10-CM

## 2019-10-24 DIAGNOSIS — I110 Hypertensive heart disease with heart failure: Secondary | ICD-10-CM | POA: Diagnosis not present

## 2019-10-24 DIAGNOSIS — L89326 Pressure-induced deep tissue damage of left buttock: Secondary | ICD-10-CM | POA: Clinically undetermined

## 2019-10-24 DIAGNOSIS — Z20822 Contact with and (suspected) exposure to covid-19: Secondary | ICD-10-CM | POA: Diagnosis present

## 2019-10-24 DIAGNOSIS — I5033 Acute on chronic diastolic (congestive) heart failure: Secondary | ICD-10-CM | POA: Diagnosis not present

## 2019-10-24 DIAGNOSIS — Z7901 Long term (current) use of anticoagulants: Secondary | ICD-10-CM

## 2019-10-24 DIAGNOSIS — I472 Ventricular tachycardia: Secondary | ICD-10-CM | POA: Diagnosis not present

## 2019-10-24 DIAGNOSIS — L97519 Non-pressure chronic ulcer of other part of right foot with unspecified severity: Secondary | ICD-10-CM | POA: Diagnosis present

## 2019-10-24 DIAGNOSIS — Z801 Family history of malignant neoplasm of trachea, bronchus and lung: Secondary | ICD-10-CM

## 2019-10-24 DIAGNOSIS — I080 Rheumatic disorders of both mitral and aortic valves: Secondary | ICD-10-CM | POA: Diagnosis present

## 2019-10-24 DIAGNOSIS — D631 Anemia in chronic kidney disease: Secondary | ICD-10-CM | POA: Diagnosis present

## 2019-10-24 DIAGNOSIS — Z8673 Personal history of transient ischemic attack (TIA), and cerebral infarction without residual deficits: Secondary | ICD-10-CM

## 2019-10-24 DIAGNOSIS — Z79899 Other long term (current) drug therapy: Secondary | ICD-10-CM

## 2019-10-24 DIAGNOSIS — Z8249 Family history of ischemic heart disease and other diseases of the circulatory system: Secondary | ICD-10-CM

## 2019-10-24 DIAGNOSIS — I482 Chronic atrial fibrillation, unspecified: Secondary | ICD-10-CM

## 2019-10-24 DIAGNOSIS — I272 Pulmonary hypertension, unspecified: Secondary | ICD-10-CM | POA: Diagnosis present

## 2019-10-24 LAB — COMPREHENSIVE METABOLIC PANEL
ALT: 26 U/L (ref 0–44)
AST: 34 U/L (ref 15–41)
Albumin: 3 g/dL — ABNORMAL LOW (ref 3.5–5.0)
Alkaline Phosphatase: 70 U/L (ref 38–126)
Anion gap: 13 (ref 5–15)
BUN: 49 mg/dL — ABNORMAL HIGH (ref 8–23)
CO2: 24 mmol/L (ref 22–32)
Calcium: 8.5 mg/dL — ABNORMAL LOW (ref 8.9–10.3)
Chloride: 97 mmol/L — ABNORMAL LOW (ref 98–111)
Creatinine, Ser: 1.88 mg/dL — ABNORMAL HIGH (ref 0.61–1.24)
GFR calc Af Amer: 42 mL/min — ABNORMAL LOW (ref >60)
GFR calc non Af Amer: 36 mL/min — ABNORMAL LOW (ref >60)
Glucose, Bld: 135 mg/dL — ABNORMAL HIGH (ref 70–99)
Potassium: 4 mmol/L (ref 3.5–5.1)
Sodium: 134 mmol/L — ABNORMAL LOW (ref 135–145)
Total Bilirubin: 1.2 mg/dL (ref 0.3–1.2)
Total Protein: 7.6 g/dL (ref 6.5–8.1)

## 2019-10-24 LAB — CBC WITH DIFFERENTIAL/PLATELET
Abs Immature Granulocytes: 0.07 10*3/uL (ref 0.00–0.07)
Basophils Absolute: 0 10*3/uL (ref 0.0–0.1)
Basophils Relative: 0 %
Eosinophils Absolute: 0.1 10*3/uL (ref 0.0–0.5)
Eosinophils Relative: 1 %
HCT: 37.2 % — ABNORMAL LOW (ref 39.0–52.0)
Hemoglobin: 12.2 g/dL — ABNORMAL LOW (ref 13.0–17.0)
Immature Granulocytes: 0 %
Lymphocytes Relative: 6 %
Lymphs Abs: 1 10*3/uL (ref 0.7–4.0)
MCH: 29.3 pg (ref 26.0–34.0)
MCHC: 32.8 g/dL (ref 30.0–36.0)
MCV: 89.2 fL (ref 80.0–100.0)
Monocytes Absolute: 1.4 10*3/uL — ABNORMAL HIGH (ref 0.1–1.0)
Monocytes Relative: 8 %
Neutro Abs: 14.6 10*3/uL — ABNORMAL HIGH (ref 1.7–7.7)
Neutrophils Relative %: 85 %
Platelets: 232 10*3/uL (ref 150–400)
RBC: 4.17 MIL/uL — ABNORMAL LOW (ref 4.22–5.81)
RDW: 14.2 % (ref 11.5–15.5)
WBC: 17.1 10*3/uL — ABNORMAL HIGH (ref 4.0–10.5)
nRBC: 0 % (ref 0.0–0.2)

## 2019-10-24 LAB — HIV ANTIBODY (ROUTINE TESTING W REFLEX): HIV Screen 4th Generation wRfx: NONREACTIVE

## 2019-10-24 LAB — LACTIC ACID, PLASMA
Lactic Acid, Venous: 1.2 mmol/L (ref 0.5–1.9)
Lactic Acid, Venous: 1.2 mmol/L (ref 0.5–1.9)

## 2019-10-24 LAB — APTT: aPTT: 53 seconds — ABNORMAL HIGH (ref 24–36)

## 2019-10-24 LAB — PROTIME-INR
INR: 2 — ABNORMAL HIGH (ref 0.8–1.2)
Prothrombin Time: 22.8 seconds — ABNORMAL HIGH (ref 11.4–15.2)

## 2019-10-24 LAB — SARS CORONAVIRUS 2 (TAT 6-24 HRS): SARS Coronavirus 2: NEGATIVE

## 2019-10-24 MED ORDER — SODIUM CHLORIDE 0.9 % IV SOLN
2.0000 g | Freq: Three times a day (TID) | INTRAVENOUS | Status: DC
  Administered 2019-10-24 – 2019-10-25 (×2): 2 g via INTRAVENOUS
  Filled 2019-10-24 (×4): qty 2

## 2019-10-24 MED ORDER — BUMETANIDE 2 MG PO TABS
2.0000 mg | ORAL_TABLET | Freq: Two times a day (BID) | ORAL | Status: DC
  Administered 2019-10-24: 2 mg via ORAL
  Filled 2019-10-24 (×3): qty 1

## 2019-10-24 MED ORDER — VANCOMYCIN HCL IN DEXTROSE 1-5 GM/200ML-% IV SOLN
1000.0000 mg | Freq: Once | INTRAVENOUS | Status: DC
  Filled 2019-10-24: qty 200

## 2019-10-24 MED ORDER — SPIRONOLACTONE 25 MG PO TABS
25.0000 mg | ORAL_TABLET | Freq: Every day | ORAL | Status: DC
  Administered 2019-10-24: 18:00:00 25 mg via ORAL
  Filled 2019-10-24 (×2): qty 1

## 2019-10-24 MED ORDER — VANCOMYCIN HCL 10 G IV SOLR
2500.0000 mg | Freq: Once | INTRAVENOUS | Status: AC
  Administered 2019-10-24: 15:00:00 2500 mg via INTRAVENOUS
  Filled 2019-10-24: qty 2500

## 2019-10-24 MED ORDER — IRBESARTAN 300 MG PO TABS: 300.0000 mg | ORAL_TABLET | Freq: Every day | ORAL | Status: DC

## 2019-10-24 MED ORDER — HYDROMORPHONE HCL 2 MG PO TABS
1.0000 mg | ORAL_TABLET | ORAL | Status: DC | PRN
  Administered 2019-10-24 – 2019-10-25 (×2): 1 mg via ORAL
  Filled 2019-10-24 (×2): qty 1

## 2019-10-24 MED ORDER — POTASSIUM CHLORIDE ER 10 MEQ PO TBCR
10.0000 meq | EXTENDED_RELEASE_TABLET | Freq: Two times a day (BID) | ORAL | Status: DC
  Administered 2019-10-24: 18:00:00 10 meq via ORAL
  Filled 2019-10-24 (×4): qty 1

## 2019-10-24 MED ORDER — VANCOMYCIN HCL 1500 MG/300ML IV SOLN
1500.0000 mg | INTRAVENOUS | Status: DC
  Administered 2019-10-25: 15:00:00 1500 mg via INTRAVENOUS
  Filled 2019-10-24 (×2): qty 300

## 2019-10-24 MED ORDER — APIXABAN 5 MG PO TABS
5.0000 mg | ORAL_TABLET | Freq: Two times a day (BID) | ORAL | Status: DC
  Administered 2019-10-24 – 2019-10-30 (×12): 5 mg via ORAL
  Filled 2019-10-24 (×13): qty 1

## 2019-10-24 MED ORDER — MORPHINE SULFATE (PF) 2 MG/ML IV SOLN
2.0000 mg | Freq: Once | INTRAVENOUS | Status: AC
  Administered 2019-10-24: 13:00:00 2 mg via INTRAVENOUS
  Filled 2019-10-24: qty 1

## 2019-10-24 MED ORDER — CARVEDILOL 25 MG PO TABS
25.0000 mg | ORAL_TABLET | Freq: Two times a day (BID) | ORAL | Status: DC
  Administered 2019-10-24 – 2019-10-31 (×15): 25 mg via ORAL
  Filled 2019-10-24 (×15): qty 1

## 2019-10-24 MED ORDER — ACETAMINOPHEN 325 MG PO TABS
650.0000 mg | ORAL_TABLET | Freq: Four times a day (QID) | ORAL | Status: DC | PRN
  Administered 2019-10-25 – 2019-10-28 (×3): 650 mg via ORAL
  Filled 2019-10-24 (×3): qty 2

## 2019-10-24 MED ORDER — CYCLOBENZAPRINE HCL 5 MG PO TABS
5.0000 mg | ORAL_TABLET | Freq: Three times a day (TID) | ORAL | Status: DC | PRN
  Administered 2019-10-24 – 2019-11-02 (×3): 5 mg via ORAL
  Filled 2019-10-24 (×4): qty 1

## 2019-10-24 MED ORDER — SODIUM CHLORIDE 0.9 % IV SOLN
2.0000 g | Freq: Once | INTRAVENOUS | Status: AC
  Administered 2019-10-24: 2 g via INTRAVENOUS
  Filled 2019-10-24: qty 2

## 2019-10-24 NOTE — Consult Note (Signed)
WOC Nurse Consult Note: Patient receiving care in [facility] ED 40.  Consult completed remotely after review of record, including images of RLE. Reason for Consult: "Right leg wounds" Wound type: infectious; probable Strep cellulits per note from Dr. Celesta Gentile from today. Pressure Injury POA: Yes/No/NA Measurement: na Wound bed: see photos Drainage (amount, consistency, odor) serous Periwound: erythematous, edematous, severe pain, now extending to level of groin on the right Dressing procedure/placement/frequency:  Place as many Xeroform gauzes over the fluid filled or ruptured areas on the RLE, as necessary to cover all the areas. Top with ABD pads. Beginning behind the toes and going to just below the knee, spiral wrap kerlex then 4 inch Ace Wrap(s) to cover the entire area.  Change each shift. Monitor the wound area(s) for worsening of condition such as: Signs/symptoms of infection,  Increase in size,  Development of or worsening of odor, Development of pain, or increased pain at the affected locations.  Notify the medical team if any of these develop.  Thank you for the consult. [place] nurse will not follow at this time.  Please re-consult the [place] team if needed.  Val Riles, RN, MSN, CWOCN, CNS-BC, pager [phone]

## 2019-10-24 NOTE — H&P (Addendum)
History and Physical    Joel Dominguez [id number] DOB: 19-May-1951 DOA: 10/24/2019  PCP: Mellody Dance, DO   Patient coming from: Home  I have personally briefly reviewed patient's old medical records in [place]  Chief Complaint: Right leg rash and pain  HPI: Joel Dominguez is a 68 y.o. male with medical history significant of history of A. fib, aortic stenosis, atherosclerotic heart disease, diastolic CHF, moderate mitral regurgitation, chronic anticoagulation, morbid obesity presents today complaining of right leg pain with rash.    Came to ED on Thursday for feeling of lightheaded and was diagnosed with dehydration, all his diuresis has been held since Thursday, and he was told just to continue to use compression socks for the chronic bilateral leg swelling.  Friday morning he woke up and seen a new rash and a few blisters develop on the right leg below the knee, over 24 hours the rash and blister with pain, all became worse associated with pain but no fever or chills. Is now almost up to his groin. He denies any previous trauma to this leg and had not noted any injuries.  He denies headache, cough, nausea, vomiting, or diarrhea.  The pain in the leg is severe. ED Course: Significant elevation of WBC count, and was started on vancomycin and aztreonam (allergy to Keflex, but patient does not recall what his symptoms was)  Review of Systems: As per HPI otherwise 10 point review of systems negative.    Past Medical History:  Diagnosis Date  . A-fib- couple yrs now. 10/08/2016  . Aortic stenosis 11/06/2017  . Atherosclerotic heart disease of native coronary artery with unspecified angina pectoris ([place]) 10/08/2016  . CHF (congestive heart failure) ([place])   . Chronic anticoagulation 10/08/2016  . Foot drop- R 10/08/2016   Patient known foot drop for many years due to lumbar spinal stenosis.-Over the past couple of months symptoms have gotten worse.  He feels he is tripping more and  cannot lift his foot up as much. Toes are curling and calluses on end of toes.    - Did see spinal surgeon many yrs ago.  Declined sx at that time.     . Generalized OA 10/08/2016   Ortho doc-  Dr. [facility] in [place]- both knees replaced 08, 09.     . H/O noncompliance with medical treatment, presenting hazards to health 10/22/2017  . Heart disease   . Hypertension   . Obesity, Class III, BMI 40-49.9 (morbid obesity) ([place]) 10/08/2016  . OSA (obstructive sleep apnea) 10/08/2016   Bi-PAP nitely- been 20+ yrs now  . Pulmonary hypertension ([place]) 10/08/2016  . Spinal stenosis, lumbar region, with neurogenic claudication 10/08/2016   Back specialist in [place]-    only surgeon he would use- pt declined referral here for back pain mgt    . Stroke [facility])     Past Surgical History:  Procedure Laterality Date  . REPLACEMENT TOTAL KNEE Bilateral   . TIBIA FRACTURE SURGERY       reports that he has never smoked. He has never used smokeless tobacco. He reports previous alcohol use. He reports that he does not use drugs.  Allergies  Allergen Reactions  . Cephalexin Rash    Family History  Problem Relation Age of Onset  . Congestive Heart Failure Mother   . Hypertension Mother   . Diabetes Mother   . Cancer Father        lung  . Congestive Heart Failure Sister   . Hypertension  Sister   . Hypertension Brother   . Hypertension Sister   . Diabetes Brother      Prior to Admission medications   Medication Sig Start Date End Date Taking? Authorizing Provider  apixaban (ELIQUIS) 5 MG TABS tablet Take 1 tablet (5 mg total) by mouth 2 (two) times daily. 04/13/19  Yes Richardo Priest, MD  bumetanide (BUMEX) 2 MG tablet TAKE 1 TABLET BY MOUTH 2 TIMES DAILY. Patient taking differently: Take 2 mg by mouth 2 (two) times daily.  09/22/19  Yes Richardo Priest, MD  carvedilol (COREG) 25 MG tablet TAKE 1 TABLET BY MOUTH 2 TIMES DAILY WITH A MEAL. Patient taking differently: Take 25 mg by mouth 2 (two) times  daily with a meal.  06/21/19  Yes Munley, Hilton Cork, MD  HYDROcodone-acetaminophen (NORCO) 10-325 MG tablet Take 1 tablet by mouth as needed for moderate pain.  07/15/17  Yes [provider]  Multiple Vitamin (MULTIVITAMIN) tablet Take 1 tablet by mouth daily.   Yes [provider]  potassium chloride (K-DUR) 10 MEQ tablet TAKE 2 TABLETS BY MOUTH 2 TIMES DAILY. Patient taking differently: Take 10 mEq by mouth 2 (two) times daily.  06/28/19  Yes Richardo Priest, MD  spironolactone (ALDACTONE) 25 MG tablet Take 1 tablet (25 mg total) by mouth daily. 08/05/19  Yes Opalski, Deborah, DO  telmisartan (MICARDIS) 80 MG tablet TAKE 1 TABLET BY MOUTH DAILY. Patient taking differently: Take 80 mg by mouth daily.  08/17/19  Yes Richardo Priest, MD  Vitamin D, Cholecalciferol, 50 MCG (2000 UT) CAPS Take 50 mcg by mouth daily.   Yes [provider]  Vitamin D, Ergocalciferol, (DRISDOL) 1.25 MG (50000 UT) CAPS capsule TAKE 1 CAPSULE BY MOUTH EVERY 7 DAYS. Patient taking differently: Take 50,000 Units by mouth every 7 (seven) days.  03/19/19  Yes Mellody Dance, DO    Physical Exam: Vitals:   10/24/19 1139 10/24/19 1145 10/24/19 1146  BP: 114/60    Pulse: 89    Resp: (!) 31    Temp: 99.5 F (37.5 C)    TempSrc: Oral    SpO2: 96% 95%   Weight:   (!) 156.5 kg  Height:   6' (1.829 m)    Constitutional: NAD, calm, comfortable Vitals:   10/24/19 1139 10/24/19 1145 10/24/19 1146  BP: 114/60    Pulse: 89    Resp: (!) 31    Temp: 99.5 F (37.5 C)    TempSrc: Oral    SpO2: 96% 95%   Weight:   (!) 156.5 kg  Height:   6' (1.829 m)   Eyes: PERRL, lids and conjunctivae normal ENMT: Mucous membranes are moist. Posterior pharynx clear of any exudate or lesions.Normal dentition.  Neck: normal, supple, no masses, no thyromegaly Respiratory: clear to auscultation bilaterally, no wheezing, no crackles. Normal respiratory effort. No accessory muscle use.  Cardiovascular: Regular rate  and rhythm, soft systolic murmur on apex. 1+ pedal pulses on left and 2+ on right. No carotid bruits.  Abdomen: no tenderness, no masses palpated. No hepatosplenomegaly. Bowel sounds positive.  Musculoskeletal: no clubbing / cyanosis. No joint deformity upper and lower extremities. Good ROM, no contractures. Normal muscle tone.  Skin: Large area of erythema and few blisters and on the right leg below the knee, see pics Neurologic: CN 2-12 grossly intact. Sensation intact, DTR normal. Strength 5/5 in all 4.  Psychiatric: Normal judgment and insight. Alert and oriented x 3. Normal mood.  Labs on Admission: I have personally reviewed following labs and imaging studies  CBC: Recent Labs  Lab 10/21/19 1322 10/24/19 1300  WBC 17.3* 17.1*  NEUTROABS 16.0* 14.6*  HGB 13.8 12.2*  HCT 42.3 37.2*  MCV 90.0 89.2  PLT 252 [id number]   Basic Metabolic Panel: Recent Labs  Lab 10/21/19 1322 10/24/19 1300  NA 135 134*  K 4.3 4.0  CL 98 97*  CO2 24 24  GLUCOSE 139* 135*  BUN 30* 49*  CREATININE 2.04* 1.88*  CALCIUM 8.7* 8.5*   GFR: Estimated Creatinine Clearance: 58.1 mL/min (A) (by C-G formula based on SCr of 1.88 mg/dL (H)). Liver Function Tests: Recent Labs  Lab 10/21/19 1322 10/24/19 1300  AST 28 34  ALT 21 26  ALKPHOS 78 70  BILITOT 1.2 1.2  PROT 7.8 7.6  ALBUMIN 3.8 3.0*   No results for input(s): LIPASE, AMYLASE in the last 168 hours. No results for input(s): AMMONIA in the last 168 hours. Coagulation Profile: Recent Labs  Lab 10/24/19 1300  INR 2.0*   Cardiac Enzymes: No results for input(s): CKTOTAL, CKMB, CKMBINDEX, TROPONINI in the last 168 hours. BNP (last 3 results) No results for input(s): PROBNP in the last 8760 hours. HbA1C: No results for input(s): HGBA1C in the last 72 hours. CBG: No results for input(s): GLUCAP in the last 168 hours. Lipid Profile: No results for input(s): CHOL, HDL, LDLCALC, TRIG, CHOLHDL, LDLDIRECT in the last 72 hours.  Thyroid Function Tests: No results for input(s): TSH, T4TOTAL, FREET4, T3FREE, THYROIDAB in the last 72 hours. Anemia Panel: No results for input(s): VITAMINB12, FOLATE, FERRITIN, TIBC, IRON, RETICCTPCT in the last 72 hours. Urine analysis:    Component Value Date/Time   COLORURINE AMBER (A) 10/21/2019 1311   APPEARANCEUR HAZY (A) 10/21/2019 1311   APPEARANCEUR Clear 08/28/2018 1150   LABSPEC 1.017 10/21/2019 1311   PHURINE 5.0 10/21/2019 1311   GLUCOSEU NEGATIVE 10/21/2019 1311   HGBUR NEGATIVE 10/21/2019 1311   BILIRUBINUR NEGATIVE 10/21/2019 1311   BILIRUBINUR Negative 08/28/2018 [place] 10/21/2019 1311   PROTEINUR NEGATIVE 10/21/2019 1311   NITRITE NEGATIVE 10/21/2019 1311   LEUKOCYTESUR NEGATIVE 10/21/2019 1311    Radiological Exams on Admission: DG Chest Port 1 View  Result Date: 10/24/2019 CLINICAL DATA:  Chest pain and shortness of breath EXAM: PORTABLE CHEST 1 VIEW COMPARISON:  October 21, 2019 FINDINGS: Stable cardiomegaly. The hila and mediastinum are normal. No pneumothorax. No nodules or masses. No focal infiltrates. IMPRESSION: No active disease. Electronically Signed   By: Dorise Bullion III M.D   On: 10/24/2019 13:37    EKG: Independently reviewed. A-fib, RBB and LEFB chronic.  Assessment/Plan Active Problems:   Cellulitis  Cellulitis of the right leg, with thin and straw-colored discharge, suspect Strep, area involved more than 1/3 of right leg, continue Vanco and send ASO, probably can switch PO ABX tomorrow if conditions improving. Low suspicion for Gram negative organisms, discontinue Aztreonam.   Leokocytosis, from cellulitis, on IV ABX, recheck in AM.  Hx of Chronic diastolic CHF, restart diuresis.  HTN, continue home regimen, fairly controlled in ED.  A-fib, rate controlled, continue Eliquis.  Hx of OSA, CPAP Hs.     DVT prophylaxis: Eliquis Code Status: Full Family Communication: None at bedside Disposition Plan: Home  Consults called: None Admission status: Tele obs  Lequita Halt MD [facility] Pager [phone]  If 7PM-7AM, please contact night-coverage [email or web address] Password [email or web address]  10/24/2019, 2:21 PM

## 2019-10-24 NOTE — Progress Notes (Signed)
Notified bedside nurse of need to administer antibiotics.  One abx that was ordered was not given prior to leaving ED. Spoke with unit charge RN to ensure patient will get his ordered abx. She said they have already notified for the abx.

## 2019-10-24 NOTE — Progress Notes (Signed)
Pharmacy Antibiotic Note  Joel Dominguez is a 68 y.o. male admitted on 10/24/2019 with cellulitis.  Pharmacy has been consulted for vancomycin/aztreonam dosing. Patient reports a "rash" allergy to cephalexin in the past but does have outpatient Rx's documented in Epic for amoxicillin. Per discussion with Dr. Jeanell Sparrow, will stick with aztreonam for now. SCr 1.88 on admit.  Plan: Aztreonam 2g IV x 1; then 2g IV q8h Vancomycin 2500mg  IV x 1; then Vancomycin 1500 mg IV Q 24 hrs. Goal AUC 400-550. Expected AUC: 496 SCr used: 1.88 Monitor clinical progress, c/s, renal function F/u de-escalation plan/LOT, vancomycin levels as indicated   Height: 6' (182.9 cm) Weight: (!) 345 lb (156.5 kg) IBW/kg (Calculated) : 77.6  Temp (24hrs), Avg:99.5 F (37.5 C), Min:99.5 F (37.5 C), Max:99.5 F (37.5 C)  Recent Labs  Lab 10/21/19 1322  WBC 17.3*  CREATININE 2.04*    Estimated Creatinine Clearance: 53.5 mL/min (A) (by C-G formula based on SCr of 2.04 mg/dL (H)).    Allergies  Allergen Reactions  . Cephalexin Rash    Elicia Lamp, PharmD, BCPS Please check AMION for all [facility] contact numbers Clinical Pharmacist 10/24/2019 12:59 PM

## 2019-10-24 NOTE — ED Triage Notes (Signed)
Pt arrives via ems from home due to developing worsening swelling and cellulitis in his left leg x2 days. Patient arrives with softball sized blister to his right leg with redness. Patient also reports leg spasms as well.   Patient recently discharged Thursday for SOB.

## 2019-10-24 NOTE — Progress Notes (Signed)
Patient arrived on unit from ED.  Skin assessment done with Melva RN, in addition to right leg cellulitis, two sacral linear open wounds, noted, at crease between buttocks.  One measuring 4 cm and one measuring 2.5 cm.  A sacral protective foam was placed on area.  Weeping and open cellulitis wounds present with multiple open fluid filled blister, some of which have rupture.  Qshift wound care performed per order:: Xeroform, ABD pads, Kerlix, and 4 x 4 ACE from bottom of toes to back of knee.

## 2019-10-24 NOTE — ED Provider Notes (Signed)
[facility] EMERGENCY DEPARTMENT Provider Note   CSN: [id number] Arrival date & time: 10/24/19  1129     History Chief Complaint  Patient presents with  . Left Leg Cellulitis    Joel Dominguez is a 68 y.o. male.  HPI 68 year old male history of A. fib, aortic stenosis, atherosclerotic heart disease, CHF, chronic anticoagulation, morbid obesity presents today complaining of right leg pain with rash.  He noticed this rash on Friday.  It has been rapidly spreading.  He states that normally both of his legs are somewhat swollen due to his congestive heart failure and edema.  He states usually the right leg is somewhat more swollen than the left leg.  He noticed pain took off his compression socks yesterday.  At that time he noticed a red rash which has been spreading over the past 24 hours.  Is now almost up to his groin.  He has had some associated fever.  He denies any previous trauma to this leg and had not noted any injuries.  He denies headache, cough, nausea, vomiting, or diarrhea.  The pain in the leg is severe.    Past Medical History:  Diagnosis Date  . A-fib- couple yrs now. 10/08/2016  . Aortic stenosis 11/06/2017  . Atherosclerotic heart disease of native coronary artery with unspecified angina pectoris ([place]) 10/08/2016  . CHF (congestive heart failure) ([place])   . Chronic anticoagulation 10/08/2016  . Foot drop- R 10/08/2016   Patient known foot drop for many years due to lumbar spinal stenosis.-Over the past couple of months symptoms have gotten worse.  He feels he is tripping more and cannot lift his foot up as much. Toes are curling and calluses on end of toes.    - Did see spinal surgeon many yrs ago.  Declined sx at that time.     . Generalized OA 10/08/2016   Ortho doc-  Dr. [facility] in [place]- both knees replaced 08, 09.     . H/O noncompliance with medical treatment, presenting hazards to health 10/22/2017  . Heart disease   . Hypertension   . Obesity,  Class III, BMI 40-49.9 (morbid obesity) ([place]) 10/08/2016  . OSA (obstructive sleep apnea) 10/08/2016   Bi-PAP nitely- been 20+ yrs now  . Pulmonary hypertension ([place]) 10/08/2016  . Spinal stenosis, lumbar region, with neurogenic claudication 10/08/2016   Back specialist in [place]-    only surgeon he would use- pt declined referral here for back pain mgt    . Stroke [facility])     Patient Active Problem List   Diagnosis Date Noted  . Chronic foot ulcer ([place]) 08/08/2019  . Prediabetes 08/08/2019  . Left leg weakness 08/08/2019  . High risk medication use 08/08/2019  . Chronic ulcer of toe ([place]) 08/04/2019  . Pain due to onychomycosis of toenails of both feet 04/30/2019  . Coagulation disorder ([place]) 04/30/2019  . Chronic bilateral low back pain with right-sided sciatica 03/03/2018  . Lumbosacral radiculopathy 01/22/2018  . Osteoarthritis 11/19/2017  . Vitamin D deficiency 11/19/2017  . Positive for microalbuminuria 11/19/2017  . Hypertensive heart disease with heart failure ([place]) 11/10/2017  . 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 fascicular block) 11/07/2017  . Aortic stenosis 11/06/2017  . H/O noncompliance with medical treatment, presenting hazards to health 10/22/2017  . h/o Stroke 10/08/2016  . Atherosclerotic heart disease of native coronary artery with unspecified angina pectoris ([place]) 10/08/2016  . Sleep apnea with use of continuous positive airway pressure (CPAP) 10/08/2016  .  Chronic combined systolic and diastolic heart failure ([place]) 10/08/2016  . Generalized OA 10/08/2016  . Foot drop- R 10/08/2016  . Lumbar spinal stenosis 10/08/2016  . Pulmonary hypertension ([place]) 10/08/2016  . Chronic atrial fibrillation ([place]) 10/08/2016  . Chronic anticoagulation 10/08/2016  . Obesity, Class III, BMI 40-49.9 (morbid obesity) ([place]) 10/08/2016    Past Surgical History:  Procedure Laterality Date  . REPLACEMENT TOTAL KNEE Bilateral   . TIBIA FRACTURE SURGERY         Family  History  Problem Relation Age of Onset  . Congestive Heart Failure Mother   . Hypertension Mother   . Diabetes Mother   . Cancer Father        lung  . Congestive Heart Failure Sister   . Hypertension Sister   . Hypertension Brother   . Hypertension Sister   . Diabetes Brother     Social History   Tobacco Use  . Smoking status: Never Smoker  . Smokeless tobacco: Never Used  Substance Use Topics  . Alcohol use: Not Currently  . Drug use: No    Home Medications Prior to Admission medications   Medication Sig Start Date End Date Taking? Authorizing Provider  apixaban (ELIQUIS) 5 MG TABS tablet Take 1 tablet (5 mg total) by mouth 2 (two) times daily. 04/13/19   Richardo Priest, MD  bumetanide (BUMEX) 2 MG tablet TAKE 1 TABLET BY MOUTH 2 TIMES DAILY. 09/22/19   Richardo Priest, MD  carvedilol (COREG) 25 MG tablet TAKE 1 TABLET BY MOUTH 2 TIMES DAILY WITH A MEAL. 06/21/19   Richardo Priest, MD  HYDROcodone-acetaminophen (NORCO) 10-325 MG tablet Take 1 tablet by mouth as needed. 07/15/17   [provider]  Multiple Vitamin (MULTIVITAMIN) tablet Take 1 tablet by mouth daily.    [provider]  potassium chloride (K-DUR) 10 MEQ tablet TAKE 2 TABLETS BY MOUTH 2 TIMES DAILY. 06/28/19   Richardo Priest, MD  spironolactone (ALDACTONE) 25 MG tablet Take 1 tablet (25 mg total) by mouth daily. 08/05/19   Opalski, Deborah, DO  telmisartan (MICARDIS) 80 MG tablet TAKE 1 TABLET BY MOUTH DAILY. 08/17/19   Richardo Priest, MD  Vitamin D, Ergocalciferol, (DRISDOL) 1.25 MG (50000 UT) CAPS capsule TAKE 1 CAPSULE BY MOUTH EVERY 7 DAYS. 03/19/19   Mellody Dance, DO    Allergies    Cephalexin  Review of Systems   Review of Systems  All other systems reviewed and are negative.   Physical Exam Updated Vital Signs BP 114/60 (BP Location: Right Arm)   Pulse 89   Temp 99.5 F (37.5 C) (Oral)   Resp (!) 31   Ht 1.829 m (6')   Wt (!) 156.5 kg   SpO2 95%   BMI 46.79 kg/m    Physical Exam Vitals and nursing note reviewed.  Constitutional:      General: He is not in acute distress.    Appearance: Normal appearance. He is obese. He is ill-appearing.  HENT:     Head: Normocephalic.     Right Ear: External ear normal.     Left Ear: External ear normal.     Nose: Nose normal.     Mouth/Throat:     Mouth: Mucous membranes are moist.  Eyes:     Pupils: Pupils are equal, round, and reactive to light.  Cardiovascular:     Rate and Rhythm: Normal rate. Rhythm irregular.  Pulmonary:     Effort: Pulmonary effort is normal.  Breath sounds: Normal breath sounds.  Abdominal:     General: Bowel sounds are normal. There is no distension.     Palpations: Abdomen is soft.     Tenderness: There is no abdominal tenderness.  Musculoskeletal:     Cervical back: Normal range of motion.     Comments: Erythema with bullous lesion on the medial aspect of the leg that has ruptured Erythema spreading proximally into the groin The area is warm and extremely tender DP pulses are intact   Neurological:     Mental Status: He is alert.     ED Results / Procedures / Treatments   Labs (all labs ordered are listed, but only abnormal results are displayed) Labs Reviewed  CBC WITH DIFFERENTIAL/PLATELET - Abnormal; Notable for the following components:      Result Value   WBC 17.1 (*)    RBC 4.17 (*)    Hemoglobin 12.2 (*)    HCT 37.2 (*)    Neutro Abs 14.6 (*)    Monocytes Absolute 1.4 (*)    All other components within normal limits  APTT - Abnormal; Notable for the following components:   aPTT 53 (*)    All other components within normal limits  PROTIME-INR - Abnormal; Notable for the following components:   Prothrombin Time 22.8 (*)    INR 2.0 (*)    All other components within normal limits  CULTURE, BLOOD (ROUTINE X 2)  CULTURE, BLOOD (ROUTINE X 2)  URINE CULTURE  LACTIC ACID, PLASMA  LACTIC ACID, PLASMA  COMPREHENSIVE METABOLIC PANEL  URINALYSIS,  ROUTINE W REFLEX MICROSCOPIC  POC SARS CORONAVIRUS 2 AG -  ED    EKG None  Radiology DG Chest Port 1 View  Result Date: 10/24/2019 CLINICAL DATA:  Chest pain and shortness of breath EXAM: PORTABLE CHEST 1 VIEW COMPARISON:  October 21, 2019 FINDINGS: Stable cardiomegaly. The hila and mediastinum are normal. No pneumothorax. No nodules or masses. No focal infiltrates. IMPRESSION: No active disease. Electronically Signed   By: Dorise Bullion III M.D   On: 10/24/2019 13:37    Procedures .Critical Care Performed by: Pattricia Boss, MD Authorized by: Pattricia Boss, MD   Critical care provider statement:    Critical care time (minutes):  45   Critical care was necessary to treat or prevent imminent or life-threatening deterioration of the following conditions:  Sepsis   Critical care was time spent personally by me on the following activities:  Discussions with consultants, evaluation of patient's response to treatment, examination of patient, ordering and performing treatments and interventions, ordering and review of laboratory studies, ordering and review of radiographic studies, pulse oximetry, re-evaluation of patient's condition, obtaining history from patient or surrogate and review of old charts   (including critical care time)  Medications Ordered in ED Medications  aztreonam (AZACTAM) 2 g in sodium chloride 0.9 % 100 mL IVPB (has no administration in time range)  vancomycin (VANCOCIN) 2,500 mg in sodium chloride 0.9 % 500 mL IVPB (has no administration in time range)  morphine 2 MG/ML injection 2 mg (2 mg Intravenous Given 10/24/19 1303)    ED Course  I have reviewed the triage vital signs and the nursing notes.  Pertinent labs & imaging results that were available during my care of the patient were reviewed by me and considered in my medical decision making (see chart for details).    MDM Rules/Calculators/A&P  68 year old male presents today with  cellulitis of right lower extremity.  Patient treated here with aztreonam at that time and vancomycin.  Blood pressure and heart rate stable.  Initial lactic acid is normal.  Patient with known history of congestive heart failure.  Chest x-Devika Dragovich is clear.  Blood cultures are pending as is point-of-care Covid test. Discussed with Dr. Roosevelt Locks who will see for admission Patient appears hemodynamically stable here in ed with normal bp and hr, however, extensive cellulitis needs close monitoring for progression. Fluids will be run without bolus as patient taking po and due to cardiac issues and chf can easily become volume overloaded- currently no evidence of sepsis with normal vitals and normal lactic.  Joel Dominguez was evaluated in Emergency Department on 10/24/2019 for the symptoms described in the history of present illness. He was evaluated in the context of the global COVID-19 pandemic, which necessitated consideration that the patient might be at risk for infection with the SARS-CoV-2 virus that causes COVID-19. Institutional protocols and algorithms that pertain to the evaluation of patients at risk for COVID-19 are in a state of rapid change based on information released by regulatory bodies including the CDC and federal and state organizations. These policies and algorithms were followed during the patient's care in the ED.  Final Clinical Impression(s) / ED Diagnoses Final diagnoses:  Cellulitis of right lower extremity  Chronic congestive heart failure, unspecified heart failure type ([place])  Longstanding persistent atrial fibrillation ([facility])  Chronic anticoagulation    Rx / DC Orders ED Discharge Orders    None       Pattricia Boss, MD 10/24/19 1400

## 2019-10-24 NOTE — Progress Notes (Signed)
Pt states his wife will be bringing his home CPAP tomorrow.  Pt doesn't wish to wear a Hospital CPAP tonight.

## 2019-10-25 DIAGNOSIS — I96 Gangrene, not elsewhere classified: Secondary | ICD-10-CM | POA: Diagnosis present

## 2019-10-25 DIAGNOSIS — N179 Acute kidney failure, unspecified: Secondary | ICD-10-CM | POA: Diagnosis present

## 2019-10-25 DIAGNOSIS — N1831 Chronic kidney disease, stage 3a: Secondary | ICD-10-CM | POA: Diagnosis present

## 2019-10-25 DIAGNOSIS — I350 Nonrheumatic aortic (valve) stenosis: Secondary | ICD-10-CM | POA: Diagnosis present

## 2019-10-25 DIAGNOSIS — M159 Polyosteoarthritis, unspecified: Secondary | ICD-10-CM | POA: Diagnosis present

## 2019-10-25 DIAGNOSIS — I272 Pulmonary hypertension, unspecified: Secondary | ICD-10-CM | POA: Diagnosis present

## 2019-10-25 DIAGNOSIS — M21371 Foot drop, right foot: Secondary | ICD-10-CM | POA: Diagnosis present

## 2019-10-25 DIAGNOSIS — Z8673 Personal history of transient ischemic attack (TIA), and cerebral infarction without residual deficits: Secondary | ICD-10-CM | POA: Diagnosis not present

## 2019-10-25 DIAGNOSIS — L0390 Cellulitis, unspecified: Secondary | ICD-10-CM | POA: Diagnosis not present

## 2019-10-25 DIAGNOSIS — I509 Heart failure, unspecified: Secondary | ICD-10-CM | POA: Diagnosis not present

## 2019-10-25 DIAGNOSIS — L97519 Non-pressure chronic ulcer of other part of right foot with unspecified severity: Secondary | ICD-10-CM | POA: Diagnosis present

## 2019-10-25 DIAGNOSIS — I5032 Chronic diastolic (congestive) heart failure: Secondary | ICD-10-CM | POA: Diagnosis not present

## 2019-10-25 DIAGNOSIS — R609 Edema, unspecified: Secondary | ICD-10-CM | POA: Diagnosis not present

## 2019-10-25 DIAGNOSIS — Z6841 Body Mass Index (BMI) 40.0 and over, adult: Secondary | ICD-10-CM | POA: Diagnosis not present

## 2019-10-25 DIAGNOSIS — M62838 Other muscle spasm: Secondary | ICD-10-CM | POA: Diagnosis present

## 2019-10-25 DIAGNOSIS — L03115 Cellulitis of right lower limb: Secondary | ICD-10-CM | POA: Diagnosis present

## 2019-10-25 DIAGNOSIS — Z833 Family history of diabetes mellitus: Secondary | ICD-10-CM | POA: Diagnosis not present

## 2019-10-25 DIAGNOSIS — I4811 Longstanding persistent atrial fibrillation: Secondary | ICD-10-CM | POA: Diagnosis not present

## 2019-10-25 DIAGNOSIS — I5033 Acute on chronic diastolic (congestive) heart failure: Secondary | ICD-10-CM | POA: Diagnosis not present

## 2019-10-25 DIAGNOSIS — M48062 Spinal stenosis, lumbar region with neurogenic claudication: Secondary | ICD-10-CM | POA: Diagnosis present

## 2019-10-25 DIAGNOSIS — I130 Hypertensive heart and chronic kidney disease with heart failure and stage 1 through stage 4 chronic kidney disease, or unspecified chronic kidney disease: Secondary | ICD-10-CM | POA: Diagnosis present

## 2019-10-25 DIAGNOSIS — I4821 Permanent atrial fibrillation: Secondary | ICD-10-CM | POA: Diagnosis present

## 2019-10-25 DIAGNOSIS — Z8249 Family history of ischemic heart disease and other diseases of the circulatory system: Secondary | ICD-10-CM | POA: Diagnosis not present

## 2019-10-25 DIAGNOSIS — L89326 Pressure-induced deep tissue damage of left buttock: Secondary | ICD-10-CM | POA: Diagnosis not present

## 2019-10-25 DIAGNOSIS — I482 Chronic atrial fibrillation, unspecified: Secondary | ICD-10-CM | POA: Diagnosis not present

## 2019-10-25 DIAGNOSIS — Z7901 Long term (current) use of anticoagulants: Secondary | ICD-10-CM | POA: Diagnosis not present

## 2019-10-25 DIAGNOSIS — Z96653 Presence of artificial knee joint, bilateral: Secondary | ICD-10-CM | POA: Diagnosis present

## 2019-10-25 DIAGNOSIS — Z809 Family history of malignant neoplasm, unspecified: Secondary | ICD-10-CM | POA: Diagnosis not present

## 2019-10-25 DIAGNOSIS — I472 Ventricular tachycardia: Secondary | ICD-10-CM | POA: Diagnosis not present

## 2019-10-25 DIAGNOSIS — D631 Anemia in chronic kidney disease: Secondary | ICD-10-CM | POA: Diagnosis present

## 2019-10-25 DIAGNOSIS — G4733 Obstructive sleep apnea (adult) (pediatric): Secondary | ICD-10-CM | POA: Diagnosis present

## 2019-10-25 DIAGNOSIS — R5381 Other malaise: Secondary | ICD-10-CM | POA: Diagnosis not present

## 2019-10-25 DIAGNOSIS — I5042 Chronic combined systolic (congestive) and diastolic (congestive) heart failure: Secondary | ICD-10-CM | POA: Diagnosis present

## 2019-10-25 DIAGNOSIS — M726 Necrotizing fasciitis: Secondary | ICD-10-CM | POA: Diagnosis not present

## 2019-10-25 DIAGNOSIS — I951 Orthostatic hypotension: Secondary | ICD-10-CM | POA: Diagnosis not present

## 2019-10-25 DIAGNOSIS — D62 Acute posthemorrhagic anemia: Secondary | ICD-10-CM | POA: Diagnosis not present

## 2019-10-25 DIAGNOSIS — I25119 Atherosclerotic heart disease of native coronary artery with unspecified angina pectoris: Secondary | ICD-10-CM | POA: Diagnosis present

## 2019-10-25 DIAGNOSIS — L89316 Pressure-induced deep tissue damage of right buttock: Secondary | ICD-10-CM | POA: Diagnosis not present

## 2019-10-25 DIAGNOSIS — I2510 Atherosclerotic heart disease of native coronary artery without angina pectoris: Secondary | ICD-10-CM | POA: Diagnosis present

## 2019-10-25 DIAGNOSIS — I080 Rheumatic disorders of both mitral and aortic valves: Secondary | ICD-10-CM | POA: Diagnosis present

## 2019-10-25 DIAGNOSIS — Z20822 Contact with and (suspected) exposure to covid-19: Secondary | ICD-10-CM | POA: Diagnosis present

## 2019-10-25 DIAGNOSIS — I110 Hypertensive heart disease with heart failure: Secondary | ICD-10-CM | POA: Diagnosis present

## 2019-10-25 LAB — BASIC METABOLIC PANEL
Anion gap: 12 (ref 5–15)
BUN: 45 mg/dL — ABNORMAL HIGH (ref 8–23)
CO2: 25 mmol/L (ref 22–32)
Calcium: 8.3 mg/dL — ABNORMAL LOW (ref 8.9–10.3)
Chloride: 100 mmol/L (ref 98–111)
Creatinine, Ser: 1.75 mg/dL — ABNORMAL HIGH (ref 0.61–1.24)
GFR calc Af Amer: 45 mL/min — ABNORMAL LOW (ref >60)
GFR calc non Af Amer: 39 mL/min — ABNORMAL LOW (ref >60)
Glucose, Bld: 121 mg/dL — ABNORMAL HIGH (ref 70–99)
Potassium: 4 mmol/L (ref 3.5–5.1)
Sodium: 137 mmol/L (ref 135–145)

## 2019-10-25 LAB — CBC WITH DIFFERENTIAL/PLATELET
Abs Immature Granulocytes: 0.28 10*3/uL — ABNORMAL HIGH (ref 0.00–0.07)
Basophils Absolute: 0.1 10*3/uL (ref 0.0–0.1)
Basophils Relative: 0 %
Eosinophils Absolute: 0.2 10*3/uL (ref 0.0–0.5)
Eosinophils Relative: 1 %
HCT: 34.2 % — ABNORMAL LOW (ref 39.0–52.0)
Hemoglobin: 11.4 g/dL — ABNORMAL LOW (ref 13.0–17.0)
Immature Granulocytes: 1 %
Lymphocytes Relative: 5 %
Lymphs Abs: 1.2 10*3/uL (ref 0.7–4.0)
MCH: 29.7 pg (ref 26.0–34.0)
MCHC: 33.3 g/dL (ref 30.0–36.0)
MCV: 89.1 fL (ref 80.0–100.0)
Monocytes Absolute: 1.9 10*3/uL — ABNORMAL HIGH (ref 0.1–1.0)
Monocytes Relative: 9 %
Neutro Abs: 18.5 10*3/uL — ABNORMAL HIGH (ref 1.7–7.7)
Neutrophils Relative %: 84 %
Platelets: 240 10*3/uL (ref 150–400)
RBC: 3.84 MIL/uL — ABNORMAL LOW (ref 4.22–5.81)
RDW: 14.2 % (ref 11.5–15.5)
WBC: 22.1 10*3/uL — ABNORMAL HIGH (ref 4.0–10.5)
nRBC: 0 % (ref 0.0–0.2)

## 2019-10-25 LAB — ANTISTREPTOLYSIN O TITER: ASO: 217 IU/mL — ABNORMAL HIGH (ref 0.0–200.0)

## 2019-10-25 MED ORDER — HYDROMORPHONE HCL 1 MG/ML IJ SOLN
1.0000 mg | INTRAMUSCULAR | Status: DC | PRN
  Administered 2019-10-25 – 2019-10-27 (×9): 1 mg via INTRAVENOUS
  Filled 2019-10-25 (×9): qty 1

## 2019-10-25 MED ORDER — BUMETANIDE 2 MG PO TABS
2.0000 mg | ORAL_TABLET | Freq: Every day | ORAL | Status: DC
  Administered 2019-10-25 – 2019-10-28 (×4): 2 mg via ORAL
  Filled 2019-10-25 (×4): qty 1

## 2019-10-25 MED ORDER — MORPHINE SULFATE (PF) 4 MG/ML IV SOLN
4.0000 mg | Freq: Once | INTRAVENOUS | Status: AC
  Administered 2019-10-27: 04:00:00 4 mg via INTRAVENOUS
  Filled 2019-10-25: qty 1

## 2019-10-25 NOTE — Progress Notes (Signed)
Patient has home machine and places himself on and off. Will call if any assistance needed.

## 2019-10-25 NOTE — Progress Notes (Signed)
Pt oral temp read 100.8 pt also had increasing leg pain  MD notified  Will continue to monitor

## 2019-10-25 NOTE — Progress Notes (Addendum)
PROGRESS NOTE    Joel Dominguez  [id number] DOB: July 10, 1951 DOA: 10/24/2019 PCP: Mellody Dance, DO  Brief Narrative: Joel Dominguez is a 68 y.o. male with medical history significant of history of A. fib, aortic stenosis, atherosclerotic heart disease, diastolic CHF, moderate mitral regurgitation, chronic anticoagulation, morbid obesity presented to the ED yesterday with right leg pain and swelling with blisters -He was noted to be febrile to 100.8 with a white count of 17,000  Assessment & Plan:   Severe right leg cellulitis -With blisters, possibly strep -White count worsening to 22,000 today, patient has significant distress as well -Continue IV vancomycin (patient is allergic to cephalosporins) -Resume Bumex at low-dose, elevate right leg -Follow-up blood cultures -Check hemoglobin A1c  Worsening leukocytosis -As above  Chronic diastolic CHF -Resume Bumex at a lower dose of 2 mg daily, Aldactone on hold  Paroxysmal atrial fibrillation -Continue beta-blocker and Eliquis  History of OSA -Wife to bring CPAP machine tonight  Morbid obesity -BMI is 48, lifestyle modification and diet recommended   DVT prophylaxis: Eliquis Code Status: Full code Family Communication: No family at bedside discussed the patient Disposition Plan: Home pending clinical improvement likely 2 to 3 days  Consultants:      Procedures:   Antimicrobials:    Subjective: -Has severe pain in his right leg, denies any dyspnea today  Objective: Vitals:   10/25/19 0119 10/25/19 0300 10/25/19 0626 10/25/19 0737  BP: 107/63  126/73 (!) 158/80  Pulse: 96  99 93  Resp: 20  20 18   Temp: (!) 100.8 F (38.2 C)  98.5 F (36.9 C) 97.7 F (36.5 C)  TempSrc: Oral     SpO2: 96%  93% 91%  Weight:  (!) 160.6 kg    Height:        Intake/Output Summary (Last 24 hours) at 10/25/2019 [id number] Last data filed at 10/25/2019 0900 Gross per 24 hour  Intake 960 ml  Output 1550 ml  Net -590 ml    Filed Weights   10/24/19 1146 10/25/19 0300  Weight: (!) 156.5 kg (!) 160.6 kg    Examination:  General exam: Morbidly obese male sitting up in bed, uncomfortable appearing, AAO x3 Respiratory system: Few basilar rales Cardiovascular system: S1 & S2 heard, RRR.  Gastrointestinal system: Abdomen is nondistended, soft and nontender.Normal bowel sounds heard. Central nervous system: Alert and oriented. No focal neurological deficits. Extremities: Right leg with erythema warmth and tenderness and blisters, large blister near the ankle Skin: As above Psychiatry:  Mood & affect appropriate.     Data Reviewed:   CBC: Recent Labs  Lab 10/21/19 1322 10/24/19 1300 10/25/19 0543  WBC 17.3* 17.1* 22.1*  NEUTROABS 16.0* 14.6* 18.5*  HGB 13.8 12.2* 11.4*  HCT 42.3 37.2* 34.2*  MCV 90.0 89.2 89.1  PLT 252 232 [id number]   Basic Metabolic Panel: Recent Labs  Lab 10/21/19 1322 10/24/19 1300 10/25/19 0718  NA 135 134* 137  K 4.3 4.0 4.0  CL 98 97* 100  CO2 24 24 25   GLUCOSE 139* 135* 121*  BUN 30* 49* 45*  CREATININE 2.04* 1.88* 1.75*  CALCIUM 8.7* 8.5* 8.3*   GFR: Estimated Creatinine Clearance: 63.3 mL/min (A) (by C-G formula based on SCr of 1.75 mg/dL (H)). Liver Function Tests: Recent Labs  Lab 10/21/19 1322 10/24/19 1300  AST 28 34  ALT 21 26  ALKPHOS 78 70  BILITOT 1.2 1.2  PROT 7.8 7.6  ALBUMIN 3.8 3.0*   No results for input(s): LIPASE, AMYLASE in the  last 168 hours. No results for input(s): AMMONIA in the last 168 hours. Coagulation Profile: Recent Labs  Lab 10/24/19 1300  INR 2.0*   Cardiac Enzymes: No results for input(s): CKTOTAL, CKMB, CKMBINDEX, TROPONINI in the last 168 hours. BNP (last 3 results) No results for input(s): PROBNP in the last 8760 hours. HbA1C: No results for input(s): HGBA1C in the last 72 hours. CBG: No results for input(s): GLUCAP in the last 168 hours. Lipid Profile: No results for input(s): CHOL, HDL, LDLCALC, TRIG, CHOLHDL,  LDLDIRECT in the last 72 hours. Thyroid Function Tests: No results for input(s): TSH, T4TOTAL, FREET4, T3FREE, THYROIDAB in the last 72 hours. Anemia Panel: No results for input(s): VITAMINB12, FOLATE, FERRITIN, TIBC, IRON, RETICCTPCT in the last 72 hours. Urine analysis:    Component Value Date/Time   COLORURINE AMBER (A) 10/21/2019 1311   APPEARANCEUR HAZY (A) 10/21/2019 1311   APPEARANCEUR Clear 08/28/2018 1150   LABSPEC 1.017 10/21/2019 1311   PHURINE 5.0 10/21/2019 1311   GLUCOSEU NEGATIVE 10/21/2019 1311   HGBUR NEGATIVE 10/21/2019 1311   BILIRUBINUR NEGATIVE 10/21/2019 1311   BILIRUBINUR Negative 08/28/2018 1150   KETONESUR NEGATIVE 10/21/2019 1311   PROTEINUR NEGATIVE 10/21/2019 1311   NITRITE NEGATIVE 10/21/2019 1311   LEUKOCYTESUR NEGATIVE 10/21/2019 1311   Sepsis Labs: @LABRCNTIP (procalcitonin:4,lacticidven:4)  ) Recent Results (from the past 240 hour(s))  SARS CORONAVIRUS 2 (TAT 6-24 HRS) Nasopharyngeal Nasopharyngeal Swab     Status: None   Collection Time: 10/21/19  1:51 PM   Specimen: Nasopharyngeal Swab  Result Value Ref Range Status   SARS Coronavirus 2 NEGATIVE NEGATIVE Final    Comment: (NOTE) SARS-CoV-2 target nucleic acids are NOT DETECTED. The SARS-CoV-2 RNA is generally detectable in upper and lower respiratory specimens during the acute phase of infection. Negative results do not preclude SARS-CoV-2 infection, do not rule out co-infections with other pathogens, and should not be used as the sole basis for treatment or other patient management decisions. Negative results must be combined with clinical observations, patient history, and epidemiological information. The expected result is Negative. Fact Sheet for Patients: SugarRoll.be Fact Sheet for Healthcare Providers: https://www.woods-mathews.com/ This test is not yet approved or cleared by the [place] FDA and  has been authorized for detection  and/or diagnosis of SARS-CoV-2 by FDA under an Emergency Use Authorization (EUA). This EUA will remain  in effect (meaning this test can be used) for the duration of the COVID-19 declaration under Section 56 4(b)(1) of the Act, 21 U.S.C. section 360bbb-3(b)(1), unless the authorization is terminated or revoked sooner. Performed at [facility], [place] [address] [place], [place] [address]   Blood Culture (routine x 2)     Status: None (Preliminary result)   Collection Time: 10/24/19  1:00 PM   Specimen: BLOOD LEFT HAND  Result Value Ref Range Status   Specimen Description BLOOD LEFT HAND  Final   Special Requests   Final    BOTTLES DRAWN AEROBIC AND ANAEROBIC Blood Culture adequate volume   Culture   Final    NO GROWTH < 24 HOURS Performed at [facility], [place] [address] [place], [place] [address]    Report Status PENDING  Incomplete  SARS CORONAVIRUS 2 (TAT 6-24 HRS) Nasopharyngeal Nasopharyngeal Swab     Status: None   Collection Time: 10/24/19  1:05 PM   Specimen: Nasopharyngeal Swab  Result Value Ref Range Status   SARS Coronavirus 2 NEGATIVE NEGATIVE Final    Comment: (NOTE) SARS-CoV-2 target nucleic acids are  NOT DETECTED. The SARS-CoV-2 RNA is generally detectable in upper and lower respiratory specimens during the acute phase of infection. Negative results do not preclude SARS-CoV-2 infection, do not rule out co-infections with other pathogens, and should not be used as the sole basis for treatment or other patient management decisions. Negative results must be combined with clinical observations, patient history, and epidemiological information. The expected result is Negative. Fact Sheet for Patients: SugarRoll.be Fact Sheet for Healthcare Providers: https://www.woods-mathews.com/ This test is not yet approved or cleared by the [place] FDA and  has been authorized for detection and/or diagnosis of SARS-CoV-2  by FDA under an Emergency Use Authorization (EUA). This EUA will remain  in effect (meaning this test can be used) for the duration of the COVID-19 declaration under Section 56 4(b)(1) of the Act, 21 U.S.C. section 360bbb-3(b)(1), unless the authorization is terminated or revoked sooner. Performed at [facility], [place] [address] [place], [place] [address]   Blood Culture (routine x 2)     Status: None (Preliminary result)   Collection Time: 10/24/19  2:45 PM   Specimen: BLOOD RIGHT HAND  Result Value Ref Range Status   Specimen Description BLOOD RIGHT HAND  Final   Special Requests   Final    BOTTLES DRAWN AEROBIC AND ANAEROBIC Blood Culture adequate volume   Culture   Final    NO GROWTH < 24 HOURS Performed at [facility], [place] [address] [place], [place] [address]    Report Status PENDING  Incomplete         Radiology Studies: DG Chest Port 1 View  Result Date: 10/24/2019 CLINICAL DATA:  Chest pain and shortness of breath EXAM: PORTABLE CHEST 1 VIEW COMPARISON:  October 21, 2019 FINDINGS: Stable cardiomegaly. The hila and mediastinum are normal. No pneumothorax. No nodules or masses. No focal infiltrates. IMPRESSION: No active disease. Electronically Signed   By: Dorise Bullion III M.D   On: 10/24/2019 13:37        Scheduled Meds: . apixaban  5 mg Oral BID  . bumetanide  2 mg Oral Daily  . carvedilol  25 mg Oral BID WC  .  morphine injection  4 mg Intravenous Once   Continuous Infusions: . aztreonam 2 g (10/25/19 0508)  . vancomycin       LOS: 0 days    Time spent: 39min    Domenic Polite, MD [facility]  10/25/2019, 9:39 AM

## 2019-10-26 ENCOUNTER — Telehealth: Payer: Self-pay

## 2019-10-26 DIAGNOSIS — Z9989 Dependence on other enabling machines and devices: Secondary | ICD-10-CM

## 2019-10-26 DIAGNOSIS — N1831 Chronic kidney disease, stage 3a: Secondary | ICD-10-CM

## 2019-10-26 DIAGNOSIS — G4733 Obstructive sleep apnea (adult) (pediatric): Secondary | ICD-10-CM

## 2019-10-26 DIAGNOSIS — N179 Acute kidney failure, unspecified: Secondary | ICD-10-CM

## 2019-10-26 DIAGNOSIS — D72824 Basophilia: Secondary | ICD-10-CM

## 2019-10-26 DIAGNOSIS — I10 Essential (primary) hypertension: Secondary | ICD-10-CM

## 2019-10-26 LAB — CBC
HCT: 32.8 % — ABNORMAL LOW (ref 39.0–52.0)
Hemoglobin: 10.6 g/dL — ABNORMAL LOW (ref 13.0–17.0)
MCH: 29.1 pg (ref 26.0–34.0)
MCHC: 32.3 g/dL (ref 30.0–36.0)
MCV: 90.1 fL (ref 80.0–100.0)
Platelets: 266 10*3/uL (ref 150–400)
RBC: 3.64 MIL/uL — ABNORMAL LOW (ref 4.22–5.81)
RDW: 14.1 % (ref 11.5–15.5)
WBC: 19 10*3/uL — ABNORMAL HIGH (ref 4.0–10.5)
nRBC: 0 % (ref 0.0–0.2)

## 2019-10-26 LAB — BASIC METABOLIC PANEL
Anion gap: 13 (ref 5–15)
BUN: 42 mg/dL — ABNORMAL HIGH (ref 8–23)
CO2: 24 mmol/L (ref 22–32)
Calcium: 8.4 mg/dL — ABNORMAL LOW (ref 8.9–10.3)
Chloride: 98 mmol/L (ref 98–111)
Creatinine, Ser: 1.5 mg/dL — ABNORMAL HIGH (ref 0.61–1.24)
GFR calc Af Amer: 55 mL/min — ABNORMAL LOW (ref >60)
GFR calc non Af Amer: 47 mL/min — ABNORMAL LOW (ref >60)
Glucose, Bld: 123 mg/dL — ABNORMAL HIGH (ref 70–99)
Potassium: 3.7 mmol/L (ref 3.5–5.1)
Sodium: 135 mmol/L (ref 135–145)

## 2019-10-26 LAB — URINE CULTURE: Culture: NO GROWTH

## 2019-10-26 MED ORDER — SODIUM CHLORIDE 0.9 % IV SOLN
2.0000 g | INTRAVENOUS | Status: DC
  Administered 2019-10-26 – 2019-10-28 (×13): 2 g via INTRAVENOUS
  Filled 2019-10-26 (×4): qty 2000
  Filled 2019-10-26: qty 2
  Filled 2019-10-26: qty 2000
  Filled 2019-10-26: qty 2
  Filled 2019-10-26 (×4): qty 2000
  Filled 2019-10-26: qty 2
  Filled 2019-10-26: qty 2000
  Filled 2019-10-26: qty 2
  Filled 2019-10-26 (×3): qty 2000
  Filled 2019-10-26: qty 2
  Filled 2019-10-26: qty 2000

## 2019-10-26 MED ORDER — SODIUM CHLORIDE 0.9 % IV SOLN
INTRAVENOUS | Status: DC | PRN
  Administered 2019-10-26: 250 mL via INTRAVENOUS

## 2019-10-26 NOTE — Telephone Encounter (Signed)
Pt is currently inpatient for cellulitis.  I was unable to speak with the patient.  Charyl Bigger, CMA

## 2019-10-26 NOTE — Progress Notes (Signed)
PROGRESS NOTE  Joel Dominguez [id number] DOB: 08-30-1951   PCP: Mellody Dance, DO  Patient is from: Home.  DOA: 10/24/2019 LOS: 1  Brief Narrative / Interim history: 68 year old male with history of A. fib on Eliquis, AAS, diastolic CHF, moderate MR, morbid obesity and OSA presented with right leg pain and swelling with blisters.  Noted to be febrile to 100.8 with leukocytosis to 17,000.  Started on broad-spectrum antibiotics for severe right leg cellulitis.  Never had similar issues before.  Denies recent medication changes or new medication.  Subjective: No major events overnight of this morning.  Reports pain in his right leg.  Denies itching.  Denies chest pain, dyspnea, GI or UTI symptoms.  Objective: Vitals:   10/26/19 0433 10/26/19 0500 10/26/19 0616 10/26/19 1217  BP: 133/64   115/67  Pulse: 90   (!) 101  Resp: 20 14 20 18   Temp: 99.6 F (37.6 C)   98 F (36.7 C)  TempSrc: Oral     SpO2: 93%   90%  Weight:   (!) 160.1 kg   Height:        Intake/Output Summary (Last 24 hours) at 10/26/2019 1427 Last data filed at 10/26/2019 1300 Gross per 24 hour  Intake 1280 ml  Output 900 ml  Net 380 ml   Filed Weights   10/24/19 1146 10/25/19 0300 10/26/19 0616  Weight: (!) 156.5 kg (!) 160.6 kg (!) 160.1 kg    Examination:  GENERAL: No acute distress.  Appears well.  HEENT: MMM.  Vision and hearing grossly intact.  NECK: Supple.  No apparent JVD.  RESP:  No IWOB. Good air movement bilaterally. CVS:  RRR. Heart sounds normal.  ABD/GI/GU: Bowel sounds present. Soft. Non tender.  MSK/EXT:  No apparent deformity or edema. Moves extremities. SKIN: Erythematous skin lesion with vitiligo over right lower extremity to his thigh.  See picture below for more. NEURO: Awake, alert and oriented appropriately.  No gross deficit.  PSYCH: Calm. Normal affect.     Procedures:  None.  Assessment & Plan: Severe right leg cellulitis: seems to be progressing compared to the  image taken on 12/20.  However, leukocytosis and fever curve downtrending.  Lactic acid, blood and urine cultures negative so far.  Hemodynamically stable. -IV vancomycin 12/20-12/22.  Aztreonam 12/20.   -IV ampicillin 12/22>> has taken amoxicillin without problem before. -Continue wound care and pain control. -Continue home diuretics  AKI on CKD-3a/azotemia: Improving. Could be combination of prerenal and ATN. Cr 1.2-1.4 (baseline)> 2.04 (10/21/2019)> 1.88 (admit)>> 1.55 -Continue monitoring  Chronic diastolic CHF/pulmonary HTN: appears euvolemic except for significant edema in RLE from cellulitis. -Continue Bumex at 2 mg daily- twice daily at home. -Monitor fluid status, renal function and electrolytes -Sodium and fluid restriction.  Paroxysmal A. Fib: Rate controlled on Coreg.  On Eliquis for anticoagulation. -Continue home medications  History of CVA without residual deficit: Stable -Continue home medications.  Essential hypertension: Normotensive. -Continue Coreg and Bumex. -Continue holding my Cardizem and Aldactone.  Anemia of chronic disease: H&H relatively stable. -Continue monitoring  History of OSA -Nightly CPAP  Morbid obesity: BMI 48. -Encourage lifestyle change to lose weight.  .                DVT prophylaxis: On Eliquis for A. fib. Code Status: Full code Family Communication: Updated patient's over the phone from bedside. Disposition Plan: Remains inpatient Consultants: None   Microbiology summarized: [id number] negative. Blood cultures negative. Urine cultures negative.  Sch Meds:  Scheduled  Meds: . apixaban  5 mg Oral BID  . bumetanide  2 mg Oral Daily  . carvedilol  25 mg Oral BID WC  .  morphine injection  4 mg Intravenous Once   Continuous Infusions: . sodium chloride 250 mL (10/26/19 1204)  . ampicillin (OMNIPEN) IV 2 g (10/26/19 1205)   PRN Meds:.sodium chloride, acetaminophen, cyclobenzaprine, HYDROmorphone (DILAUDID)  injection  Antimicrobials: Anti-infectives (From admission, onward)   Start     Dose/Rate Route Frequency Ordered Stop   10/26/19 1200  ampicillin (OMNIPEN) 2 g in sodium chloride 0.9 % 100 mL IVPB     2 g 300 mL/hr over 20 Minutes Intravenous Every 4 hours 10/26/19 1009     10/25/19 1500  vancomycin (VANCOREADY) IVPB 1500 mg/300 mL  Status:  Discontinued     1,500 mg 150 mL/hr over 120 Minutes Intravenous Every 24 hours 10/24/19 1354 10/26/19 1009   10/24/19 2200  aztreonam (AZACTAM) 2 g in sodium chloride 0.9 % 100 mL IVPB  Status:  Discontinued     2 g 200 mL/hr over 30 Minutes Intravenous Every 8 hours 10/24/19 1354 10/25/19 0956   10/24/19 1300  vancomycin (VANCOCIN) 2,500 mg in sodium chloride 0.9 % 500 mL IVPB     2,500 mg 250 mL/hr over 120 Minutes Intravenous  Once 10/24/19 1259 10/24/19 1642   10/24/19 1245  vancomycin (VANCOCIN) IVPB 1000 mg/200 mL premix  Status:  Discontinued     1,000 mg 200 mL/hr over 60 Minutes Intravenous  Once 10/24/19 1240 10/24/19 1259   10/24/19 1245  aztreonam (AZACTAM) 2 g in sodium chloride 0.9 % 100 mL IVPB     2 g 200 mL/hr over 30 Minutes Intravenous  Once 10/24/19 1240 10/24/19 1819       I have personally reviewed the following labs and images: CBC: Recent Labs  Lab 10/21/19 1322 10/24/19 1300 10/25/19 0543 10/26/19 0449  WBC 17.3* 17.1* 22.1* 19.0*  NEUTROABS 16.0* 14.6* 18.5*  --   HGB 13.8 12.2* 11.4* 10.6*  HCT 42.3 37.2* 34.2* 32.8*  MCV 90.0 89.2 89.1 90.1  PLT 252 232 240 266   BMP &GFR Recent Labs  Lab 10/21/19 1322 10/24/19 1300 10/25/19 0718 10/26/19 0449  NA 135 134* 137 135  K 4.3 4.0 4.0 3.7  CL 98 97* 100 98  CO2 24 24 25 24   GLUCOSE 139* 135* 121* 123*  BUN 30* 49* 45* 42*  CREATININE 2.04* 1.88* 1.75* 1.50*  CALCIUM 8.7* 8.5* 8.3* 8.4*   Estimated Creatinine Clearance: 73.7 mL/min (A) (by C-G formula based on SCr of 1.5 mg/dL (H)). Liver & Pancreas: Recent Labs  Lab 10/21/19 1322 10/24/19 1300   AST 28 34  ALT 21 26  ALKPHOS 78 70  BILITOT 1.2 1.2  PROT 7.8 7.6  ALBUMIN 3.8 3.0*   No results for input(s): LIPASE, AMYLASE in the last 168 hours. No results for input(s): AMMONIA in the last 168 hours. Diabetic: No results for input(s): HGBA1C in the last 72 hours. No results for input(s): GLUCAP in the last 168 hours. Cardiac Enzymes: No results for input(s): CKTOTAL, CKMB, CKMBINDEX, TROPONINI in the last 168 hours. No results for input(s): PROBNP in the last 8760 hours. Coagulation Profile: Recent Labs  Lab 10/24/19 1300  INR 2.0*   Thyroid Function Tests: No results for input(s): TSH, T4TOTAL, FREET4, T3FREE, THYROIDAB in the last 72 hours. Lipid Profile: No results for input(s): CHOL, HDL, LDLCALC, TRIG, CHOLHDL, LDLDIRECT in the last 72 hours. Anemia Panel:  No results for input(s): VITAMINB12, FOLATE, FERRITIN, TIBC, IRON, RETICCTPCT in the last 72 hours. Urine analysis:    Component Value Date/Time   COLORURINE AMBER (A) 10/21/2019 1311   APPEARANCEUR HAZY (A) 10/21/2019 1311   APPEARANCEUR Clear 08/28/2018 1150   LABSPEC 1.017 10/21/2019 1311   PHURINE 5.0 10/21/2019 1311   GLUCOSEU NEGATIVE 10/21/2019 1311   HGBUR NEGATIVE 10/21/2019 1311   BILIRUBINUR NEGATIVE 10/21/2019 1311   BILIRUBINUR Negative 08/28/2018 1150   KETONESUR NEGATIVE 10/21/2019 1311   PROTEINUR NEGATIVE 10/21/2019 1311   NITRITE NEGATIVE 10/21/2019 1311   LEUKOCYTESUR NEGATIVE 10/21/2019 1311   Sepsis Labs: Invalid input(s): PROCALCITONIN, [place]  Microbiology: Recent Results (from the past 240 hour(s))  SARS CORONAVIRUS 2 (TAT 6-24 HRS) Nasopharyngeal Nasopharyngeal Swab     Status: None   Collection Time: 10/21/19  1:51 PM   Specimen: Nasopharyngeal Swab  Result Value Ref Range Status   SARS Coronavirus 2 NEGATIVE NEGATIVE Final    Comment: (NOTE) SARS-CoV-2 target nucleic acids are NOT DETECTED. The SARS-CoV-2 RNA is generally detectable in upper and lower respiratory  specimens during the acute phase of infection. Negative results do not preclude SARS-CoV-2 infection, do not rule out co-infections with other pathogens, and should not be used as the sole basis for treatment or other patient management decisions. Negative results must be combined with clinical observations, patient history, and epidemiological information. The expected result is Negative. Fact Sheet for Patients: SugarRoll.be Fact Sheet for Healthcare Providers: https://www.woods-mathews.com/ This test is not yet approved or cleared by the [place] FDA and  has been authorized for detection and/or diagnosis of SARS-CoV-2 by FDA under an Emergency Use Authorization (EUA). This EUA will remain  in effect (meaning this test can be used) for the duration of the COVID-19 declaration under Section 56 4(b)(1) of the Act, 21 U.S.C. section 360bbb-3(b)(1), unless the authorization is terminated or revoked sooner. Performed at [facility], [place] [address], [place], [place] [address]   Blood Culture (routine x 2)     Status: None (Preliminary result)   Collection Time: 10/24/19  1:00 PM   Specimen: BLOOD LEFT HAND  Result Value Ref Range Status   Specimen Description BLOOD LEFT HAND  Final   Special Requests   Final    BOTTLES DRAWN AEROBIC AND ANAEROBIC Blood Culture adequate volume   Culture   Final    NO GROWTH 2 DAYS Performed at [facility], [place] [address], [place], [place] [address]    Report Status PENDING  Incomplete  SARS CORONAVIRUS 2 (TAT 6-24 HRS) Nasopharyngeal Nasopharyngeal Swab     Status: None   Collection Time: 10/24/19  1:05 PM   Specimen: Nasopharyngeal Swab  Result Value Ref Range Status   SARS Coronavirus 2 NEGATIVE NEGATIVE Final    Comment: (NOTE) SARS-CoV-2 target nucleic acids are NOT DETECTED. The SARS-CoV-2 RNA is generally detectable in upper and lower respiratory specimens during the acute phase of  infection. Negative results do not preclude SARS-CoV-2 infection, do not rule out co-infections with other pathogens, and should not be used as the sole basis for treatment or other patient management decisions. Negative results must be combined with clinical observations, patient history, and epidemiological information. The expected result is Negative. Fact Sheet for Patients: SugarRoll.be Fact Sheet for Healthcare Providers: https://www.woods-mathews.com/ This test is not yet approved or cleared by the [place] FDA and  has been authorized for detection and/or diagnosis of SARS-CoV-2 by FDA under an Emergency Use Authorization (  EUA). This EUA will remain  in effect (meaning this test can be used) for the duration of the COVID-19 declaration under Section 56 4(b)(1) of the Act, 21 U.S.C. section 360bbb-3(b)(1), unless the authorization is terminated or revoked sooner. Performed at [facility], [place] [address] [place], [place] [address]   Blood Culture (routine x 2)     Status: None (Preliminary result)   Collection Time: 10/24/19  2:45 PM   Specimen: BLOOD RIGHT HAND  Result Value Ref Range Status   Specimen Description BLOOD RIGHT HAND  Final   Special Requests   Final    BOTTLES DRAWN AEROBIC AND ANAEROBIC Blood Culture adequate volume   Culture   Final    NO GROWTH 2 DAYS Performed at [facility], [place] [address], [place], [place] [address]    Report Status PENDING  Incomplete  Urine culture     Status: None   Collection Time: 10/24/19  8:20 PM   Specimen: In/Out Cath Urine  Result Value Ref Range Status   Specimen Description IN/OUT CATH URINE  Final   Special Requests NONE  Final   Culture   Final    NO GROWTH Performed at [facility], [place] [address] [place], [place] [address]    Report Status 10/26/2019 FINAL  Final    Radiology Studies: No results found.    Lorrin Bodner T. [place]  If 7PM-7AM, please contact night-coverage [email or web address] Password [facility] 10/26/2019, 2:27 PM

## 2019-10-26 NOTE — Plan of Care (Signed)
?  Problem: Activity: ?Goal: Capacity to carry out activities will improve ?Outcome: Progressing ?  ?

## 2019-10-26 NOTE — Telephone Encounter (Signed)
I wonder where the prior conversation about this went.  It is not attached to your note here and I am uncertain as to what exactly I said.    If you need additional input /advice about the patient from me, please let me know. thnx

## 2019-10-27 ENCOUNTER — Inpatient Hospital Stay (HOSPITAL_COMMUNITY): Payer: Medicare Other

## 2019-10-27 DIAGNOSIS — R609 Edema, unspecified: Secondary | ICD-10-CM

## 2019-10-27 LAB — CBC WITH DIFFERENTIAL/PLATELET
Abs Immature Granulocytes: 0.34 10*3/uL — ABNORMAL HIGH (ref 0.00–0.07)
Basophils Absolute: 0.1 10*3/uL (ref 0.0–0.1)
Basophils Relative: 1 %
Eosinophils Absolute: 0.2 10*3/uL (ref 0.0–0.5)
Eosinophils Relative: 1 %
HCT: 35.1 % — ABNORMAL LOW (ref 39.0–52.0)
Hemoglobin: 11.4 g/dL — ABNORMAL LOW (ref 13.0–17.0)
Immature Granulocytes: 2 %
Lymphocytes Relative: 10 %
Lymphs Abs: 1.8 10*3/uL (ref 0.7–4.0)
MCH: 29.4 pg (ref 26.0–34.0)
MCHC: 32.5 g/dL (ref 30.0–36.0)
MCV: 90.5 fL (ref 80.0–100.0)
Monocytes Absolute: 1.2 10*3/uL — ABNORMAL HIGH (ref 0.1–1.0)
Monocytes Relative: 6 %
Neutro Abs: 14.5 10*3/uL — ABNORMAL HIGH (ref 1.7–7.7)
Neutrophils Relative %: 80 %
Platelets: 343 10*3/uL (ref 150–400)
RBC: 3.88 MIL/uL — ABNORMAL LOW (ref 4.22–5.81)
RDW: 14.2 % (ref 11.5–15.5)
WBC: 18.2 10*3/uL — ABNORMAL HIGH (ref 4.0–10.5)
nRBC: 0 % (ref 0.0–0.2)

## 2019-10-27 LAB — BASIC METABOLIC PANEL
Anion gap: 13 (ref 5–15)
BUN: 34 mg/dL — ABNORMAL HIGH (ref 8–23)
CO2: 24 mmol/L (ref 22–32)
Calcium: 8.5 mg/dL — ABNORMAL LOW (ref 8.9–10.3)
Chloride: 99 mmol/L (ref 98–111)
Creatinine, Ser: 1.31 mg/dL — ABNORMAL HIGH (ref 0.61–1.24)
GFR calc Af Amer: >60 mL/min (ref >60)
GFR calc non Af Amer: 56 mL/min — ABNORMAL LOW (ref >60)
Glucose, Bld: 121 mg/dL — ABNORMAL HIGH (ref 70–99)
Potassium: 4 mmol/L (ref 3.5–5.1)
Sodium: 136 mmol/L (ref 135–145)

## 2019-10-27 LAB — MAGNESIUM: Magnesium: 2.2 mg/dL (ref 1.7–2.4)

## 2019-10-27 MED ORDER — MENTHOL 3 MG MT LOZG
1.0000 | LOZENGE | OROMUCOSAL | Status: DC | PRN
  Administered 2019-10-29: 04:00:00 3 mg via ORAL
  Filled 2019-10-27: qty 9

## 2019-10-27 NOTE — Progress Notes (Signed)
Rehab Admissions Coordinator Note:  Patient was screened by Cleatrice Burke for appropriateness for an Inpatient Acute Rehab Consult per PT recs.  At this time, we are recommending Inpatient Rehab consult. I will place order.  Cleatrice Burke RN MSN 10/27/2019, 4:49 PM  I can be reached at [phone].

## 2019-10-27 NOTE — Plan of Care (Signed)
  Problem: Activity: Goal: Capacity to carry out activities will improve Outcome: Not Progressing  Pt. Continues to complain of pain in L leg. Unable to complete task without assistance.

## 2019-10-27 NOTE — Progress Notes (Signed)
Lower extremity venous has been completed.   Preliminary results in CV Proc.   Joel Dominguez 10/27/2019 3:24 PM

## 2019-10-27 NOTE — Progress Notes (Signed)
Patient has home CPAP machine and placed himself on with some assistance plugging it in and placing it at his bedside.

## 2019-10-27 NOTE — Progress Notes (Signed)
OT Cancellation Note  Patient Details Name: Detavious Bainbridge MRN: [id number] DOB: 01-14-1951   Cancelled Treatment:    Reason Eval/Treat Not Completed: Other (comment). Mechanical issues currently with hospital bed. Pt with bed stuck in highest position. Unable to safely attempt transfers this session. Pt reports someone has spoken with him about the bed. Notified nursing. Plan to reattempt.  Tyrone Schimke, OT Acute Rehabilitation Services Pager: [phone] Office: [phone]  10/27/2019, 12:41 PM

## 2019-10-27 NOTE — Evaluation (Signed)
Physical Therapy Evaluation Patient Details Name: Joel Dominguez MRN: [id number] DOB: Apr 24, 1951 Today's Date: 10/27/2019   History of Present Illness  68 year old male with history of A. fib on Eliquis, AAS, diastolic CHF, moderate MR, morbid obesity and OSA presented with right leg pain and swelling with blisters, admitted for RLE cellulitis.  Clinical Impression  Pt presents to PT with deficits in functional mobility, gait, balance, strength, power, endurance. Pt requires physical assistance for all functional mobility due to generalized LE weakness. Pt is unable to significantly clear LE in stepping at this time, with significant risk of knee buckling during single leg stance phase of gait bilaterally. Pt will continue to benefit from acute PT POC to improve gait and functional mobility quality and aide in a return to independent mobility.    Follow Up Recommendations CIR;Supervision/Assistance - 24 hour    Equipment Recommendations  Rolling walker with 5" wheels    Recommendations for Other Services Rehab consult     Precautions / Restrictions Precautions Precautions: Fall Restrictions Weight Bearing Restrictions: No      Mobility  Bed Mobility Overal bed mobility: Needs Assistance Bed Mobility: Supine to Sit;Sit to Supine     Supine to sit: Mod assist;HOB elevated Sit to supine: Min assist      Transfers Overall transfer level: Needs assistance Equipment used: Rolling walker (2 wheeled) Transfers: Sit to/from Stand Sit to Stand: Mod assist         General transfer comment: pt performing 4 sit to stands during session  Ambulation/Gait Ambulation/Gait assistance: Mod assist Gait Distance (Feet): 2 Feet Assistive device: Rolling walker (2 wheeled) Gait Pattern/deviations: Shuffle Gait velocity: reduced Gait velocity interpretation: <1.31 ft/sec, indicative of household ambulator General Gait Details: pt with short shuffling side steps toward R side at edge of  bed. Pt also performing pre-gait weight shifting and attempts at marching with minimal foot clearance bilaterally  Stairs            Wheelchair Mobility    Modified Rankin (Stroke Patients Only)       Balance Overall balance assessment: Needs assistance Sitting-balance support: No upper extremity supported;Feet supported Sitting balance-Leahy Scale: Good Sitting balance - Comments: supervision   Standing balance support: Bilateral upper extremity supported Standing balance-Leahy Scale: Poor Standing balance comment: min-modA with BUE support of RW                             Pertinent Vitals/Pain Pain Assessment: Faces Faces Pain Scale: Hurts even more Pain Location: RLE and soles of feet Pain Descriptors / Indicators: Aching Pain Intervention(s): Limited activity within patient's tolerance(RN gave pain meds pre-session)    Home Living Family/patient expects to be discharged to:: Private residence Living Arrangements: Spouse/significant other Available Help at Discharge: Family;Available 24 hours/day Type of Home: House Home Access: Stairs to enter Entrance Stairs-Rails: Left Entrance Stairs-Number of Steps: 3 Home Layout: One level Home Equipment: Cane - quad      Prior Function Level of Independence: Independent with assistive device(s)               Hand Dominance        Extremity/Trunk Assessment   Upper Extremity Assessment Upper Extremity Assessment: Overall WFL for tasks assessed    Lower Extremity Assessment Lower Extremity Assessment: Generalized weakness(RLE with blistering and weeping, wrapped)    Cervical / Trunk Assessment Cervical / Trunk Assessment: Other exceptions(body habitus)  Communication   Communication: No difficulties  Cognition  Arousal/Alertness: Awake/alert Behavior During Therapy: WFL for tasks assessed/performed Overall Cognitive Status: Within Functional Limits for tasks assessed                                         General Comments General comments (skin integrity, edema, etc.): VSS, RA    Exercises     Assessment/Plan    PT Assessment Patient needs continued PT services  PT Problem List Decreased strength;Decreased activity tolerance;Decreased balance;Decreased mobility;Decreased knowledge of use of DME;Decreased safety awareness;Decreased knowledge of precautions;Pain       PT Treatment Interventions DME instruction;Gait training;Stair training;Functional mobility training;Therapeutic activities;Therapeutic exercise;Balance training;Neuromuscular re-education;Patient/family education    PT Goals (Current goals can be found in the Care Plan section)  Acute Rehab PT Goals Patient Stated Goal: To return to prior level of function PT Goal Formulation: With patient Time For Goal Achievement: 11/10/19 Potential to Achieve Goals: Good    Frequency Min 3X/week   Barriers to discharge        Co-evaluation               AM-PAC PT "6 Clicks" Mobility  Outcome Measure Help needed turning from your back to your side while in a flat bed without using bedrails?: A Lot Help needed moving from lying on your back to sitting on the side of a flat bed without using bedrails?: A Lot Help needed moving to and from a bed to a chair (including a wheelchair)?: A Lot Help needed standing up from a chair using your arms (e.g., wheelchair or bedside chair)?: A Lot Help needed to walk in hospital room?: A Lot Help needed climbing 3-5 steps with a railing? : Total 6 Click Score: 11    End of Session Equipment Utilized During Treatment: (none) Activity Tolerance: Patient tolerated treatment well Patient left: in bed;with call bell/phone within reach;with bed alarm set;with family/visitor present Nurse Communication: Mobility status PT Visit Diagnosis: Muscle weakness (generalized) (M62.81)    Time: [id number] PT Time Calculation (min) (ACUTE ONLY): 41  min   Charges:   PT Evaluation $PT Eval Moderate Complexity: 1 Mod PT Treatments $Gait Training: 8-22 mins $Therapeutic Activity: 8-22 mins        Zenaida Niece, PT, DPT Acute Rehabilitation Pager: [phone]   Zenaida Niece 10/27/2019, 4:12 PM

## 2019-10-27 NOTE — Progress Notes (Signed)
PROGRESS NOTE  Joel Dominguez [id number] DOB: 08/09/1951   PCP: Mellody Dance, DO  Patient is from: Home.  DOA: 10/24/2019 LOS: 2  Brief Narrative / Interim history: 68 year old male with history of A. fib on Eliquis, AAS, diastolic CHF, moderate MR, morbid obesity and OSA presented with right leg pain and swelling with blisters.  Noted to be febrile to 100.8 with leukocytosis to 17,000.  Started vancomycin and aztreonam out of concern for cephalosporin allergy.  He was admitted on IV vancomycin.    Patient denies penicillin allergy or cephalosporin allergies.  Blood and urine cultures negative.  Antibiotic deescalated to IV ampicillin the next day.   Subjective: No major events overnight of this morning.  No complaints.  Reports some improvement in his swelling or redness. Pain is fairly controlled.  Denies chest pain, dyspnea, GI or UTI symptoms.  Objective: Vitals:   10/27/19 0623 10/27/19 0751 10/27/19 0900 10/27/19 1102  BP: (!) 106/49 132/64  (!) 143/76  Pulse: 83 (!) 53  94  Resp: 20 20 (!) 22   Temp: 98 F (36.7 C) 98.5 F (36.9 C)  98.1 F (36.7 C)  TempSrc: Oral Oral  Oral  SpO2: 96% 91%  96%  Weight:      Height:        Intake/Output Summary (Last 24 hours) at 10/27/2019 1105 Last data filed at 10/27/2019 1102 Gross per 24 hour  Intake 1549.01 ml  Output 1420 ml  Net 129.01 ml   Filed Weights   10/25/19 0300 10/26/19 0616 10/27/19 0500  Weight: (!) 160.6 kg (!) 160.1 kg (!) 160.6 kg    Examination:  GENERAL: No acute distress.  Appears well.  HEENT: MMM.  Vision and hearing grossly intact.  NECK: Supple.  No apparent JVD.  RESP:  No IWOB. Good air movement bilaterally. CVS:  RRR. Heart sounds normal.  ABD/GI/GU: Bowel sounds present. Soft. Non tender.  MSK/EXT:  Moves extremities. No apparent deformity or edema.  SKIN: Erythematous weeping skin lesion over RLE to his lower thigh.  See picture below for more. NEURO: Awake, alert and oriented  appropriately.  No apparent focal neuro deficit. PSYCH: Calm. Normal affect.    Procedures:  None.  Assessment & Plan: Severe right leg cellulitis: seems to be progressing compared to the image taken on 12/20.  However, leukocytosis and fever curve downtrending.  Lactic acid, blood and urine cultures negative so far.  Hemodynamically stable. -IV vancomycin 12/20-12/22.  Aztreonam 12/20.   -IV ampicillin 12/22>> has taken amoxicillin without problem before. -Appreciate wound care -Continue home diuretics  -Pain control -LE venous Doppler to exclude DVT. -PT/OT  AKI on CKD-3a/azotemia: Improving. Could be combination of prerenal and ATN. Cr 1.2-1.4 (baseline)> 2.04 (10/21/2019)> 1.88 (admit)>> 1.55> 1.31 -Continue monitoring  Chronic diastolic CHF/pulmonary HTN: appears euvolemic except for significant edema in RLE from cellulitis.  About 1.4 L UOP -Continue Bumex at 2 mg daily- twice daily at home. -Monitor fluid status, renal function and electrolytes -Sodium and fluid restriction.  Paroxysmal A. Fib: Rate controlled on Coreg.  On Eliquis for anticoagulation. -Continue home medications  History of CVA without residual deficit: Stable -Continue home medications.  Essential hypertension: Normotensive. -Continue Coreg and Bumex. -Continue holding my Cardizem and Aldactone.  Anemia of chronic disease: H&H relatively stable. -Continue monitoring  History of OSA -Nightly CPAP  Morbid obesity: BMI 48. -Encourage lifestyle change to lose weight.  Marland Kitchen                DVT  prophylaxis: On Eliquis for A. fib. Code Status: Full code Family Communication: Updated patient's over the phone from bedside on 12/22. Disposition Plan: Remains inpatient Consultants: None   Microbiology summarized: [id number] negative. Blood cultures negative. Urine cultures negative.  Sch Meds:  Scheduled Meds: . apixaban  5 mg Oral BID  . bumetanide  2 mg Oral Daily  . carvedilol  25 mg  Oral BID WC   Continuous Infusions: . sodium chloride 250 mL (10/26/19 1204)  . ampicillin (OMNIPEN) IV 2 g (10/27/19 0925)   PRN Meds:.sodium chloride, acetaminophen, cyclobenzaprine, HYDROmorphone (DILAUDID) injection  Antimicrobials: Anti-infectives (From admission, onward)   Start     Dose/Rate Route Frequency Ordered Stop   10/26/19 1200  ampicillin (OMNIPEN) 2 g in sodium chloride 0.9 % 100 mL IVPB     2 g 300 mL/hr over 20 Minutes Intravenous Every 4 hours 10/26/19 1009     10/25/19 1500  vancomycin (VANCOREADY) IVPB 1500 mg/300 mL  Status:  Discontinued     1,500 mg 150 mL/hr over 120 Minutes Intravenous Every 24 hours 10/24/19 1354 10/26/19 1009   10/24/19 2200  aztreonam (AZACTAM) 2 g in sodium chloride 0.9 % 100 mL IVPB  Status:  Discontinued     2 g 200 mL/hr over 30 Minutes Intravenous Every 8 hours 10/24/19 1354 10/25/19 0956   10/24/19 1300  vancomycin (VANCOCIN) 2,500 mg in sodium chloride 0.9 % 500 mL IVPB     2,500 mg 250 mL/hr over 120 Minutes Intravenous  Once 10/24/19 1259 10/24/19 1642   10/24/19 1245  vancomycin (VANCOCIN) IVPB 1000 mg/200 mL premix  Status:  Discontinued     1,000 mg 200 mL/hr over 60 Minutes Intravenous  Once 10/24/19 1240 10/24/19 1259   10/24/19 1245  aztreonam (AZACTAM) 2 g in sodium chloride 0.9 % 100 mL IVPB     2 g 200 mL/hr over 30 Minutes Intravenous  Once 10/24/19 1240 10/24/19 1819       I have personally reviewed the following labs and images: CBC: Recent Labs  Lab 10/21/19 1322 10/24/19 1300 10/25/19 0543 10/26/19 0449 10/27/19 0413  WBC 17.3* 17.1* 22.1* 19.0* 18.2*  NEUTROABS 16.0* 14.6* 18.5*  --  14.5*  HGB 13.8 12.2* 11.4* 10.6* 11.4*  HCT 42.3 37.2* 34.2* 32.8* 35.1*  MCV 90.0 89.2 89.1 90.1 90.5  PLT 252 232 240 266 343   BMP &GFR Recent Labs  Lab 10/21/19 1322 10/24/19 1300 10/25/19 0718 10/26/19 0449 10/27/19 0413  NA 135 134* 137 135 136  K 4.3 4.0 4.0 3.7 4.0  CL 98 97* 100 98 99  CO2 24 24 25  24 24   GLUCOSE 139* 135* 121* 123* 121*  BUN 30* 49* 45* 42* 34*  CREATININE 2.04* 1.88* 1.75* 1.50* 1.31*  CALCIUM 8.7* 8.5* 8.3* 8.4* 8.5*  MG  --   --   --   --  2.2   Estimated Creatinine Clearance: 84.6 mL/min (A) (by C-G formula based on SCr of 1.31 mg/dL (H)). Liver & Pancreas: Recent Labs  Lab 10/21/19 1322 10/24/19 1300  AST 28 34  ALT 21 26  ALKPHOS 78 70  BILITOT 1.2 1.2  PROT 7.8 7.6  ALBUMIN 3.8 3.0*   No results for input(s): LIPASE, AMYLASE in the last 168 hours. No results for input(s): AMMONIA in the last 168 hours. Diabetic: No results for input(s): HGBA1C in the last 72 hours. No results for input(s): GLUCAP in the last 168 hours. Cardiac Enzymes: No results for input(s): CKTOTAL,  CKMB, CKMBINDEX, TROPONINI in the last 168 hours. No results for input(s): PROBNP in the last 8760 hours. Coagulation Profile: Recent Labs  Lab 10/24/19 1300  INR 2.0*   Thyroid Function Tests: No results for input(s): TSH, T4TOTAL, FREET4, T3FREE, THYROIDAB in the last 72 hours. Lipid Profile: No results for input(s): CHOL, HDL, LDLCALC, TRIG, CHOLHDL, LDLDIRECT in the last 72 hours. Anemia Panel: No results for input(s): VITAMINB12, FOLATE, FERRITIN, TIBC, IRON, RETICCTPCT in the last 72 hours. Urine analysis:    Component Value Date/Time   COLORURINE AMBER (A) 10/21/2019 1311   APPEARANCEUR HAZY (A) 10/21/2019 1311   APPEARANCEUR Clear 08/28/2018 1150   LABSPEC 1.017 10/21/2019 1311   PHURINE 5.0 10/21/2019 1311   GLUCOSEU NEGATIVE 10/21/2019 1311   HGBUR NEGATIVE 10/21/2019 1311   BILIRUBINUR NEGATIVE 10/21/2019 1311   BILIRUBINUR Negative 08/28/2018 1150   KETONESUR NEGATIVE 10/21/2019 1311   PROTEINUR NEGATIVE 10/21/2019 1311   NITRITE NEGATIVE 10/21/2019 1311   LEUKOCYTESUR NEGATIVE 10/21/2019 1311   Sepsis Labs: Invalid input(s): PROCALCITONIN, [place]  Microbiology: Recent Results (from the past 240 hour(s))  SARS CORONAVIRUS 2 (TAT 6-24 HRS)  Nasopharyngeal Nasopharyngeal Swab     Status: None   Collection Time: 10/21/19  1:51 PM   Specimen: Nasopharyngeal Swab  Result Value Ref Range Status   SARS Coronavirus 2 NEGATIVE NEGATIVE Final    Comment: (NOTE) SARS-CoV-2 target nucleic acids are NOT DETECTED. The SARS-CoV-2 RNA is generally detectable in upper and lower respiratory specimens during the acute phase of infection. Negative results do not preclude SARS-CoV-2 infection, do not rule out co-infections with other pathogens, and should not be used as the sole basis for treatment or other patient management decisions. Negative results must be combined with clinical observations, patient history, and epidemiological information. The expected result is Negative. Fact Sheet for Patients: SugarRoll.be Fact Sheet for Healthcare Providers: https://www.woods-mathews.com/ This test is not yet approved or cleared by the [place] FDA and  has been authorized for detection and/or diagnosis of SARS-CoV-2 by FDA under an Emergency Use Authorization (EUA). This EUA will remain  in effect (meaning this test can be used) for the duration of the COVID-19 declaration under Section 56 4(b)(1) of the Act, 21 U.S.C. section 360bbb-3(b)(1), unless the authorization is terminated or revoked sooner. Performed at [facility], [place] [address] [place], [place] [address]   Blood Culture (routine x 2)     Status: None (Preliminary result)   Collection Time: 10/24/19  1:00 PM   Specimen: BLOOD LEFT HAND  Result Value Ref Range Status   Specimen Description BLOOD LEFT HAND  Final   Special Requests   Final    BOTTLES DRAWN AEROBIC AND ANAEROBIC Blood Culture adequate volume   Culture   Final    NO GROWTH 3 DAYS Performed at [facility], [place] [address], [place], [place] [address]    Report Status PENDING  Incomplete  SARS CORONAVIRUS 2 (TAT 6-24 HRS) Nasopharyngeal Nasopharyngeal Swab      Status: None   Collection Time: 10/24/19  1:05 PM   Specimen: Nasopharyngeal Swab  Result Value Ref Range Status   SARS Coronavirus 2 NEGATIVE NEGATIVE Final    Comment: (NOTE) SARS-CoV-2 target nucleic acids are NOT DETECTED. The SARS-CoV-2 RNA is generally detectable in upper and lower respiratory specimens during the acute phase of infection. Negative results do not preclude SARS-CoV-2 infection, do not rule out co-infections with other pathogens, and should not be used as the sole basis  for treatment or other patient management decisions. Negative results must be combined with clinical observations, patient history, and epidemiological information. The expected result is Negative. Fact Sheet for Patients: SugarRoll.be Fact Sheet for Healthcare Providers: https://www.woods-mathews.com/ This test is not yet approved or cleared by the [place] FDA and  has been authorized for detection and/or diagnosis of SARS-CoV-2 by FDA under an Emergency Use Authorization (EUA). This EUA will remain  in effect (meaning this test can be used) for the duration of the COVID-19 declaration under Section 56 4(b)(1) of the Act, 21 U.S.C. section 360bbb-3(b)(1), unless the authorization is terminated or revoked sooner. Performed at [facility], [place] [address], [place], [place] [address]   Blood Culture (routine x 2)     Status: None (Preliminary result)   Collection Time: 10/24/19  2:45 PM   Specimen: BLOOD RIGHT HAND  Result Value Ref Range Status   Specimen Description BLOOD RIGHT HAND  Final   Special Requests   Final    BOTTLES DRAWN AEROBIC AND ANAEROBIC Blood Culture adequate volume   Culture   Final    NO GROWTH 3 DAYS Performed at [facility], [place] [address], [place], [place] [address]    Report Status PENDING  Incomplete  Urine culture     Status: None   Collection Time: 10/24/19  8:20 PM   Specimen: In/Out Cath Urine   Result Value Ref Range Status   Specimen Description IN/OUT CATH URINE  Final   Special Requests NONE  Final   Culture   Final    NO GROWTH Performed at [facility], [place] [address] [place], [place] [address]    Report Status 10/26/2019 FINAL  Final    Radiology Studies: No results found.   Taye T. [place]  If 7PM-7AM, please contact night-coverage [email or web address] Password [email or web address] 10/27/2019, 11:05 AM

## 2019-10-28 ENCOUNTER — Inpatient Hospital Stay (HOSPITAL_COMMUNITY): Payer: Medicare Other

## 2019-10-28 DIAGNOSIS — I509 Heart failure, unspecified: Secondary | ICD-10-CM

## 2019-10-28 DIAGNOSIS — I4811 Longstanding persistent atrial fibrillation: Secondary | ICD-10-CM

## 2019-10-28 LAB — BASIC METABOLIC PANEL
Anion gap: 14 (ref 5–15)
BUN: 27 mg/dL — ABNORMAL HIGH (ref 8–23)
CO2: 27 mmol/L (ref 22–32)
Calcium: 8.6 mg/dL — ABNORMAL LOW (ref 8.9–10.3)
Chloride: 98 mmol/L (ref 98–111)
Creatinine, Ser: 1.23 mg/dL (ref 0.61–1.24)
GFR calc Af Amer: >60 mL/min (ref >60)
GFR calc non Af Amer: 60 mL/min — ABNORMAL LOW (ref >60)
Glucose, Bld: 123 mg/dL — ABNORMAL HIGH (ref 70–99)
Potassium: 4.1 mmol/L (ref 3.5–5.1)
Sodium: 139 mmol/L (ref 135–145)

## 2019-10-28 LAB — CBC
HCT: 34.8 % — ABNORMAL LOW (ref 39.0–52.0)
Hemoglobin: 11.1 g/dL — ABNORMAL LOW (ref 13.0–17.0)
MCH: 29.1 pg (ref 26.0–34.0)
MCHC: 31.9 g/dL (ref 30.0–36.0)
MCV: 91.3 fL (ref 80.0–100.0)
Platelets: 349 10*3/uL (ref 150–400)
RBC: 3.81 MIL/uL — ABNORMAL LOW (ref 4.22–5.81)
RDW: 14 % (ref 11.5–15.5)
WBC: 20.7 10*3/uL — ABNORMAL HIGH (ref 4.0–10.5)
nRBC: 0 % (ref 0.0–0.2)

## 2019-10-28 LAB — MAGNESIUM: Magnesium: 2.2 mg/dL (ref 1.7–2.4)

## 2019-10-28 MED ORDER — MORPHINE SULFATE (PF) 2 MG/ML IV SOLN
2.0000 mg | INTRAVENOUS | Status: DC | PRN
  Administered 2019-10-28 – 2019-10-30 (×12): 2 mg via INTRAVENOUS
  Filled 2019-10-28 (×12): qty 1

## 2019-10-28 MED ORDER — BUMETANIDE 2 MG PO TABS
2.0000 mg | ORAL_TABLET | Freq: Two times a day (BID) | ORAL | Status: DC
  Administered 2019-10-28 – 2019-11-08 (×21): 2 mg via ORAL
  Filled 2019-10-28 (×22): qty 1

## 2019-10-28 MED ORDER — LINEZOLID 600 MG PO TABS: 600.0000 mg | ORAL_TABLET | Freq: Two times a day (BID) | ORAL | Status: DC

## 2019-10-28 MED ORDER — LINEZOLID 600 MG PO TABS
600.0000 mg | ORAL_TABLET | Freq: Two times a day (BID) | ORAL | Status: DC
  Administered 2019-10-28 – 2019-11-09 (×25): 600 mg via ORAL
  Filled 2019-10-28 (×26): qty 1

## 2019-10-28 MED ORDER — LINEZOLID 600 MG/300ML IV SOLN: 600.0000 mg | Freq: Two times a day (BID) | INTRAVENOUS | Status: DC

## 2019-10-28 MED ORDER — SACCHAROMYCES BOULARDII 250 MG PO CAPS: 250.0000 mg | ORAL_CAPSULE | Freq: Two times a day (BID) | ORAL | Status: DC

## 2019-10-28 NOTE — Progress Notes (Signed)
Pt has his home CPAP machine. Pt is able to place himself on without assistance. RT will continue to monitor.

## 2019-10-28 NOTE — Progress Notes (Signed)
      INFECTIOUS DISEASE ATTENDING ADDENDUM:   Date: 10/28/2019  Patient name: Joel Dominguez  Medical record number: [id number]  Date of birth: May 21, 1951   I was called re this morbidly obese patient with non purulent cellulitis I have  Recommended change to po zyvox given excellent bio-availability and abilit of the drug to inhibit toxins if that might be helpful  Elevation of the leg and patience are the only other interventions that I would recommend. My experience with these patients is that they invariably take [facility] longer to get better and in fact have worsening of their cellulitis in the first days.     Rhina Brackett [place] 10/28/2019, 3:02 PM

## 2019-10-28 NOTE — Progress Notes (Signed)
Pt exhibited nightmare with dilaudid earlier on the shift. Will notify MD to discontinue the order as patient is no longer willing to take it.

## 2019-10-28 NOTE — Consult Note (Signed)
[place] Nurse Consult Note: Patient receiving care in [place].  Consult completed remotely after speaking with primary RN Taiwo Tijani.  Reason for Consult: "sacrum area wound"  Taiwo explained that nightshift had placed the [place] consult. No entry in the flowsheet section.  She then went to the bedside for an assessment with me on the phone with her. Wound type: What is described is MASD IT in the medial gluteal fold, along the length of the fold. Pressure Injury POA: Yes/No/NA Measurement: Wound bed: red Drainage (amount, consistency, odor) moist from body moisture Periwound: Dressing procedure/placement/frequency: Cut to fit Aquacel into the medial gluteal fold, cover with a foam dressing.  Change daily. Monitor the wound area(s) for worsening of condition such as: Signs/symptoms of infection,  Increase in size,  Development of or worsening of odor, Development of pain, or increased pain at the affected locations.  Notify the medical team if any of these develop.  Thank you for the consult.  Discussed plan of care with the bedside nurse.  [place] nurse will not follow at this time.  Please re-consult the [place] team if needed.  Val Riles, RN, MSN, CWOCN, CNS-BC, pager [phone]

## 2019-10-28 NOTE — Plan of Care (Signed)
  Problem: Education: Goal: Ability to demonstrate management of disease process will improve Outcome: Progressing   Problem: Education: Goal: Ability to verbalize understanding of medication therapies will improve Outcome: Progressing   Problem: Education: Goal: Individualized Educational Video(s) Outcome: Progressing

## 2019-10-28 NOTE — Evaluation (Signed)
Occupational Therapy Evaluation Patient Details Name: Joel Dominguez MRN: [id number] DOB: 16-Jun-1951 Today's Date: 10/28/2019    History of Present Illness 68 year old male with history of A. fib on Eliquis, AAS, diastolic CHF, moderate MR, morbid obesity and OSA presented with right leg pain and swelling with blisters, admitted for RLE cellulitis.   Clinical Impression   Patient is a 68 year old male that lives with his spouse in a single level home with 3 STE. At baseline patient requires assist with LB dressing from spouse, otherwise independent. Currently patient requires min to mod A for bed mobility, mod A with cues for safety + body mechanics for sit to stand and side stepping to head of bed. Recommend continued acute OT services to maximize patient safety and independence with self care.    Follow Up Recommendations  CIR;Supervision/Assistance - 24 hour    Equipment Recommendations  Other (comment)(defer to next venue)       Precautions / Restrictions Precautions Precautions: Fall Restrictions Weight Bearing Restrictions: No      Mobility Bed Mobility Overal bed mobility: Needs Assistance Bed Mobility: Supine to Sit;Sit to Supine     Supine to sit: HOB elevated;Min guard Sit to supine: Mod assist   General bed mobility comments: min guard for safety to sit with heavy use of bed rails, mod A to lift LEs onto bed  Transfers Overall transfer level: Needs assistance Equipment used: Rolling walker (2 wheeled) Transfers: Sit to/from Stand Sit to Stand: Mod assist         General transfer comment: assist to power up to standing with moderate loss of balance requiring cues and mod A to gain static standing     Balance Overall balance assessment: Needs assistance Sitting-balance support: No upper extremity supported;Feet supported Sitting balance-Leahy Scale: Fair Sitting balance - Comments: close supervision-min G   Standing balance support: During functional  activity;Bilateral upper extremity supported Standing balance-Leahy Scale: Poor Standing balance comment: mod A with B UE support of RW                           ADL either performed or assessed with clinical judgement   ADL Overall ADL's : Needs assistance/impaired Eating/Feeding: Independent   Grooming: Oral care;Wash/dry hands;Wash/dry face;Sitting;Supervision/safety;Min guard Grooming Details (indicate cue type and reason): difficulty with prolonged unsupported sitting Upper Body Bathing: Supervision/ safety;Min guard;Sitting   Lower Body Bathing: Maximal assistance;Sitting/lateral leans;Sit to/from stand   Upper Body Dressing : Supervision/safety;Min guard;Sitting   Lower Body Dressing: Maximal assistance;Sitting/lateral leans;Sit to/from stand   Toilet Transfer: Moderate assistance;Cueing for safety;Ambulation;BSC;RW Toilet Transfer Details (indicate cue type and reason): simulated with functional mobility, assist to power up and cues for body mechanics Toileting- Clothing Manipulation and Hygiene: Moderate assistance;Sitting/lateral lean;Sit to/from stand       Functional mobility during ADLs: Moderate assistance;Rolling walker;Cueing for safety General ADL Comments: due to increased pain, decreased activity tolerance and strength pt requiring increased assistance for self care than baseline                  Pertinent Vitals/Pain Pain Assessment: Faces Faces Pain Scale: Hurts even more Pain Location: RLE and soles of feet Pain Descriptors / Indicators: Aching Pain Intervention(s): Limited activity within patient's tolerance;Monitored during session;Repositioned     Hand Dominance Right   Extremity/Trunk Assessment Upper Extremity Assessment Upper Extremity Assessment: Overall WFL for tasks assessed   Lower Extremity Assessment Lower Extremity Assessment: Defer to PT evaluation  Communication Communication Communication: No difficulties    Cognition Arousal/Alertness: Awake/alert Behavior During Therapy: WFL for tasks assessed/performed Overall Cognitive Status: Within Functional Limits for tasks assessed                                     General Comments  HR 103 seated EOB            Home Living Family/patient expects to be discharged to:: Private residence Living Arrangements: Spouse/significant other Available Help at Discharge: Family;Available 24 hours/day Type of Home: House Home Access: Stairs to enter [employer] of Steps: 3 Entrance Stairs-Rails: Left Home Layout: One level     Bathroom Shower/Tub: Occupational psychologist: Handicapped height     Home Equipment: [place] - quad;Shower seat;Grab bars - tub/shower          Prior Functioning/Environment Level of Independence: Needs assistance  Gait / Transfers Assistance Needed: modified independent with quad cane ADL's / Homemaking Assistance Needed: spouse assist with LB dressing such as socks, otherwise mostly I with self care   Comments: drives        OT Problem List: Decreased activity tolerance;Impaired balance (sitting and/or standing);Decreased safety awareness;Obesity;Pain;Increased edema      OT Treatment/Interventions: Self-care/ADL training;Therapeutic exercise;DME and/or AE instruction;Therapeutic activities;Patient/family education;Balance training    OT Goals(Current goals can be found in the care plan section) Acute Rehab OT Goals Patient Stated Goal: To return to prior level of function OT Goal Formulation: With patient Time For Goal Achievement: 11/11/19 Potential to Achieve Goals: Good  OT Frequency: Min 2X/week    AM-PAC OT "6 Clicks" Daily Activity     Outcome Measure Help from another person eating meals?: None Help from another person taking care of personal grooming?: A Little Help from another person toileting, which includes using toliet, bedpan, or urinal?: A Lot Help from  another person bathing (including washing, rinsing, drying)?: A Lot Help from another person to put on and taking off regular upper body clothing?: A Little Help from another person to put on and taking off regular lower body clothing?: A Lot 6 Click Score: 16   End of Session Equipment Utilized During Treatment: Rolling walker Nurse Communication: Mobility status  Activity Tolerance: Patient tolerated treatment well Patient left: in bed;with call bell/phone within reach  OT Visit Diagnosis: Unsteadiness on feet (R26.81);Other abnormalities of gait and mobility (R26.89);Pain Pain - Right/Left: Right Pain - part of body: Leg;Ankle and joints of foot                Time: [id number] OT Time Calculation (min): 25 min Charges:  OT General Charges $OT Visit: 1 Visit OT Evaluation $OT Eval Moderate Complexity: 1 Mod OT Treatments $Self Care/Home Management : 8-22 mins  Shon Millet OT OT office: [place] 10/28/2019, 11:41 AM

## 2019-10-28 NOTE — Progress Notes (Signed)
Inpatient Rehabilitation Admissions Coordinator  Inpatient rehab consult received. Pt not medically ready for dispo . Working with therapy. I will follow his progress to assist with planing dispo when appropriate on Monday if pt remains in house.  Danne Baxter, RN, MSN Rehab Admissions Coordinator [phone] 10/28/2019 10:39 AM

## 2019-10-28 NOTE — Progress Notes (Signed)
PROGRESS NOTE    Joel Dominguez  [id number] DOB: November 05, 1950 DOA: 10/24/2019 PCP: Mellody Dance, DO    Brief Narrative: 68 year old male with history of A. fib on Eliquis, AAS, diastolic CHF, moderate MR, morbid obesity and OSA presented with right leg pain and swelling with blisters.  Noted to be febrile to 100.8 with leukocytosis to 17,000.  Started vancomycin and aztreonam out of concern for cephalosporin allergy.  He was admitted on IV vancomycin.    Patient denies penicillin allergy or cephalosporin allergies.  Blood and urine cultures negative.  Antibiotic deescalated to IV ampicillin the next day.    Assessment & Plan:   Active Problems:   Cellulitis  Severe right leg cellulitis: Cellulitis spreading to the right upper thigh.  Still with leukocytosis. -IV vancomycin 12/20-12/22.  Aztreonam 12/20.   -IV ampicillin 12/22>>12/24 -Appreciate wound care -Continue home diuretics  but increase to twice a day of Demadex. -Pain control DC Dilaudid start morphine -LE venous Doppler to exclude DVT.  No evidence of DVT. -PT/OT -Elevate right leg while in bed -Discussed with ID to start Zyvox -CT right lower extremity  AKI on CKD-3a/azotemia: Improving. Could be combination of prerenal and ATN. Cr 1.2-1.4 (baseline)> 2.04 (10/21/2019)> 1.88 (admit)>> 1.55> 1.31.  Creatinine 1.23 today. Improving.  Chronic diastolic CHF/pulmonary HTN: appears euvolemic except for significant edema in RLE from cellulitis.  About 1.4 L UOP.  Increase Bumex to 2 mg twice a day to decrease swelling in the right lower extremity.  Paroxysmal A. Fib: Rate controlled on Coreg.  On Eliquis for anticoagulation. -Continue home medications  History of CVA without residual deficit: Stable -Continue home medications.  Essential hypertension-  148/77. -Continue Coreg and Bumex. -Continue holding my Cardizem and Aldactone.  Anemia of chronic disease: H&H relatively stable.  Hemoglobin  11.1. -Continue monitoring  History of OSA -Nightly CPAP  Morbid obesity: BMI 48. -Encourage lifestyle change to lose weight.   Estimated body mass index is 50.05 kg/m as calculated from the following:   Height as of this encounter: 6' (1.829 m).   Weight as of this encounter: 167.4 kg.   DVT prophylaxis: On Eliquis for A. fib. Code Status: Full code Family Communication:  Discussed with patient's wife over the phone  disposition Plan: Remains inpatient Consultants: None   Subjective: Patient and wife very concerned that the infection is not getting better. Patient complains of severe right lower extremity pain got Dilaudid last night and had nightmares from it Wife thinks he can take morphine   Objective: Vitals:   10/28/19 0400 10/28/19 0430 10/28/19 0500 10/28/19 0533  BP:    (!) 148/77  Pulse:    89  Resp: (!) 26 17 (!) 25 20  Temp:    98.5 F (36.9 C)  TempSrc:    Oral  SpO2:    97%  Weight: (!) 167.4 kg     Height:        Intake/Output Summary (Last 24 hours) at 10/28/2019 1459 Last data filed at 10/28/2019 1300 Gross per 24 hour  Intake 670.32 ml  Output 1400 ml  Net -729.68 ml   Filed Weights   10/26/19 0616 10/27/19 0500 10/28/19 0400  Weight: (!) 160.1 kg (!) 160.6 kg (!) 167.4 kg    Examination:  General exam: Appears calm and comfortable  Respiratory system: Clear to auscultation. Respiratory effort normal. Cardiovascular system: S1 & S2 heard, RRR. No JVD, murmurs, rubs, gallops or clicks. No pedal edema. Gastrointestinal system: Abdomen is nondistended, soft and nontender.  No organomegaly or masses felt. Normal bowel sounds heard. Central nervous system: Alert and oriented. No focal neurological deficits. Extremities right lower extremity edema and erythema spreading to the right upper thighs  skin: No rashes, lesions or ulcers Psychiatry: Judgement and insight appear normal. Mood & affect appropriate.     Data Reviewed: I have  personally reviewed following labs and imaging studies  CBC: Recent Labs  Lab 10/24/19 1300 10/25/19 0543 10/26/19 0449 10/27/19 0413 10/28/19 0422  WBC 17.1* 22.1* 19.0* 18.2* 20.7*  NEUTROABS 14.6* 18.5*  --  14.5*  --   HGB 12.2* 11.4* 10.6* 11.4* 11.1*  HCT 37.2* 34.2* 32.8* 35.1* 34.8*  MCV 89.2 89.1 90.1 90.5 91.3  PLT 232 240 266 343 0000000   Basic Metabolic Panel: Recent Labs  Lab 10/24/19 1300 10/25/19 0718 10/26/19 0449 10/27/19 0413 10/28/19 0422  NA 134* 137 135 136 139  K 4.0 4.0 3.7 4.0 4.1  CL 97* 100 98 99 98  CO2 24 25 24 24 27   GLUCOSE 135* 121* 123* 121* 123*  BUN 49* 45* 42* 34* 27*  CREATININE 1.88* 1.75* 1.50* 1.31* 1.23  CALCIUM 8.5* 8.3* 8.4* 8.5* 8.6*  MG  --   --   --  2.2 2.2   GFR: Estimated Creatinine Clearance: 92.3 mL/min (by C-G formula based on SCr of 1.23 mg/dL). Liver Function Tests: Recent Labs  Lab 10/24/19 1300  AST 34  ALT 26  ALKPHOS 70  BILITOT 1.2  PROT 7.6  ALBUMIN 3.0*   No results for input(s): LIPASE, AMYLASE in the last 168 hours. No results for input(s): AMMONIA in the last 168 hours. Coagulation Profile: Recent Labs  Lab 10/24/19 1300  INR 2.0*   Cardiac Enzymes: No results for input(s): CKTOTAL, CKMB, CKMBINDEX, TROPONINI in the last 168 hours. BNP (last 3 results) No results for input(s): PROBNP in the last 8760 hours. HbA1C: No results for input(s): HGBA1C in the last 72 hours. CBG: No results for input(s): GLUCAP in the last 168 hours. Lipid Profile: No results for input(s): CHOL, HDL, LDLCALC, TRIG, CHOLHDL, LDLDIRECT in the last 72 hours. Thyroid Function Tests: No results for input(s): TSH, T4TOTAL, FREET4, T3FREE, THYROIDAB in the last 72 hours. Anemia Panel: No results for input(s): VITAMINB12, FOLATE, FERRITIN, TIBC, IRON, RETICCTPCT in the last 72 hours. Sepsis Labs: Recent Labs  Lab 10/24/19 1314 10/24/19 1445  LATICACIDVEN 1.2 1.2    Recent Results (from the past 240 hour(s))   SARS CORONAVIRUS 2 (TAT 6-24 HRS) Nasopharyngeal Nasopharyngeal Swab     Status: None   Collection Time: 10/21/19  1:51 PM   Specimen: Nasopharyngeal Swab  Result Value Ref Range Status   SARS Coronavirus 2 NEGATIVE NEGATIVE Final    Comment: (NOTE) SARS-CoV-2 target nucleic acids are NOT DETECTED. The SARS-CoV-2 RNA is generally detectable in upper and lower respiratory specimens during the acute phase of infection. Negative results do not preclude SARS-CoV-2 infection, do not rule out co-infections with other pathogens, and should not be used as the sole basis for treatment or other patient management decisions. Negative results must be combined with clinical observations, patient history, and epidemiological information. The expected result is Negative. Fact Sheet for Patients: SugarRoll.be Fact Sheet for Healthcare Providers: https://www.woods[employer].com/ This test is not yet approved or cleared by the [place] FDA and  has been authorized for detection and/or diagnosis of SARS-CoV-2 by FDA under an Emergency Use Authorization (EUA). This EUA will remain  in effect (meaning this test can be used)  for the duration of the COVID-19 declaration under Section 56 4(b)(1) of the Act, 21 U.S.C. section 360bbb-3(b)(1), unless the authorization is terminated or revoked sooner. Performed at [facility], [place] [address] [place], [place] [address]   Blood Culture (routine x 2)     Status: None (Preliminary result)   Collection Time: 10/24/19  1:00 PM   Specimen: BLOOD LEFT HAND  Result Value Ref Range Status   Specimen Description BLOOD LEFT HAND  Final   Special Requests   Final    BOTTLES DRAWN AEROBIC AND ANAEROBIC Blood Culture adequate volume   Culture   Final    NO GROWTH 4 DAYS Performed at [facility], [place] [address] [place], [place] [address]    Report Status PENDING  Incomplete  SARS CORONAVIRUS 2 (TAT 6-24 HRS)  Nasopharyngeal Nasopharyngeal Swab     Status: None   Collection Time: 10/24/19  1:05 PM   Specimen: Nasopharyngeal Swab  Result Value Ref Range Status   SARS Coronavirus 2 NEGATIVE NEGATIVE Final    Comment: (NOTE) SARS-CoV-2 target nucleic acids are NOT DETECTED. The SARS-CoV-2 RNA is generally detectable in upper and lower respiratory specimens during the acute phase of infection. Negative results do not preclude SARS-CoV-2 infection, do not rule out co-infections with other pathogens, and should not be used as the sole basis for treatment or other patient management decisions. Negative results must be combined with clinical observations, patient history, and epidemiological information. The expected result is Negative. Fact Sheet for Patients: SugarRoll.be Fact Sheet for Healthcare Providers: https://www.woods[employer].com/ This test is not yet approved or cleared by the [place] FDA and  has been authorized for detection and/or diagnosis of SARS-CoV-2 by FDA under an Emergency Use Authorization (EUA). This EUA will remain  in effect (meaning this test can be used) for the duration of the COVID-19 declaration under Section 56 4(b)(1) of the Act, 21 U.S.C. section 360bbb-3(b)(1), unless the authorization is terminated or revoked sooner. Performed at [facility], [place] [address], [place], [place] [address]   Blood Culture (routine x 2)     Status: None (Preliminary result)   Collection Time: 10/24/19  2:45 PM   Specimen: BLOOD RIGHT HAND  Result Value Ref Range Status   Specimen Description BLOOD RIGHT HAND  Final   Special Requests   Final    BOTTLES DRAWN AEROBIC AND ANAEROBIC Blood Culture adequate volume   Culture   Final    NO GROWTH 4 DAYS Performed at [facility], [place] [address] [place], [place] [address]    Report Status PENDING  Incomplete  Urine culture     Status: None   Collection Time: 10/24/19  8:20 PM    Specimen: In/Out Cath Urine  Result Value Ref Range Status   Specimen Description IN/OUT CATH URINE  Final   Special Requests NONE  Final   Culture   Final    NO GROWTH Performed at [facility], [place] [address], [place],  [address]    Report Status 10/26/2019 FINAL  Final         Radiology Studies: DVT  Result Date: 10/27/2019  Lower Venous Study Indications: Edema, and Swelling.  Limitations: Body habitus and poor ultrasound/tissue interface. Comparison Study: no prior Performing Technologist: Abram Sander RVS  Examination Guidelines: A complete evaluation includes B-mode imaging, spectral Doppler, color Doppler, and power Doppler as needed of all accessible portions of each vessel. Bilateral testing is considered an  integral part of a complete examination. Limited examinations for reoccurring indications may be performed as noted.  +---------+---------------+---------+-----------+----------+-------------------+ RIGHT    CompressibilityPhasicitySpontaneityPropertiesThrombus Aging      +---------+---------------+---------+-----------+----------+-------------------+ CFV      Full                                                             +---------+---------------+---------+-----------+----------+-------------------+ SFJ      Full                                                             +---------+---------------+---------+-----------+----------+-------------------+ FV Prox  Full                                                             +---------+---------------+---------+-----------+----------+-------------------+ FV Mid   Full                                                             +---------+---------------+---------+-----------+----------+-------------------+ FV Distal               Yes      Yes                  unable to visualize                                                       well for                                                                   compression         +---------+---------------+---------+-----------+----------+-------------------+ PFV      Full                                                             +---------+---------------+---------+-----------+----------+-------------------+ POP      Full                                                             +---------+---------------+---------+-----------+----------+-------------------+  PTV      Full                                                             +---------+---------------+---------+-----------+----------+-------------------+ PERO                                                  Not visualized      +---------+---------------+---------+-----------+----------+-------------------+   +---------+---------------+---------+-----------+----------+--------------+ LEFT     CompressibilityPhasicitySpontaneityPropertiesThrombus Aging +---------+---------------+---------+-----------+----------+--------------+ CFV      Full           Yes      Yes                                 +---------+---------------+---------+-----------+----------+--------------+ SFJ      Full                                                        +---------+---------------+---------+-----------+----------+--------------+ FV Prox  Full                                                        +---------+---------------+---------+-----------+----------+--------------+ FV Mid   Full                                                        +---------+---------------+---------+-----------+----------+--------------+ FV DistalFull                                                        +---------+---------------+---------+-----------+----------+--------------+ PFV      Full                                                        +---------+---------------+---------+-----------+----------+--------------+ POP       Full           Yes      Yes                                 +---------+---------------+---------+-----------+----------+--------------+ PTV      Full                                                        +---------+---------------+---------+-----------+----------+--------------+  PERO                                                  Not visualized +---------+---------------+---------+-----------+----------+--------------+     Summary: Right: There is no evidence of deep vein thrombosis in the lower extremity. However, portions of this examination were limited- see technologist comments above. No cystic structure found in the popliteal fossa. Left: There is no evidence of deep vein thrombosis in the lower extremity. No cystic structure found in the popliteal fossa.  *See table(s) above for measurements and observations. Electronically signed by Curt Jews MD on 10/27/2019 at 4:02:21 PM.    Final         Scheduled Meds: . apixaban  5 mg Oral BID  . bumetanide  2 mg Oral BID  . carvedilol  25 mg Oral BID WC  . saccharomyces boulardii  250 mg Oral BID   Continuous Infusions: . sodium chloride 250 mL (10/26/19 1204)  . linezolid (ZYVOX) IV       LOS: 3 days     Georgette Shell, MD [facility]   If 7PM-7AM, please contact night-coverage [email or web address] Password [facility] 10/28/2019, 2:59 PM

## 2019-10-28 NOTE — Care Management Important Message (Signed)
Important Message  Patient Details  Name: Nickles Blackledge MRN: [id number] Date of Birth: [birth year]   Medicare Important Message Given:  Yes     Arney Mayabb 10/28/2019, 11:19 AM

## 2019-10-29 LAB — BASIC METABOLIC PANEL
Anion gap: 10 (ref 5–15)
BUN: 26 mg/dL — ABNORMAL HIGH (ref 8–23)
CO2: 28 mmol/L (ref 22–32)
Calcium: 8.6 mg/dL — ABNORMAL LOW (ref 8.9–10.3)
Chloride: 100 mmol/L (ref 98–111)
Creatinine, Ser: 1.2 mg/dL (ref 0.61–1.24)
GFR calc Af Amer: >60 mL/min (ref >60)
GFR calc non Af Amer: >60 mL/min (ref >60)
Glucose, Bld: 126 mg/dL — ABNORMAL HIGH (ref 70–99)
Potassium: 3.9 mmol/L (ref 3.5–5.1)
Sodium: 138 mmol/L (ref 135–145)

## 2019-10-29 LAB — CULTURE, BLOOD (ROUTINE X 2)
Culture: NO GROWTH
Culture: NO GROWTH
Special Requests: ADEQUATE
Special Requests: ADEQUATE

## 2019-10-29 LAB — CBC
HCT: 33.9 % — ABNORMAL LOW (ref 39.0–52.0)
Hemoglobin: 10.9 g/dL — ABNORMAL LOW (ref 13.0–17.0)
MCH: 29.3 pg (ref 26.0–34.0)
MCHC: 32.2 g/dL (ref 30.0–36.0)
MCV: 91.1 fL (ref 80.0–100.0)
Platelets: 385 10*3/uL (ref 150–400)
RBC: 3.72 MIL/uL — ABNORMAL LOW (ref 4.22–5.81)
RDW: 13.8 % (ref 11.5–15.5)
WBC: 19.3 10*3/uL — ABNORMAL HIGH (ref 4.0–10.5)
nRBC: 0 % (ref 0.0–0.2)

## 2019-10-29 LAB — MAGNESIUM: Magnesium: 1.9 mg/dL (ref 1.7–2.4)

## 2019-10-29 NOTE — Plan of Care (Signed)
  Problem: Education: Goal: Ability to demonstrate management of disease process will improve Outcome: Progressing   Problem: Education: Goal: Ability to verbalize understanding of medication therapies will improve Outcome: Progressing   Problem: Education: Goal: Individualized Educational Video(s) Outcome: Progressing

## 2019-10-29 NOTE — Progress Notes (Signed)
PROGRESS NOTE    Joel Dominguez  [id number] DOB: 12-Apr-1951 DOA: 10/24/2019 PCP: Mellody Dance, DO   Brief Narrative:68 year old male with history of A. fib on Eliquis, AAS, diastolic CHF, moderate MR, morbid obesity and OSA presented with right leg pain and swelling with blisters. Noted to be febrile to 100.8 with leukocytosis to 17,000. Started vancomycin and aztreonam out of concern for cephalosporin allergy. He was admitted on IV vancomycin.   Patient denies penicillin allergy or cephalosporin allergies. Blood and urine cultures negative. Antibiotic deescalated to IV ampicillin the next day.    Assessment & Plan:   Active Problems:   Cellulitis   Chronic congestive heart failure (HCC)   Longstanding persistent atrial fibrillation (HCC)   Severe right leg cellulitis: Cellulitis was spreading to the right upper thigh which has improved after starting him on Zyvox. Still with leukocytosis. -IV vancomycin 12/20-12/22. Aztreonam 12/20.  -IV ampicillin 12/22>>12/24 -Zyvox 12/24 > -Appreciate wound care Continue Demadex twice a day instead of once a day to decrease swelling.  He was on twice a day at home. He seems to be tolerating morphine for pain control has had no hallucination or nightmares with the morphine. Lower extremity Doppler showed no evidence of DVT. Patient seen by therapy and recommended CIR. CIR will reevaluate on Monday for appropriateness. Discussed with ID. CT of the right lower extremity shows no evidence of deep abscess.  Superficial fluid collection over the posterior medial lower extremity which could represent a phlegmon or early superficial abscess.  AKI on CKD-3a/azotemia: Improving. Could be combination of prerenal and ATN. Cr 1.2-1.4 (baseline)> 2.04 (10/21/2019)> 1.88 (admit)>> 1.55>1.31.  Creatinine 1.20 today. Improving.  Chronic diastolic CHF/pulmonary HTN: appears euvolemic except for significant edema in RLE from cellulitis.About  1.4 L UOP.  Increase Bumex to 2 mg twice a day to decrease swelling in the right lower extremity. Negative by 3.5 L.  Paroxysmal A. Fib: Rate controlled on Coreg. On Eliquis for anticoagulation. -Continue home medications  History of CVA without residual deficit: Stable -Continue home medications.  Essential hypertension- 123/52 watch closely on Coreg and Bumex.-Continue Coreg and Bumex. -Continue to  hold  Cardizem and Aldactone.  Anemia of chronic disease: H&H relatively stable.  Hemoglobin 10.9 from 11.7 from 11.4. -Continue monitoring  History of OSA -Nightly CPAP  Morbid obesity: BMI 48. -Encourage lifestyle change to lose weight.   Pressure Injury 10/28/19 Buttocks Right;Left;Mid Deep Tissue Pressure Injury - Purple or maroon localized area of discolored intact skin or blood-filled blister due to damage of underlying soft tissue from pressure and/or shear.  (Active)  10/28/19 0800  Location: Buttocks  Location Orientation: Right;Left;Mid  Staging: Deep Tissue Pressure Injury - Purple or maroon localized area of discolored intact skin or blood-filled blister due to damage of underlying soft tissue from pressure and/or shear.  Wound Description (Comments):   Present on Admission:     Estimated body mass index is 49.23 kg/m as calculated from the following:   Height as of this encounter: 6' (1.829 m).   Weight as of this encounter: 164.7 kg.  DVT prophylaxis:On Eliquis for A. fib. Code Status:Full code Family Communication: Discussed with patient's wife over the phone  disposition Plan:Remains inpatient Consultants:None   Subjective: Resting in bed awake and alert feels his leg is getting better decreased edema decreased erythema compared to yesterday.  Objective: Vitals:   10/28/19 2300 10/29/19 0505 10/29/19 0853 10/29/19 1147  BP: (!) 147/70  (!) 147/70 (!) 123/52  Pulse: 86  66 78  Resp: 20  18 17   Temp: 98.4 F (36.9 C)   97.7 F (36.5 C)    TempSrc:      SpO2: 94%  91% 96%  Weight:  (!) 164.7 kg    Height:        Intake/Output Summary (Last 24 hours) at 10/29/2019 1148 Last data filed at 10/29/2019 1040 Gross per 24 hour  Intake 960 ml  Output 2000 ml  Net -1040 ml   Filed Weights   10/27/19 0500 10/28/19 0400 10/29/19 0505  Weight: (!) 160.6 kg (!) 167.4 kg (!) 164.7 kg    Examination:  General exam: Appears calm and comfortable  Respiratory system: Clear to auscultation. Respiratory effort normal. Cardiovascular system: S1 & S2 heard, RRR. No JVD, murmurs, rubs, gallops or clicks. No pedal edema. Gastrointestinal system: Abdomen is nondistended, soft and nontender. No organomegaly or masses felt. Normal bowel sounds heard. Central nervous system: Alert and oriented. No focal neurological deficits. Extremities: Decreased edema and erythema to the right lower extremity still with 2-3+ edema. Skin: No rashes, lesions or ulcers Psychiatry: Judgement and insight appear normal. Mood & affect appropriate.     Data Reviewed: I have personally reviewed following labs and imaging studies  CBC: Recent Labs  Lab 10/24/19 1300 10/25/19 0543 10/26/19 0449 10/27/19 0413 10/28/19 0422 10/29/19 0328  WBC 17.1* 22.1* 19.0* 18.2* 20.7* 19.3*  NEUTROABS 14.6* 18.5*  --  14.5*  --   --   HGB 12.2* 11.4* 10.6* 11.4* 11.1* 10.9*  HCT 37.2* 34.2* 32.8* 35.1* 34.8* 33.9*  MCV 89.2 89.1 90.1 90.5 91.3 91.1  PLT 232 240 266 343 349 0000000   Basic Metabolic Panel: Recent Labs  Lab 10/25/19 0718 10/26/19 0449 10/27/19 0413 10/28/19 0422 10/29/19 0328  NA 137 135 136 139 138  K 4.0 3.7 4.0 4.1 3.9  CL 100 98 99 98 100  CO2 25 24 24 27 28   GLUCOSE 121* 123* 121* 123* 126*  BUN 45* 42* 34* 27* 26*  CREATININE 1.75* 1.50* 1.31* 1.23 1.20  CALCIUM 8.3* 8.4* 8.5* 8.6* 8.6*  MG  --   --  2.2 2.2 1.9   GFR: Estimated Creatinine Clearance: 93.7 mL/min (by C-G formula based on SCr of 1.2 mg/dL). Liver Function  Tests: Recent Labs  Lab 10/24/19 1300  AST 34  ALT 26  ALKPHOS 70  BILITOT 1.2  PROT 7.6  ALBUMIN 3.0*   No results for input(s): LIPASE, AMYLASE in the last 168 hours. No results for input(s): AMMONIA in the last 168 hours. Coagulation Profile: Recent Labs  Lab 10/24/19 1300  INR 2.0*   Cardiac Enzymes: No results for input(s): CKTOTAL, CKMB, CKMBINDEX, TROPONINI in the last 168 hours. BNP (last 3 results) No results for input(s): PROBNP in the last 8760 hours. HbA1C: No results for input(s): HGBA1C in the last 72 hours. CBG: No results for input(s): GLUCAP in the last 168 hours. Lipid Profile: No results for input(s): CHOL, HDL, LDLCALC, TRIG, CHOLHDL, LDLDIRECT in the last 72 hours. Thyroid Function Tests: No results for input(s): TSH, T4TOTAL, FREET4, T3FREE, THYROIDAB in the last 72 hours. Anemia Panel: No results for input(s): VITAMINB12, FOLATE, FERRITIN, TIBC, IRON, RETICCTPCT in the last 72 hours. Sepsis Labs: Recent Labs  Lab 10/24/19 1314 10/24/19 1445  LATICACIDVEN 1.2 1.2    Recent Results (from the past 240 hour(s))  SARS CORONAVIRUS 2 (TAT 6-24 HRS) Nasopharyngeal Nasopharyngeal Swab     Status: None   Collection Time: 10/21/19  1:51 PM  Specimen: Nasopharyngeal Swab  Result Value Ref Range Status   SARS Coronavirus 2 NEGATIVE NEGATIVE Final    Comment: (NOTE) SARS-CoV-2 target nucleic acids are NOT DETECTED. The SARS-CoV-2 RNA is generally detectable in upper and lower respiratory specimens during the acute phase of infection. Negative results do not preclude SARS-CoV-2 infection, do not rule out co-infections with other pathogens, and should not be used as the sole basis for treatment or other patient management decisions. Negative results must be combined with clinical observations, patient history, and epidemiological information. The expected result is Negative. Fact Sheet for Patients: SugarRoll.be Fact  Sheet for Healthcare Providers: https://www.woods[employer].com/ This test is not yet approved or cleared by the [place] FDA and  has been authorized for detection and/or diagnosis of SARS-CoV-2 by FDA under an Emergency Use Authorization (EUA). This EUA will remain  in effect (meaning this test can be used) for the duration of the COVID-19 declaration under Section 56 4(b)(1) of the Act, 21 U.S.C. section 360bbb-3(b)(1), unless the authorization is terminated or revoked sooner. Performed at [facility], [place] [address] [place], [place] [address]   Blood Culture (routine x 2)     Status: None   Collection Time: 10/24/19  1:00 PM   Specimen: BLOOD LEFT HAND  Result Value Ref Range Status   Specimen Description BLOOD LEFT HAND  Final   Special Requests   Final    BOTTLES DRAWN AEROBIC AND ANAEROBIC Blood Culture adequate volume   Culture   Final    NO GROWTH 5 DAYS Performed at [facility], [place] [address] [place], [place] [address]    Report Status 10/29/2019 FINAL  Final  SARS CORONAVIRUS 2 (TAT 6-24 HRS) Nasopharyngeal Nasopharyngeal Swab     Status: None   Collection Time: 10/24/19  1:05 PM   Specimen: Nasopharyngeal Swab  Result Value Ref Range Status   SARS Coronavirus 2 NEGATIVE NEGATIVE Final    Comment: (NOTE) SARS-CoV-2 target nucleic acids are NOT DETECTED. The SARS-CoV-2 RNA is generally detectable in upper and lower respiratory specimens during the acute phase of infection. Negative results do not preclude SARS-CoV-2 infection, do not rule out co-infections with other pathogens, and should not be used as the sole basis for treatment or other patient management decisions. Negative results must be combined with clinical observations, patient history, and epidemiological information. The expected result is Negative. Fact Sheet for Patients: SugarRoll.be Fact Sheet for Healthcare  Providers: https://www.woods[employer].com/ This test is not yet approved or cleared by the [place] FDA and  has been authorized for detection and/or diagnosis of SARS-CoV-2 by FDA under an Emergency Use Authorization (EUA). This EUA will remain  in effect (meaning this test can be used) for the duration of the COVID-19 declaration under Section 56 4(b)(1) of the Act, 21 U.S.C. section 360bbb-3(b)(1), unless the authorization is terminated or revoked sooner. Performed at [facility], [place] [address] [place], [place] [address]   Blood Culture (routine x 2)     Status: None   Collection Time: 10/24/19  2:45 PM   Specimen: BLOOD RIGHT HAND  Result Value Ref Range Status   Specimen Description BLOOD RIGHT HAND  Final   Special Requests   Final    BOTTLES DRAWN AEROBIC AND ANAEROBIC Blood Culture adequate volume   Culture   Final    NO GROWTH 5 DAYS Performed at [facility], [place] [address], [place],  [address]    Report Status 10/29/2019 FINAL  Final  Urine culture     Status: None   Collection Time: 10/24/19  8:20 PM   Specimen: In/Out Cath Urine  Result Value Ref Range Status   Specimen Description IN/OUT CATH URINE  Final   Special Requests NONE  Final   Culture   Final    NO GROWTH Performed at [facility], [place] [address] [place], [place] [address]    Report Status 10/26/2019 FINAL  Final         Radiology Studies: CT EXTREMITY LOWER RIGHT WO CONTRAST  Result Date: 10/28/2019 CLINICAL DATA:  Severe right leg cellulitis EXAM: CT OF THE LOWER RIGHT EXTREMITY WITHOUT CONTRAST TECHNIQUE: Multidetector CT imaging of the right lower extremity was performed according to the standard protocol. COMPARISON:  None. FINDINGS: Bones/Joint/Cartilage No fracture or dislocation. There is diffuse osteopenia. Moderate right hip osteoarthritis is seen with superior joint space loss and marginal osteophyte formation. The patient is status post right  total knee arthroplasty. No periprosthetic lucency or fracture is identified. The sacroiliac joints appear to be intact. There is a small fragmented osteophyte seen at the superior right sacroiliac joint. Ligaments Suboptimally assessed by CT. Muscles and Tendons There is focal fatty atrophy seen within the mid rectus muscle with a tiny focus calcification, likely from prior focal tear. There is mild fatty atrophy noted throughout the remainder of the muscles. The visualized portions the tendons appear to be intact, however suboptimally visualized. Soft tissues There is diffuse subcutaneous edema and skin thickening seen surrounding the posterior medial lower leg there is a superficial cystic collection. Best seen on series 204, image 312 measuring 3 cm in length. IMPRESSION: 1. No acute osseous abnormality. No definite evidence of osteomyelitis. 2. Diffuse findings suggestive of cellulitis with superficial loculated collection over the posterior medial lower extremity which could represent phlegmon or early superficial abscess. 3. Focal fatty atrophy of the mid rectus femoris musculature, likely from a prior tear. Electronically Signed   By: Prudencio Pair M.D.   On: 10/28/2019 19:08   DVT  Result Date: 10/27/2019  Lower Venous Study Indications: Edema, and Swelling.  Limitations: Body habitus and poor ultrasound/tissue interface. Comparison Study: no prior Performing Technologist: Abram Sander RVS  Examination Guidelines: A complete evaluation includes B-mode imaging, spectral Doppler, color Doppler, and power Doppler as needed of all accessible portions of each vessel. Bilateral testing is considered an integral part of a complete examination. Limited examinations for reoccurring indications may be performed as noted.  +---------+---------------+---------+-----------+----------+-------------------+ RIGHT    CompressibilityPhasicitySpontaneityPropertiesThrombus Aging       +---------+---------------+---------+-----------+----------+-------------------+ CFV      Full                                                             +---------+---------------+---------+-----------+----------+-------------------+ SFJ      Full                                                             +---------+---------------+---------+-----------+----------+-------------------+ FV Prox  Full                                                             +---------+---------------+---------+-----------+----------+-------------------+  FV Mid   Full                                                             +---------+---------------+---------+-----------+----------+-------------------+ FV Distal               Yes      Yes                  unable to visualize                                                       well for                                                                  compression         +---------+---------------+---------+-----------+----------+-------------------+ PFV      Full                                                             +---------+---------------+---------+-----------+----------+-------------------+ POP      Full                                                             +---------+---------------+---------+-----------+----------+-------------------+ PTV      Full                                                             +---------+---------------+---------+-----------+----------+-------------------+ PERO                                                  Not visualized      +---------+---------------+---------+-----------+----------+-------------------+   +---------+---------------+---------+-----------+----------+--------------+ LEFT     CompressibilityPhasicitySpontaneityPropertiesThrombus Aging +---------+---------------+---------+-----------+----------+--------------+ CFV      Full            Yes      Yes                                 +---------+---------------+---------+-----------+----------+--------------+ SFJ      Full                                                        +---------+---------------+---------+-----------+----------+--------------+  FV Prox  Full                                                        +---------+---------------+---------+-----------+----------+--------------+ FV Mid   Full                                                        +---------+---------------+---------+-----------+----------+--------------+ FV DistalFull                                                        +---------+---------------+---------+-----------+----------+--------------+ PFV      Full                                                        +---------+---------------+---------+-----------+----------+--------------+ POP      Full           Yes      Yes                                 +---------+---------------+---------+-----------+----------+--------------+ PTV      Full                                                        +---------+---------------+---------+-----------+----------+--------------+ PERO                                                  Not visualized +---------+---------------+---------+-----------+----------+--------------+     Summary: Right: There is no evidence of deep vein thrombosis in the lower extremity. However, portions of this examination were limited- see technologist comments above. No cystic structure found in the popliteal fossa. Left: There is no evidence of deep vein thrombosis in the lower extremity. No cystic structure found in the popliteal fossa.  *See table(s) above for measurements and observations. Electronically signed by Curt Jews MD on 10/27/2019 at 4:02:21 PM.    Final         Scheduled Meds: . apixaban  5 mg Oral BID  . bumetanide  2 mg Oral BID  . carvedilol  25 mg Oral BID  WC  . linezolid  600 mg Oral Q12H   Continuous Infusions: . sodium chloride 250 mL (10/26/19 1204)     LOS: 4 days     Georgette Shell, MD [facility]  If 7PM-7AM, please contact night-coverage [email or web address] Password [facility] 10/29/2019, 11:48 AM

## 2019-10-29 NOTE — Consult Note (Addendum)
ORTHOPAEDIC CONSULTATION  REQUESTING PHYSICIAN: Georgette Shell, MD  Chief Complaint: Right leg cellulitis  HPI: Joel Dominguez is a 68 y.o. male who complains of severe right leg swelling, with redness, and drainage from the skin, this started about a week ago.  He has had a longstanding hammertoe, with ulceration at the tip, that his wife has been trying to care for, but they have not been able to get to heal.  He presented with fevers, up to 100.8, with leukocytosis at 17,000.  He has been on IV antibiotics, and reports that his leg has been feeling a little bit better, and has improved in the degree of redness.  He has had a right total knee replacement done in [place] over 10 years ago.  He had some type of neurologic complication, that happened sometime around the surgery, although he says it resolved, but then got worse 3 years later, ultimately resulting in a foot drop.  He uses a brace.  He has had known spinal stenosis as well.   Past Medical History:  Diagnosis Date  . A-fib- couple yrs now. 10/08/2016  . Aortic stenosis 11/06/2017  . Atherosclerotic heart disease of native coronary artery with unspecified angina pectoris ([place]) 10/08/2016  . CHF (congestive heart failure) ([place])   . Chronic anticoagulation 10/08/2016  . Foot drop- R 10/08/2016   Patient known foot drop for many years due to lumbar spinal stenosis.-Over the past couple of months symptoms have gotten worse.  He feels he is tripping more and cannot lift his foot up as much. Toes are curling and calluses on end of toes.    - Did see spinal surgeon many yrs ago.  Declined sx at that time.     . Generalized OA 10/08/2016   Ortho doc-  Dr. [facility] in [place]- both knees replaced 08, 09.     . H/O noncompliance with medical treatment, presenting hazards to health 10/22/2017  . Heart disease   . Hypertension   . Obesity, Class III, BMI 40-49.9 (morbid obesity) ([place]) 10/08/2016  . OSA (obstructive sleep apnea) 10/08/2016    Bi-PAP nitely- been 20+ yrs now  . Pulmonary hypertension ([place]) 10/08/2016  . Spinal stenosis, lumbar region, with neurogenic claudication 10/08/2016   Back specialist in [place]-    only surgeon he would use- pt declined referral here for back pain mgt    . Stroke [facility])    Past Surgical History:  Procedure Laterality Date  . REPLACEMENT TOTAL KNEE Bilateral   . TIBIA FRACTURE SURGERY     Social History   Socioeconomic History  . Marital status: Married    Spouse name: Not on file  . Number of children: Not on file  . Years of education: Not on file  . Highest education level: Not on file  Occupational History  . Not on file  Tobacco Use  . Smoking status: Never Smoker  . Smokeless tobacco: Never Used  Substance and Sexual Activity  . Alcohol use: Not Currently  . Drug use: No  . Sexual activity: Not on file  Other Topics Concern  . Not on file  Social History Narrative  . Not on file   Social Determinants of Health   Financial Resource Strain:   . Difficulty of Paying Living Expenses: Not on file  Food Insecurity:   . Worried About Charity fundraiser in the Last Year: Not on file  . Ran Out of Food in the Last Year: Not on file  Transportation Needs:   . Film/video editor (Medical): Not on file  . Lack of Transportation (Non-Medical): Not on file  Physical Activity:   . Days of Exercise per Week: Not on file  . Minutes of Exercise per Session: Not on file  Stress:   . Feeling of Stress : Not on file  Social Connections:   . Frequency of Communication with Friends and Family: Not on file  . Frequency of Social Gatherings with Friends and Family: Not on file  . Attends Religious Services: Not on file  . Active Member of Clubs or Organizations: Not on file  . Attends Archivist Meetings: Not on file  . Marital Status: Not on file   Family History  Problem Relation Age of Onset  . Congestive Heart Failure Mother   . Hypertension Mother   . Diabetes  Mother   . Cancer Father        lung  . Congestive Heart Failure Sister   . Hypertension Sister   . Hypertension Brother   . Hypertension Sister   . Diabetes Brother    Allergies  Allergen Reactions  . Cephalexin Rash     Positive ROS: All other systems have been reviewed and were otherwise negative with the exception of those mentioned in the HPI and as above.  Physical Exam: BP (!) 123/52 (BP Location: Left Arm)   Pulse 78   Temp 97.7 F (36.5 C)   Resp 17   Ht 6' (1.829 m)   Wt (!) 164.7 kg   SpO2 96%   BMI 49.23 kg/m   General: Alert, no acute distress Cardiovascular: He has substantial pedal edema in the right lower extremity, as well as pretibial edema, the left side has some swelling as well, but is probably at his baseline. Respiratory: No cyanosis, no use of accessory musculature GI: No organomegaly, abdomen is soft and non-tender Skin: See images below Neurologic: He has absent sensation on the dorsum of his right foot.  He does have extension of his great toe intact but cannot really control his ankle. Psychiatric: Patient is competent for consent with normal mood and affect Lymphatic: No axillary or cervical lymphadenopathy to the best that I could appreciate  MUSCULOSKELETAL: He has a well-healed surgical wound over the right knee.  The right posterior medial thigh has an area of some slight fluid collection, possible seroma formation, it is a little difficult to confirm clinically.  He is weak and has difficulty doing a straight leg raise.  He has an area over the right anterolateral ankle as well that has a fair amount of seroma formation, with bullae.          Assessment: Active Problems:   Cellulitis   Chronic congestive heart failure (HCC)   Longstanding persistent atrial fibrillation (HCC)  This appears to be a fairly aggressive necrotizing cellulitis involving almost the entirety of his right lower extremity, with a pre-existing total knee  replacement that is in place, and I do not think is involved.   Plan: Compared to the pictures that I saw on his wife's phone, his leg cellulitis actually is dramatically better than it was over the last couple of days, however certainly he is at high risk for worsening, and potential progression.  This can be both a life and a limb threatening situation.  All of this seems superficial, the one area I have concerned about is the right anterolateral ankle and the right posterior medial thigh.  This level  of complexity sometimes requires treatment in a burn unit, which is outside of the resources available in the [facility] health system, but for now I think he is okay to continue with the IV antibiotics given how much she is improved, and I will recheck him tomorrow.  If he is turning towards a worse direction then he will likely need surgical debridement, and possible transfer to [facility].  Will follow.  I have also discussed his case with Dr. Migdalia Dk (complex inpatient wound consult), who will see him on Monday if he is still in house.   Johnny Bridge, MD Cell [phone]   10/29/2019 3:05 PM

## 2019-10-30 ENCOUNTER — Inpatient Hospital Stay (HOSPITAL_COMMUNITY): Payer: Medicare Other | Admitting: Certified Registered"

## 2019-10-30 ENCOUNTER — Encounter (HOSPITAL_COMMUNITY)
Admission: EM | Disposition: A | Discharge status: Rehabilitation Facility | Payer: Self-pay | Source: Home / Self Care | Attending: Internal Medicine

## 2019-10-30 HISTORY — PX: I & D EXTREMITY: SHX5045

## 2019-10-30 LAB — SURGICAL PCR SCREEN
MRSA, PCR: NEGATIVE
Staphylococcus aureus: NEGATIVE

## 2019-10-30 LAB — CBC
HCT: 38.2 % — ABNORMAL LOW (ref 39.0–52.0)
Hemoglobin: 11.9 g/dL — ABNORMAL LOW (ref 13.0–17.0)
MCH: 28.4 pg (ref 26.0–34.0)
MCHC: 31.2 g/dL (ref 30.0–36.0)
MCV: 91.2 fL (ref 80.0–100.0)
Platelets: 428 10*3/uL — ABNORMAL HIGH (ref 150–400)
RBC: 4.19 MIL/uL — ABNORMAL LOW (ref 4.22–5.81)
RDW: 13.7 % (ref 11.5–15.5)
WBC: 13.4 10*3/uL — ABNORMAL HIGH (ref 4.0–10.5)
nRBC: 0 % (ref 0.0–0.2)

## 2019-10-30 LAB — BASIC METABOLIC PANEL
Anion gap: 9 (ref 5–15)
BUN: 20 mg/dL (ref 8–23)
CO2: 32 mmol/L (ref 22–32)
Calcium: 8.7 mg/dL — ABNORMAL LOW (ref 8.9–10.3)
Chloride: 99 mmol/L (ref 98–111)
Creatinine, Ser: 1.26 mg/dL — ABNORMAL HIGH (ref 0.61–1.24)
GFR calc Af Amer: >60 mL/min (ref >60)
GFR calc non Af Amer: 58 mL/min — ABNORMAL LOW (ref >60)
Glucose, Bld: 121 mg/dL — ABNORMAL HIGH (ref 70–99)
Potassium: 3.9 mmol/L (ref 3.5–5.1)
Sodium: 140 mmol/L (ref 135–145)

## 2019-10-30 LAB — GLUCOSE, CAPILLARY: Glucose-Capillary: 122 mg/dL — ABNORMAL HIGH (ref 70–99)

## 2019-10-30 LAB — MAGNESIUM: Magnesium: 2 mg/dL (ref 1.7–2.4)

## 2019-10-30 SURGERY — IRRIGATION AND DEBRIDEMENT EXTREMITY
Anesthesia: General | Laterality: Right

## 2019-10-30 MED ORDER — PHENYLEPHRINE HCL-NACL 10-0.9 MG/250ML-% IV SOLN
INTRAVENOUS | Status: DC | PRN
  Administered 2019-10-30: 25 ug/min via INTRAVENOUS

## 2019-10-30 MED ORDER — METOCLOPRAMIDE HCL 5 MG/ML IJ SOLN: 5.0000 mg | Freq: Three times a day (TID) | INTRAMUSCULAR | Status: DC | PRN

## 2019-10-30 MED ORDER — SUCCINYLCHOLINE CHLORIDE 200 MG/10ML IV SOSY
PREFILLED_SYRINGE | INTRAVENOUS | Status: DC | PRN
  Administered 2019-10-30: 200 mg via INTRAVENOUS

## 2019-10-30 MED ORDER — POTASSIUM CHLORIDE IN NACL 20-0.45 MEQ/L-% IV SOLN
INTRAVENOUS | Status: DC
  Filled 2019-10-30: qty 1000

## 2019-10-30 MED ORDER — ACETAMINOPHEN 500 MG PO TABS
1000.0000 mg | ORAL_TABLET | Freq: Four times a day (QID) | ORAL | Status: AC
  Administered 2019-10-30 – 2019-10-31 (×4): 1000 mg via ORAL
  Filled 2019-10-30 (×4): qty 2

## 2019-10-30 MED ORDER — OXYCODONE HCL 5 MG PO TABS
10.0000 mg | ORAL_TABLET | ORAL | Status: DC | PRN
  Administered 2019-10-30 – 2019-11-01 (×5): 15 mg via ORAL
  Administered 2019-11-02 – 2019-11-03 (×2): 10 mg via ORAL
  Administered 2019-11-05: 16:00:00 15 mg via ORAL
  Administered 2019-11-06 (×2): 10 mg via ORAL
  Administered 2019-11-07 – 2019-11-08 (×2): 15 mg via ORAL
  Administered 2019-11-10: 14:00:00 10 mg via ORAL
  Administered 2019-11-11: 13:00:00 15 mg via ORAL
  Filled 2019-10-30 (×2): qty 3
  Filled 2019-10-30: qty 2
  Filled 2019-10-30: qty 3
  Filled 2019-10-30: qty 2
  Filled 2019-10-30 (×3): qty 3
  Filled 2019-10-30: qty 2
  Filled 2019-10-30 (×3): qty 3

## 2019-10-30 MED ORDER — ONDANSETRON HCL 4 MG/2ML IJ SOLN: 4.0000 mg | Freq: Four times a day (QID) | INTRAMUSCULAR | Status: DC | PRN

## 2019-10-30 MED ORDER — METOCLOPRAMIDE HCL 5 MG PO TABS: 5.0000 mg | ORAL_TABLET | Freq: Three times a day (TID) | ORAL | Status: DC | PRN

## 2019-10-30 MED ORDER — PROPOFOL 10 MG/ML IV BOLUS
INTRAVENOUS | Status: AC
  Filled 2019-10-30: qty 20

## 2019-10-30 MED ORDER — FENTANYL CITRATE (PF) 250 MCG/5ML IJ SOLN
INTRAMUSCULAR | Status: AC
  Filled 2019-10-30: qty 5

## 2019-10-30 MED ORDER — LACTATED RINGERS IV SOLN: INTRAVENOUS | Status: DC

## 2019-10-30 MED ORDER — PHENYLEPHRINE HCL (PRESSORS) 10 MG/ML IV SOLN
INTRAVENOUS | Status: DC | PRN
  Administered 2019-10-30: 80 ug via INTRAVENOUS

## 2019-10-30 MED ORDER — ACETAMINOPHEN 325 MG PO TABS: 325.0000 mg | ORAL_TABLET | Freq: Four times a day (QID) | ORAL | Status: DC | PRN

## 2019-10-30 MED ORDER — OXYCODONE HCL 5 MG PO TABS
5.0000 mg | ORAL_TABLET | ORAL | Status: DC | PRN
  Administered 2019-10-31 – 2019-11-01 (×2): 10 mg via ORAL
  Administered 2019-11-01 (×2): 5 mg via ORAL
  Administered 2019-11-02: 10 mg via ORAL
  Administered 2019-11-04: 5 mg via ORAL
  Administered 2019-11-06: 04:00:00 10 mg via ORAL
  Administered 2019-11-09 (×2): 5 mg via ORAL
  Filled 2019-10-30 (×2): qty 2
  Filled 2019-10-30: qty 1
  Filled 2019-10-30: qty 2
  Filled 2019-10-30: qty 1
  Filled 2019-10-30: qty 2
  Filled 2019-10-30: qty 1
  Filled 2019-10-30: qty 2
  Filled 2019-10-30 (×3): qty 1
  Filled 2019-10-30: qty 2

## 2019-10-30 MED ORDER — DOCUSATE SODIUM 100 MG PO CAPS
100.0000 mg | ORAL_CAPSULE | Freq: Two times a day (BID) | ORAL | Status: DC
  Administered 2019-10-30 – 2019-11-11 (×25): 100 mg via ORAL
  Filled 2019-10-30 (×25): qty 1

## 2019-10-30 MED ORDER — DIPHENHYDRAMINE HCL 12.5 MG/5ML PO ELIX
12.5000 mg | ORAL_SOLUTION | ORAL | Status: DC | PRN
  Filled 2019-10-30: qty 10

## 2019-10-30 MED ORDER — 0.9 % SODIUM CHLORIDE (POUR BTL) OPTIME
TOPICAL | Status: DC | PRN
  Administered 2019-10-30: 1000 mL

## 2019-10-30 MED ORDER — MAGNESIUM CITRATE PO SOLN: 1.0000 | Freq: Once | ORAL | Status: DC | PRN

## 2019-10-30 MED ORDER — MIDAZOLAM HCL 2 MG/2ML IJ SOLN
INTRAMUSCULAR | Status: DC | PRN
  Administered 2019-10-30: 2 mg via INTRAVENOUS

## 2019-10-30 MED ORDER — HYDROMORPHONE HCL 1 MG/ML IJ SOLN: 0.5000 mg | INTRAMUSCULAR | Status: DC | PRN

## 2019-10-30 MED ORDER — ONDANSETRON HCL 4 MG PO TABS: 4.0000 mg | ORAL_TABLET | Freq: Four times a day (QID) | ORAL | Status: DC | PRN

## 2019-10-30 MED ORDER — MIDAZOLAM HCL 2 MG/2ML IJ SOLN
INTRAMUSCULAR | Status: AC
  Filled 2019-10-30: qty 2

## 2019-10-30 MED ORDER — SENNA 8.6 MG PO TABS
1.0000 | ORAL_TABLET | Freq: Two times a day (BID) | ORAL | Status: DC
  Administered 2019-10-30 – 2019-11-11 (×25): 8.6 mg via ORAL
  Filled 2019-10-30 (×25): qty 1

## 2019-10-30 MED ORDER — BISACODYL 10 MG RE SUPP: 10.0000 mg | Freq: Every day | RECTAL | Status: DC | PRN

## 2019-10-30 MED ORDER — DEXAMETHASONE SODIUM PHOSPHATE 10 MG/ML IJ SOLN
INTRAMUSCULAR | Status: DC | PRN
  Administered 2019-10-30: 10 mg via INTRAVENOUS

## 2019-10-30 MED ORDER — KETOROLAC TROMETHAMINE 15 MG/ML IJ SOLN
7.5000 mg | Freq: Four times a day (QID) | INTRAMUSCULAR | Status: AC
  Administered 2019-10-30 – 2019-10-31 (×4): 7.5 mg via INTRAVENOUS
  Filled 2019-10-30 (×4): qty 1

## 2019-10-30 MED ORDER — ONDANSETRON HCL 4 MG/2ML IJ SOLN
INTRAMUSCULAR | Status: DC | PRN
  Administered 2019-10-30: 4 mg via INTRAVENOUS

## 2019-10-30 MED ORDER — POLYETHYLENE GLYCOL 3350 17 G PO PACK: 17.0000 g | PACK | Freq: Every day | ORAL | Status: DC | PRN

## 2019-10-30 MED ORDER — SODIUM CHLORIDE 0.9 % IR SOLN
Status: DC | PRN
  Administered 2019-10-30 (×2): 3000 mL

## 2019-10-30 MED ORDER — LIDOCAINE 2% (20 MG/ML) 5 ML SYRINGE
INTRAMUSCULAR | Status: DC | PRN
  Administered 2019-10-30: 20 mg via INTRAVENOUS

## 2019-10-30 MED ORDER — ZOLPIDEM TARTRATE 5 MG PO TABS
5.0000 mg | ORAL_TABLET | Freq: Every evening | ORAL | Status: DC | PRN
  Filled 2019-10-30: qty 1

## 2019-10-30 MED ORDER — FENTANYL CITRATE (PF) 250 MCG/5ML IJ SOLN
INTRAMUSCULAR | Status: DC | PRN
  Administered 2019-10-30: 50 ug via INTRAVENOUS
  Administered 2019-10-30 (×2): 25 ug via INTRAVENOUS
  Administered 2019-10-30: 50 ug via INTRAVENOUS
  Administered 2019-10-30 (×2): 25 ug via INTRAVENOUS

## 2019-10-30 MED ORDER — MORPHINE SULFATE (PF) 2 MG/ML IV SOLN
1.0000 mg | INTRAVENOUS | Status: DC | PRN
  Administered 2019-10-30 – 2019-11-06 (×5): 1 mg via INTRAVENOUS
  Filled 2019-10-30 (×5): qty 1

## 2019-10-30 MED ORDER — PROPOFOL 10 MG/ML IV BOLUS
INTRAVENOUS | Status: DC | PRN
  Administered 2019-10-30: 100 mg via INTRAVENOUS

## 2019-10-30 SURGICAL SUPPLY — 42 items
BNDG COHESIVE 4X5 TAN STRL (GAUZE/BANDAGES/DRESSINGS) ×3 IMPLANT
BNDG ELASTIC 4X5.8 VLCR STR LF (GAUZE/BANDAGES/DRESSINGS) ×3 IMPLANT
BNDG ELASTIC 6X5.8 VLCR STR LF (GAUZE/BANDAGES/DRESSINGS) ×3 IMPLANT
BNDG GAUZE ELAST 4 BULKY (GAUZE/BANDAGES/DRESSINGS) ×3 IMPLANT
BOOTCOVER CLEANROOM LRG (PROTECTIVE WEAR) ×6 IMPLANT
COVER SURGICAL LIGHT HANDLE (MISCELLANEOUS) ×3 IMPLANT
COVER WAND RF STERILE (DRAPES) ×3 IMPLANT
CUFF TOURN SGL QUICK 34 (TOURNIQUET CUFF)
CUFF TRNQT CYL 34X4.125X (TOURNIQUET CUFF) IMPLANT
DURAPREP 26ML APPLICATOR (WOUND CARE) IMPLANT
ELECT REM PT RETURN 9FT ADLT (ELECTROSURGICAL) ×3
ELECTRODE REM PT RTRN 9FT ADLT (ELECTROSURGICAL) ×1 IMPLANT
EVACUATOR 1/8 PVC DRAIN (DRAIN) IMPLANT
GAUZE SPONGE 4X4 12PLY STRL (GAUZE/BANDAGES/DRESSINGS) ×3 IMPLANT
GAUZE XEROFORM 1X8 LF (GAUZE/BANDAGES/DRESSINGS) ×3 IMPLANT
GAUZE XEROFORM 5X9 LF (GAUZE/BANDAGES/DRESSINGS) ×9 IMPLANT
GLOVE BIOGEL PI ORTHO PRO SZ8 (GLOVE) ×2
GLOVE ORTHO TXT STRL SZ7.5 (GLOVE) ×3 IMPLANT
GLOVE PI ORTHO PRO STRL SZ8 (GLOVE) ×1 IMPLANT
GLOVE SURG ORTHO 8.0 STRL STRW (GLOVE) ×6 IMPLANT
GOWN STRL REUS W/ TWL LRG LVL3 (GOWN DISPOSABLE) IMPLANT
GOWN STRL REUS W/TWL LRG LVL3 (GOWN DISPOSABLE)
HANDPIECE INTERPULSE COAX TIP (DISPOSABLE)
KIT BASIN OR (CUSTOM PROCEDURE TRAY) ×3 IMPLANT
KIT TURNOVER KIT B (KITS) ×3 IMPLANT
MANIFOLD NEPTUNE II (INSTRUMENTS) ×3 IMPLANT
NS IRRIG 1000ML POUR BTL (IV SOLUTION) ×3 IMPLANT
PACK ORTHO EXTREMITY (CUSTOM PROCEDURE TRAY) ×3 IMPLANT
PAD ARMBOARD 7.5X6 YLW CONV (MISCELLANEOUS) ×6 IMPLANT
SET CYSTO W/LG BORE CLAMP LF (SET/KITS/TRAYS/PACK) ×3 IMPLANT
SET HNDPC FAN SPRY TIP SCT (DISPOSABLE) IMPLANT
SPONGE LAP 18X18 RF (DISPOSABLE) ×3 IMPLANT
STOCKINETTE IMPERVIOUS 9X36 MD (GAUZE/BANDAGES/DRESSINGS) ×3 IMPLANT
SUT ETHILON 3 0 PS 1 (SUTURE) IMPLANT
SWAB CULTURE ESWAB REG 1ML (MISCELLANEOUS) ×6 IMPLANT
TOWEL GREEN STERILE (TOWEL DISPOSABLE) ×3 IMPLANT
TOWEL GREEN STERILE FF (TOWEL DISPOSABLE) ×3 IMPLANT
TUBE CONNECTING 12'X1/4 (SUCTIONS) ×1
TUBE CONNECTING 12X1/4 (SUCTIONS) ×2 IMPLANT
UNDERPAD 30X30 (UNDERPADS AND DIAPERS) ×3 IMPLANT
WATER STERILE IRR 1000ML POUR (IV SOLUTION) ×3 IMPLANT
YANKAUER SUCT BULB TIP NO VENT (SUCTIONS) ×3 IMPLANT

## 2019-10-30 NOTE — Anesthesia Preprocedure Evaluation (Addendum)
Anesthesia Evaluation  Patient identified by MRN, date of birth, ID band Patient awake    Reviewed: Allergy & Precautions, NPO status , Patient's Chart, lab work & pertinent test results  History of Anesthesia Complications Negative for: history of anesthetic complications  Airway Mallampati: I  TM Distance: >3 FB Neck ROM: Full    Dental  (+) Poor Dentition, Missing, Chipped, Dental Advisory Given   Pulmonary sleep apnea and Continuous Positive Airway Pressure Ventilation ,  10/24/2019 SARS coronavirus NEG   breath sounds clear to auscultation       Cardiovascular hypertension, Pt. on medications and Pt. on home beta blockers (-) angina+ CAD and +CHF (pulm HTN)  + dysrhythmias Atrial Fibrillation  Rhythm:Irregular Rate:Normal  06/2019 ECHO: left ventricle has low normal systolic function, with an ejection fraction of 50-55%. The cavity size was normal. There is moderate concentric left ventricular hypertrophy, mod MR, mild AS with mild AI   Neuro/Psych Chronic back pain CVA, No Residual Symptoms    GI/Hepatic negative GI ROS, Neg liver ROS,   Endo/Other  Morbid obesity  Renal/GU Renal InsufficiencyRenal disease (creat 1.26)     Musculoskeletal  (+) Arthritis ,   Abdominal (+) + obese,   Peds  Hematology eliquis INR 2.0   Anesthesia Other Findings   Reproductive/Obstetrics                            Anesthesia Physical Anesthesia Plan  ASA: III  Anesthesia Plan: General   Post-op Pain Management:    Induction: Intravenous  PONV Risk Score and Plan: 2 and Ondansetron and Dexamethasone  Airway Management Planned: Oral ETT  Additional Equipment: None  Intra-op Plan:   Post-operative Plan: Extubation in OR  Informed Consent: I have reviewed the patients History and Physical, chart, labs and discussed the procedure including the risks, benefits and alternatives for the proposed  anesthesia with the patient or authorized representative who has indicated his/her understanding and acceptance.     Dental advisory given  Plan Discussed with: CRNA and Surgeon  Anesthesia Plan Comments:        Anesthesia Quick Evaluation

## 2019-10-30 NOTE — Progress Notes (Signed)
Pt able to place himself on his home CPAP without assistance. Pt respiratory status is stable at this time. RT will continue to monitor.

## 2019-10-30 NOTE — Progress Notes (Addendum)
Patient seen and examined, he has somewhat worsening of the right distal thigh fluid collection and I am concerned it is developing into an abscess.  I recommended surgical irrigation and debridement, we will also clean the skin on his right lower extremity and determine if there is any other excisional or incisional debridement necessary in the right lower extremity.  If he progressively worsens he may need transfer to [facility] for necrotizing cellulitis, I am not open his thigh wound, and then probably place a wound VAC and we will monitor closely his clinical status.  This procedure has been fully reviewed with the patient and written informed consent has been obtained.   Johnny Bridge, MD

## 2019-10-30 NOTE — Progress Notes (Signed)
Pt has his home CPAP with him and is able to place himself on without assistance. Pt respiratory status is stable at this time. RT will continue to monitor.

## 2019-10-30 NOTE — Anesthesia Procedure Notes (Signed)
Procedure Name: Intubation Date/Time: 10/30/2019 1:14 PM Performed by: Clearnce Sorrel, CRNA Pre-anesthesia Checklist: Patient identified, Emergency Drugs available, Suction available, Patient being monitored and Timeout performed Patient Re-evaluated:Patient Re-evaluated prior to induction Oxygen Delivery Method: Circle system utilized Preoxygenation: Pre-oxygenation with 100% oxygen Induction Type: IV induction and Rapid sequence Laryngoscope Size: Mac and 4 Grade View: Grade I Tube type: Oral Tube size: 7.5 mm Number of attempts: 1 Airway Equipment and Method: Stylet Placement Confirmation: ETT inserted through vocal cords under direct vision,  positive ETCO2 and breath sounds checked- equal and bilateral Secured at: 23 cm Tube secured with: Tape Dental Injury: Teeth and Oropharynx as per pre-operative assessment

## 2019-10-30 NOTE — Progress Notes (Signed)
PROGRESS NOTE    Joel Dominguez  [id number] DOB: [birth year] DOA: 10/24/2019 PCP: Mellody Dance, DO   Brief Narrative: 68 year old male with history of A. fib on Eliquis, AAS, diastolic CHF, moderate MR, morbid obesity and OSA presented with right leg pain and swelling with blisters. Noted to be febrile to 100.8 with leukocytosis to 17,000. Started vancomycin and aztreonam out of concern for cephalosporin allergy. He was admitted on IV vancomycin.   Patient denies penicillin allergy or cephalosporin allergies. Blood and urine cultures negative. Antibiotic deescalated to IV ampicillin the next day.   Assessment & Plan:   Principal Problem:   Necrotizing celluliitis of lower leg ([place]), right Active Problems:   Chronic congestive heart failure (HCC)   Longstanding persistent atrial fibrillation (HCC)  Severe right leg cellulitis:Cellulitis was spreading to the right upper thigh which has improved after starting him on Zyvox.Still with leukocytosis. -IV vancomycin 12/20-12/22. Aztreonam 12/20.  -IV ampicillin 12/22>>12/24 -Zyvox 12/24 > -Appreciate wound care/appreciate Dr. Luanna Cole input. -leukocytosis improving 13 down from 19.  He is status post right leg excisional debridement.  10/30/2019   Continue Demadex twice a day instead of once a day to decrease swelling.  Monitor his renal functions on diuretics. He was on twice a day at home. He seems to be tolerating morphine for pain control has had no hallucination or nightmares with the morphine. Lower extremity Doppler showed no evidence of DVT. Patient seen by therapy and recommended CIR. CIR will reevaluate on Monday for appropriateness. Discussed with ID. CT of the right lower extremity shows no evidence of deep abscess.  Superficial fluid collection over the posterior medial lower extremity which could represent a phlegmon or early superficial abscess.  AKI on CKD-3a/azotemia: Improving. Could be combination of  prerenal and ATN. Cr 1.2-1.4 (baseline)> 2.04 (10/21/2019)> 1.88 (admit)>> 1.55>1.31.Creatinine 1.20 today. Improving.  Chronic diastolic CHF/pulmonary HTN: appears euvolemic except for significant edema in RLE from cellulitis.About 1.4 L UOP. Increase Bumex to 2 mg twice a day to decrease swelling in the right lower extremity. Negative by 3.5 L.  Paroxysmal A. Fib: Rate controlled on Coreg. On Eliquis for anticoagulation. -Continue home medications  History of CVA without residual deficit: Stable -Continue home medications.  Essential hypertension-144/65.watch closely on Coreg and Bumex.-Continue Coreg and Bumex. -Continue to  hold  Cardizem and Aldactone.  Anemia of chronic disease: H&H relatively stable.Hemoglobin 11.9 .  History of OSA -Nightly CPAP  Morbid obesity: BMI 48. -Encourage lifestyle change to lose weight.  Pressure Injury 10/28/19 Buttocks Right;Left;Mid Deep Tissue Pressure Injury - Purple or maroon localized area of discolored intact skin or blood-filled blister due to damage of underlying soft tissue from pressure and/or shear.  (Active)  10/28/19 0800  Location: Buttocks  Location Orientation: Right;Left;Mid  Staging: Deep Tissue Pressure Injury - Purple or maroon localized area of discolored intact skin or blood-filled blister due to damage of underlying soft tissue from pressure and/or shear.  Wound Description (Comments):   Present on Admission:     Estimated body mass index is 48.28 kg/m as calculated from the following:   Height as of this encounter: 6' (1.829 m).   Weight as of this encounter: 161.5 kg.   Subjective:  Patient resting in bed  No new complaints   Objective: Vitals:   10/30/19 0931 10/30/19 1152 10/30/19 1415 10/30/19 1430  BP: (!) 147/66 (!) 141/79 (!) 86/75 (!) 144/65  Pulse: 89 83 93 82  Resp: 20 17 20 20   Temp:  97.6 F (36.4 C)  97.8 F (36.6 C)   TempSrc:      SpO2: 93% 93% 100% 93%  Weight:        Height:        Intake/Output Summary (Last 24 hours) at 10/30/2019 1502 Last data filed at 10/30/2019 1408 Gross per 24 hour  Intake 1000 ml  Output 2665 ml  Net -1665 ml   Filed Weights   10/28/19 0400 10/29/19 0505 10/30/19 0300  Weight: (!) 167.4 kg (!) 164.7 kg (!) 161.5 kg    Examination:  General exam: Appears calm and comfortable  Respiratory system: Clear to auscultation. Respiratory effort normal. Cardiovascular system: S1 & S2 heard, RRR. No JVD, murmurs, rubs, gallops or clicks. No pedal edema. Gastrointestinal system: Abdomen is nondistended, soft and nontender. No organomegaly or masses felt. Normal bowel sounds heard. Central nervous system: Alert and oriented. No focal neurological deficits. Extremities: right foot decreased edema erythema  Skin: No rashes, lesions or ulcers Psychiatry: Judgement and insight appear normal. Mood & affect appropriate.     Data Reviewed: I have personally reviewed following labs and imaging studies  CBC: Recent Labs  Lab 10/24/19 1300 10/25/19 0543 10/26/19 0449 10/27/19 0413 10/28/19 0422 10/29/19 0328 10/30/19 0708  WBC 17.1* 22.1* 19.0* 18.2* 20.7* 19.3* 13.4*  NEUTROABS 14.6* 18.5*  --  14.5*  --   --   --   HGB 12.2* 11.4* 10.6* 11.4* 11.1* 10.9* 11.9*  HCT 37.2* 34.2* 32.8* 35.1* 34.8* 33.9* 38.2*  MCV 89.2 89.1 90.1 90.5 91.3 91.1 91.2  PLT 232 240 266 343 349 385 [id number]*   Basic Metabolic Panel: Recent Labs  Lab 10/26/19 0449 10/27/19 0413 10/28/19 0422 10/29/19 0328 10/30/19 0708  NA 135 136 139 138 140  K 3.7 4.0 4.1 3.9 3.9  CL 98 99 98 100 99  CO2 24 24 27 28  32  GLUCOSE 123* 121* 123* 126* 121*  BUN 42* 34* 27* 26* 20  CREATININE 1.50* 1.31* 1.23 1.20 1.26*  CALCIUM 8.4* 8.5* 8.6* 8.6* 8.7*  MG  --  2.2 2.2 1.9 2.0   GFR: Estimated Creatinine Clearance: 88.3 mL/min (A) (by C-G formula based on SCr of 1.26 mg/dL (H)). Liver Function Tests: Recent Labs  Lab 10/24/19 1300  AST 34  ALT 26   ALKPHOS 70  BILITOT 1.2  PROT 7.6  ALBUMIN 3.0*   No results for input(s): LIPASE, AMYLASE in the last 168 hours. No results for input(s): AMMONIA in the last 168 hours. Coagulation Profile: Recent Labs  Lab 10/24/19 1300  INR 2.0*   Cardiac Enzymes: No results for input(s): CKTOTAL, CKMB, CKMBINDEX, TROPONINI in the last 168 hours. BNP (last 3 results) No results for input(s): PROBNP in the last 8760 hours. HbA1C: No results for input(s): HGBA1C in the last 72 hours. CBG: Recent Labs  Lab 10/30/19 1417  GLUCAP 122*   Lipid Profile: No results for input(s): CHOL, HDL, LDLCALC, TRIG, CHOLHDL, LDLDIRECT in the last 72 hours. Thyroid Function Tests: No results for input(s): TSH, T4TOTAL, FREET4, T3FREE, THYROIDAB in the last 72 hours. Anemia Panel: No results for input(s): VITAMINB12, FOLATE, FERRITIN, TIBC, IRON, RETICCTPCT in the last 72 hours. Sepsis Labs: Recent Labs  Lab 10/24/19 1314 10/24/19 1445  LATICACIDVEN 1.2 1.2    Recent Results (from the past 240 hour(s))  SARS CORONAVIRUS 2 (TAT 6-24 HRS) Nasopharyngeal Nasopharyngeal Swab     Status: None   Collection Time: 10/21/19  1:51 PM   Specimen: Nasopharyngeal Swab  Result Value Ref Range Status  SARS Coronavirus 2 NEGATIVE NEGATIVE Final    Comment: (NOTE) SARS-CoV-2 target nucleic acids are NOT DETECTED. The SARS-CoV-2 RNA is generally detectable in upper and lower respiratory specimens during the acute phase of infection. Negative results do not preclude SARS-CoV-2 infection, do not rule out co-infections with other pathogens, and should not be used as the sole basis for treatment or other patient management decisions. Negative results must be combined with clinical observations, patient history, and epidemiological information. The expected result is Negative. Fact Sheet for Patients: SugarRoll.be Fact Sheet for Healthcare  Providers: https://www.woods-Radley Barto.com/ This test is not yet approved or cleared by the [place] FDA and  has been authorized for detection and/or diagnosis of SARS-CoV-2 by FDA under an Emergency Use Authorization (EUA). This EUA will remain  in effect (meaning this test can be used) for the duration of the COVID-19 declaration under Section 56 4(b)(1) of the Act, 21 U.S.C. section 360bbb-3(b)(1), unless the authorization is terminated or revoked sooner. Performed at [facility], [place] [address] [place], [place] [address]   Blood Culture (routine x 2)     Status: None   Collection Time: 10/24/19  1:00 PM   Specimen: BLOOD LEFT HAND  Result Value Ref Range Status   Specimen Description BLOOD LEFT HAND  Final   Special Requests   Final    BOTTLES DRAWN AEROBIC AND ANAEROBIC Blood Culture adequate volume   Culture   Final    NO GROWTH 5 DAYS Performed at [facility], [place] [address], [place], [place] [address]    Report Status 10/29/2019 FINAL  Final  SARS CORONAVIRUS 2 (TAT 6-24 HRS) Nasopharyngeal Nasopharyngeal Swab     Status: None   Collection Time: 10/24/19  1:05 PM   Specimen: Nasopharyngeal Swab  Result Value Ref Range Status   SARS Coronavirus 2 NEGATIVE NEGATIVE Final    Comment: (NOTE) SARS-CoV-2 target nucleic acids are NOT DETECTED. The SARS-CoV-2 RNA is generally detectable in upper and lower respiratory specimens during the acute phase of infection. Negative results do not preclude SARS-CoV-2 infection, do not rule out co-infections with other pathogens, and should not be used as the sole basis for treatment or other patient management decisions. Negative results must be combined with clinical observations, patient history, and epidemiological information. The expected result is Negative. Fact Sheet for Patients: SugarRoll.be Fact Sheet for Healthcare  Providers: https://www.woods-Keshayla Schrum.com/ This test is not yet approved or cleared by the [place] FDA and  has been authorized for detection and/or diagnosis of SARS-CoV-2 by FDA under an Emergency Use Authorization (EUA). This EUA will remain  in effect (meaning this test can be used) for the duration of the COVID-19 declaration under Section 56 4(b)(1) of the Act, 21 U.S.C. section 360bbb-3(b)(1), unless the authorization is terminated or revoked sooner. Performed at [facility], [place] [address], [place], [place] [address]   Blood Culture (routine x 2)     Status: None   Collection Time: 10/24/19  2:45 PM   Specimen: BLOOD RIGHT HAND  Result Value Ref Range Status   Specimen Description BLOOD RIGHT HAND  Final   Special Requests   Final    BOTTLES DRAWN AEROBIC AND ANAEROBIC Blood Culture adequate volume   Culture   Final    NO GROWTH 5 DAYS Performed at [facility], [place] [address] [place], [place] [address]    Report Status 10/29/2019 FINAL  Final  Urine culture     Status: None  Collection Time: 10/24/19  8:20 PM   Specimen: In/Out Cath Urine  Result Value Ref Range Status   Specimen Description IN/OUT CATH URINE  Final   Special Requests NONE  Final   Culture   Final    NO GROWTH Performed at [facility], [place] [address], [place], [place] [address]    Report Status 10/26/2019 FINAL  Final  Surgical pcr screen     Status: None   Collection Time: 10/30/19 11:55 AM   Specimen: Nasal Mucosa; Nasal Swab  Result Value Ref Range Status   MRSA, PCR NEGATIVE NEGATIVE Final   Staphylococcus aureus NEGATIVE NEGATIVE Final    Comment: (NOTE) The Xpert SA Assay (FDA approved for NASAL specimens in patients 66 years of age and older), is one component of a comprehensive surveillance program. It is not intended to diagnose infection nor to guide or monitor treatment. Performed at [facility], [place] [address], [place],  [place] [address]          Radiology Studies: CT EXTREMITY LOWER RIGHT WO CONTRAST  Result Date: 10/28/2019 CLINICAL DATA:  Severe right leg cellulitis EXAM: CT OF THE LOWER RIGHT EXTREMITY WITHOUT CONTRAST TECHNIQUE: Multidetector CT imaging of the right lower extremity was performed according to the standard protocol. COMPARISON:  None. FINDINGS: Bones/Joint/Cartilage No fracture or dislocation. There is diffuse osteopenia. Moderate right hip osteoarthritis is seen with superior joint space loss and marginal osteophyte formation. The patient is status post right total knee arthroplasty. No periprosthetic lucency or fracture is identified. The sacroiliac joints appear to be intact. There is a small fragmented osteophyte seen at the superior right sacroiliac joint. Ligaments Suboptimally assessed by CT. Muscles and Tendons There is focal fatty atrophy seen within the mid rectus muscle with a tiny focus calcification, likely from prior focal tear. There is mild fatty atrophy noted throughout the remainder of the muscles. The visualized portions the tendons appear to be intact, however suboptimally visualized. Soft tissues There is diffuse subcutaneous edema and skin thickening seen surrounding the posterior medial lower leg there is a superficial cystic collection. Best seen on series 204, image 312 measuring 3 cm in length. IMPRESSION: 1. No acute osseous abnormality. No definite evidence of osteomyelitis. 2. Diffuse findings suggestive of cellulitis with superficial loculated collection over the posterior medial lower extremity which could represent phlegmon or early superficial abscess. 3. Focal fatty atrophy of the mid rectus femoris musculature, likely from a prior tear. Electronically Signed   By: Prudencio Pair M.D.   On: 10/28/2019 19:08        Scheduled Meds: . apixaban  5 mg Oral BID  . bumetanide  2 mg Oral BID  . carvedilol  25 mg Oral BID WC  . linezolid  600 mg Oral Q12H   Continuous  Infusions: . sodium chloride 250 mL (10/26/19 1204)  . lactated ringers       LOS: 5 days    Georgette Shell, MD [facility]  If 7PM-7AM, please contact night-coverage [email or web address] Password [email or web address] 10/30/2019, 3:02 PM

## 2019-10-30 NOTE — Transfer of Care (Signed)
Immediate Anesthesia Transfer of Care Note  Patient: Joel Dominguez  Procedure(s) Performed: IRRIGATION AND DEBRIDEMENT EXTREMITY, right leg possible wound vac placement (Right )  Patient Location: PACU  Anesthesia Type:General  Level of Consciousness: awake, alert  and oriented  Airway & Oxygen Therapy: Patient Spontanous Breathing and Patient connected to face mask oxygen  Post-op Assessment: Report given to RN and Post -op Vital signs reviewed and stable  Post vital signs: Reviewed and stable  Last Vitals:  Vitals Value Taken Time  BP 86/75 10/30/19 1416  Temp    Pulse 95 10/30/19 1418  Resp 27 10/30/19 1418  SpO2 99 % 10/30/19 1418  Vitals shown include unvalidated device data.  Last Pain:  Vitals:   10/30/19 1415  TempSrc:   PainSc: (P) Asleep      Patients Stated Pain Goal: 2 ([id number])  Complications: No apparent anesthesia complications

## 2019-10-30 NOTE — Op Note (Signed)
10/30/2019  2:03 PM  PATIENT:  Joel Dominguez    PRE-OPERATIVE DIAGNOSIS:  necrotizing cellulitis, right leg  POST-OPERATIVE DIAGNOSIS:  Same  PROCEDURE:    1.  Right leg excisional debridement of distal medial leg, 9 cm x 11 cm, skin and subcutaneous tissue 2.  Right leg excisional debridement of distal lateral ankle, 4 x 4 cm, skin and subcutaneous tissue 3.  Right distal medial thigh excisional debridement of skin subcutaneous tissue, 12 x 9 cm, with incision into the subcutaneous tissue and exploration, which did not yield any significant subcutaneous purulence.  SURGEON:  Johnny Bridge, MD  PHYSICIAN ASSISTANT: Merlene Pulling, PA-C, present and scrubbed throughout the case, critical for completion in a timely fashion, and for retraction, instrumentation, and closure.  ANESTHESIA:   General  PREOPERATIVE INDICATIONS:  Joel Dominguez is a  68 y.o. male with a diagnosis of necrotizing cellulitis who has had increasing swelling around his right thigh, as well as drainage around the leg.  The risks benefits and alternatives were discussed with the patient preoperatively including but not limited to the risks of infection, bleeding, nerve injury, cardiopulmonary complications, the need for revision surgery, among others, and the patient was willing to proceed.  We also discussed the potential that he may need further surgical intervention, even skin grafting, and even potentially transfer to a burn unit.  ESTIMATED BLOOD LOSS: 25 mL  OPERATIVE IMPLANTS: None  OPERATIVE FINDINGS: All of his infection seem to be mostly desquamating the skin and epithelial tissue, it basically went down to the level of the dermis, but not below.  The area that I was concerned was developing some deep fluctuance over the distal medial side I attempted an aspiration with an 18-gauge needle, did not get any fluid, I did open the subcutaneous tissue there to explore it and did not find any abscess.  There was  however significant bulla formation over both the distal thigh, as well as the distal medial leg and distal lateral leg.  OPERATIVE PROCEDURE: The patient was brought to the operating room and placed in supine position.  General anesthesia was administered.  The right lower extremity was prepped and draped in usual sterile fashion.  Timeout performed.  I started distally, and I used a gauze as well as a skin scissors and a pickup, and debrided epithelial skin, as well as remove the bulla, and sent this for Gram stain culture and sensitivity.  I did the same thing over the lateral aspect of the leg.  The skin was discriminating almost the entirety of the lower extremity, which I peeled off the superficial layers.  This was beneficial, and left the leg looking with a much better appearance, and removal of all of the dead skin.  I went to the medial aspect of the thigh, excised the skin and dead tissue with a scissors and a gauze, I then used a scalpel to go below the level of the dermis, and explored the subcutaneous tissue but did not find abscess.  I also used an 18-gauge needle to try and see if I could confirm fluid.  Having been satisfied that the infection did in fact remain within the level of the dermis and epidermis, and was basically tracking along the skin, I did not feel that further deeper resection was indicated.  I irrigated with a total of 6 L of fluid across the entirety of the leg and then dressed the wounds with Xeroform and sterile gauze.  A light compressive wrap  was applied.  He was awakened and returned to the PACU in stable and satisfactory condition.  There were no complications and he tolerated the procedure well.

## 2019-10-31 LAB — BASIC METABOLIC PANEL
Anion gap: 12 (ref 5–15)
BUN: 27 mg/dL — ABNORMAL HIGH (ref 8–23)
CO2: 27 mmol/L (ref 22–32)
Calcium: 8.3 mg/dL — ABNORMAL LOW (ref 8.9–10.3)
Chloride: 97 mmol/L — ABNORMAL LOW (ref 98–111)
Creatinine, Ser: 1.35 mg/dL — ABNORMAL HIGH (ref 0.61–1.24)
GFR calc Af Amer: >60 mL/min (ref >60)
GFR calc non Af Amer: 54 mL/min — ABNORMAL LOW (ref >60)
Glucose, Bld: 151 mg/dL — ABNORMAL HIGH (ref 70–99)
Potassium: 4.6 mmol/L (ref 3.5–5.1)
Sodium: 136 mmol/L (ref 135–145)

## 2019-10-31 LAB — CBC
HCT: 32 % — ABNORMAL LOW (ref 39.0–52.0)
Hemoglobin: 10.2 g/dL — ABNORMAL LOW (ref 13.0–17.0)
MCH: 29.1 pg (ref 26.0–34.0)
MCHC: 31.9 g/dL (ref 30.0–36.0)
MCV: 91.2 fL (ref 80.0–100.0)
Platelets: 399 10*3/uL (ref 150–400)
RBC: 3.51 MIL/uL — ABNORMAL LOW (ref 4.22–5.81)
RDW: 13.5 % (ref 11.5–15.5)
WBC: 15.5 10*3/uL — ABNORMAL HIGH (ref 4.0–10.5)
nRBC: 0 % (ref 0.0–0.2)

## 2019-10-31 LAB — MAGNESIUM: Magnesium: 2.2 mg/dL (ref 1.7–2.4)

## 2019-10-31 MED ORDER — APIXABAN 5 MG PO TABS: 5.0000 mg | ORAL_TABLET | Freq: Two times a day (BID) | ORAL | Status: DC

## 2019-10-31 NOTE — Progress Notes (Signed)
Patient bleeding through dressing from procedure. Dressing reinforced per order and ice applied. VS stable. Called [facility]. Stated to change the dressing per previous order.

## 2019-10-31 NOTE — Progress Notes (Signed)
PROGRESS NOTE    Joel Dominguez  [id number] DOB: 1951/04/19 DOA: 10/24/2019 PCP: Mellody Dance, DO   Brief Narrative: Patient is a 68 year old male with history of A. fib on Eliquis, AAS, diastolic CHF, moderate MR, morbid obesity and OSA.  Patient presented with right leg necrotizing cellulitis, with associated pain, swelling and blisters.  On presentation, patient was febrile with significant leukocytosis(17,000).  Patient underwent excisional debridement of the skin and subcutaneous tissue of the right lower extremity by the orthopedic team on 10/30/2019.  Patient is currently on intravenous Zyvox.  Wound culture result is pending.  Blood culture has not grown any organisms.  Nasal swab is negative for MRSA.  10/31/2019: Postop, patient feels a lot better.  However, significant wheezing (bloody) was noted.  Apixaban has been held by the orthopedic team.  Input from the orthopedic surgery team is highly appreciated.  Patient was seen alongside patient's wife.    Assessment & Plan:   Principal Problem:   Necrotizing celluliitis of lower leg ([place]), right Active Problems:   Chronic congestive heart failure (HCC)   Longstanding persistent atrial fibrillation (HCC)  Severe right leg cellulitis:Cellulitis was spreading to the right upper thigh which has improved after starting him on Zyvox.Still with leukocytosis. -IV vancomycin 12/20-12/22. Aztreonam 12/20.  -IV ampicillin 12/22>>12/24 -Zyvox 12/24 > -Appreciate wound care/appreciate Dr. Luanna Cole input. -leukocytosis improving 13 down from 19. -Patient underwent excisional debridement of the skin and subcutaneous tissue on 10/30/2019. -Lower extremity Doppler showed no evidence of DVT. -Patient seen by therapy and recommended CIR. -CIR will reevaluate on Monday for appropriateness. -CT of the right lower extremity shows no evidence of deep abscess.  Superficial fluid collection over the posterior medial lower extremity which  could represent a phlegmon or early superficial abscess. 10/31/2019: Kindly see above documentation.  WBC today is 15.5.  Patient remains on IV Zyvox.  Significant blood oozing was noted postop.  Apixaban is currently on hold.  Orthopedic surgery team is assisting in directing patient's care.   AKI on CKD-3a: -Acute kidney injury has resolved. -Renal function is back to baseline -Cr 1.2-1.4 (baseline)> 2.04 (10/21/2019)> 1.88 (admit)>> 1.55>1.31. Serum creatinine today is 1.35.  Chronic diastolic CHF/pulmonary HTN:  -Stable.   -Continue current medication (Bumex and Coreg)   Paroxysmal A. Fib:  Rate controlled on Coreg.  Eliquis is currently on hold by the orthopedic surgery team.   Resume Eliquis once okay with the orthopedic surgery team.    History of CVA without residual deficit: Stable -Continue home medications.  Essential hypertension -Blood pressure is optimized.  Last documented blood pressure was 131/76 mmHg.   -Goal blood pressure at least less than 130/80 mmHg  -Avoid hypotension in setting of AKI.  Keep MAP greater than 65 mmHg.  Anemia: -Current drop in H/H is likely due to recent surgery and bloody bruising.  -Hemoglobin today is 10.2 g/dL.  -Continue to monitor hemoglobin closely.    History of OSA -Nightly CPAP  Morbid obesity: BMI 48. -Encourage lifestyle change to lose weight. -Further management on out patient basis  Pressure Injury 10/28/19 Buttocks Right;Left;Mid Deep Tissue Pressure Injury - Purple or maroon localized area of discolored intact skin or blood-filled blister due to damage of underlying soft tissue from pressure and/or shear.  (Active)  10/28/19 0800  Location: Buttocks  Location Orientation: Right;Left;Mid  Staging: Deep Tissue Pressure Injury - Purple or maroon localized area of discolored intact skin or blood-filled blister due to damage of underlying soft tissue from pressure and/or shear.  Wound Description (Comments):     Present on Admission:     Estimated body mass index is 47.88 kg/m as calculated from the following:   Height as of this encounter: 6' (1.829 m).   Weight as of this encounter: 160.1 kg.   Subjective: Patient feels better Bloody oozing noted No fever or chills No SOB No chest pain  Objective: Vitals:   10/30/19 1954 10/31/19 0623 10/31/19 0624 10/31/19 1049  BP: (!) 134/92  (!) 158/74 135/65  Pulse: 94  71 77  Resp: 18  20 19   Temp: (!) 97.4 F (36.3 C)  97.8 F (36.6 C) 97.8 F (36.6 C)  TempSrc: Oral  Oral Oral  SpO2: 94%  96% 96%  Weight:  (!) 160.1 kg    Height:        Intake/Output Summary (Last 24 hours) at 10/31/2019 1537 Last data filed at 10/31/2019 1310 Gross per 24 hour  Intake 1200 ml  Output 935 ml  Net 265 ml   Filed Weights   10/29/19 0505 10/30/19 0300 10/31/19 0623  Weight: (!) 164.7 kg (!) 161.5 kg (!) 160.1 kg    Examination:  General exam: Appears calm and comfortable.  Patient is morbidly obese. Respiratory system: Clear to auscultation. Respiratory effort normal. Cardiovascular system: S1 & S2 heard Gastrointestinal system: Abdomen is morbidly obese.  Organs are difficult to assess.   Central nervous system: Alert and oriented.  Patient moves all extremities. Extremities: Right lower extremity is swollen, with postop bandage.  Data Reviewed: I have personally reviewed following labs and imaging studies  CBC: Recent Labs  Lab 10/25/19 0543 10/27/19 0413 10/28/19 0422 10/29/19 0328 10/30/19 0708 10/31/19 0451  WBC 22.1* 18.2* 20.7* 19.3* 13.4* 15.5*  NEUTROABS 18.5* 14.5*  --   --   --   --   HGB 11.4* 11.4* 11.1* 10.9* 11.9* 10.2*  HCT 34.2* 35.1* 34.8* 33.9* 38.2* 32.0*  MCV 89.1 90.5 91.3 91.1 91.2 91.2  PLT 240 343 349 385 428* [id number]   Basic Metabolic Panel: Recent Labs  Lab 10/27/19 0413 10/28/19 0422 10/29/19 0328 10/30/19 0708 10/31/19 0451  NA 136 139 138 140 136  K 4.0 4.1 3.9 3.9 4.6  CL 99 98 100 99 97*   CO2 24 27 28  32 27  GLUCOSE 121* 123* 126* 121* 151*  BUN 34* 27* 26* 20 27*  CREATININE 1.31* 1.23 1.20 1.26* 1.35*  CALCIUM 8.5* 8.6* 8.6* 8.7* 8.3*  MG 2.2 2.2 1.9 2.0 2.2   GFR: Estimated Creatinine Clearance: 81.9 mL/min (A) (by C-G formula based on SCr of 1.35 mg/dL (H)). Liver Function Tests: No results for input(s): AST, ALT, ALKPHOS, BILITOT, PROT, ALBUMIN in the last 168 hours. No results for input(s): LIPASE, AMYLASE in the last 168 hours. No results for input(s): AMMONIA in the last 168 hours. Coagulation Profile: No results for input(s): INR, PROTIME in the last 168 hours. Cardiac Enzymes: No results for input(s): CKTOTAL, CKMB, CKMBINDEX, TROPONINI in the last 168 hours. BNP (last 3 results) No results for input(s): PROBNP in the last 8760 hours. HbA1C: No results for input(s): HGBA1C in the last 72 hours. CBG: Recent Labs  Lab 10/30/19 1417  GLUCAP 122*   Lipid Profile: No results for input(s): CHOL, HDL, LDLCALC, TRIG, CHOLHDL, LDLDIRECT in the last 72 hours. Thyroid Function Tests: No results for input(s): TSH, T4TOTAL, FREET4, T3FREE, THYROIDAB in the last 72 hours. Anemia Panel: No results for input(s): VITAMINB12, FOLATE, FERRITIN, TIBC, IRON, RETICCTPCT in the last 72  hours. Sepsis Labs: No results for input(s): PROCALCITON, LATICACIDVEN in the last 168 hours.  Recent Results (from the past 240 hour(s))  Blood Culture (routine x 2)     Status: None   Collection Time: 10/24/19  1:00 PM   Specimen: BLOOD LEFT HAND  Result Value Ref Range Status   Specimen Description BLOOD LEFT HAND  Final   Special Requests   Final    BOTTLES DRAWN AEROBIC AND ANAEROBIC Blood Culture adequate volume   Culture   Final    NO GROWTH 5 DAYS Performed at [facility], [address] [place], [place] [address]    Report Status 10/29/2019 FINAL  Final  SARS CORONAVIRUS 2 (TAT 6-24 HRS) Nasopharyngeal Nasopharyngeal Swab     Status: None   Collection Time:  10/24/19  1:05 PM   Specimen: Nasopharyngeal Swab  Result Value Ref Range Status   SARS Coronavirus 2 NEGATIVE NEGATIVE Final    Comment: (NOTE) SARS-CoV-2 target nucleic acids are NOT DETECTED. The SARS-CoV-2 RNA is generally detectable in upper and lower respiratory specimens during the acute phase of infection. Negative results do not preclude SARS-CoV-2 infection, do not rule out co-infections with other pathogens, and should not be used as the sole basis for treatment or other patient management decisions. Negative results must be combined with clinical observations, patient history, and epidemiological information. The expected result is Negative. Fact Sheet for Patients: SugarRoll.be Fact Sheet for Healthcare Providers: https://www.woods-mathews.com/ This test is not yet approved or cleared by the [place] FDA and  has been authorized for detection and/or diagnosis of SARS-CoV-2 by FDA under an Emergency Use Authorization (EUA). This EUA will remain  in effect (meaning this test can be used) for the duration of the COVID-19 declaration under Section 56 4(b)(1) of the Act, 21 U.S.C. section 360bbb-3(b)(1), unless the authorization is terminated or revoked sooner. Performed at [facility], [place] [address], [place], [place] [address]   Blood Culture (routine x 2)     Status: None   Collection Time: 10/24/19  2:45 PM   Specimen: BLOOD RIGHT HAND  Result Value Ref Range Status   Specimen Description BLOOD RIGHT HAND  Final   Special Requests   Final    BOTTLES DRAWN AEROBIC AND ANAEROBIC Blood Culture adequate volume   Culture   Final    NO GROWTH 5 DAYS Performed at [facility], [place] [address], [place], [place] [address]    Report Status 10/29/2019 FINAL  Final  Urine culture     Status: None   Collection Time: 10/24/19  8:20 PM   Specimen: In/Out Cath Urine  Result Value Ref Range Status   Specimen Description  IN/OUT CATH URINE  Final   Special Requests NONE  Final   Culture   Final    NO GROWTH Performed at [facility], [place] [address], [place], [place] [address]    Report Status 10/26/2019 FINAL  Final  Surgical pcr screen     Status: None   Collection Time: 10/30/19 11:55 AM   Specimen: Nasal Mucosa; Nasal Swab  Result Value Ref Range Status   MRSA, PCR NEGATIVE NEGATIVE Final   Staphylococcus aureus NEGATIVE NEGATIVE Final    Comment: (NOTE) The Xpert SA Assay (FDA approved for NASAL specimens in patients 65 years of age and older), is one component of a comprehensive surveillance program. It is not intended to diagnose infection nor to guide or monitor treatment. Performed at [facility], [place] [address] [place], [place] [address]   Aerobic/Anaerobic Culture (surgical/deep wound)     Status: None (Preliminary result)   Collection Time: 10/30/19  1:39 PM   Specimen: Soft Tissue, Other  Result Value Ref Range Status   Specimen Description TISSUE RIGHT LEG  Final   Special Requests NONE  Final   Gram Stain   Final    RARE WBC PRESENT,BOTH PMN AND MONONUCLEAR NO ORGANISMS SEEN    Culture   Final    NO GROWTH < 24 HOURS Performed at [facility], [place] [address], [place], [place] [address]    Report Status PENDING  Incomplete         Radiology Studies: No results found.      Scheduled Meds: . [START ON 11/02/2019] apixaban  5 mg Oral BID  . bumetanide  2 mg Oral BID  . carvedilol  25 mg Oral BID WC  . docusate sodium  100 mg Oral BID  . linezolid  600 mg Oral Q12H  . senna  1 tablet Oral BID   Continuous Infusions: . sodium chloride 250 mL (10/26/19 1204)     LOS: 6 days    Bonnell Public, MD [facility]  If 7PM-7AM, please contact night-coverage [email or web address] Password [facility] 10/31/2019, 3:37 PM

## 2019-10-31 NOTE — Progress Notes (Signed)
Subjective:  68 year old male with history of A. Fib on Eliquis, AAS, diastolic CHF, morbid obesity, spinal stenosis, right knee replacement, and OSA presented to the ED on 12/20 with complaint of right leg pain with rash. He presented with fevers up to 100.8, leokocytosis at 17,000 and has been on IV antibiotics. He has noted significant improvement in pain and overall appearance of skin since presenting to the ED and starting IV antibiotics.   He was taken to the OR on 12/26 for an irrigation and debridement of his right leg. There was concern for abscess on his right medial thigh, however needle aspiration was performed on this area and no pus or fluid was pulled from the area.  Today patient reports right lag pain is moderate and is worse at the site of his former bulla his medial thigh. He reports bleeding from his bandages around his proximal right leg, his bandages were changed over night. Patient denies fever, chills, or abdominal pain.    Objective:   VITALS:   Vitals:   10/30/19 1954 10/31/19 0623 10/31/19 0624 10/31/19 1049  BP: (!) 134/92  (!) 158/74 135/65  Pulse: 94  71 77  Resp: 18  20 19   Temp: (!) 97.4 F (36.3 C)  97.8 F (36.6 C) 97.8 F (36.6 C)  TempSrc: Oral  Oral Oral  SpO2: 94%  96% 96%  Weight:  (!) 160.1 kg    Height:       General: Alert, seated in chair. In no acute distress. Cardiovascular: continued right lower extremity pedal edema and pretibial edema. Resp: no use of accessory musculature GI: abdomen is soft and non-tender Skin: Xeroform gauze intact on medial and lateral ankle. Xeroform gauze had been removed from medial thigh. Dressings showed signs of previous bleeding from area of former medial thigh bulla. No further bulla formation or soughing of skin.  Musculoskeletal: Well healed surgical wound over right knee. Able to lift his right leg 2-3 inches off of bed. Able to move from chair to bed with support from clinical team.    Lab  Results  Component Value Date   WBC 15.5 (H) 10/31/2019   HGB 10.2 (L) 10/31/2019   HCT 32.0 (L) 10/31/2019   MCV 91.2 10/31/2019   PLT 399 10/31/2019   BMET    Component Value Date/Time   NA 136 10/31/2019 0451   NA 138 08/05/2019 1034   K 4.6 10/31/2019 0451   CL 97 (L) 10/31/2019 0451   CL 101 08/08/2015 0000   CO2 27 10/31/2019 0451   CO2 32 08/08/2015 0000   GLUCOSE 151 (H) 10/31/2019 0451   BUN 27 (H) 10/31/2019 0451   BUN 18 08/05/2019 1034   CREATININE 1.35 (H) 10/31/2019 0451   CREATININE 1.02 10/08/2016 1144   CALCIUM 8.3 (L) 10/31/2019 0451   CALCIUM 9.3 08/08/2015 0000   GFRNONAA 54 (L) 10/31/2019 0451   GFRNONAA 77 10/08/2016 1144   GFRAA >60 10/31/2019 0451   GFRAA 89 10/08/2016 1144     Assessment/Plan: 1 Day Post-Op   Principal Problem:   Necrotizing celluliitis of lower leg ([place]), right Active Problems:   Chronic congestive heart failure (HCC)   Longstanding persistent atrial fibrillation (HCC)   Necrotizing cellulitis of right lower extremity, s/p irrigation and debridement: - dressings changed, new Xeroform dressings applied to both ankle and medial thigh. Dressings bulked with more sterile gauze and sterile abd's and a light compressive wrap was applied. Dressings to be changed  overnight should there be further leakage. - I will apply new sterile dressings in the morning - continue current antibiotic treatment per medicine and ID   Ventura Bruns 10/31/2019, 2:17 PM

## 2019-10-31 NOTE — Plan of Care (Signed)
  Problem: Education: Goal: Ability to demonstrate management of disease process will improve Outcome: Progressing   Problem: Education: Goal: Ability to verbalize understanding of medication therapies will improve Outcome: Progressing   Problem: Pain Managment: Goal: General experience of comfort will improve Outcome: Progressing

## 2019-10-31 NOTE — Progress Notes (Signed)
Pt has his home CPAP machine and is able to place himself on without difficulty. RT will continue to monitor.  

## 2019-10-31 NOTE — Progress Notes (Signed)
Spoke with Dr. Marthenia Rolling regarding bleeding on surgical site. Pt. Is on Eliquis he said to call Orthopedics. Spoke with Dr. Havery Moros, ordered to hold Eliquis for 2 days. [facility] made aware.

## 2019-10-31 NOTE — Anesthesia Postprocedure Evaluation (Signed)
Anesthesia Post Note  Patient: Joel Dominguez  Procedure(s) Performed: IRRIGATION AND DEBRIDEMENT EXTREMITY, right leg possible wound vac placement (Right )     Patient location during evaluation: PACU Anesthesia Type: General Level of consciousness: sedated, patient cooperative and oriented Pain management: pain level controlled Vital Signs Assessment: post-procedure vital signs reviewed and stable Respiratory status: spontaneous breathing, nonlabored ventilation, respiratory function stable and patient connected to nasal cannula oxygen Cardiovascular status: blood pressure returned to baseline and stable Postop Assessment: no apparent nausea or vomiting Anesthetic complications: no    Last Vitals:  Vitals:   10/30/19 1638 10/30/19 1954  BP: (!) 116/59 (!) 134/92  Pulse: 94 94  Resp: 20 18  Temp: 36.8 C (!) 36.3 C  SpO2: 94% 94%    Last Pain:  Vitals:   10/30/19 2136  TempSrc:   PainSc: Asleep                 Jessabelle Markiewicz,E. Kendra Woolford

## 2019-10-31 NOTE — Plan of Care (Signed)
°  Problem: Education: °Goal: Ability to demonstrate management of disease process will improve °Outcome: Progressing °Goal: Ability to verbalize understanding of medication therapies will improve °Outcome: Progressing °Goal: Individualized Educational Video(s) °Outcome: Progressing °  °

## 2019-10-31 NOTE — Progress Notes (Signed)
Pt complained of pain behind knee and underneath blanket found pt in pool of blood  Ortho MD notified  Orders given to change dressing as needed and reinforce with bleeding.  Will continue to monitor

## 2019-11-01 LAB — RENAL FUNCTION PANEL
Albumin: 2.2 g/dL — ABNORMAL LOW (ref 3.5–5.0)
Anion gap: 8 (ref 5–15)
BUN: 33 mg/dL — ABNORMAL HIGH (ref 8–23)
CO2: 31 mmol/L (ref 22–32)
Calcium: 8.3 mg/dL — ABNORMAL LOW (ref 8.9–10.3)
Chloride: 97 mmol/L — ABNORMAL LOW (ref 98–111)
Creatinine, Ser: 1.62 mg/dL — ABNORMAL HIGH (ref 0.61–1.24)
GFR calc Af Amer: 50 mL/min — ABNORMAL LOW (ref >60)
GFR calc non Af Amer: 43 mL/min — ABNORMAL LOW (ref >60)
Glucose, Bld: 137 mg/dL — ABNORMAL HIGH (ref 70–99)
Phosphorus: 4 mg/dL (ref 2.5–4.6)
Potassium: 5 mmol/L (ref 3.5–5.1)
Sodium: 136 mmol/L (ref 135–145)

## 2019-11-01 LAB — CBC
HCT: 28.1 % — ABNORMAL LOW (ref 39.0–52.0)
Hemoglobin: 8.8 g/dL — ABNORMAL LOW (ref 13.0–17.0)
MCH: 28.9 pg (ref 26.0–34.0)
MCHC: 31.3 g/dL (ref 30.0–36.0)
MCV: 92.1 fL (ref 80.0–100.0)
Platelets: 464 10*3/uL — ABNORMAL HIGH (ref 150–400)
RBC: 3.05 MIL/uL — ABNORMAL LOW (ref 4.22–5.81)
RDW: 13.5 % (ref 11.5–15.5)
WBC: 12.6 10*3/uL — ABNORMAL HIGH (ref 4.0–10.5)
nRBC: 0 % (ref 0.0–0.2)

## 2019-11-01 LAB — MAGNESIUM: Magnesium: 2.3 mg/dL (ref 1.7–2.4)

## 2019-11-01 MED ORDER — CARVEDILOL 6.25 MG PO TABS
6.2500 mg | ORAL_TABLET | Freq: Two times a day (BID) | ORAL | Status: DC
  Administered 2019-11-01 – 2019-11-03 (×5): 6.25 mg via ORAL
  Filled 2019-11-01 (×5): qty 1

## 2019-11-01 MED ORDER — SODIUM CHLORIDE 0.9 % IV BOLUS: 500.0000 mL | Freq: Once | INTRAVENOUS | Status: DC

## 2019-11-01 MED ORDER — SODIUM CHLORIDE 0.9 % IV SOLN: Freq: Once | INTRAVENOUS | Status: AC

## 2019-11-01 NOTE — Consult Note (Signed)
[facility]  Reason for Consult: Right lower extremity wound/cellulitis Referring Physician: Marchia Bond, MD.  Joel Dominguez is an 68 y.o. male.  HPI: Joel Dominguez is a 68 year old male with a history of A. fib, diastolic CHF, morbid obesity, aortic stenosis, moderate MR, OSA, chronic anticoagulation who presented to the ED on 12/20 for evaluation of right leg pain with rash.  He reported that the rash started 2 days prior to his visit to the ED and on eval in the ED rash/erythema was noted to be nearing the level of his groin.   Ortho consulted on 10/29/19. Noted at that time patient has hx of R total knee replacement > 10 years ago which resulted in a neurologic complication, causing foot drop. He also has a hx of a hammertoe with ulceration at the time. For the hammertoe, his wife assists him with local wound care. He underwent excisional debridement of right distal medial leg, right distal lateral ankle and right distal medial thigh on 10/30/19 with Dr. Mardelle Matte.   Patient currently on Linezolid (zyvox) which was started by ID on 10/28/19. Eliquis d/c'ed this AM for persistent bleeding post-op.    Patient had right and left DVT US on 10/27/19, which was negative. CT of lower right extremity on 10/28/19 showed no sign of osteomyelitis, diffuse findings suggestive of cellulitis over posterior medial LE.   EMR photos reviewed.  Today patient reports he is overall doing well. He does have some pain in his leg, but he tolerated dressing change of distal wound today. No fevers, n/v. He does report some chills, but reports this is normal for him. He is able to move toes freely, good cap refill. Last night he had some bleeding from right proximal wound, it was re-wrapped with a tight dressing and has not had any bleeding since.   Past Medical History:  Diagnosis Date  . A-fib- couple yrs now. 10/08/2016  . Aortic stenosis 11/06/2017  . Atherosclerotic heart disease of native  coronary artery with unspecified angina pectoris ([place]) 10/08/2016  . CHF (congestive heart failure) ([place])   . Chronic anticoagulation 10/08/2016  . Foot drop- R 10/08/2016   Patient known foot drop for many years due to lumbar spinal stenosis.-Over the past couple of months symptoms have gotten worse.  He feels he is tripping more and cannot lift his foot up as much. Toes are curling and calluses on end of toes.    - Did see spinal surgeon many yrs ago.  Declined sx at that time.     . Generalized OA 10/08/2016   Ortho doc-  Dr. [facility] in [place]- both knees replaced 08, 09.     . H/O noncompliance with medical treatment, presenting hazards to health 10/22/2017  . Heart disease   . Hypertension   . Obesity, Class III, BMI 40-49.9 (morbid obesity) ([place]) 10/08/2016  . OSA (obstructive sleep apnea) 10/08/2016   Bi-PAP nitely- been 20+ yrs now  . Pulmonary hypertension ([place]) 10/08/2016  . Spinal stenosis, lumbar region, with neurogenic claudication 10/08/2016   Back specialist in [place]-    only surgeon he would use- pt declined referral here for back pain mgt    . Stroke [facility])     Past Surgical History:  Procedure Laterality Date  . I & D EXTREMITY Right 10/30/2019   Procedure: IRRIGATION AND DEBRIDEMENT EXTREMITY, right leg possible wound vac placement;  Surgeon: Marchia Bond, MD;  Location: [facility];  Service: Orthopedics;  Laterality: Right;  . REPLACEMENT  TOTAL KNEE Bilateral   . TIBIA FRACTURE SURGERY      Family History  Problem Relation Age of Onset  . Congestive Heart Failure Mother   . Hypertension Mother   . Diabetes Mother   . Cancer Father        lung  . Congestive Heart Failure Sister   . Hypertension Sister   . Hypertension Brother   . Hypertension Sister   . Diabetes Brother     Social History:  reports that he has never smoked. He has never used smokeless tobacco. He reports previous alcohol use. He reports that he does not use drugs.  Allergies:  Allergies  Allergen  Reactions  . Cephalexin Rash    Medications: I have reviewed the patient's current medications.  Results for orders placed or performed during the hospital encounter of 10/24/19 (from the past 48 hour(s))  Surgical pcr screen     Status: None   Collection Time: 10/30/19 11:55 AM   Specimen: Nasal Mucosa; Nasal Swab  Result Value Ref Range   MRSA, PCR NEGATIVE NEGATIVE   Staphylococcus aureus NEGATIVE NEGATIVE    Comment: (NOTE) The Xpert SA Assay (FDA approved for NASAL specimens in patients 29 years of age and older), is one component of a comprehensive surveillance program. It is not intended to diagnose infection nor to guide or monitor treatment. Performed at [facility], [place] [address] [place], [place] [address]   Aerobic/Anaerobic Culture (surgical/deep wound)     Status: None (Preliminary result)   Collection Time: 10/30/19  1:39 PM   Specimen: Soft Tissue, Other  Result Value Ref Range   Specimen Description TISSUE RIGHT LEG    Special Requests NONE    Gram Stain      RARE WBC PRESENT,BOTH PMN AND MONONUCLEAR NO ORGANISMS SEEN    Culture      NO GROWTH < 24 HOURS Performed at [facility], [place] [address], [place], [place] [address]    Report Status PENDING   Glucose, capillary     Status: Abnormal   Collection Time: 10/30/19  2:17 PM  Result Value Ref Range   Glucose-Capillary 122 (H) 70 - 99 mg/dL  BMP-daily     Status: Abnormal   Collection Time: 10/31/19  4:51 AM  Result Value Ref Range   Sodium 136 135 - 145 mmol/L   Potassium 4.6 3.5 - 5.1 mmol/L   Chloride 97 (L) 98 - 111 mmol/L   CO2 27 22 - 32 mmol/L   Glucose, Bld 151 (H) 70 - 99 mg/dL   BUN 27 (H) 8 - 23 mg/dL   Creatinine, Ser 1.35 (H) 0.61 - 1.24 mg/dL   Calcium 8.3 (L) 8.9 - 10.3 mg/dL   GFR calc non Af Amer 54 (L) >60 mL/min   GFR calc Af Amer >60 >60 mL/min   Anion gap 12 5 - 15    Comment: Performed at [facility] [address] [place], [place] [address]    Magnesium-daily     Status: None   Collection Time: 10/31/19  4:51 AM  Result Value Ref Range   Magnesium 2.2 1.7 - 2.4 mg/dL    Comment: Performed at [facility] [address], [place] [address]  CBC     Status: Abnormal   Collection Time: 10/31/19  4:51 AM  Result Value Ref Range   WBC 15.5 (H) 4.0 - 10.5 K/uL   RBC 3.51 (L) 4.22 - 5.81  MIL/uL   Hemoglobin 10.2 (L) 13.0 - 17.0 g/dL   HCT 32.0 (L) 39.0 - 52.0 %   MCV 91.2 80.0 - 100.0 fL   MCH 29.1 26.0 - 34.0 pg   MCHC 31.9 30.0 - 36.0 g/dL   RDW 13.5 11.5 - 15.5 %   Platelets 399 150 - 400 K/uL   nRBC 0.0 0.0 - 0.2 %    Comment: Performed at [facility] [address], [place], [place] [address]  CBC     Status: Abnormal   Collection Time: 11/01/19  3:43 AM  Result Value Ref Range   WBC 12.6 (H) 4.0 - 10.5 K/uL   RBC 3.05 (L) 4.22 - 5.81 MIL/uL   Hemoglobin 8.8 (L) 13.0 - 17.0 g/dL   HCT 28.1 (L) 39.0 - 52.0 %   MCV 92.1 80.0 - 100.0 fL   MCH 28.9 26.0 - 34.0 pg   MCHC 31.3 30.0 - 36.0 g/dL   RDW 13.5 11.5 - 15.5 %   Platelets 464 (H) 150 - 400 K/uL   nRBC 0.0 0.0 - 0.2 %    Comment: Performed at [facility], [place] [address], [place], [place] [address]  Renal function panel     Status: Abnormal   Collection Time: 11/01/19  3:43 AM  Result Value Ref Range   Sodium 136 135 - 145 mmol/L   Potassium 5.0 3.5 - 5.1 mmol/L   Chloride 97 (L) 98 - 111 mmol/L   CO2 31 22 - 32 mmol/L   Glucose, Bld 137 (H) 70 - 99 mg/dL   BUN 33 (H) 8 - 23 mg/dL   Creatinine, Ser 1.62 (H) 0.61 - 1.24 mg/dL   Calcium 8.3 (L) 8.9 - 10.3 mg/dL   Phosphorus 4.0 2.5 - 4.6 mg/dL   Albumin 2.2 (L) 3.5 - 5.0 g/dL   GFR calc non Af Amer 43 (L) >60 mL/min   GFR calc Af Amer 50 (L) >60 mL/min   Anion gap 8 5 - 15    Comment: Performed at [facility] [address] [place], [place] [address]  Magnesium     Status: None   Collection Time: 11/01/19  3:43 AM  Result Value Ref Range   Magnesium 2.3 1.7 - 2.4  mg/dL    Comment: Performed at [facility] [address], [place],  [address]    No results found.  Review of Systems  Constitutional: Positive for chills. Negative for fever and malaise/fatigue.  Respiratory: Negative for cough and shortness of breath.   Gastrointestinal: Negative for diarrhea, nausea and vomiting.  Musculoskeletal: Positive for back pain.  Skin: Positive for rash. Negative for itching.  Neurological: Negative for sensory change.       + spasm of right foot (baseline)    Blood pressure (!) 106/52, pulse 93, temperature 98.8 F (37.1 C), temperature source Oral, resp. rate 16, height 6' (1.829 m), weight (!) 158.8 kg, SpO2 96 %. Physical Exam  Constitutional: He is oriented to person, place, and time. He appears well-developed and well-nourished. No distress.  HENT:  Head: Normocephalic and atraumatic.  Cardiovascular: Normal rate.  Respiratory: Effort normal.  Musculoskeletal:        General: Tenderness and edema present.  Neurological: He is alert and oriented to person, place, and time.  Skin: Skin is warm. He is not diaphoretic.     Psychiatric: He has a normal mood and affect. Judgment normal.    Assessment/Plan:  Necrotizing cellulitis of RLE:  Hgb drop from 10.2 yesterday to 8.8 this AM. Monitoring. Ortho d/c'ed Eliquis.  Continue with local wound care per ortho team and their recommendations in regards to keeping thigh wound dressing in place. Continue with xeroform daily, abd, 4x4, kerlix, ace wrap to distal RLE wound.  Unable to assess RLE upper thigh wound due to recommendations to leave tight ace wrap in place due to bleeding. Spoke with ortho team, will take photo at next dressing change for [place] to evaluate.   Appreciate consult. Will continue to monitor for photo of thigh wound in EMR.  Pictures were obtained of the patient and placed in the chart with the patient's or guardian's permission.   Carola Rhine Gayland Nicol,  PA-C 11/01/2019, 9:48 AM

## 2019-11-01 NOTE — Progress Notes (Signed)
PROGRESS NOTE    Joel Dominguez  [id number] DOB: October 30, 1951 DOA: 10/24/2019 PCP: Mellody Dance, DO   Brief Narrative: 68 year old male with history of A. fib on Eliquis, AAS, diastolic CHF, moderate MR, morbid obesity with BMI 47, obese OSA on CPAP presents with right leg leg necrotizing cellulitis.  On presentation he was febrile, leukocytosis 20 K underwent excisional debridement of the skin and subcutaneous tissue of the right lower extremity by Ortho 12/26.  Patient has been on Zyvox wound culture results no growth so far.  Nasal swab with MRSA Eliquis has been held preop. 12/27-12/28 during night significant bleeding/oozing from the right lower extremity from orthopedic saw the patient during the night and applied tight dressing in place  Subjective: Overnight had bleeding form rt leg ortho came and changed dressing and has tight dressing in place, no more bleeding  Not feels better Also had hypotension and hb dropped to 8.8 form 10 Coreg duet back to 6.25 mg for hypotension and continued due to a fib  Assessment & Plan:   Necrotizing celluliitis of right lower extremity with severe right leg cellulitis: on presentation febrile, leukocytosis 20k No DVT on Doppler.  CT showed no evidence of deep abscess but superficial fluid collection over the posterior medial lower extremity.underwent excisional debridement of the skin and subcutaneous tissue of the right lower extremity by Ortho 12/26.  Patient is afebrile leukocytosis nicely improving.  Appetite dressing in place due to bleeding.  Ortho and plastics following. Appreciate plastic surgery input, plastic surgery will follow up on next dressing change to assess for patient's wound/picture. Aerobic/anaerobic culture from the wound:Gram stain with rare WBC, wound culture no growth so far. MRSA nose negative Cont Zyvox for now. Recent Labs  Lab 10/28/19 0422 10/29/19 0328 10/30/19 0708 10/31/19 0451 11/01/19 0343  WBC 20.7* 19.3*  13.4* 15.5* 12.6*   Bleeding from the right lower extremity debrided side, Eliquis has been on hold.  Noted drop in hemoglobin.  Repeat CBC in a.m.  Continue tight dressing applied by ortho-currently no more bleeding.  Acute blood loss anemia due to above: Hemoglobin 8.8 g from 10.2.  Monitor and transfuse if less than 7 g.  Holding Eliquis. Recent Labs  Lab 10/28/19 0422 10/29/19 0328 10/30/19 0708 10/31/19 0451 11/01/19 0343  HGB 11.1* 10.9* 11.9* 10.2* 8.8*  HCT 34.8* 33.9* 38.2* 32.0* 28.1*   AKI on CKD stage III a : Creatinine up trended to 1.6 overnight. Initially 2.0 (12/7)->1.88 on admit.  Patient received fluid this morning.  Repeat BMP in a.m. if creatinine uptrending hold Bumex and consider underlying fluids. Recent Labs  Lab 10/28/19 0422 10/29/19 0328 10/30/19 0708 10/31/19 0451 11/01/19 0343  BUN 27* 26* 20 27* 33*  CREATININE 1.23 1.20 1.26* 1.35* 1.62*   Cronic congestive heart failure/pulmonary hypertension: Continue home medication  Essential hypertension/Hypotension overnight needing bolus normal saline 500 ml. I reduce Coreg to 6.25 from 25.  Resume home dose if blood pressure starts to go up, add as needed IV Lopressor in case of for A. fib RVR   Persistent atrial fibrillation: Intermittently and RVR continue Coreg, continue as needed Lopressor.  Holding Eliquis.  CVA history without residual deficit.  Stable.  Unable to resume Eliquis due to bleeding.  OSA on CPAP cont bedtime and prn sleep   Morbid obesity with BMI Body mass index is 47.47 kg/m.  Will need outpatient follow-up for weight loss and healthy lifestyle.  Pressure Ulcer: Pressure Injury 10/28/19 Buttocks Right;Left;Mid Deep Tissue Pressure Injury -  Purple or maroon localized area of discolored intact skin or blood-filled blister due to damage of underlying soft tissue from pressure and/or shear.  (Active)  10/28/19 0800  Location: Buttocks  Location Orientation: Right;Left;Mid  Staging:  Deep Tissue Pressure Injury - Purple or maroon localized area of discolored intact skin or blood-filled blister due to damage of underlying soft tissue from pressure and/or shear.  Wound Description (Comments): -- (red and purple R&L buttocks, crack in the middle)  Present on Admission: -- (unsure)    DVT prophylaxis: SCD- hold chemical prohpylaxis due to bleeding Code Status: full Family Communication: plan of care discussed with patient, no family at bedside.  Patient reports he jsut spoke to the wife Disposition Plan: Remains inpatient pending clinical improvement in his fall, bleeding and further plan from plastics Consultants: ortho, plastic surgery.  Procedures: Debridement of right lower extremity wound Microbiology: Aerobic/anaerobic culture from the wound Gram stain with rare WBC, wound culture no growth so far MRSA nose negative Antimicrobials: Anti-infectives (From admission, onward)   Start     Dose/Rate Route Frequency Ordered Stop   10/28/19 1800  linezolid (ZYVOX) tablet 600 mg     600 mg Oral Every 12 hours 10/28/19 1509     10/28/19 1515  linezolid (ZYVOX) tablet 600 mg  Status:  Discontinued     600 mg Oral Every 12 hours 10/28/19 1500 10/28/19 1501   10/28/19 1430  linezolid (ZYVOX) IVPB 600 mg  Status:  Discontinued     600 mg 300 mL/hr over 60 Minutes Intravenous Every 12 hours 10/28/19 1425 10/28/19 1500   10/26/19 1200  ampicillin (OMNIPEN) 2 g in sodium chloride 0.9 % 100 mL IVPB  Status:  Discontinued     2 g 300 mL/hr over 20 Minutes Intravenous Every 4 hours 10/26/19 1009 10/28/19 1421   10/25/19 1500  vancomycin (VANCOREADY) IVPB 1500 mg/300 mL  Status:  Discontinued     1,500 mg 150 mL/hr over 120 Minutes Intravenous Every 24 hours 10/24/19 1354 10/26/19 1009   10/24/19 2200  aztreonam (AZACTAM) 2 g in sodium chloride 0.9 % 100 mL IVPB  Status:  Discontinued     2 g 200 mL/hr over 30 Minutes Intravenous Every 8 hours 10/24/19 1354 10/25/19 0956    10/24/19 1300  vancomycin (VANCOCIN) 2,500 mg in sodium chloride 0.9 % 500 mL IVPB     2,500 mg 250 mL/hr over 120 Minutes Intravenous  Once 10/24/19 1259 10/24/19 1642   10/24/19 1245  vancomycin (VANCOCIN) IVPB 1000 mg/200 mL premix  Status:  Discontinued     1,000 mg 200 mL/hr over 60 Minutes Intravenous  Once 10/24/19 1240 10/24/19 1259   10/24/19 1245  aztreonam (AZACTAM) 2 g in sodium chloride 0.9 % 100 mL IVPB     2 g 200 mL/hr over 30 Minutes Intravenous  Once 10/24/19 1240 10/24/19 1819       Objective: Vitals:   11/01/19 0246 11/01/19 0250 11/01/19 0342 11/01/19 0806  BP: (!) 89/54 (!) 102/49 99/60 (!) 106/52  Pulse: 66 86 89 93  Resp:   20 16  Temp:   (!) 97.1 F (36.2 C) 98.8 F (37.1 C)  TempSrc:    Oral  SpO2: 93% 97% 96% 96%  Weight:   (!) 158.8 kg   Height:        Intake/Output Summary (Last 24 hours) at 11/01/2019 0842 Last data filed at 11/01/2019 0754 Gross per 24 hour  Intake 2170 ml  Output 1120 ml  Net  1050 ml   Filed Weights   10/30/19 0300 10/31/19 0623 11/01/19 0342  Weight: (!) 161.5 kg (!) 160.1 kg (!) 158.8 kg   Weight change: -1.361 kg  Body mass index is 47.47 kg/m.  Intake/Output from previous day: 12/27 0701 - 12/28 0700 In: 1930 [P.O.:930; IV Piggyback:1000] Out: 1120 [Urine:1120] Intake/Output this shift: Total I/O In: 360 [P.O.:360] Out: -   Examination:  General exam: AAOx3,NAD,Obese,weak appearing. HEENT:Oral mucosa moist, Ear/Nose WNL grossly, dentition normal. Respiratory system: Diminished at the base,no wheezing or crackles,no use of accessory muscle Cardiovascular system: S1 & S2 +, No JVD,. Gastrointestinal system: Abdomen soft,Obese, NT,ND, BS+ Nervous System:Alert, awake, moving extremities and grossly nonfocal Extremities:  Edema + on RLE, Rt Leg with tight bandage/dresing in lower thigh and distal leg- toes visible and color intact, sensation intact Skin: No rashes,no icterus. MSK: Normal muscle bulk,tone,  power  Medications:  Scheduled Meds: . bumetanide  2 mg Oral BID  . carvedilol  6.25 mg Oral BID WC  . docusate sodium  100 mg Oral BID  . linezolid  600 mg Oral Q12H  . senna  1 tablet Oral BID   Continuous Infusions: . sodium chloride 250 mL (10/26/19 1204)  . sodium chloride      Data Reviewed: I have personally reviewed following labs and imaging studies  CBC: Recent Labs  Lab 10/27/19 0413 10/28/19 0422 10/29/19 0328 10/30/19 0708 10/31/19 0451 11/01/19 0343  WBC 18.2* 20.7* 19.3* 13.4* 15.5* 12.6*  NEUTROABS 14.5*  --   --   --   --   --   HGB 11.4* 11.1* 10.9* 11.9* 10.2* 8.8*  HCT 35.1* 34.8* 33.9* 38.2* 32.0* 28.1*  MCV 90.5 91.3 91.1 91.2 91.2 92.1  PLT 343 349 385 428* 399 [id number]*   Basic Metabolic Panel: Recent Labs  Lab 10/28/19 0422 10/29/19 0328 10/30/19 0708 10/31/19 0451 11/01/19 0343  NA 139 138 140 136 136  K 4.1 3.9 3.9 4.6 5.0  CL 98 100 99 97* 97*  CO2 27 28 32 27 31  GLUCOSE 123* 126* 121* 151* 137*  BUN 27* 26* 20 27* 33*  CREATININE 1.23 1.20 1.26* 1.35* 1.62*  CALCIUM 8.6* 8.6* 8.7* 8.3* 8.3*  MG 2.2 1.9 2.0 2.2 2.3  PHOS  --   --   --   --  4.0   GFR: Estimated Creatinine Clearance: 68 mL/min (A) (by C-G formula based on SCr of 1.62 mg/dL (H)). Liver Function Tests: Recent Labs  Lab 11/01/19 0343  ALBUMIN 2.2*   No results for input(s): LIPASE, AMYLASE in the last 168 hours. No results for input(s): AMMONIA in the last 168 hours. Coagulation Profile: No results for input(s): INR, PROTIME in the last 168 hours. Cardiac Enzymes: No results for input(s): CKTOTAL, CKMB, CKMBINDEX, TROPONINI in the last 168 hours. BNP (last 3 results) No results for input(s): PROBNP in the last 8760 hours. HbA1C: No results for input(s): HGBA1C in the last 72 hours. CBG: Recent Labs  Lab 10/30/19 1417  GLUCAP 122*   Lipid Profile: No results for input(s): CHOL, HDL, LDLCALC, TRIG, CHOLHDL, LDLDIRECT in the last 72 hours. Thyroid  Function Tests: No results for input(s): TSH, T4TOTAL, FREET4, T3FREE, THYROIDAB in the last 72 hours. Anemia Panel: No results for input(s): VITAMINB12, FOLATE, FERRITIN, TIBC, IRON, RETICCTPCT in the last 72 hours. Sepsis Labs: No results for input(s): PROCALCITON, LATICACIDVEN in the last 168 hours.  Recent Results (from the past 240 hour(s))  Blood Culture (routine x 2)  Status: None   Collection Time: 10/24/19  1:00 PM   Specimen: BLOOD LEFT HAND  Result Value Ref Range Status   Specimen Description BLOOD LEFT HAND  Final   Special Requests   Final    BOTTLES DRAWN AEROBIC AND ANAEROBIC Blood Culture adequate volume   Culture   Final    NO GROWTH 5 DAYS Performed at [facility], [address] [place], [place] [address]    Report Status 10/29/2019 FINAL  Final  SARS CORONAVIRUS 2 (TAT 6-24 HRS) Nasopharyngeal Nasopharyngeal Swab     Status: None   Collection Time: 10/24/19  1:05 PM   Specimen: Nasopharyngeal Swab  Result Value Ref Range Status   SARS Coronavirus 2 NEGATIVE NEGATIVE Final    Comment: (NOTE) SARS-CoV-2 target nucleic acids are NOT DETECTED. The SARS-CoV-2 RNA is generally detectable in upper and lower respiratory specimens during the acute phase of infection. Negative results do not preclude SARS-CoV-2 infection, do not rule out co-infections with other pathogens, and should not be used as the sole basis for treatment or other patient management decisions. Negative results must be combined with clinical observations, patient history, and epidemiological information. The expected result is Negative. Fact Sheet for Patients: SugarRoll.be Fact Sheet for Healthcare Providers: https://www.woods-mathews.com/ This test is not yet approved or cleared by the [place] FDA and  has been authorized for detection and/or diagnosis of SARS-CoV-2 by FDA under an Emergency Use Authorization (EUA). This EUA will remain   in effect (meaning this test can be used) for the duration of the COVID-19 declaration under Section 56 4(b)(1) of the Act, 21 U.S.C. section 360bbb-3(b)(1), unless the authorization is terminated or revoked sooner. Performed at [facility], [place] [address], [place], [place] [address]   Blood Culture (routine x 2)     Status: None   Collection Time: 10/24/19  2:45 PM   Specimen: BLOOD RIGHT HAND  Result Value Ref Range Status   Specimen Description BLOOD RIGHT HAND  Final   Special Requests   Final    BOTTLES DRAWN AEROBIC AND ANAEROBIC Blood Culture adequate volume   Culture   Final    NO GROWTH 5 DAYS Performed at [facility], [place] [address] [place], [place] [address]    Report Status 10/29/2019 FINAL  Final  Urine culture     Status: None   Collection Time: 10/24/19  8:20 PM   Specimen: In/Out Cath Urine  Result Value Ref Range Status   Specimen Description IN/OUT CATH URINE  Final   Special Requests NONE  Final   Culture   Final    NO GROWTH Performed at [facility], [place] [address], [place], [place] [address]    Report Status 10/26/2019 FINAL  Final  Surgical pcr screen     Status: None   Collection Time: 10/30/19 11:55 AM   Specimen: Nasal Mucosa; Nasal Swab  Result Value Ref Range Status   MRSA, PCR NEGATIVE NEGATIVE Final   Staphylococcus aureus NEGATIVE NEGATIVE Final    Comment: (NOTE) The Xpert SA Assay (FDA approved for NASAL specimens in patients 83 years of age and older), is one component of a comprehensive surveillance program. It is not intended to diagnose infection nor to guide or monitor treatment. Performed at [facility], [place] [address], [place], [place] [address]   Aerobic/Anaerobic Culture (surgical/deep wound)     Status: None (Preliminary result)   Collection Time: 10/30/19  1:39 PM  Specimen: Soft Tissue, Other  Result Value Ref Range Status   Specimen Description TISSUE RIGHT LEG  Final   Special Requests NONE   Final   Gram Stain   Final    RARE WBC PRESENT,BOTH PMN AND MONONUCLEAR NO ORGANISMS SEEN    Culture   Final    NO GROWTH < 24 HOURS Performed at [facility], [address] [place], [place] [address]    Report Status PENDING  Incomplete      Radiology Studies: No results found.   LOS: 7 days   Time spent: More than 50% of that time was spent in counseling and/or coordination of care.  Antonieta Pert, MD [facility]  11/01/2019, 8:42 AM

## 2019-11-01 NOTE — Progress Notes (Signed)
Subjective:  68 year old male with history of A. Fib on Eliquis, AAS, diastolic CHF, morbid obesity, spinal stenosis, right knee replacement, and OSA presented to the ED on 12/20 with complaint of right leg pain with rash. He presented with fevers up to 100.8, leokocytosis at 17,000 and has been on IV antibiotics. He has noted significant improvement in pain and overall appearance of skin since presenting to the ED and starting IV antibiotics.   He was taken to the OR on 12/26 for an irrigation and debridement of his right leg. There was concern for abscess on his right medial thigh, however needle aspiration was performed on this area and no pus or fluid was pulled from the area.  Dressings were changed at 2:00pm yesterday. At 3:00am this morning patient reported feeling lightheaded and dizzy, he also reported continued bleeding from thigh wound. Dressings were changed. This morning patient is feeling mild fatigue but not longer feeling lightheaded or dizzy. Right lower extremity pain is mild and well controlled.    Objective:   VITALS:   Vitals:   11/01/19 0246 11/01/19 0250 11/01/19 0342 11/01/19 0806  BP: (!) 89/54 (!) 102/49 99/60 (!) 106/52  Pulse: 66 86 89 93  Resp:   20 16  Temp:   (!) 97.1 F (36.2 C) 98.8 F (37.1 C)  TempSrc:    Oral  SpO2: 93% 97% 96% 96%  Weight:   (!) 158.8 kg   Height:        General: [place] for exam, CPAP in place. Laying in bed, in no acute distress. Cardiovascular: continued right lower extremity pedal edema. Resp: no use of accessory musculature, CPAP in place. GI: abdomen is soft and non-tender Skin: Dressings intact, no drainage noted.  Musculoskeletal: EHL and FHL of right lower extremity intact. Strength at ankle 3/5. Able to lift his right leg 2-3 inches off of bed. Sensation and capillary refill intact distally.   Lab Results  Component Value Date   WBC 12.6 (H) 11/01/2019   HGB 8.8 (L) 11/01/2019   HCT 28.1 (L) 11/01/2019   MCV  92.1 11/01/2019   PLT 464 (H) 11/01/2019   BMET    Component Value Date/Time   NA 136 11/01/2019 0343   NA 138 08/05/2019 1034   K 5.0 11/01/2019 0343   CL 97 (L) 11/01/2019 0343   CL 101 08/08/2015 0000   CO2 31 11/01/2019 0343   CO2 32 08/08/2015 0000   GLUCOSE 137 (H) 11/01/2019 0343   BUN 33 (H) 11/01/2019 0343   BUN 18 08/05/2019 1034   CREATININE 1.62 (H) 11/01/2019 0343   CREATININE 1.02 10/08/2016 1144   CALCIUM 8.3 (L) 11/01/2019 0343   CALCIUM 9.3 08/08/2015 0000   GFRNONAA 43 (L) 11/01/2019 0343   GFRNONAA 77 10/08/2016 1144   GFRAA 50 (L) 11/01/2019 0343   GFRAA 89 10/08/2016 1144     Assessment/Plan: 2 Days Post-Op   Principal Problem:   Necrotizing celluliitis of lower leg ([place]), right Active Problems:   Chronic congestive heart failure (HCC)   Longstanding persistent atrial fibrillation (HCC)  Necrotizing cellulitis of right lower extremity, s/p irrigation and debridement: - Vital signs slowly improving since early this morning.  - Hgb decrease from 10.2 on 12/27 to 8.8 this morning, no indication for transfusion at this time - per Dr. Mardelle Matte, Eliquis discontinued due to persistent bleeding from right medial thigh wound - Will continue to not change thigh wound dressing unless saturated - will continue  to closely monitor    Ventura Bruns 11/01/2019, 9:31 AM

## 2019-11-01 NOTE — TOC Benefit Eligibility Note (Signed)
Transition of Care [facility]) Benefit Eligibility Note    Patient Details  Name: Joel Dominguez MRN: [id number] Date of Birth: 1950-12-16   Medication/Dose: LINEZOLID  600 MG BID  , Q/L TWO PILL PER DAY  Covered?: Yes  Tier: 3 Drug  Prescription Coverage Preferred Pharmacy: [facility]  Spoke with Person/Company/Phone Number:: CHRISTINE  @  [employer] [id number] # [phone]  Co-Pay: $19.12  Prior Approval: No     Additional Notes: ZYVOX  600 MG BID : NON-FORMULARY , P/A YES # [id number]    Memory Argue Phone Number: 11/01/2019, 3:10 PM

## 2019-11-01 NOTE — Progress Notes (Signed)
PT Cancellation Note  Patient Details Name: Joel Dominguez MRN: [id number] DOB: 13-Mar-1951   Cancelled Treatment:    Reason Eval/Treat Not Completed: Patient declined, no reason specified Attempted to see pt for PT treatment. Pt in bed upon arrival and reports having a "rough" night last night due to bleeding from thigh wound. Pt reports he was told to not get up today because of the bleeding but did not see this in pt's chart. PT will continue to follow acutely.     Earney Navy, PTA Acute Rehabilitation Services Pager: [phone] Office: [phone]   11/01/2019, 3:26 PM

## 2019-11-01 NOTE — Progress Notes (Signed)
While changing dressing pt complained of dizziness and pt became pale  Vitals taken showed BP of 89/52 HR 64  MD notified and came to visit pt  MD ordered a stat hbg and hct  MD gave a verbal order to give one unit of blood if hbg came back below 7.5 Current vitals  BP (!) 102/49   Pulse 86   Temp 97.6 F (36.4 C) (Oral)   Resp 18   Ht 6' (1.829 m)   Wt (!) 160.1 kg   SpO2 97%   BMI 47.88 kg/m    Will continue to monitor

## 2019-11-01 NOTE — Progress Notes (Signed)
Patient has home CPAP here at hospital.  Asked patient if he needed anything from Respiratory, patient responded that he was all set for tonight.  Patient self manages home unit.  Will continue to monitor.

## 2019-11-01 NOTE — Plan of Care (Signed)
  Problem: Education: Goal: Ability to verbalize understanding of medication therapies will improve Outcome: Progressing   Problem: Education: Goal: Individualized Educational Video(s) Outcome: Progressing   

## 2019-11-01 NOTE — Plan of Care (Signed)
  Problem: Education: Goal: Ability to demonstrate management of disease process will improve Outcome: Progressing   Problem: Cardiac: Goal: Ability to achieve and maintain adequate cardiopulmonary perfusion will improve Outcome: Progressing   Problem: Clinical Measurements: Goal: Ability to maintain clinical measurements within normal limits will improve Outcome: Progressing Goal: Diagnostic test results will improve Outcome: Progressing   Problem: Nutrition: Goal: Adequate nutrition will be maintained Outcome: Progressing   Problem: Skin Integrity: Goal: Risk for impaired skin integrity will decrease Outcome: Progressing Note: Decreased bleeding today. Will continue to monitor   Problem: Pain Managment: Goal: General experience of comfort will improve Outcome: Not Progressing Note: Patient still requiring frequent medication to control pain

## 2019-11-01 NOTE — Progress Notes (Signed)
I was called because patient was feeling lightheaded and somewhat dizzy, and has had persistent bleeding from his thigh wound.  Patient seen and examined.  The current dressing is clean and dry, no evidence for persistent bleeding.  He has a little bit of diaphoresis, current vital signs have a pulse that is in the low 90s, and blood pressure approximately [id number] systolic.  BP (!) 102/49   Pulse 86   Temp 97.6 F (36.4 C) (Oral)   Resp 18   Ht 6' (1.829 m)   Wt (!) 160.1 kg   SpO2 97%   BMI 47.88 kg/m   His leg overall is looking better.  I have discussed with his wife over the phone for over 10 minutes and offered reassurance to the best of my ability that things are clinically actually looking better, he is not alone, his vital signs look okay, his leg is looking overall better, I will plan to get stat labs to evaluate his current hemoglobin, and will coordinate so he does not have multiple blood draws this morning, as I believe he has a CBC with differential ordered as well, and he may have other labs.  We will try and get all of this done with one stick, and I will also give him a normal saline bolus of 500 mL, I am going to discontinue his Eliquis because of his ongoing persistent bleeding.  I would not change his thigh wound again unless it is saturated, we need to achieve some level of hemostasis, and this has been a challenge for the last 24 hours or so.  We will continue to monitor closely, he is on telemetry, and clinically looks okay at the current time.   Johnny Bridge, MD 3:33 AM

## 2019-11-01 NOTE — Progress Notes (Signed)
Pt continues to bleed MD notified second time  Orders given to remove Xeroform and pack with 4x4 gauze  Will continue to monitor

## 2019-11-02 ENCOUNTER — Inpatient Hospital Stay: Payer: PRIVATE HEALTH INSURANCE | Admitting: Family Medicine

## 2019-11-02 LAB — BASIC METABOLIC PANEL
Anion gap: 9 (ref 5–15)
BUN: 28 mg/dL — ABNORMAL HIGH (ref 8–23)
CO2: 30 mmol/L (ref 22–32)
Calcium: 8.4 mg/dL — ABNORMAL LOW (ref 8.9–10.3)
Chloride: 98 mmol/L (ref 98–111)
Creatinine, Ser: 1.35 mg/dL — ABNORMAL HIGH (ref 0.61–1.24)
GFR calc Af Amer: >60 mL/min (ref >60)
GFR calc non Af Amer: 54 mL/min — ABNORMAL LOW (ref >60)
Glucose, Bld: 111 mg/dL — ABNORMAL HIGH (ref 70–99)
Potassium: 4.1 mmol/L (ref 3.5–5.1)
Sodium: 137 mmol/L (ref 135–145)

## 2019-11-02 LAB — CBC
HCT: 25.9 % — ABNORMAL LOW (ref 39.0–52.0)
Hemoglobin: 8.1 g/dL — ABNORMAL LOW (ref 13.0–17.0)
MCH: 28.7 pg (ref 26.0–34.0)
MCHC: 31.3 g/dL (ref 30.0–36.0)
MCV: 91.8 fL (ref 80.0–100.0)
Platelets: 420 10*3/uL — ABNORMAL HIGH (ref 150–400)
RBC: 2.82 MIL/uL — ABNORMAL LOW (ref 4.22–5.81)
RDW: 13.5 % (ref 11.5–15.5)
WBC: 9.8 10*3/uL (ref 4.0–10.5)
nRBC: 0 % (ref 0.0–0.2)

## 2019-11-02 NOTE — Progress Notes (Signed)
     Subjective:  68 year old male with history of A. Fib on Eliquis, AAS, diastolic CHF, morbid obesity, spinal stenosis, right knee replacement, and OSA presented to the ED on 12/20 with complaint of right leg pain with rash. He presented with fevers up to 100.8, leokocytosis at 17,000 and has been on IV antibiotics. He has noted significant improvement in pain and overall appearance of skin since presenting to the ED and starting IV antibiotics.   He was taken to the OR on 12/26 for an irrigation and debridement of his right leg. There was concern for abscess on his right medial thigh, however needle aspiration was performed on this area and no pus or fluid was pulled from the area.  Patient states pain is moderate today, but feels it is more due to moving from bed to chair than from leg wounds. No new leakage from dressings changed on 12/28. No new fatigue or lightheadedness.  Objective:   VITALS:   Vitals:   11/02/19 0813 11/02/19 1144 11/02/19 1333 11/02/19 1402  BP: 119/78 107/69 (!) 140/104 128/64  Pulse: 97 98 (!) 112 (!) 102  Resp:  20 17 16   Temp:  98.6 F (37 C)    TempSrc:  Oral    SpO2: 94% 95% 96% 92%  Weight:      Height:       General: Laying comfortably in chair, in no acute distress. Cardiovascular: continued right lower extremity pedal edema. Resp: no use of accessory musculature. GI: abdomen is soft and non-tender Skin: Dressings intact, no drainage noted.  Musculoskeletal: EHL and FHL of right lower extremity intact. Strength at ankle 3/5. Able to lift his right leg 2-3 inches off of chair. Sensation and capillary refill intact distally.    Lab Results  Component Value Date   WBC 9.8 11/02/2019   HGB 8.1 (L) 11/02/2019   HCT 25.9 (L) 11/02/2019   MCV 91.8 11/02/2019   PLT 420 (H) 11/02/2019   BMET    Component Value Date/Time   NA 137 11/02/2019 0458   NA 138 08/05/2019 1034   K 4.1 11/02/2019 0458   CL 98 11/02/2019 0458   CL 101 08/08/2015  0000   CO2 30 11/02/2019 0458   CO2 32 08/08/2015 0000   GLUCOSE 111 (H) 11/02/2019 0458   BUN 28 (H) 11/02/2019 0458   BUN 18 08/05/2019 1034   CREATININE 1.35 (H) 11/02/2019 0458   CREATININE 1.02 10/08/2016 1144   CALCIUM 8.4 (L) 11/02/2019 0458   CALCIUM 9.3 08/08/2015 0000   GFRNONAA 54 (L) 11/02/2019 0458   GFRNONAA 77 10/08/2016 1144   GFRAA >60 11/02/2019 0458   GFRAA 89 10/08/2016 1144     Assessment/Plan: 3 Days Post-Op   Principal Problem:   Necrotizing celluliitis of lower leg ([place]), right Active Problems:   Chronic congestive heart failure (HCC)   Longstanding persistent atrial fibrillation (HCC)  PLAN: - currently on Zyvox - continue current prn pain control regimen - plan to change both thigh and ankle dressing tomorrow morning - after evaluating both dressings, may consider restarting eliquis at that time.   Ventura Bruns 11/02/2019, 5:05 PM

## 2019-11-02 NOTE — Progress Notes (Signed)
Inpatient Rehabilitation Admissions Coordinator  Inpatient rehab consult received. I met with patient and his wife at bedside for rehab assessment. We discussed goals and expectations of an inpt rehab admit. Pt was up in chair for 3 hrs thi morning which was second time in chair in past 9 days. I await updated physical therapy assessment since last treatment 12/23 to assist in determining need for inpt rehab vs home with [facility]. I will follow up tomorrow for final dispo recommendation.  Danne Baxter, RN, MSN Rehab Admissions Coordinator [phone] 11/02/2019 11:48 AM

## 2019-11-02 NOTE — Progress Notes (Addendum)
Per CCMD, patient heart rate went to 148. Physical therapy currently at bedside, states she does want patient to stand. Patient denies chest pain, shortness of breath, or dizziness. Heart rate is currently 139 per heart monitor. Maylene Roes MD paged. Current vitals are as follows:   11/02/19 1333  Vitals  BP (!) 140/104  MAP (mmHg) 112  BP Location Left Arm  BP Method Automatic  Patient Position (if appropriate) Lying  Pulse Rate (!) 112  Resp 17  Oxygen Therapy  SpO2 96 %  O2 Device Room Air  MEWS Score  MEWS RR 0  MEWS Pulse 2  MEWS Systolic 0  MEWS LOC 0  MEWS Temp 0  MEWS Score 2  MEWS Score Color Yellow   Patient currently lying in bed, call bell within reach, spouse at bedside.

## 2019-11-02 NOTE — Progress Notes (Signed)
PROGRESS NOTE    Joel Dominguez  [id number] DOB: 05-29-1951 DOA: 10/24/2019 PCP: Mellody Dance, DO     Brief Narrative:  [name] is a  68 year old male with history of A. fib on Eliquis, AAS, diastolic CHF, moderate MR, morbid obesity with BMI 47, obese, OSA on CPAP presents with right leg leg necrotizing cellulitis.  On presentation he was febrile, leukocytosis 20 K underwent excisional debridement of the skin and subcutaneous tissue of the right lower extremity by Ortho 12/26.  Patient has been on antibiotics, wound culture results no growth so far.  Nasal swab with MRSA. He underwent excisional debridement 12/26.  New events last 24 hours / Subjective: Doing well overall.  Denies any complaints today.  Assessment & Plan:   Principal Problem:   Necrotizing celluliitis of lower leg ([place]), right Active Problems:   Chronic congestive heart failure (HCC)   Longstanding persistent atrial fibrillation (HCC)   Necrotizing celluliitis of right lower extremity with severe right leg cellulitis -S/p excisional debridement of the skin and subcutaneous tissue of the right lower extremity by Ortho 12/26 -Ortho and plastics following -Aerobic/anaerobic culture from the wound: Gram stain with rare WBC, wound culture no growth so far. MRSA nasal negative.   Blood cultures negative.  Cont Zyvox for now.  -WBC improving 9.8 -Continue pain control  Bleeding from the right lower extremity -Eliquis has been on hold -Continue tight dressing applied by ortho  Acute blood loss anemia due to above -Hgb 8.1.  Continue to monitor  AKI on CKD stage IIIa -Creatinine improved overnight 1.35  Cronic congestive heart failure/pulmonary hypertension -Stable  Essential hypertension/Hypotension  -Reduce Coreg due to hypotension   Persistent atrial fibrillation -Intermittently and RVR continue Coreg, continue as needed Lopressor.  Holding Eliquis.  CVA history without residual deficit  -Stable.  Unable to resume Eliquis due to bleeding.  OSA on CPAP  -Cont CPAP qhs   Morbid obesity -Estimated body mass index is 44.58 kg/m as calculated from the following:   Height as of this encounter: 6' (1.829 m).   Weight as of this encounter: 149.1 kg.    In agreement with assessment of the pressure ulcer as below:  Pressure Injury 10/28/19 Buttocks Right;Left;Mid Deep Tissue Pressure Injury - Purple or maroon localized area of discolored intact skin or blood-filled blister due to damage of underlying soft tissue from pressure and/or shear.  (Active)  10/28/19 0800  Location: Buttocks  Location Orientation: Right;Left;Mid  Staging: Deep Tissue Pressure Injury - Purple or maroon localized area of discolored intact skin or blood-filled blister due to damage of underlying soft tissue from pressure and/or shear.  Wound Description (Comments):   Present on Admission:          DVT prophylaxis: Liquids on hold due to blood loss anemia Code Status: Full code Family Communication: None at bedside Disposition Plan: CIR eval pending   Consultants:   Orthopedic surgery  Plastic surgery   Antimicrobials:  Anti-infectives (From admission, onward)   Start     Dose/Rate Route Frequency Ordered Stop   10/28/19 1800  linezolid (ZYVOX) tablet 600 mg     600 mg Oral Every 12 hours 10/28/19 1509     10/28/19 1515  linezolid (ZYVOX) tablet 600 mg  Status:  Discontinued     600 mg Oral Every 12 hours 10/28/19 1500 10/28/19 1501   10/28/19 1430  linezolid (ZYVOX) IVPB 600 mg  Status:  Discontinued     600 mg 300 mL/hr over 60 Minutes Intravenous  Every 12 hours 10/28/19 1425 10/28/19 1500   10/26/19 1200  ampicillin (OMNIPEN) 2 g in sodium chloride 0.9 % 100 mL IVPB  Status:  Discontinued     2 g 300 mL/hr over 20 Minutes Intravenous Every 4 hours 10/26/19 1009 10/28/19 1421   10/25/19 1500  vancomycin (VANCOREADY) IVPB 1500 mg/300 mL  Status:  Discontinued     1,500 mg 150  mL/hr over 120 Minutes Intravenous Every 24 hours 10/24/19 1354 10/26/19 1009   10/24/19 2200  aztreonam (AZACTAM) 2 g in sodium chloride 0.9 % 100 mL IVPB  Status:  Discontinued     2 g 200 mL/hr over 30 Minutes Intravenous Every 8 hours 10/24/19 1354 10/25/19 0956   10/24/19 1300  vancomycin (VANCOCIN) 2,500 mg in sodium chloride 0.9 % 500 mL IVPB     2,500 mg 250 mL/hr over 120 Minutes Intravenous  Once 10/24/19 1259 10/24/19 1642   10/24/19 1245  vancomycin (VANCOCIN) IVPB 1000 mg/200 mL premix  Status:  Discontinued     1,000 mg 200 mL/hr over 60 Minutes Intravenous  Once 10/24/19 1240 10/24/19 1259   10/24/19 1245  aztreonam (AZACTAM) 2 g in sodium chloride 0.9 % 100 mL IVPB     2 g 200 mL/hr over 30 Minutes Intravenous  Once 10/24/19 1240 10/24/19 1819        Objective: Vitals:   11/01/19 2014 11/02/19 0545 11/02/19 0813 11/02/19 1144  BP: 120/74 (!) 167/84 119/78 107/69  Pulse: 97 95 97 98  Resp: 20 20  20   Temp:  98 F (36.7 C)  98.6 F (37 C)  TempSrc:  Oral  Oral  SpO2: (!) 88% 97% 94% 95%  Weight:  (!) 149.1 kg    Height:        Intake/Output Summary (Last 24 hours) at 11/02/2019 1148 Last data filed at 11/02/2019 1143 Gross per 24 hour  Intake 1525 ml  Output 2525 ml  Net -1000 ml   Filed Weights   10/31/19 0623 11/01/19 0342 11/02/19 0545  Weight: (!) 160.1 kg (!) 158.8 kg (!) 149.1 kg    Examination:  General exam: Appears calm and comfortable  Respiratory system: Clear to auscultation. Respiratory effort normal. No respiratory distress. No conversational dyspnea.  Wearing nasal CPAP Cardiovascular system: S1 & S2 heard, irregular rhythm. No murmurs. No pedal edema. Gastrointestinal system: Abdomen is nondistended, soft and nontender. Normal bowel sounds heard. Central nervous system: Alert and oriented. No focal neurological deficits. Speech clear.  Extremities: Right lower extremity with clean and dry Ace bandaging Psychiatry: Judgement and insight  appear normal. Mood & affect appropriate.   Data Reviewed: I have personally reviewed following labs and imaging studies  CBC: Recent Labs  Lab 10/27/19 0413 10/29/19 0328 10/30/19 0708 10/31/19 0451 11/01/19 0343 11/02/19 0458  WBC 18.2* 19.3* 13.4* 15.5* 12.6* 9.8  NEUTROABS 14.5*  --   --   --   --   --   HGB 11.4* 10.9* 11.9* 10.2* 8.8* 8.1*  HCT 35.1* 33.9* 38.2* 32.0* 28.1* 25.9*  MCV 90.5 91.1 91.2 91.2 92.1 91.8  PLT 343 385 428* 399 464* 0000000*   Basic Metabolic Panel: Recent Labs  Lab 10/28/19 0422 10/29/19 0328 10/30/19 0708 10/31/19 0451 11/01/19 0343 11/02/19 0458  NA 139 138 140 136 136 137  K 4.1 3.9 3.9 4.6 5.0 4.1  CL 98 100 99 97* 97* 98  CO2 27 28 32 27 31 30   GLUCOSE 123* 126* 121* 151* 137* 111*  BUN  27* 26* 20 27* 33* 28*  CREATININE 1.23 1.20 1.26* 1.35* 1.62* 1.35*  CALCIUM 8.6* 8.6* 8.7* 8.3* 8.3* 8.4*  MG 2.2 1.9 2.0 2.2 2.3  --   PHOS  --   --   --   --  4.0  --    GFR: Estimated Creatinine Clearance: 78.7 mL/min (A) (by C-G formula based on SCr of 1.35 mg/dL (H)). Liver Function Tests: Recent Labs  Lab 11/01/19 0343  ALBUMIN 2.2*   No results for input(s): LIPASE, AMYLASE in the last 168 hours. No results for input(s): AMMONIA in the last 168 hours. Coagulation Profile: No results for input(s): INR, PROTIME in the last 168 hours. Cardiac Enzymes: No results for input(s): CKTOTAL, CKMB, CKMBINDEX, TROPONINI in the last 168 hours. BNP (last 3 results) No results for input(s): PROBNP in the last 8760 hours. HbA1C: No results for input(s): HGBA1C in the last 72 hours. CBG: Recent Labs  Lab 10/30/19 1417  GLUCAP 122*   Lipid Profile: No results for input(s): CHOL, HDL, LDLCALC, TRIG, CHOLHDL, LDLDIRECT in the last 72 hours. Thyroid Function Tests: No results for input(s): TSH, T4TOTAL, FREET4, T3FREE, THYROIDAB in the last 72 hours. Anemia Panel: No results for input(s): VITAMINB12, FOLATE, FERRITIN, TIBC, IRON, RETICCTPCT in  the last 72 hours. Sepsis Labs: No results for input(s): PROCALCITON, LATICACIDVEN in the last 168 hours.  Recent Results (from the past 240 hour(s))  Blood Culture (routine x 2)     Status: None   Collection Time: 10/24/19  1:00 PM   Specimen: BLOOD LEFT HAND  Result Value Ref Range Status   Specimen Description BLOOD LEFT HAND  Final   Special Requests   Final    BOTTLES DRAWN AEROBIC AND ANAEROBIC Blood Culture adequate volume   Culture   Final    NO GROWTH 5 DAYS Performed at [facility], [address] [place], [place] [address]    Report Status 10/29/2019 FINAL  Final  SARS CORONAVIRUS 2 (TAT 6-24 HRS) Nasopharyngeal Nasopharyngeal Swab     Status: None   Collection Time: 10/24/19  1:05 PM   Specimen: Nasopharyngeal Swab  Result Value Ref Range Status   SARS Coronavirus 2 NEGATIVE NEGATIVE Final    Comment: (NOTE) SARS-CoV-2 target nucleic acids are NOT DETECTED. The SARS-CoV-2 RNA is generally detectable in upper and lower respiratory specimens during the acute phase of infection. Negative results do not preclude SARS-CoV-2 infection, do not rule out co-infections with other pathogens, and should not be used as the sole basis for treatment or other patient management decisions. Negative results must be combined with clinical observations, patient history, and epidemiological information. The expected result is Negative. Fact Sheet for Patients: SugarRoll.be Fact Sheet for Healthcare Providers: https://www.woods-mathews.com/ This test is not yet approved or cleared by the [place] FDA and  has been authorized for detection and/or diagnosis of SARS-CoV-2 by FDA under an Emergency Use Authorization (EUA). This EUA will remain  in effect (meaning this test can be used) for the duration of the COVID-19 declaration under Section 56 4(b)(1) of the Act, 21 U.S.C. section 360bbb-3(b)(1), unless the authorization is terminated  or revoked sooner. Performed at [facility], [place] [address] [place], [place] [address]   Blood Culture (routine x 2)     Status: None   Collection Time: 10/24/19  2:45 PM   Specimen: BLOOD RIGHT HAND  Result Value Ref Range Status   Specimen Description BLOOD RIGHT HAND  Final   Special  Requests   Final    BOTTLES DRAWN AEROBIC AND ANAEROBIC Blood Culture adequate volume   Culture   Final    NO GROWTH 5 DAYS Performed at [facility], [place] [address], [place], [place] [address]    Report Status 10/29/2019 FINAL  Final  Urine culture     Status: None   Collection Time: 10/24/19  8:20 PM   Specimen: In/Out Cath Urine  Result Value Ref Range Status   Specimen Description IN/OUT CATH URINE  Final   Special Requests NONE  Final   Culture   Final    NO GROWTH Performed at [facility], [place] [address] [place], [place] [address]    Report Status 10/26/2019 FINAL  Final  Surgical pcr screen     Status: None   Collection Time: 10/30/19 11:55 AM   Specimen: Nasal Mucosa; Nasal Swab  Result Value Ref Range Status   MRSA, PCR NEGATIVE NEGATIVE Final   Staphylococcus aureus NEGATIVE NEGATIVE Final    Comment: (NOTE) The Xpert SA Assay (FDA approved for NASAL specimens in patients 81 years of age and older), is one component of a comprehensive surveillance program. It is not intended to diagnose infection nor to guide or monitor treatment. Performed at [facility], [place] [address], [place], [place] [address]   Aerobic/Anaerobic Culture (surgical/deep wound)     Status: None (Preliminary result)   Collection Time: 10/30/19  1:39 PM   Specimen: Soft Tissue, Other  Result Value Ref Range Status   Specimen Description TISSUE RIGHT LEG  Final   Special Requests NONE  Final   Gram Stain   Final    RARE WBC PRESENT,BOTH PMN AND MONONUCLEAR NO ORGANISMS SEEN    Culture   Final    NO GROWTH 3 DAYS NO ANAEROBES ISOLATED; CULTURE IN PROGRESS FOR 5 DAYS Performed  at [facility], [place] [address], [place],  [address]    Report Status PENDING  Incomplete      Radiology Studies: No results found.    Scheduled Meds: . bumetanide  2 mg Oral BID  . carvedilol  6.25 mg Oral BID WC  . docusate sodium  100 mg Oral BID  . linezolid  600 mg Oral Q12H  . senna  1 tablet Oral BID   Continuous Infusions: . sodium chloride 250 mL (10/26/19 1204)  . sodium chloride       LOS: 8 days      Time spent: 35 minutes   Dessa Phi, DO [facility] 11/02/2019, 11:48 AM   Available via Epic secure chat 7am-7pm After these hours, please refer to coverage provider listed on [email or web address]

## 2019-11-02 NOTE — Progress Notes (Signed)
Physical Therapy Treatment Patient Details Name: Joel Dominguez MRN: [id number] DOB: 03-Dec-1950 Today's Date: 11/02/2019    History of Present Illness 68 year old male with history of A. fib on Eliquis, AAS, diastolic CHF, moderate MR, morbid obesity and OSA presented with right leg pain and swelling with blisters, admitted for RLE cellulitis.    PT Comments    Pt received resting in bed with wife present. Pt reporting sitting up in recliner for 4.5 hours this morning. Pt reporting 4/10 pain. Pt performed supine LE therex with min cuing for correct performance. Pt required min A from therapist or use of strap for performing RLE therex. Pt required min guard - min A for getting EOB and returning supine. Pt mod I for scooting in bed and rolling. Upon sitting EOB pt HR fluctuating and increasing to 140-145. Nursing notified and present. Pt denying any chest pain,dizziness, lightheadedness or SOB. Further OOB mobility deferred secondary to increased HR. Nursing in agreement. Pt tolerated sitting EOB for ~3 min with min guard assist for safety and performing LE therex. Pt presents with decreased strength, ROM, balance and activity tolerance limiting functional mobility. Pt is progressing towards PT goals. Pt will benefit from continued acute PT to improve deficits. CIR following discharge appropriate in order to best maximize return to PLOF and independence with functional mobility.    Follow Up Recommendations  CIR;Supervision/Assistance - 24 hour     Equipment Recommendations  Rolling walker with 5" wheels    Recommendations for Other Services       Precautions / Restrictions Precautions Precautions: Fall Restrictions Weight Bearing Restrictions: No    Mobility  Bed Mobility Overal bed mobility: Needs Assistance Bed Mobility: Rolling;Supine to Sit;Sit to Supine Rolling: Modified independent (Device/Increase time)   Supine to sit: Min guard;HOB elevated Sit to supine: Min assist;HOB  elevated   General bed mobility comments: mod I for rolling for pad mgt with use of bed rails, min guard to get EOB, min A for LE advancement for returning to supine, pt able to scoot up in bed with use of bed rails  Transfers                 General transfer comment: transfers deferred secondary to elevated HR  Ambulation/Gait             General Gait Details: deferred, elevated HR   Stairs             Wheelchair Mobility    Modified Rankin (Stroke Patients Only)       Balance Overall balance assessment: Needs assistance Sitting-balance support: Feet supported Sitting balance-Leahy Scale: Fair Sitting balance - Comments: steady sitting EOB                                    Cognition Arousal/Alertness: Awake/alert Behavior During Therapy: WFL for tasks assessed/performed Overall Cognitive Status: Within Functional Limits for tasks assessed                                        Exercises Total Joint Exercises Ankle Circles/Pumps: AROM;Both;10 reps Short Arc Quad: AROM;Both;10 reps Heel Slides: AROM;Left;10 reps;AAROM;Right Hip ABduction/ADduction: AROM;Left;10 reps;AAROM;Right Straight Leg Raises: AROM;Left;10 reps;AAROM;Right Long Arc Quad: AROM;Both;10 reps Marching in Standing: AROM;Left;10 reps;AAROM;Right    General Comments        Pertinent Vitals/Pain Faces  Pain Scale: Hurts little more Pain Location: buttocks, and RLE Pain Descriptors / Indicators: Aching;Sore Pain Intervention(s): Limited activity within patient's tolerance;Monitored during session;Repositioned    Home Living                      Prior Function            PT Goals (current goals can now be found in the care plan section) Progress towards PT goals: Progressing toward goals    Frequency    Min 3X/week      PT Plan Current plan remains appropriate    Co-evaluation              AM-PAC PT "6 Clicks"  Mobility   Outcome Measure  Help needed turning from your back to your side while in a flat bed without using bedrails?: A Little Help needed moving from lying on your back to sitting on the side of a flat bed without using bedrails?: A Lot Help needed moving to and from a bed to a chair (including a wheelchair)?: A Lot Help needed standing up from a chair using your arms (e.g., wheelchair or bedside chair)?: A Lot Help needed to walk in hospital room?: A Lot Help needed climbing 3-5 steps with a railing? : Total 6 Click Score: 12    End of Session   Activity Tolerance: Patient tolerated treatment well;Other (comment)(elevated HR) Patient left: in bed;with call bell/phone within reach;with family/visitor present;with nursing/sitter in room Nurse Communication: Mobility status PT Visit Diagnosis: Muscle weakness (generalized) (M62.81)     Time: [id number] PT Time Calculation (min) (ACUTE ONLY): 23 min  Charges:  $Therapeutic Exercise: 8-22 mins                     Sharnette Kitamura PT, DPT 2:01 PM,11/02/19    Yasmeen Manka [place]illa Chalet 11/02/2019, 1:56 PM

## 2019-11-02 NOTE — Progress Notes (Signed)
   Vital Signs MEWS/VS Documentation      11/02/2019 1144 11/02/2019 1313 11/02/2019 1333 11/02/2019 1402   MEWS Score:  0  3  2  1    MEWS Score Color:  Green  Yellow  Yellow  Green   Resp:  20  --  17  16   Pulse:  98  --  (!) 112  (!) 102   BP:  107/69  --  (!) 140/104  128/64   Temp:  98.6 F (37 C)  --  --  --   O2 Device:  Room [employer]  --  Room [employer] 11/02/2019,2:03 PM

## 2019-11-02 NOTE — Progress Notes (Signed)
pts heart rate got up to 144 non sustained, pt was using urinal at this time ,  Pt is asymptomatic  Current heart rate 88  Will continue to monitor

## 2019-11-02 NOTE — Plan of Care (Signed)
  Problem: Education: Goal: Ability to demonstrate management of disease process will improve Outcome: Progressing   Problem: Activity: Goal: Capacity to carry out activities will improve Outcome: Progressing   Problem: Cardiac: Goal: Ability to achieve and maintain adequate cardiopulmonary perfusion will improve Outcome: Progressing   Problem: Pain Managment: Goal: General experience of comfort will improve Outcome: Progressing   Problem: Safety: Goal: Ability to remain free from injury will improve Outcome: Progressing   Problem: Skin Integrity: Goal: Risk for impaired skin integrity will decrease Outcome: Progressing

## 2019-11-03 LAB — BASIC METABOLIC PANEL
Anion gap: 10 (ref 5–15)
BUN: 23 mg/dL (ref 8–23)
CO2: 31 mmol/L (ref 22–32)
Calcium: 9 mg/dL (ref 8.9–10.3)
Chloride: 96 mmol/L — ABNORMAL LOW (ref 98–111)
Creatinine, Ser: 1.27 mg/dL — ABNORMAL HIGH (ref 0.61–1.24)
GFR calc Af Amer: >60 mL/min (ref >60)
GFR calc non Af Amer: 58 mL/min — ABNORMAL LOW (ref >60)
Glucose, Bld: 126 mg/dL — ABNORMAL HIGH (ref 70–99)
Potassium: 4.4 mmol/L (ref 3.5–5.1)
Sodium: 137 mmol/L (ref 135–145)

## 2019-11-03 LAB — CBC
HCT: 27.5 % — ABNORMAL LOW (ref 39.0–52.0)
Hemoglobin: 8.6 g/dL — ABNORMAL LOW (ref 13.0–17.0)
MCH: 28.1 pg (ref 26.0–34.0)
MCHC: 31.3 g/dL (ref 30.0–36.0)
MCV: 89.9 fL (ref 80.0–100.0)
Platelets: 435 10*3/uL — ABNORMAL HIGH (ref 150–400)
RBC: 3.06 MIL/uL — ABNORMAL LOW (ref 4.22–5.81)
RDW: 13.6 % (ref 11.5–15.5)
WBC: 9.5 10*3/uL (ref 4.0–10.5)
nRBC: 0 % (ref 0.0–0.2)

## 2019-11-03 MED ORDER — CARVEDILOL 6.25 MG PO TABS
6.2500 mg | ORAL_TABLET | Freq: Once | ORAL | Status: AC
  Administered 2019-11-03: 10:00:00 6.25 mg via ORAL
  Filled 2019-11-03: qty 1

## 2019-11-03 MED ORDER — CARVEDILOL 25 MG PO TABS
25.0000 mg | ORAL_TABLET | Freq: Two times a day (BID) | ORAL | Status: DC
  Administered 2019-11-03 – 2019-11-09 (×12): 25 mg via ORAL
  Filled 2019-11-03 (×12): qty 1

## 2019-11-03 NOTE — Progress Notes (Signed)
Patient had 16 beats of VTach per telemetry. Checked on patient and he is asymptomatic and denies chest pain. Will continue to monitor patient.

## 2019-11-03 NOTE — Progress Notes (Signed)
Subjective: Joel Dominguez is 4 days postop after debridement of right leg wounds with Dr. Mardelle Matte.  He reports he is doing well today, pain is well controlled.  The majority of his pain is associated with dressing changes.  No fevers, chills, nausea, vomiting.  Ortho team changed dressings today and placed photos in EMR.  Objective: Vital signs in last 24 hours: Temp:  [97.7 F (36.5 C)-98.6 F (37 C)] 98.6 F (37 C) (12/30 1119) Pulse Rate:  [80-104] 85 (12/30 1119) Resp:  [17-19] 17 (12/30 1119) BP: (102-164)/(64-79) 102/64 (12/30 1119) SpO2:  [93 %-99 %] 93 % (12/30 1119) Weight:  [149.2 kg] 149.2 kg (12/30 0414) Last BM Date: 10/31/19  Intake/Output from previous day: 12/29 0701 - 12/30 0700 In: 86 [P.O.:970] Out: 2900 [Urine:2900] Intake/Output this shift: Total I/O In: 660 [P.O.:660] Out: 550 [Urine:550]  General appearance: alert, cooperative, no distress and Lying in bed Resp: Unlabored Extremities: Right leg with dressings in place, C/D.  Dry skin noted on distal aspect of foot, good color of toes.  Left leg without edema. Pulses: 2+ and symmetric  Lab Results:  CBC Latest Ref Rng & Units 11/03/2019 11/02/2019 11/01/2019  WBC 4.0 - 10.5 K/uL 9.5 9.8 12.6(H)  Hemoglobin 13.0 - 17.0 g/dL 8.6(L) 8.1(L) 8.8(L)  Hematocrit 39.0 - 52.0 % 27.5(L) 25.9(L) 28.1(L)  Platelets 150 - 400 K/uL 435(H) 420(H) 464(H)    BMET Recent Labs    11/02/19 0458 11/03/19 0622  NA 137 137  K 4.1 4.4  CL 98 96*  CO2 30 31  GLUCOSE 111* 126*  BUN 28* 23  CREATININE 1.35* 1.27*  CALCIUM 8.4* 9.0   PT/INR No results for input(s): LABPROT, INR in the last 72 hours. ABG No results for input(s): PHART, HCO3 in the last 72 hours.  Invalid input(s): PCO2, PO2  Studies/Results: No results found.  Anti-infectives: Anti-infectives (From admission, onward)   Start     Dose/Rate Route Frequency Ordered Stop   10/28/19 1800  linezolid (ZYVOX) tablet 600 mg     600 mg Oral Every 12  hours 10/28/19 1509     10/28/19 1515  linezolid (ZYVOX) tablet 600 mg  Status:  Discontinued     600 mg Oral Every 12 hours 10/28/19 1500 10/28/19 1501   10/28/19 1430  linezolid (ZYVOX) IVPB 600 mg  Status:  Discontinued     600 mg 300 mL/hr over 60 Minutes Intravenous Every 12 hours 10/28/19 1425 10/28/19 1500   10/26/19 1200  ampicillin (OMNIPEN) 2 g in sodium chloride 0.9 % 100 mL IVPB  Status:  Discontinued     2 g 300 mL/hr over 20 Minutes Intravenous Every 4 hours 10/26/19 1009 10/28/19 1421   10/25/19 1500  vancomycin (VANCOREADY) IVPB 1500 mg/300 mL  Status:  Discontinued     1,500 mg 150 mL/hr over 120 Minutes Intravenous Every 24 hours 10/24/19 1354 10/26/19 1009   10/24/19 2200  aztreonam (AZACTAM) 2 g in sodium chloride 0.9 % 100 mL IVPB  Status:  Discontinued     2 g 200 mL/hr over 30 Minutes Intravenous Every 8 hours 10/24/19 1354 10/25/19 0956   10/24/19 1300  vancomycin (VANCOCIN) 2,500 mg in sodium chloride 0.9 % 500 mL IVPB     2,500 mg 250 mL/hr over 120 Minutes Intravenous  Once 10/24/19 1259 10/24/19 1642   10/24/19 1245  vancomycin (VANCOCIN) IVPB 1000 mg/200 mL premix  Status:  Discontinued     1,000 mg 200 mL/hr over 60 Minutes Intravenous  Once 10/24/19 1240 10/24/19 1259   10/24/19 1245  aztreonam (AZACTAM) 2 g in sodium chloride 0.9 % 100 mL IVPB     2 g 200 mL/hr over 30 Minutes Intravenous  Once 10/24/19 1240 10/24/19 1819      Assessment/Plan:  Joel Dominguez is doing well, pain is better controlled.  Photos reviewed in EMR, patient would benefit from irrigation/debridement and placement of wound matrix.  We will plan this for next week.   Continue with daily dressing changes per Ortho recommendations.  Patient is currently off of his Eliquis due to bleeding from his right thigh wound. Patient would benefit from holding Eliquis prior to and for minimum 24 hours postop, defer final decision to primary team on bridging with Lovenox or heparin.    Spoke  with Joel Dominguez and his wife, answered all their questions.    LOS: 9 days    Charlies Constable, PA-C 11/03/2019

## 2019-11-03 NOTE — Progress Notes (Signed)
PROGRESS NOTE    Joel Dominguez  [id number] DOB: 1951-01-05 DOA: 10/24/2019 PCP: Mellody Dance, DO     Brief Narrative:  Stavro Alfredo is a  68 year old male with history of A. fib on Eliquis, AAS, diastolic CHF, moderate MR, morbid obesity with BMI 47, obese, OSA on CPAP presents with right leg leg necrotizing cellulitis.  On presentation he was febrile, leukocytosis 20 K underwent excisional debridement of the skin and subcutaneous tissue of the right lower extremity by Ortho 12/26.  Patient has been on antibiotics, wound culture results no growth so far.  Nasal swab with MRSA. He underwent excisional debridement 12/26.  New events last 24 hours / Subjective: Heart rate has been increasing intermittently with minimal activity.  He has no complaints today.  Denies any chest pain, shortness of breath.  Pain in his right lower extremity is stable.  Tolerating meals without nausea or vomiting.  Assessment & Plan:   Principal Problem:   Necrotizing celluliitis of lower leg ([place]), right Active Problems:   Chronic congestive heart failure (HCC)   Longstanding persistent atrial fibrillation (HCC)   Necrotizing celluliitis of right lower extremity with severe right leg cellulitis -S/p excisional debridement of the skin and subcutaneous tissue of the right lower extremity by Ortho 12/26 -Ortho and plastics following -Aerobic/anaerobic culture from the wound: Gram stain with rare WBC, wound culture no growth so far. MRSA nasal negative.   Blood cultures negative.  Cont Zyvox for now.  -Continue pain control -Leukocytosis resolved  Bleeding from the right lower extremity -Eliquis has been on hold -Continue tight dressing applied by ortho   Acute blood loss anemia due to above -Hgb 8.6.  Continue to monitor  AKI on CKD stage IIIa -Creatinine improved overnight 1.27  Cronic congestive heart failure/pulmonary hypertension -Stable  Essential hypertension -Resume home dose of  coreg due to tachycardia. BP stable now    Persistent atrial fibrillation -Intermittently and RVR. Continue Coreg, continue as needed Lopressor.  Holding Eliquis.   CVA history without residual deficit -Stable.    Holding Eliquis due to bleeding.  OSA on CPAP  -Cont CPAP qhs   Morbid obesity -Estimated body mass index is 44.62 kg/m as calculated from the following:   Height as of this encounter: 6' (1.829 m).   Weight as of this encounter: 149.2 kg.    In agreement with assessment of the pressure ulcer as below:  Pressure Injury 10/28/19 Buttocks Right;Left;Mid Deep Tissue Pressure Injury - Purple or maroon localized area of discolored intact skin or blood-filled blister due to damage of underlying soft tissue from pressure and/or shear.  (Active)  10/28/19 0800  Location: Buttocks  Location Orientation: Right;Left;Mid  Staging: Deep Tissue Pressure Injury - Purple or maroon localized area of discolored intact skin or blood-filled blister due to damage of underlying soft tissue from pressure and/or shear.  Wound Description (Comments):   Present on Admission:          DVT prophylaxis: Eliquis on hold due to blood loss anemia Code Status: Full code Family Communication: None at bedside Disposition Plan: CIR eval pending   Consultants:   Orthopedic surgery  Plastic surgery   Antimicrobials:  Anti-infectives (From admission, onward)   Start     Dose/Rate Route Frequency Ordered Stop   10/28/19 1800  linezolid (ZYVOX) tablet 600 mg     600 mg Oral Every 12 hours 10/28/19 1509     10/28/19 1515  linezolid (ZYVOX) tablet 600 mg  Status:  Discontinued  600 mg Oral Every 12 hours 10/28/19 1500 10/28/19 1501   10/28/19 1430  linezolid (ZYVOX) IVPB 600 mg  Status:  Discontinued     600 mg 300 mL/hr over 60 Minutes Intravenous Every 12 hours 10/28/19 1425 10/28/19 1500   10/26/19 1200  ampicillin (OMNIPEN) 2 g in sodium chloride 0.9 % 100 mL IVPB  Status:   Discontinued     2 g 300 mL/hr over 20 Minutes Intravenous Every 4 hours 10/26/19 1009 10/28/19 1421   10/25/19 1500  vancomycin (VANCOREADY) IVPB 1500 mg/300 mL  Status:  Discontinued     1,500 mg 150 mL/hr over 120 Minutes Intravenous Every 24 hours 10/24/19 1354 10/26/19 1009   10/24/19 2200  aztreonam (AZACTAM) 2 g in sodium chloride 0.9 % 100 mL IVPB  Status:  Discontinued     2 g 200 mL/hr over 30 Minutes Intravenous Every 8 hours 10/24/19 1354 10/25/19 0956   10/24/19 1300  vancomycin (VANCOCIN) 2,500 mg in sodium chloride 0.9 % 500 mL IVPB     2,500 mg 250 mL/hr over 120 Minutes Intravenous  Once 10/24/19 1259 10/24/19 1642   10/24/19 1245  vancomycin (VANCOCIN) IVPB 1000 mg/200 mL premix  Status:  Discontinued     1,000 mg 200 mL/hr over 60 Minutes Intravenous  Once 10/24/19 1240 10/24/19 1259   10/24/19 1245  aztreonam (AZACTAM) 2 g in sodium chloride 0.9 % 100 mL IVPB     2 g 200 mL/hr over 30 Minutes Intravenous  Once 10/24/19 1240 10/24/19 1819       Objective: Vitals:   11/02/19 1737 11/02/19 2018 11/03/19 0414 11/03/19 0739  BP: 131/71 129/79 (!) 152/74 (!) 164/67  Pulse: 87 90 80 87  Resp:  19 19 17   Temp:  98.3 F (36.8 C) 97.7 F (36.5 C)   TempSrc:  Oral Oral   SpO2:  99% 95% 96%  Weight:   (!) 149.2 kg   Height:        Intake/Output Summary (Last 24 hours) at 11/03/2019 0929 Last data filed at 11/03/2019 0839 Gross per 24 hour  Intake 970 ml  Output 3000 ml  Net -2030 ml   Filed Weights   11/01/19 0342 11/02/19 0545 11/03/19 0414  Weight: (!) 158.8 kg (!) 149.1 kg (!) 149.2 kg    Examination: General exam: Appears calm and comfortable  Respiratory system: Clear to auscultation. Respiratory effort normal. Cardiovascular system: S1 & S2 heard, irregular rhythm, tachycardic. No pedal edema. Gastrointestinal system: Abdomen is nondistended, soft and nontender. Normal bowel sounds heard. Central nervous system: Alert and oriented. Non focal exam.  Speech clear  Extremities: Right lower extremity with dry and clean Ace bandaging in place Psychiatry: Judgement and insight appear stable. Mood & affect appropriate.    Data Reviewed: I have personally reviewed following labs and imaging studies  CBC: Recent Labs  Lab 10/30/19 0708 10/31/19 0451 11/01/19 0343 11/02/19 0458 11/03/19 0622  WBC 13.4* 15.5* 12.6* 9.8 9.5  HGB 11.9* 10.2* 8.8* 8.1* 8.6*  HCT 38.2* 32.0* 28.1* 25.9* 27.5*  MCV 91.2 91.2 92.1 91.8 89.9  PLT 428* 399 464* 420* [id number]*   Basic Metabolic Panel: Recent Labs  Lab 10/28/19 0422 10/29/19 0328 10/30/19 0708 10/31/19 0451 11/01/19 0343 11/02/19 0458 11/03/19 0622  NA 139 138 140 136 136 137 137  K 4.1 3.9 3.9 4.6 5.0 4.1 4.4  CL 98 100 99 97* 97* 98 96*  CO2 27 28 32 27 31 30 31   GLUCOSE 123* 126*  121* 151* 137* 111* 126*  BUN 27* 26* 20 27* 33* 28* 23  CREATININE 1.23 1.20 1.26* 1.35* 1.62* 1.35* 1.27*  CALCIUM 8.6* 8.6* 8.7* 8.3* 8.3* 8.4* 9.0  MG 2.2 1.9 2.0 2.2 2.3  --   --   PHOS  --   --   --   --  4.0  --   --    GFR: Estimated Creatinine Clearance: 83.6 mL/min (A) (by C-G formula based on SCr of 1.27 mg/dL (H)). Liver Function Tests: Recent Labs  Lab 11/01/19 0343  ALBUMIN 2.2*   No results for input(s): LIPASE, AMYLASE in the last 168 hours. No results for input(s): AMMONIA in the last 168 hours. Coagulation Profile: No results for input(s): INR, PROTIME in the last 168 hours. Cardiac Enzymes: No results for input(s): CKTOTAL, CKMB, CKMBINDEX, TROPONINI in the last 168 hours. BNP (last 3 results) No results for input(s): PROBNP in the last 8760 hours. HbA1C: No results for input(s): HGBA1C in the last 72 hours. CBG: Recent Labs  Lab 10/30/19 1417  GLUCAP 122*   Lipid Profile: No results for input(s): CHOL, HDL, LDLCALC, TRIG, CHOLHDL, LDLDIRECT in the last 72 hours. Thyroid Function Tests: No results for input(s): TSH, T4TOTAL, FREET4, T3FREE, THYROIDAB in the last 72  hours. Anemia Panel: No results for input(s): VITAMINB12, FOLATE, FERRITIN, TIBC, IRON, RETICCTPCT in the last 72 hours. Sepsis Labs: No results for input(s): PROCALCITON, LATICACIDVEN in the last 168 hours.  Recent Results (from the past 240 hour(s))  Blood Culture (routine x 2)     Status: None   Collection Time: 10/24/19  1:00 PM   Specimen: BLOOD LEFT HAND  Result Value Ref Range Status   Specimen Description BLOOD LEFT HAND  Final   Special Requests   Final    BOTTLES DRAWN AEROBIC AND ANAEROBIC Blood Culture adequate volume   Culture   Final    NO GROWTH 5 DAYS Performed at [facility], [address] [place], [place] [address]    Report Status 10/29/2019 FINAL  Final  SARS CORONAVIRUS 2 (TAT 6-24 HRS) Nasopharyngeal Nasopharyngeal Swab     Status: None   Collection Time: 10/24/19  1:05 PM   Specimen: Nasopharyngeal Swab  Result Value Ref Range Status   SARS Coronavirus 2 NEGATIVE NEGATIVE Final    Comment: (NOTE) SARS-CoV-2 target nucleic acids are NOT DETECTED. The SARS-CoV-2 RNA is generally detectable in upper and lower respiratory specimens during the acute phase of infection. Negative results do not preclude SARS-CoV-2 infection, do not rule out co-infections with other pathogens, and should not be used as the sole basis for treatment or other patient management decisions. Negative results must be combined with clinical observations, patient history, and epidemiological information. The expected result is Negative. Fact Sheet for Patients: SugarRoll.be Fact Sheet for Healthcare Providers: https://www.woods-mathews.com/ This test is not yet approved or cleared by the [place] FDA and  has been authorized for detection and/or diagnosis of SARS-CoV-2 by FDA under an Emergency Use Authorization (EUA). This EUA will remain  in effect (meaning this test can be used) for the duration of the COVID-19 declaration under  Section 56 4(b)(1) of the Act, 21 U.S.C. section 360bbb-3(b)(1), unless the authorization is terminated or revoked sooner. Performed at [facility], [place] [address] [place],  [address]   Blood Culture (routine x 2)     Status: None   Collection Time: 10/24/19  2:45 PM   Specimen: BLOOD RIGHT HAND  Result  Value Ref Range Status   Specimen Description BLOOD RIGHT HAND  Final   Special Requests   Final    BOTTLES DRAWN AEROBIC AND ANAEROBIC Blood Culture adequate volume   Culture   Final    NO GROWTH 5 DAYS Performed at [facility], [address], [place], [place] [address]    Report Status 10/29/2019 FINAL  Final  Urine culture     Status: None   Collection Time: 10/24/19  8:20 PM   Specimen: In/Out Cath Urine  Result Value Ref Range Status   Specimen Description IN/OUT CATH URINE  Final   Special Requests NONE  Final   Culture   Final    NO GROWTH Performed at [facility], [place] [address] [place], [place] [address]    Report Status 10/26/2019 FINAL  Final  Surgical pcr screen     Status: None   Collection Time: 10/30/19 11:55 AM   Specimen: Nasal Mucosa; Nasal Swab  Result Value Ref Range Status   MRSA, PCR NEGATIVE NEGATIVE Final   Staphylococcus aureus NEGATIVE NEGATIVE Final    Comment: (NOTE) The Xpert SA Assay (FDA approved for NASAL specimens in patients 46 years of age and older), is one component of a comprehensive surveillance program. It is not intended to diagnose infection nor to guide or monitor treatment. Performed at [facility], [place] [address], [place], [place] [address]   Aerobic/Anaerobic Culture (surgical/deep wound)     Status: None (Preliminary result)   Collection Time: 10/30/19  1:39 PM   Specimen: Soft Tissue, Other  Result Value Ref Range Status   Specimen Description TISSUE RIGHT LEG  Final   Special Requests NONE  Final   Gram Stain   Final    RARE WBC PRESENT,BOTH PMN AND MONONUCLEAR NO ORGANISMS SEEN      Culture   Final    NO GROWTH 3 DAYS NO ANAEROBES ISOLATED; CULTURE IN PROGRESS FOR 5 DAYS Performed at [facility], [place] [address], [place], [place] [address]    Report Status PENDING  Incomplete      Radiology Studies: No results found.    Scheduled Meds: . bumetanide  2 mg Oral BID  . carvedilol  25 mg Oral BID WC  . carvedilol  6.25 mg Oral Once  . docusate sodium  100 mg Oral BID  . linezolid  600 mg Oral Q12H  . senna  1 tablet Oral BID   Continuous Infusions: . sodium chloride 250 mL (10/26/19 1204)  . sodium chloride       LOS: 9 days      Time spent: 25 minutes   Dessa Phi, DO [facility] 11/03/2019, 9:29 AM   Available via Epic secure chat 7am-7pm After these hours, please refer to coverage provider listed on [email or web address]

## 2019-11-03 NOTE — Progress Notes (Signed)
Per CCMD, patient's heart rate trending up to 140's. Current vitals are as follows:    11/03/19 0739  Vitals  BP (!) 164/67  MAP (mmHg) 88  BP Method Automatic  Pulse Rate 87  Pulse Rate Source Monitor  Resp 17  Oxygen Therapy  SpO2 96 %  O2 Device Room Air  MEWS Score  MEWS RR 0  MEWS Pulse 0  MEWS Systolic 0  MEWS LOC 0  MEWS Temp 0  MEWS Score 0  MEWS Score Color Green   Patient states he was "sliding up in the bed to eat breakfast". He denies chest pain, shortness of breath or dizziness. Coreg 6.25 mg tablet due for 08:00. Maylene Roes, DO notified.

## 2019-11-03 NOTE — Progress Notes (Signed)
Patient placed himself on home CPAP for the night.  

## 2019-11-03 NOTE — Progress Notes (Signed)
     Subjective:  68 year old male with history of A. Fib on Eliquis, AAS, diastolic CHF, morbid obesity, spinal stenosis, right knee replacement, and OSA presented to the ED on 12/20 with complaint of right leg pain with rash. He presented with fevers up to 100.8, leokocytosis at 17,000 and has been on IV antibiotics. He has noted significant improvement in pain and overall appearance of skin since presenting to the ED and starting IV antibiotics.   He was taken to the OR on 12/26 for an irrigation and debridement of his right leg. There was concern for abscess on his right medial thigh, however needle aspiration was performed on this area and no pus or fluid was pulled from the area.  Patient states pain is mild and he has not needed pain control in over 12 hours. No new drainage from wounds. He was able to get up with physical therapy and move from bed to chair. No new fatigue or lightheadedness. Objective:   VITALS:   Vitals:   11/02/19 1737 11/02/19 2018 11/03/19 0414 11/03/19 0739  BP: 131/71 129/79 (!) 152/74 (!) 164/67  Pulse: 87 90 80 87  Resp:  19 19 17   Temp:  98.3 F (36.8 C) 97.7 F (36.5 C)   TempSrc:  Oral Oral   SpO2:  99% 95% 96%  Weight:   (!) 149.2 kg   Height:       General:Laying comfortably in bed, in no acute distress. Resp: no use of accessory musculature. GI: abdomen is soft and non-tender Skin:please see pictures below Musculoskeletal:EHL and FHL of right lower extremity intact. Strength at ankle 3/5.Able to lift his right leg 2-3 inches off of chair.Sensation and capillary refill intact distally.                   Lab Results  Component Value Date   WBC 9.5 11/03/2019   HGB 8.6 (L) 11/03/2019   HCT 27.5 (L) 11/03/2019   MCV 89.9 11/03/2019   PLT 435 (H) 11/03/2019   BMET    Component Value Date/Time   NA 137 11/03/2019 0622   NA 138 08/05/2019 1034   K 4.4 11/03/2019 0622   CL 96 (L) 11/03/2019 0622   CL 101 08/08/2015  0000   CO2 31 11/03/2019 0622   CO2 32 08/08/2015 0000   GLUCOSE 126 (H) 11/03/2019 0622   BUN 23 11/03/2019 0622   BUN 18 08/05/2019 1034   CREATININE 1.27 (H) 11/03/2019 0622   CREATININE 1.02 10/08/2016 1144   CALCIUM 9.0 11/03/2019 0622   CALCIUM 9.3 08/08/2015 0000   GFRNONAA 58 (L) 11/03/2019 0622   GFRNONAA 77 10/08/2016 1144   GFRAA >60 11/03/2019 0622   GFRAA 89 10/08/2016 1144     Assessment/Plan: 4 Days Post-Op   Principal Problem:   Necrotizing celluliitis of lower leg ([place]), right Active Problems:   Chronic congestive heart failure (HCC)   Longstanding persistent atrial fibrillation (HCC) Necrotizing cellulitis of lower leg - both bandages were removed and placed with clean bandages. Pictures are above. Significant improvement since debridement.  - will continue applying clean bandages to the area once daily - continue antibiotics - continue physical therapy   Ventura Bruns 11/03/2019, 8:20 AM

## 2019-11-04 LAB — BASIC METABOLIC PANEL
Anion gap: 9 (ref 5–15)
BUN: 23 mg/dL (ref 8–23)
CO2: 30 mmol/L (ref 22–32)
Calcium: 8.8 mg/dL — ABNORMAL LOW (ref 8.9–10.3)
Chloride: 97 mmol/L — ABNORMAL LOW (ref 98–111)
Creatinine, Ser: 1.31 mg/dL — ABNORMAL HIGH (ref 0.61–1.24)
GFR calc Af Amer: >60 mL/min (ref >60)
GFR calc non Af Amer: 56 mL/min — ABNORMAL LOW (ref >60)
Glucose, Bld: 127 mg/dL — ABNORMAL HIGH (ref 70–99)
Potassium: 4.6 mmol/L (ref 3.5–5.1)
Sodium: 136 mmol/L (ref 135–145)

## 2019-11-04 LAB — CBC
HCT: 26.2 % — ABNORMAL LOW (ref 39.0–52.0)
Hemoglobin: 8.4 g/dL — ABNORMAL LOW (ref 13.0–17.0)
MCH: 29.2 pg (ref 26.0–34.0)
MCHC: 32.1 g/dL (ref 30.0–36.0)
MCV: 91 fL (ref 80.0–100.0)
Platelets: 454 10*3/uL — ABNORMAL HIGH (ref 150–400)
RBC: 2.88 MIL/uL — ABNORMAL LOW (ref 4.22–5.81)
RDW: 14 % (ref 11.5–15.5)
WBC: 10.2 10*3/uL (ref 4.0–10.5)
nRBC: 0 % (ref 0.0–0.2)

## 2019-11-04 LAB — AEROBIC/ANAEROBIC CULTURE W GRAM STAIN (SURGICAL/DEEP WOUND): Culture: NO GROWTH

## 2019-11-04 LAB — CK: Total CK: 42 U/L — ABNORMAL LOW (ref 49–397)

## 2019-11-04 NOTE — Progress Notes (Signed)
     Subjective:  S/p irrigation and debridement of right leg on 12/26.   Patient is laying comfortably in bed. He reports good pain control. No new drainage from wound. No fever, chills, abdominal pain, fatigue, or lightheadedness.    Objective:   VITALS:   Vitals:   11/03/19 1008 11/03/19 1119 11/03/19 1738 11/03/19 2119  BP: 124/78 102/64 120/65 (!) 119/49  Pulse: (!) 104 85 98 88  Resp:  17 14 16   Temp:  98.6 F (37 C)  98.1 F (36.7 C)  TempSrc:  Oral  Oral  SpO2:  93% 90% 94%  Weight:      Height:       General:Laying comfortably in bed, in no acute distress. Resp: no use of accessory musculature. GI: abdomen is soft and non-tender Skin:no changes since yesterday's wound change Musculoskeletal:EHL and FHL of right lower extremity intact.Able to lift his right leg 2-3 inches off ofchair.Sensation and capillary refill intact distally.  Lab Results  Component Value Date   WBC 10.2 11/04/2019   HGB 8.4 (L) 11/04/2019   HCT 26.2 (L) 11/04/2019   MCV 91.0 11/04/2019   PLT 454 (H) 11/04/2019   BMET    Component Value Date/Time   NA 136 11/04/2019 0451   NA 138 08/05/2019 1034   K 4.6 11/04/2019 0451   CL 97 (L) 11/04/2019 0451   CL 101 08/08/2015 0000   CO2 30 11/04/2019 0451   CO2 32 08/08/2015 0000   GLUCOSE 127 (H) 11/04/2019 0451   BUN 23 11/04/2019 0451   BUN 18 08/05/2019 1034   CREATININE 1.31 (H) 11/04/2019 0451   CREATININE 1.02 10/08/2016 1144   CALCIUM 8.8 (L) 11/04/2019 0451   CALCIUM 9.3 08/08/2015 0000   GFRNONAA 56 (L) 11/04/2019 0451   GFRNONAA 77 10/08/2016 1144   GFRAA >60 11/04/2019 0451   GFRAA 89 10/08/2016 1144     Assessment/Plan: 5 Days Post-Op   Principal Problem:   Necrotizing celluliitis of lower leg ([place]), right Active Problems:   Chronic congestive heart failure (HCC)   Longstanding persistent atrial fibrillation (HCC)  Necrotizing celluliitis of right lower extremity: - per plastic surgery, plan for  irrigation and debridement and placement of wound matrix next week. Will continue to hold Eliquis per recommendation. - dressings changed this morning, no changes changes in status of wounds since yesterday, continue daily dressing changes by nursing    Ventura Bruns 11/04/2019, 6:47 AM

## 2019-11-04 NOTE — Progress Notes (Signed)
PROGRESS NOTE    Joel Dominguez  [id number] DOB: [birth year] DOA: 10/24/2019 PCP: Mellody Dance, DO     Brief Narrative:  Joel Dominguez is a  68 year old male with history of A. fib on Eliquis, AAS, diastolic CHF, moderate MR, morbid obesity with BMI 47, obese, OSA on CPAP presents with right leg leg necrotizing cellulitis.  On presentation he was febrile, leukocytosis 20 K underwent excisional debridement of the skin and subcutaneous tissue of the right lower extremity by Ortho 12/26.  Patient has been on antibiotics, wound culture results no growth so far.  Nasal swab with MRSA. He underwent excisional debridement 12/26.  New events last 24 hours / Subjective: Doing well without any complaints today.  Pain well controlled.    Assessment & Plan:   Principal Problem:   Necrotizing celluliitis of lower leg ([place]), right Active Problems:   Chronic congestive heart failure (HCC)   Longstanding persistent atrial fibrillation (HCC)   Necrotizing celluliitis of right lower extremity with severe right leg cellulitis -S/p excisional debridement of the skin and subcutaneous tissue of the right lower extremity by Ortho 12/26 -Ortho and plastics following -Aerobic/anaerobic culture from the wound: Gram stain with rare WBC, wound culture no growth so far. MRSA nasal negative.   Blood cultures negative.  Cont Zyvox for now.  -Continue pain control -Leukocytosis resolved -Plastic surgery is planning for irrigation/debridement and placement of wound matrix next week  Bleeding from the right lower extremity -Eliquis has been on hold -resume when cleared with orthopedic surgery service -Continue tight dressing applied by ortho   Acute blood loss anemia due to above -Hgb 8.4.  Remains stable.  Continue to monitor  AKI on CKD stage IIIa -Baseline creatinine 1.2 -AKI improved  Cronic congestive heart failure/pulmonary hypertension -Stable without exacerbation -Continue  Bumex  Essential hypertension -Continue Coreg   Persistent atrial fibrillation -Intermittently and RVR. Continue Coreg, continue as needed Lopressor.  Holding Eliquis.   CVA history without residual deficit -Stable. Holding Eliquis due to bleeding.  OSA on CPAP  -Cont CPAP qhs   Morbid obesity -Estimated body mass index is 44.35 kg/m as calculated from the following:   Height as of this encounter: 6' (1.829 m).   Weight as of this encounter: 148.3 kg.    In agreement with assessment of the pressure ulcer as below:  Pressure Injury 10/28/19 Buttocks Right;Left;Mid Deep Tissue Pressure Injury - Purple or maroon localized area of discolored intact skin or blood-filled blister due to damage of underlying soft tissue from pressure and/or shear.  (Active)  10/28/19 0800  Location: Buttocks  Location Orientation: Right;Left;Mid  Staging: Deep Tissue Pressure Injury - Purple or maroon localized area of discolored intact skin or blood-filled blister due to damage of underlying soft tissue from pressure and/or shear.  Wound Description (Comments):   Present on Admission:          DVT prophylaxis: Eliquis on hold due to blood loss anemia Code Status: Full code Family Communication: None at bedside Disposition Plan: CIR eval pending.  Irrigation and debridement and wound matrix placement planned for next week per plastic surgery   Consultants:   Orthopedic surgery  Plastic surgery   Antimicrobials:  Anti-infectives (From admission, onward)   Start     Dose/Rate Route Frequency Ordered Stop   10/28/19 1800  linezolid (ZYVOX) tablet 600 mg     600 mg Oral Every 12 hours 10/28/19 1509     10/28/19 1515  linezolid (ZYVOX) tablet 600 mg  Status:  Discontinued     600 mg Oral Every 12 hours 10/28/19 1500 10/28/19 1501   10/28/19 1430  linezolid (ZYVOX) IVPB 600 mg  Status:  Discontinued     600 mg 300 mL/hr over 60 Minutes Intravenous Every 12 hours 10/28/19 1425 10/28/19  1500   10/26/19 1200  ampicillin (OMNIPEN) 2 g in sodium chloride 0.9 % 100 mL IVPB  Status:  Discontinued     2 g 300 mL/hr over 20 Minutes Intravenous Every 4 hours 10/26/19 1009 10/28/19 1421   10/25/19 1500  vancomycin (VANCOREADY) IVPB 1500 mg/300 mL  Status:  Discontinued     1,500 mg 150 mL/hr over 120 Minutes Intravenous Every 24 hours 10/24/19 1354 10/26/19 1009   10/24/19 2200  aztreonam (AZACTAM) 2 g in sodium chloride 0.9 % 100 mL IVPB  Status:  Discontinued     2 g 200 mL/hr over 30 Minutes Intravenous Every 8 hours 10/24/19 1354 10/25/19 0956   10/24/19 1300  vancomycin (VANCOCIN) 2,500 mg in sodium chloride 0.9 % 500 mL IVPB     2,500 mg 250 mL/hr over 120 Minutes Intravenous  Once 10/24/19 1259 10/24/19 1642   10/24/19 1245  vancomycin (VANCOCIN) IVPB 1000 mg/200 mL premix  Status:  Discontinued     1,000 mg 200 mL/hr over 60 Minutes Intravenous  Once 10/24/19 1240 10/24/19 1259   10/24/19 1245  aztreonam (AZACTAM) 2 g in sodium chloride 0.9 % 100 mL IVPB     2 g 200 mL/hr over 30 Minutes Intravenous  Once 10/24/19 1240 10/24/19 1819       Objective: Vitals:   11/03/19 1738 11/03/19 2119 11/04/19 0656 11/04/19 0847  BP: 120/65 (!) 119/49 131/70 140/61  Pulse: 98 88 88 90  Resp: 14 16 18 16   Temp:  98.1 F (36.7 C)    TempSrc:  Oral    SpO2: 90% 94% 95% 94%  Weight:   (!) 148.3 kg   Height:        Intake/Output Summary (Last 24 hours) at 11/04/2019 0903 Last data filed at 11/04/2019 0851 Gross per 24 hour  Intake 420 ml  Output 1725 ml  Net -1305 ml   Filed Weights   11/02/19 0545 11/03/19 0414 11/04/19 0656  Weight: (!) 149.1 kg (!) 149.2 kg (!) 148.3 kg    Examination: General exam: Appears calm and comfortable  Respiratory system: Clear to auscultation. Respiratory effort normal. Cardiovascular system: S1 & S2 heard, irregular rhythm. No pedal edema. Gastrointestinal system: Abdomen is nondistended, soft and nontender. Normal bowel sounds  heard. Central nervous system: Alert and oriented. Non focal exam. Speech clear  Extremities: Right lower extremity in Ace wrap  Psychiatry: Judgement and insight appear stable. Mood & affect appropriate.    Data Reviewed: I have personally reviewed following labs and imaging studies  CBC: Recent Labs  Lab 10/31/19 0451 11/01/19 0343 11/02/19 0458 11/03/19 0622 11/04/19 0451  WBC 15.5* 12.6* 9.8 9.5 10.2  HGB 10.2* 8.8* 8.1* 8.6* 8.4*  HCT 32.0* 28.1* 25.9* 27.5* 26.2*  MCV 91.2 92.1 91.8 89.9 91.0  PLT 399 464* 420* 435* [id number]*   Basic Metabolic Panel: Recent Labs  Lab 10/29/19 0328 10/30/19 0708 10/31/19 0451 11/01/19 0343 11/02/19 0458 11/03/19 0622 11/04/19 0451  NA 138 140 136 136 137 137 136  K 3.9 3.9 4.6 5.0 4.1 4.4 4.6  CL 100 99 97* 97* 98 96* 97*  CO2 28 32 27 31 30 31 30   GLUCOSE 126* 121* 151* 137* 111* 126*  127*  BUN 26* 20 27* 33* 28* 23 23  CREATININE 1.20 1.26* 1.35* 1.62* 1.35* 1.27* 1.31*  CALCIUM 8.6* 8.7* 8.3* 8.3* 8.4* 9.0 8.8*  MG 1.9 2.0 2.2 2.3  --   --   --   PHOS  --   --   --  4.0  --   --   --    GFR: Estimated Creatinine Clearance: 80.8 mL/min (A) (by C-G formula based on SCr of 1.31 mg/dL (H)). Liver Function Tests: Recent Labs  Lab 11/01/19 0343  ALBUMIN 2.2*   No results for input(s): LIPASE, AMYLASE in the last 168 hours. No results for input(s): AMMONIA in the last 168 hours. Coagulation Profile: No results for input(s): INR, PROTIME in the last 168 hours. Cardiac Enzymes: Recent Labs  Lab 11/04/19 0451  CKTOTAL 42*   BNP (last 3 results) No results for input(s): PROBNP in the last 8760 hours. HbA1C: No results for input(s): HGBA1C in the last 72 hours. CBG: Recent Labs  Lab 10/30/19 1417  GLUCAP 122*   Lipid Profile: No results for input(s): CHOL, HDL, LDLCALC, TRIG, CHOLHDL, LDLDIRECT in the last 72 hours. Thyroid Function Tests: No results for input(s): TSH, T4TOTAL, FREET4, T3FREE, THYROIDAB in the last 72  hours. Anemia Panel: No results for input(s): VITAMINB12, FOLATE, FERRITIN, TIBC, IRON, RETICCTPCT in the last 72 hours. Sepsis Labs: No results for input(s): PROCALCITON, LATICACIDVEN in the last 168 hours.  Recent Results (from the past 240 hour(s))  Surgical pcr screen     Status: None   Collection Time: 10/30/19 11:55 AM   Specimen: Nasal Mucosa; Nasal Swab  Result Value Ref Range Status   MRSA, PCR NEGATIVE NEGATIVE Final   Staphylococcus aureus NEGATIVE NEGATIVE Final    Comment: (NOTE) The Xpert SA Assay (FDA approved for NASAL specimens in patients 84 years of age and older), is one component of a comprehensive surveillance program. It is not intended to diagnose infection nor to guide or monitor treatment. Performed at [facility], [place] [address], [place], [place] [address]   Aerobic/Anaerobic Culture (surgical/deep wound)     Status: None (Preliminary result)   Collection Time: 10/30/19  1:39 PM   Specimen: Soft Tissue, Other  Result Value Ref Range Status   Specimen Description TISSUE RIGHT LEG  Final   Special Requests NONE  Final   Gram Stain   Final    RARE WBC PRESENT,BOTH PMN AND MONONUCLEAR NO ORGANISMS SEEN    Culture   Final    NO GROWTH 4 DAYS NO ANAEROBES ISOLATED; CULTURE IN PROGRESS FOR 5 DAYS Performed at [facility], [place] [address], [place], [place] [address]    Report Status PENDING  Incomplete      Radiology Studies: No results found.    Scheduled Meds: . bumetanide  2 mg Oral BID  . carvedilol  25 mg Oral BID WC  . docusate sodium  100 mg Oral BID  . linezolid  600 mg Oral Q12H  . senna  1 tablet Oral BID   Continuous Infusions: . sodium chloride 250 mL (10/26/19 1204)  . sodium chloride       LOS: 10 days      Time spent: 25 minutes   Dessa Phi, DO [facility] 11/04/2019, 9:03 AM   Available via Epic secure chat 7am-7pm After these hours, please refer to coverage provider listed on [email or web address]

## 2019-11-04 NOTE — Progress Notes (Signed)
Patient placed self on home CPAP.  ?

## 2019-11-05 ENCOUNTER — Encounter (HOSPITAL_COMMUNITY): Payer: Self-pay | Admitting: Internal Medicine

## 2019-11-05 DIAGNOSIS — L0390 Cellulitis, unspecified: Secondary | ICD-10-CM

## 2019-11-05 DIAGNOSIS — D62 Acute posthemorrhagic anemia: Secondary | ICD-10-CM | POA: Diagnosis not present

## 2019-11-05 HISTORY — DX: Cellulitis, unspecified: L03.90

## 2019-11-05 LAB — BASIC METABOLIC PANEL
Anion gap: 9 (ref 5–15)
BUN: 19 mg/dL (ref 8–23)
CO2: 31 mmol/L (ref 22–32)
Calcium: 8.7 mg/dL — ABNORMAL LOW (ref 8.9–10.3)
Chloride: 96 mmol/L — ABNORMAL LOW (ref 98–111)
Creatinine, Ser: 1.3 mg/dL — ABNORMAL HIGH (ref 0.61–1.24)
GFR calc Af Amer: >60 mL/min (ref >60)
GFR calc non Af Amer: 56 mL/min — ABNORMAL LOW (ref >60)
Glucose, Bld: 118 mg/dL — ABNORMAL HIGH (ref 70–99)
Potassium: 4 mmol/L (ref 3.5–5.1)
Sodium: 136 mmol/L (ref 135–145)

## 2019-11-05 LAB — CBC
HCT: 25.3 % — ABNORMAL LOW (ref 39.0–52.0)
Hemoglobin: 8.1 g/dL — ABNORMAL LOW (ref 13.0–17.0)
MCH: 29.3 pg (ref 26.0–34.0)
MCHC: 32 g/dL (ref 30.0–36.0)
MCV: 91.7 fL (ref 80.0–100.0)
Platelets: 483 10*3/uL — ABNORMAL HIGH (ref 150–400)
RBC: 2.76 MIL/uL — ABNORMAL LOW (ref 4.22–5.81)
RDW: 14.1 % (ref 11.5–15.5)
WBC: 8.6 10*3/uL (ref 4.0–10.5)
nRBC: 0 % (ref 0.0–0.2)

## 2019-11-05 NOTE — Progress Notes (Signed)
Orthopaedic Trauma Progress Note  S: Patient doing well this morning. He reports good pain control. No new drainage from wound. No fever, chills, abdominal pain, fatigue, or lightheadedness.   O:  Vitals:   11/04/19 2030 11/05/19 0631  BP: (!) 115/59 126/70  Pulse: 93 84  Resp: 20 20  Temp: 98.9 F (37.2 C) 98.3 F (36.8 C)  SpO2: 94% 96%    General - Laying in bed, no acute distress Cardiac -  Heart regular rate and rhythm Respiratory -  No increased work of breathing. Lungs CTA anterior lung fields bilaterally Right lower extremity - Dressings in place are clean dry and intact.  Sensation intact to light touch of the dorsum and plantar aspect of the foot and toes. + EHL. + FHL.  Able to lift his leg a few inches off the bed.   Labs:  Results for orders placed or performed during the hospital encounter of 10/24/19 (from the past 24 hour(s))  CBC tmrw AM     Status: Abnormal   Collection Time: 11/05/19  5:30 AM  Result Value Ref Range   WBC 8.6 4.0 - 10.5 K/uL   RBC 2.76 (L) 4.22 - 5.81 MIL/uL   Hemoglobin 8.1 (L) 13.0 - 17.0 g/dL   HCT 25.3 (L) 39.0 - 52.0 %   MCV 91.7 80.0 - 100.0 fL   MCH 29.3 26.0 - 34.0 pg   MCHC 32.0 30.0 - 36.0 g/dL   RDW 14.1 11.5 - 15.5 %   Platelets 483 (H) 150 - 400 K/uL   nRBC 0.0 0.0 - 0.2 %  BMP tmrw AM     Status: Abnormal   Collection Time: 11/05/19  5:30 AM  Result Value Ref Range   Sodium 136 135 - 145 mmol/L   Potassium 4.0 3.5 - 5.1 mmol/L   Chloride 96 (L) 98 - 111 mmol/L   CO2 31 22 - 32 mmol/L   Glucose, Bld 118 (H) 70 - 99 mg/dL   BUN 19 8 - 23 mg/dL   Creatinine, Ser 1.30 (H) 0.61 - 1.24 mg/dL   Calcium 8.7 (L) 8.9 - 10.3 mg/dL   GFR calc non Af Amer 56 (L) >60 mL/min   GFR calc Af Amer >60 >60 mL/min   Anion gap 9 5 - 15    Assessment: 69 year old male with necrotizing cellulitis of right lower extremity s/p excisional debridement on 10/30/2019   Weightbearing: WBAT RLE  Insicional and dressing care: Continue daily  dressing changes per nursing  Pain management: Continue with current regimen  VTE prophylaxis: Continue to hold Eliquis per plastic surgery recommendation  ID: Zyvox per ID  Plan: - Per plastic surgery, plan for irrigation and debridement and placement of wound matrix on 11/10/2018. Continue to hold Eliquis per recommendation. - Continue daily dressing changes by nursing     Zakye Baby A. [facility] [phone] (office) [email or web address]

## 2019-11-05 NOTE — Progress Notes (Signed)
Pt places himself on and off home CPAP

## 2019-11-05 NOTE — Plan of Care (Signed)
  Problem: Education: Goal: Ability to demonstrate management of disease process will improve Outcome: Progressing   Problem: Activity: Goal: Capacity to carry out activities will improve Outcome: Progressing   

## 2019-11-05 NOTE — Progress Notes (Signed)
PROGRESS NOTE    Joel Dominguez  [id number] DOB: [birth year] DOA: 10/24/2019 PCP: Mellody Dance, DO   Brief Narrative:  Joel Dominguez is a 69 year old male with history of A. fib on Eliquis, AAS, diastolic CHF, moderate MR, morbid obesity with BMI 47, obese, OSA on CPAP presents with right leg leg necrotizing cellulitis. On presentation he was febrile, leukocytosis 20K underwent excisional debridement of the skin and subcutaneous tissue of the right lower extremity by Ortho 12/26.Patient has been on antibiotics, wound culture results no growth so far. Nasal swab with MRSA. He underwent excisional debridement 12/26. Had to stop anti-coag tx due to sig bleeding from wound dressing changes Due for more surgery 1/7 - plastics   Assessment & Plan:   Principal Problem:   Necrotizing celluliitis of lower leg ([place]), right Active Problems:   Chronic congestive heart failure (HCC)   Longstanding persistent atrial fibrillation (HCC)   Acute blood loss anemia  Necrotizing celluliitis of right lower extremity with severe right leg cellulitis  -S/p excisional debridement of the skin and subcutaneous tissue of the right lower extremity by Ortho 12/26  -Ortho and plastics following  -Aerobic/anaerobic culture from the wound: Gram stain with rare WBC, wound culture no growth so far. MRSA nasal negative. Blood cultures negative.  - day 7 Linezolid continue  -Continue pain control - this is improved -Leukocytosis resolved  -Plastic surgery is planning for irrigation/debridement and placement of wound matrix 1/7   Bleeding from the right lower extremity causing acute blood loss anemia  -Eliquis has been on hold -resume when cleared with orthopedic surgery service  -Continue tight dressing changes by RN -Hgb 8 range. Remains stable. Continue to monitor   AKI on CKD stage IIIa  -Baseline creatinine 1.2  -AKI improved  Creat 1.3   Cronic congestive heart failure/pulmonary hypertension    -Stable without exacerbation  -Continue Bumex   Essential hypertension  -Continue Coreg   Persistent atrial fibrillation  -Intermittently and RVR. Continue Coreg, continue as needed Lopressor. Holding Eliquis.  CVA history without residual deficit  -Stable. Holding Eliquis due to bleeding.   - ? if we could use low Tx range heparin  OSA on CPAP  -Cont CPAP qhs    Morbid obesity  -Estimated body mass index is 44.35 kg/m as calculated from the following:  Height as of this encounter: 6' (1.829 m).  Weight as of this encounter: 148.3 kg.     DVT prophylaxis: None due to bleeding from wound Code Status: Full Family Communication: wife christine present for visit/exam Disposition Plan: home but after 1/7 plastic surgery  Consultants:   Ortho  Plastic surgery  Procedures:  I7D RLE 12/26  Antimicrobials:   linezolid 12/24   Subjective: Doing ok - no sig pain - controlled w/ medication No bleeding reported since stopping Eliquis Has been oob - gets leg spasms after being in chair for a while  Objective: Vitals:   11/05/19 0631 11/05/19 0937 11/05/19 1156 11/05/19 1820  BP: 126/70 (!) 113/55 (!) 117/52 (!) 105/55  Pulse: 84  90   Resp: 20  17   Temp: 98.3 F (36.8 C)  98 F (36.7 C)   TempSrc: Oral  Oral   SpO2: 96%  95%   Weight: (!) 148.6 kg     Height:        Intake/Output Summary (Last 24 hours) at 11/05/2019 1843 Last data filed at 11/05/2019 1818 Gross per 24 hour  Intake 1300 ml  Output 2075 ml  Net -[id number]  ml   Filed Weights   11/03/19 0414 11/04/19 0656 11/05/19 0631  Weight: (!) 149.2 kg (!) 148.3 kg (!) 148.6 kg    Examination:  General:  NAD, obes wm in bed Eyes:   anicteric Lungs:  clear Heart::  S1S2 no rubs, murmurs or gallops Abdomen:  Obese soft and nontender, BS+ Ext:   Rleg wrapped in bandages, Ace wraps, LLE Nl Neuro/Psych: WNL  Data Reviewed: I have personally reviewed following labs and imaging studies  CBC: Recent  Labs  Lab 11/01/19 0343 11/02/19 0458 11/03/19 0622 11/04/19 0451 11/05/19 0530  WBC 12.6* 9.8 9.5 10.2 8.6  HGB 8.8* 8.1* 8.6* 8.4* 8.1*  HCT 28.1* 25.9* 27.5* 26.2* 25.3*  MCV 92.1 91.8 89.9 91.0 91.7  PLT 464* 420* 435* 454* [id number]*   Basic Metabolic Panel: Recent Labs  Lab 10/30/19 0708 10/31/19 0451 11/01/19 0343 11/02/19 0458 11/03/19 0622 11/04/19 0451 11/05/19 0530  NA 140 136 136 137 137 136 136  K 3.9 4.6 5.0 4.1 4.4 4.6 4.0  CL 99 97* 97* 98 96* 97* 96*  CO2 32 27 31 30 31 30 31   GLUCOSE 121* 151* 137* 111* 126* 127* 118*  BUN 20 27* 33* 28* 23 23 19   CREATININE 1.26* 1.35* 1.62* 1.35* 1.27* 1.31* 1.30*  CALCIUM 8.7* 8.3* 8.3* 8.4* 9.0 8.8* 8.7*  MG 2.0 2.2 2.3  --   --   --   --   PHOS  --   --  4.0  --   --   --   --    GFR: Estimated Creatinine Clearance: 81.5 mL/min (A) (by C-G formula based on SCr of 1.3 mg/dL (H)). Liver Function Tests: Recent Labs  Lab 11/01/19 0343  ALBUMIN 2.2*   Cardiac Enzymes: Recent Labs  Lab 11/04/19 0451  CKTOTAL 42*   CBG: Recent Labs  Lab 10/30/19 1417  GLUCAP 122*     Recent Results (from the past 240 hour(s))  Surgical pcr screen     Status: None   Collection Time: 10/30/19 11:55 AM   Specimen: Nasal Mucosa; Nasal Swab  Result Value Ref Range Status   MRSA, PCR NEGATIVE NEGATIVE Final   Staphylococcus aureus NEGATIVE NEGATIVE Final    Comment: (NOTE) The Xpert SA Assay (FDA approved for NASAL specimens in patients 64 years of age and older), is one component of a comprehensive surveillance program. It is not intended to diagnose infection nor to guide or monitor treatment. Performed at [facility], [place] [address], [place], [place] [address]   Aerobic/Anaerobic Culture (surgical/deep wound)     Status: None   Collection Time: 10/30/19  1:39 PM   Specimen: Soft Tissue, Other  Result Value Ref Range Status   Specimen Description TISSUE RIGHT LEG  Final   Special Requests NONE  Final   Gram  Stain   Final    RARE WBC PRESENT,BOTH PMN AND MONONUCLEAR NO ORGANISMS SEEN    Culture   Final    No growth aerobically or anaerobically. Performed at [facility], [place] [address], [place], [place] [address]    Report Status 11/04/2019 FINAL  Final         Scheduled Meds: . bumetanide  2 mg Oral BID  . carvedilol  25 mg Oral BID WC  . docusate sodium  100 mg Oral BID  . linezolid  600 mg Oral Q12H  . senna  1 tablet Oral BID   Continuous Infusions: . sodium chloride 250 mL (  10/26/19 1204)  . sodium chloride       LOS: 11 days        Silvano Rusk, MD [facility] Pager [phone]  If 7PM-7AM, please contact night-coverage [email or web address] Password [facility] 11/05/2019, 6:43 PM

## 2019-11-05 NOTE — Progress Notes (Signed)
Physical Therapy Treatment Patient Details Name: Joel Dominguez MRN: [id number] DOB: [birth year] Today's Date: 11/05/2019    History of Present Illness 69 year old male with history of A. fib on Eliquis, AAS, diastolic CHF, moderate MR, morbid obesity and OSA presented with right leg pain and swelling with blisters, admitted for RLE cellulitis. s/p excisional debridement 12/26.    PT Comments    Patient progressing well towards PT goals. Tolerated gait training today with Min A and RW for support. After having BM, noted to be diaphoretic and reports wanting to transfer to chair instead of walk back. HR 90-115 bpm. BP 116/61 post activity. Encouraged walking to bathroom with nursing as much as tolerated to improve mobility over the weekend. Will continue to follow and progress as tolerated.   Follow Up Recommendations  CIR;Supervision/Assistance - 24 hour     Equipment Recommendations  Rolling walker with 5" wheels    Recommendations for Other Services       Precautions / Restrictions Precautions Precautions: Fall Restrictions Weight Bearing Restrictions: No    Mobility  Bed Mobility Overal bed mobility: Needs Assistance Bed Mobility: Rolling;Supine to Sit;Sit to Supine Rolling: Modified independent (Device/Increase time)   Supine to sit: Min assist;HOB elevated     General bed mobility comments: Assist needed for trunk, use of rail.  Transfers Overall transfer level: Needs assistance Equipment used: Rolling walker (2 wheeled) Transfers: Sit to/from [facility] Sit to Stand: Mod assist;Min assist;From elevated surface Stand pivot transfers: Min guard       General transfer comment: Min A to power to standing from elevated bed height; Mod A to stand from toilet using grab bar. SPT toilet to recliner with min gaurd assist and RW.  Ambulation/Gait Ambulation/Gait assistance: Min assist Gait Distance (Feet): 15 Feet Assistive device: Rolling walker (2  wheeled) Gait Pattern/deviations: Step-through pattern;Trunk flexed;Wide base of support Gait velocity: reduced   General Gait Details: Slow, mildly unsteady gait with flexed trunk, reaching for counter at times. HR 90-115 bpm. Became diaphoretic after BM and asked to transfer to chair instead of walk; BP 116/61 post activity.   Stairs             Wheelchair Mobility    Modified Rankin (Stroke Patients Only)       Balance Overall balance assessment: Needs assistance Sitting-balance support: Feet supported;No upper extremity supported Sitting balance-Leahy Scale: Fair Sitting balance - Comments: steady sitting EOB   Standing balance support: During functional activity Standing balance-Leahy Scale: Poor Standing balance comment: Requires UE support in standing.                            Cognition Arousal/Alertness: Awake/alert Behavior During Therapy: WFL for tasks assessed/performed Overall Cognitive Status: Within Functional Limits for tasks assessed                                        Exercises      General Comments General comments (skin integrity, edema, etc.): HR 90-115 bpm.      Pertinent Vitals/Pain Pain Assessment: Faces Faces Pain Scale: Hurts a little bit Pain Location: RLE Pain Descriptors / Indicators: Sore Pain Intervention(s): Repositioned;Monitored during session    Home Living                      Prior Function  PT Goals (current goals can now be found in the care plan section) Progress towards PT goals: Progressing toward goals    Frequency    Min 3X/week      PT Plan Current plan remains appropriate    Co-evaluation              AM-PAC PT "6 Clicks" Mobility   Outcome Measure  Help needed turning from your back to your side while in a flat bed without using bedrails?: A Little Help needed moving from lying on your back to sitting on the side of a flat bed without  using bedrails?: A Little Help needed moving to and from a bed to a chair (including a wheelchair)?: A Lot Help needed standing up from a chair using your arms (e.g., wheelchair or bedside chair)?: A Lot Help needed to walk in hospital room?: A Little Help needed climbing 3-5 steps with a railing? : Total 6 Click Score: 14    End of Session Equipment Utilized During Treatment: Gait belt Activity Tolerance: Patient tolerated treatment well Patient left: in chair;with call bell/phone within reach Nurse Communication: Mobility status PT Visit Diagnosis: Muscle weakness (generalized) (M62.81)     Time: [id number] PT Time Calculation (min) (ACUTE ONLY): 32 min  Charges:  $Therapeutic Activity: 23-37 mins                     Marisa Severin, PT, DPT Acute Rehabilitation Services Pager [phone] Office [phone]       [place] 11/05/2019, 12:39 PM

## 2019-11-05 NOTE — Plan of Care (Signed)
  Problem: Education: Goal: Ability to demonstrate management of disease process will improve Outcome: Progressing   Problem: Education: Goal: Ability to verbalize understanding of medication therapies will improve Outcome: Progressing   Problem: Activity: Goal: Capacity to carry out activities will improve Outcome: Progressing   Problem: Cardiac: Goal: Ability to achieve and maintain adequate cardiopulmonary perfusion will improve Outcome: Progressing   Problem: Education: Goal: Knowledge of General Education information will improve Description: Including pain rating scale, medication(s)/side effects and non-pharmacologic comfort measures Outcome: Progressing   Problem: Clinical Measurements: Goal: Will remain free from infection Outcome: Progressing

## 2019-11-05 NOTE — Plan of Care (Signed)
  Problem: Education: Goal: Ability to demonstrate management of disease process will improve Outcome: Progressing   Problem: Education: Goal: Ability to verbalize understanding of medication therapies will improve Outcome: Progressing   Problem: Clinical Measurements: Goal: Ability to maintain clinical measurements within normal limits will improve Outcome: Progressing   Problem: Clinical Measurements: Goal: Will remain free from infection Outcome: Progressing   Problem: Activity: Goal: Risk for activity intolerance will decrease Outcome: Progressing

## 2019-11-05 NOTE — Progress Notes (Signed)
RN went to change dressing on leg and patient stated that someone from orthopedic does his dressing change. RN will wait.

## 2019-11-06 LAB — CBC
HCT: 26.9 % — ABNORMAL LOW (ref 39.0–52.0)
Hemoglobin: 8.5 g/dL — ABNORMAL LOW (ref 13.0–17.0)
MCH: 29.3 pg (ref 26.0–34.0)
MCHC: 31.6 g/dL (ref 30.0–36.0)
MCV: 92.8 fL (ref 80.0–100.0)
Platelets: 535 10*3/uL — ABNORMAL HIGH (ref 150–400)
RBC: 2.9 MIL/uL — ABNORMAL LOW (ref 4.22–5.81)
RDW: 14.5 % (ref 11.5–15.5)
WBC: 9.3 10*3/uL (ref 4.0–10.5)
nRBC: 0 % (ref 0.0–0.2)

## 2019-11-06 LAB — BASIC METABOLIC PANEL
Anion gap: 11 (ref 5–15)
BUN: 19 mg/dL (ref 8–23)
CO2: 29 mmol/L (ref 22–32)
Calcium: 8.8 mg/dL — ABNORMAL LOW (ref 8.9–10.3)
Chloride: 95 mmol/L — ABNORMAL LOW (ref 98–111)
Creatinine, Ser: 1.28 mg/dL — ABNORMAL HIGH (ref 0.61–1.24)
GFR calc Af Amer: >60 mL/min (ref >60)
GFR calc non Af Amer: 57 mL/min — ABNORMAL LOW (ref >60)
Glucose, Bld: 115 mg/dL — ABNORMAL HIGH (ref 70–99)
Potassium: 3.8 mmol/L (ref 3.5–5.1)
Sodium: 135 mmol/L (ref 135–145)

## 2019-11-06 NOTE — Progress Notes (Signed)
Pt places himself on and off home CPAP

## 2019-11-06 NOTE — Progress Notes (Signed)
Dressing changes to RLE and buttocks done.

## 2019-11-06 NOTE — Progress Notes (Signed)
PROGRESS NOTE    Joel Dominguez  [id number] DOB: 1951-07-04 DOA: 10/24/2019 PCP: Mellody Dance, DO   Brief Narrative:  Joel Dominguez a 69 year old male with history of A. fib on Eliquis, AAS, diastolic CHF, moderate MR, morbid obesity with BMI 47, obese,OSA on CPAP presents with right leg leg necrotizing cellulitis. On presentation he was febrile, leukocytosis 20K underwent excisional debridement of the skin and subcutaneous tissue of the right lower extremity by Ortho 12/26.Patient has been on antibiotics, wound culture results no growth so far. Nasal swab with MRSA. He underwent excisional debridement 12/26. Had to stop anti-coag tx due to sig bleeding from wound dressing changes Due for more surgery 1/7 - plastics   Assessment & Plan:   Principal Problem:   Necrotizing celluliitis of lower leg ([place]), right Active Problems:   Chronic congestive heart failure (HCC)   Longstanding persistent atrial fibrillation (HCC)   Acute blood loss anemia   Necrotizing celluliitis of right lower extremity with severe right leg cellulitis  -S/p excisional debridement of the skin and subcutaneous tissue of the right lower extremity by Ortho 12/26  -Ortho and plastics following, DEFER TO THEM  -Aerobic/anaerobic culture from the wound: Gram stain with rare WBC, wound culture no growth so far. MRSA nasal negative. Blood cultures negative.  - day 8 Linezolid continue  -Continue pain control - this is improved -Leukocytosis resolved  -Plastic surgery is planning for irrigation/debridement and placement of wound matrix 1/7   Bleeding from the right lower extremity causing acute blood loss anemia  -Eliquis has been on hold -resume when cleared with orthopedic surgery service  -Continue tight dressing changes by RN -Hgb 8 range. Remains stable.  8.5 today. Continue to monitor   AKI on CKD stage IIIa  -Baseline creatinine 1.2  -AKI improved  Creat 1.28   Cronic congestive  heart failure/pulmonary hypertension  -Stable without exacerbation  -Continue Bumex   Essential hypertension  -Continue Coreg   Persistent atrial fibrillation  -Intermittently and RVR. Continue Coreg, continue as needed Lopressor. Holding Eliquis.  CVA history without residual deficit  -Stable. Holding Eliquis due to bleeding.   - ? if we could use low Tx range heparin  OSA on CPAP  -Cont CPAP qhs    Morbid obesity  -Estimated body mass index is 44.35 kg/m as calculated from the following:  Height as of this encounter: 6' (1.829 m).  Weight as of this encounter: 148.3 kg.  DVT prophylaxis: SCD/Compression stockings  Code Status: Full code    Code Status Orders  (From admission, onward)         Start     Ordered   10/24/19 1420  Full code  Continuous     10/24/19 1421        Code Status History    This patient has a current code status but no historical code status.   Advance Care Planning Activity     Family Communication: None today Disposition Plan:   Patient remained inpatient continued IV antibiotics, planned plastic surgery intervention.  Patient not stable for discharge Consults called: None Admission status: Inpatient   Consultants:   Orthopedics, plastic surgery  Procedures:  DG Chest 2 View  Result Date: 10/21/2019 CLINICAL DATA:  Shortness of breath and weakness for the past 2 days. EXAM: CHEST - 2 VIEW COMPARISON:  Chest x-ray dated April 15, 2019. FINDINGS: The heart is at the upper limits of normal in size. Atherosclerotic calcification of the aortic arch. Normal pulmonary vascularity. No focal consolidation,  pleural effusion, or pneumothorax. No acute osseous abnormality. IMPRESSION: No active cardiopulmonary disease. Electronically Signed   By: Titus Dubin M.D.   On: 10/21/2019 13:30   DG Chest Port 1 View  Result Date: 10/24/2019 CLINICAL DATA:  Chest pain and shortness of breath EXAM: PORTABLE CHEST 1 VIEW COMPARISON:  October 21, 2019 FINDINGS: Stable cardiomegaly. The hila and mediastinum are normal. No pneumothorax. No nodules or masses. No focal infiltrates. IMPRESSION: No active disease. Electronically Signed   By: Dorise Bullion III M.D   On: 10/24/2019 13:37   CT EXTREMITY LOWER RIGHT WO CONTRAST  Result Date: 10/28/2019 CLINICAL DATA:  Severe right leg cellulitis EXAM: CT OF THE LOWER RIGHT EXTREMITY WITHOUT CONTRAST TECHNIQUE: Multidetector CT imaging of the right lower extremity was performed according to the standard protocol. COMPARISON:  None. FINDINGS: Bones/Joint/Cartilage No fracture or dislocation. There is diffuse osteopenia. Moderate right hip osteoarthritis is seen with superior joint space loss and marginal osteophyte formation. The patient is status post right total knee arthroplasty. No periprosthetic lucency or fracture is identified. The sacroiliac joints appear to be intact. There is a small fragmented osteophyte seen at the superior right sacroiliac joint. Ligaments Suboptimally assessed by CT. Muscles and Tendons There is focal fatty atrophy seen within the mid rectus muscle with a tiny focus calcification, likely from prior focal tear. There is mild fatty atrophy noted throughout the remainder of the muscles. The visualized portions the tendons appear to be intact, however suboptimally visualized. Soft tissues There is diffuse subcutaneous edema and skin thickening seen surrounding the posterior medial lower leg there is a superficial cystic collection. Best seen on series 204, image 312 measuring 3 cm in length. IMPRESSION: 1. No acute osseous abnormality. No definite evidence of osteomyelitis. 2. Diffuse findings suggestive of cellulitis with superficial loculated collection over the posterior medial lower extremity which could represent phlegmon or early superficial abscess. 3. Focal fatty atrophy of the mid rectus femoris musculature, likely from a prior tear. Electronically Signed   By: Prudencio Pair  M.D.   On: 10/28/2019 19:08   DVT  Result Date: 10/27/2019  Lower Venous Study Indications: Edema, and Swelling.  Limitations: Body habitus and poor ultrasound/tissue interface. Comparison Study: no prior Performing Technologist: Abram Sander RVS  Examination Guidelines: A complete evaluation includes B-mode imaging, spectral Doppler, color Doppler, and power Doppler as needed of all accessible portions of each vessel. Bilateral testing is considered an integral part of a complete examination. Limited examinations for reoccurring indications may be performed as noted.  +---------+---------------+---------+-----------+----------+-------------------+ RIGHT    CompressibilityPhasicitySpontaneityPropertiesThrombus Aging      +---------+---------------+---------+-----------+----------+-------------------+ CFV      Full                                                             +---------+---------------+---------+-----------+----------+-------------------+ SFJ      Full                                                             +---------+---------------+---------+-----------+----------+-------------------+ FV Prox  Full                                                             +---------+---------------+---------+-----------+----------+-------------------+  FV Mid   Full                                                             +---------+---------------+---------+-----------+----------+-------------------+ FV Distal               Yes      Yes                  unable to visualize                                                       well for                                                                  compression         +---------+---------------+---------+-----------+----------+-------------------+ PFV      Full                                                              +---------+---------------+---------+-----------+----------+-------------------+ POP      Full                                                             +---------+---------------+---------+-----------+----------+-------------------+ PTV      Full                                                             +---------+---------------+---------+-----------+----------+-------------------+ PERO                                                  Not visualized      +---------+---------------+---------+-----------+----------+-------------------+   +---------+---------------+---------+-----------+----------+--------------+ LEFT     CompressibilityPhasicitySpontaneityPropertiesThrombus Aging +---------+---------------+---------+-----------+----------+--------------+ CFV      Full           Yes      Yes                                 +---------+---------------+---------+-----------+----------+--------------+ SFJ      Full                                                        +---------+---------------+---------+-----------+----------+--------------+  FV Prox  Full                                                        +---------+---------------+---------+-----------+----------+--------------+ FV Mid   Full                                                        +---------+---------------+---------+-----------+----------+--------------+ FV DistalFull                                                        +---------+---------------+---------+-----------+----------+--------------+ PFV      Full                                                        +---------+---------------+---------+-----------+----------+--------------+ POP      Full           Yes      Yes                                 +---------+---------------+---------+-----------+----------+--------------+ PTV      Full                                                         +---------+---------------+---------+-----------+----------+--------------+ PERO                                                  Not visualized +---------+---------------+---------+-----------+----------+--------------+     Summary: Right: There is no evidence of deep vein thrombosis in the lower extremity. However, portions of this examination were limited- see technologist comments above. No cystic structure found in the popliteal fossa. Left: There is no evidence of deep vein thrombosis in the lower extremity. No cystic structure found in the popliteal fossa.  *See table(s) above for measurements and observations. Electronically signed by Curt Jews MD on 10/27/2019 at 4:02:21 PM.    Final      Antimicrobials:   Linezolid December 24   Subjective: No acute events overnight, reports pain well controlled Again reports no additional bleeding since stopping Eliquis  Objective: Vitals:   11/05/19 1156 11/05/19 1820 11/05/19 2051 11/06/19 0533  BP: (!) 117/52 (!) 105/55 130/77 (!) 117/57  Pulse: 90  84 85  Resp: 17  20 20   Temp: 98 F (36.7 C)  98.3 F (36.8 C) 98.1 F (36.7 C)  TempSrc: Oral  Oral Oral  SpO2: 95%  93% 95%  Weight:    (!) 149.2 kg  Height:  Intake/Output Summary (Last 24 hours) at 11/06/2019 1420 Last data filed at 11/06/2019 1300 Gross per 24 hour  Intake 940 ml  Output 2625 ml  Net -1685 ml   Filed Weights   11/04/19 0656 11/05/19 0631 11/06/19 0533  Weight: (!) 148.3 kg (!) 148.6 kg (!) 149.2 kg    Examination:  General exam: Appears calm and comfortable  Respiratory system: Clear to auscultation. Respiratory effort normal. Cardiovascular system: S1 & S2 heard, RRR. No JVD, murmurs, rubs, gallops or clicks. No pedal edema. Gastrointestinal system: Abdomen is nondistended, soft and nontender. No organomegaly or masses felt. Normal bowel sounds heard. Central nervous system: Alert and oriented. No focal neurological deficits. Extremities: Range  of motion on right lower extremity secondary to surgical intervention  skin: No rashes, lesions or ulcers right lower extremity in wrapping did not take down, left lower extremity neurovascularly intact Psychiatry: Judgement and insight appear normal. Mood & affect appropriate.     Data Reviewed: I have personally reviewed following labs and imaging studies  CBC: Recent Labs  Lab 11/02/19 0458 11/03/19 0622 11/04/19 0451 11/05/19 0530 11/06/19 0417  WBC 9.8 9.5 10.2 8.6 9.3  HGB 8.1* 8.6* 8.4* 8.1* 8.5*  HCT 25.9* 27.5* 26.2* 25.3* 26.9*  MCV 91.8 89.9 91.0 91.7 92.8  PLT 420* 435* 454* 483* [id number]*   Basic Metabolic Panel: Recent Labs  Lab 10/31/19 0451 11/01/19 0343 11/02/19 0458 11/03/19 0622 11/04/19 0451 11/05/19 0530 11/06/19 0417  NA 136 136 137 137 136 136 135  K 4.6 5.0 4.1 4.4 4.6 4.0 3.8  CL 97* 97* 98 96* 97* 96* 95*  CO2 27 31 30 31 30 31 29   GLUCOSE 151* 137* 111* 126* 127* 118* 115*  BUN 27* 33* 28* 23 23 19 19   CREATININE 1.35* 1.62* 1.35* 1.27* 1.31* 1.30* 1.28*  CALCIUM 8.3* 8.3* 8.4* 9.0 8.8* 8.7* 8.8*  MG 2.2 2.3  --   --   --   --   --   PHOS  --  4.0  --   --   --   --   --    GFR: Estimated Creatinine Clearance: 83 mL/min (A) (by C-G formula based on SCr of 1.28 mg/dL (H)). Liver Function Tests: Recent Labs  Lab 11/01/19 0343  ALBUMIN 2.2*   No results for input(s): LIPASE, AMYLASE in the last 168 hours. No results for input(s): AMMONIA in the last 168 hours. Coagulation Profile: No results for input(s): INR, PROTIME in the last 168 hours. Cardiac Enzymes: Recent Labs  Lab 11/04/19 0451  CKTOTAL 42*   BNP (last 3 results) No results for input(s): PROBNP in the last 8760 hours. HbA1C: No results for input(s): HGBA1C in the last 72 hours. CBG: No results for input(s): GLUCAP in the last 168 hours. Lipid Profile: No results for input(s): CHOL, HDL, LDLCALC, TRIG, CHOLHDL, LDLDIRECT in the last 72 hours. Thyroid Function Tests: No  results for input(s): TSH, T4TOTAL, FREET4, T3FREE, THYROIDAB in the last 72 hours. Anemia Panel: No results for input(s): VITAMINB12, FOLATE, FERRITIN, TIBC, IRON, RETICCTPCT in the last 72 hours. Sepsis Labs: No results for input(s): PROCALCITON, LATICACIDVEN in the last 168 hours.  Recent Results (from the past 240 hour(s))  Surgical pcr screen     Status: None   Collection Time: 10/30/19 11:55 AM   Specimen: Nasal Mucosa; Nasal Swab  Result Value Ref Range Status   MRSA, PCR NEGATIVE NEGATIVE Final   Staphylococcus aureus NEGATIVE NEGATIVE Final    Comment: (NOTE)  The Xpert SA Assay (FDA approved for NASAL specimens in patients 67 years of age and older), is one component of a comprehensive surveillance program. It is not intended to diagnose infection nor to guide or monitor treatment. Performed at [facility], [place] [address], [place], [place] [address]   Aerobic/Anaerobic Culture (surgical/deep wound)     Status: None   Collection Time: 10/30/19  1:39 PM   Specimen: Soft Tissue, Other  Result Value Ref Range Status   Specimen Description TISSUE RIGHT LEG  Final   Special Requests NONE  Final   Gram Stain   Final    RARE WBC PRESENT,BOTH PMN AND MONONUCLEAR NO ORGANISMS SEEN    Culture   Final    No growth aerobically or anaerobically. Performed at [facility], [place] [address], [place], [place] [address]    Report Status 11/04/2019 FINAL  Final         Radiology Studies: No results found.      Scheduled Meds: . bumetanide  2 mg Oral BID  . carvedilol  25 mg Oral BID WC  . docusate sodium  100 mg Oral BID  . linezolid  600 mg Oral Q12H  . senna  1 tablet Oral BID   Continuous Infusions: . sodium chloride 250 mL (10/26/19 1204)  . sodium chloride       LOS: 12 days    Time spent: 35 minutes    Nicolette Bang, MD [facility]  If 7PM-7AM, please contact night-coverage  11/06/2019, 2:20 PM

## 2019-11-07 LAB — CBC WITH DIFFERENTIAL/PLATELET
Abs Immature Granulocytes: 0.05 10*3/uL (ref 0.00–0.07)
Basophils Absolute: 0.1 10*3/uL (ref 0.0–0.1)
Basophils Relative: 1 %
Eosinophils Absolute: 0.2 10*3/uL (ref 0.0–0.5)
Eosinophils Relative: 2 %
HCT: 26.7 % — ABNORMAL LOW (ref 39.0–52.0)
Hemoglobin: 8.5 g/dL — ABNORMAL LOW (ref 13.0–17.0)
Immature Granulocytes: 1 %
Lymphocytes Relative: 13 %
Lymphs Abs: 1.4 10*3/uL (ref 0.7–4.0)
MCH: 29.6 pg (ref 26.0–34.0)
MCHC: 31.8 g/dL (ref 30.0–36.0)
MCV: 93 fL (ref 80.0–100.0)
Monocytes Absolute: 0.8 10*3/uL (ref 0.1–1.0)
Monocytes Relative: 7 %
Neutro Abs: 8.5 10*3/uL — ABNORMAL HIGH (ref 1.7–7.7)
Neutrophils Relative %: 76 %
Platelets: 549 10*3/uL — ABNORMAL HIGH (ref 150–400)
RBC: 2.87 MIL/uL — ABNORMAL LOW (ref 4.22–5.81)
RDW: 14.9 % (ref 11.5–15.5)
WBC: 11 10*3/uL — ABNORMAL HIGH (ref 4.0–10.5)
nRBC: 0 % (ref 0.0–0.2)

## 2019-11-07 LAB — BASIC METABOLIC PANEL
Anion gap: 10 (ref 5–15)
BUN: 17 mg/dL (ref 8–23)
CO2: 29 mmol/L (ref 22–32)
Calcium: 8.6 mg/dL — ABNORMAL LOW (ref 8.9–10.3)
Chloride: 97 mmol/L — ABNORMAL LOW (ref 98–111)
Creatinine, Ser: 1.18 mg/dL (ref 0.61–1.24)
GFR calc Af Amer: >60 mL/min (ref >60)
GFR calc non Af Amer: >60 mL/min (ref >60)
Glucose, Bld: 119 mg/dL — ABNORMAL HIGH (ref 70–99)
Potassium: 3.9 mmol/L (ref 3.5–5.1)
Sodium: 136 mmol/L (ref 135–145)

## 2019-11-07 NOTE — Progress Notes (Signed)
PROGRESS NOTE    Joel Dominguez  [id number] DOB: [birth year] DOA: 10/24/2019 PCP: Mellody Dance, DO   Brief Narrative:  Joel Dominguez a 69 year old male with history of A. fib on Eliquis, AAS, diastolic CHF, moderate MR, morbid obesity with BMI 47, obese,OSA on CPAP presents with right leg leg necrotizing cellulitis. On presentation he was febrile, leukocytosis 20K underwent excisional debridement of the skin and subcutaneous tissue of the right lower extremity by Ortho 12/26.Patient has been on antibiotics, wound culture results no growth so far. Nasal swab with MRSA. He underwent excisional debridement 12/26.Had to stop anti-coag tx due to sig bleeding from wound dressing changes Due for more surgery 1/7 - plastics   Assessment & Plan:   Principal Problem:   Necrotizing celluliitis of lower leg ([place]), right Active Problems:   Chronic congestive heart failure (HCC)   Longstanding persistent atrial fibrillation (HCC)   Acute blood loss anemia   Necrotizing celluliitis of right lower extremity with severe right leg cellulitis  -S/p excisional debridement of the skin and subcutaneous tissue of the right lower extremity by Ortho 12/26  -Ortho and plastics following, DEFER TO THEM -no changes over the weekend -Aerobic/anaerobic culture from the wound: Gram stain with rare WBC, wound culture no growth so far. MRSA nasal negative. Blood cultures negative. - day 9 Linezolid continue -Continue pain control- this is improved -Leukocytosis resolved  -Plastic surgery is planning for irrigation/debridement and placement of wound matrix1/7   Bleeding from the right lower extremity causing acute blood loss anemia  -Eliquis has been on hold -resume when cleared with orthopedic surgery service  -Continue tight dressingchanges by RN -Hgb8 range. Remains stable.  8.5 today. Continue to monitor   AKI on CKD stage IIIa  -Baseline creatinine 1.2  -AKI improvedCreat  1.28   Cronic congestive heart failure/pulmonary hypertension  -Stable without exacerbation  -Continue Bumex   Essential hypertension  -Continue Coreg   Persistent atrial fibrillation  -Intermittently and RVR. Continue Coreg, continue as needed Lopressor. Holding Eliquis.  CVA history without residual deficit  -Stable. Holding Eliquis due to bleeding.  - ? if we could use low Tx range heparin  OSA on CPAP  -Cont CPAP qhs    Morbid obesity  -Estimated body mass index is 44.35 kg/mas calculated from the following:  Height as of this encounter: 6' (1.829 m).  Weight as of this encounter: 148.3 kg.  DVT prophylaxis: SCD/Compression stockings  Code Status: full    Code Status Orders  (From admission, onward)         Start     Ordered   10/24/19 1420  Full code  Continuous     10/24/19 1421        Code Status History    This patient has a current code status but no historical code status.   Advance Care Planning Activity     Family Communication: none today  Disposition Plan:   Patient remained inpatient continued IV antibiotics and planned plastic surgery operative intervention Consults called: None Admission status: Inpatient   Consultants:   Plastic surgery, orthopedics  Procedures:  DG Chest 2 View  Result Date: 10/21/2019 CLINICAL DATA:  Shortness of breath and weakness for the past 2 days. EXAM: CHEST - 2 VIEW COMPARISON:  Chest x-ray dated April 15, 2019. FINDINGS: The heart is at the upper limits of normal in size. Atherosclerotic calcification of the aortic arch. Normal pulmonary vascularity. No focal consolidation, pleural effusion, or pneumothorax. No acute osseous abnormality. IMPRESSION: No active  cardiopulmonary disease. Electronically Signed   By: Titus Dubin M.D.   On: 10/21/2019 13:30   DG Chest Port 1 View  Result Date: 10/24/2019 CLINICAL DATA:  Chest pain and shortness of breath EXAM: PORTABLE CHEST 1 VIEW COMPARISON:   October 21, 2019 FINDINGS: Stable cardiomegaly. The hila and mediastinum are normal. No pneumothorax. No nodules or masses. No focal infiltrates. IMPRESSION: No active disease. Electronically Signed   By: Dorise Bullion III M.D   On: 10/24/2019 13:37   CT EXTREMITY LOWER RIGHT WO CONTRAST  Result Date: 10/28/2019 CLINICAL DATA:  Severe right leg cellulitis EXAM: CT OF THE LOWER RIGHT EXTREMITY WITHOUT CONTRAST TECHNIQUE: Multidetector CT imaging of the right lower extremity was performed according to the standard protocol. COMPARISON:  None. FINDINGS: Bones/Joint/Cartilage No fracture or dislocation. There is diffuse osteopenia. Moderate right hip osteoarthritis is seen with superior joint space loss and marginal osteophyte formation. The patient is status post right total knee arthroplasty. No periprosthetic lucency or fracture is identified. The sacroiliac joints appear to be intact. There is a small fragmented osteophyte seen at the superior right sacroiliac joint. Ligaments Suboptimally assessed by CT. Muscles and Tendons There is focal fatty atrophy seen within the mid rectus muscle with a tiny focus calcification, likely from prior focal tear. There is mild fatty atrophy noted throughout the remainder of the muscles. The visualized portions the tendons appear to be intact, however suboptimally visualized. Soft tissues There is diffuse subcutaneous edema and skin thickening seen surrounding the posterior medial lower leg there is a superficial cystic collection. Best seen on series 204, image 312 measuring 3 cm in length. IMPRESSION: 1. No acute osseous abnormality. No definite evidence of osteomyelitis. 2. Diffuse findings suggestive of cellulitis with superficial loculated collection over the posterior medial lower extremity which could represent phlegmon or early superficial abscess. 3. Focal fatty atrophy of the mid rectus femoris musculature, likely from a prior tear. Electronically Signed   By:  Prudencio Pair M.D.   On: 10/28/2019 19:08   DVT  Result Date: 10/27/2019  Lower Venous Study Indications: Edema, and Swelling.  Limitations: Body habitus and poor ultrasound/tissue interface. Comparison Study: no prior Performing Technologist: Abram Sander RVS  Examination Guidelines: A complete evaluation includes B-mode imaging, spectral Doppler, color Doppler, and power Doppler as needed of all accessible portions of each vessel. Bilateral testing is considered an integral part of a complete examination. Limited examinations for reoccurring indications may be performed as noted.  +---------+---------------+---------+-----------+----------+-------------------+ RIGHT    CompressibilityPhasicitySpontaneityPropertiesThrombus Aging      +---------+---------------+---------+-----------+----------+-------------------+ CFV      Full                                                             +---------+---------------+---------+-----------+----------+-------------------+ SFJ      Full                                                             +---------+---------------+---------+-----------+----------+-------------------+ FV Prox  Full                                                             +---------+---------------+---------+-----------+----------+-------------------+  FV Mid   Full                                                             +---------+---------------+---------+-----------+----------+-------------------+ FV Distal               Yes      Yes                  unable to visualize                                                       well for                                                                  compression         +---------+---------------+---------+-----------+----------+-------------------+ PFV      Full                                                              +---------+---------------+---------+-----------+----------+-------------------+ POP      Full                                                             +---------+---------------+---------+-----------+----------+-------------------+ PTV      Full                                                             +---------+---------------+---------+-----------+----------+-------------------+ PERO                                                  Not visualized      +---------+---------------+---------+-----------+----------+-------------------+   +---------+---------------+---------+-----------+----------+--------------+ LEFT     CompressibilityPhasicitySpontaneityPropertiesThrombus Aging +---------+---------------+---------+-----------+----------+--------------+ CFV      Full           Yes      Yes                                 +---------+---------------+---------+-----------+----------+--------------+ SFJ      Full                                                        +---------+---------------+---------+-----------+----------+--------------+  FV Prox  Full                                                        +---------+---------------+---------+-----------+----------+--------------+ FV Mid   Full                                                        +---------+---------------+---------+-----------+----------+--------------+ FV DistalFull                                                        +---------+---------------+---------+-----------+----------+--------------+ PFV      Full                                                        +---------+---------------+---------+-----------+----------+--------------+ POP      Full           Yes      Yes                                 +---------+---------------+---------+-----------+----------+--------------+ PTV      Full                                                         +---------+---------------+---------+-----------+----------+--------------+ PERO                                                  Not visualized +---------+---------------+---------+-----------+----------+--------------+     Summary: Right: There is no evidence of deep vein thrombosis in the lower extremity. However, portions of this examination were limited- see technologist comments above. No cystic structure found in the popliteal fossa. Left: There is no evidence of deep vein thrombosis in the lower extremity. No cystic structure found in the popliteal fossa.  *See table(s) above for measurements and observations. Electronically signed by Curt Jews MD on 10/27/2019 at 4:02:21 PM.    Final      Antimicrobials:   linezolid dec 24   Subjective: No acute events overnight  Objective: Vitals:   11/06/19 1947 11/07/19 0436 11/07/19 0439 11/07/19 0952  BP: (!) 120/57  (!) 149/73 (!) 151/83  Pulse: 88  84 86  Resp: 20  18   Temp: 98 F (36.7 C)  98.5 F (36.9 C)   TempSrc: Oral  Oral   SpO2: 94%  95%   Weight:  (!) 149.2 kg    Height:        Intake/Output Summary (Last 24 hours) at 11/07/2019 1430 Last data  filed at 11/07/2019 [id number] Gross per 24 hour  Intake 240 ml  Output 1330 ml  Net -1090 ml   Filed Weights   11/05/19 0631 11/06/19 0533 11/07/19 0436  Weight: (!) 148.6 kg (!) 149.2 kg (!) 149.2 kg    Examination:  General exam: Appears calm and comfortable  Respiratory system: Clear to auscultation. Respiratory effort normal. Cardiovascular system: S1 & S2 heard, RRR. No JVD, murmurs, rubs, gallops or clicks. No pedal edema. Gastrointestinal system: Abdomen is nondistended, soft and nontender. No organomegaly or masses felt. Normal bowel sounds heard. Central nervous system: Alert and oriented. No focal neurological deficits. Extremities: Range of motion on right lower extremity secondary to surgical intervention  skin: No rashes, lesions or ulcers right lower  extremity in wrapping did not take down, left lower extremity neurovascularly intact Psychiatry: Judgement and insight appear normal. Mood & affect appropriate. appropriate.     Data Reviewed: I have personally reviewed following labs and imaging studies  CBC: Recent Labs  Lab 11/03/19 0622 11/04/19 0451 11/05/19 0530 11/06/19 0417 11/07/19 0748  WBC 9.5 10.2 8.6 9.3 11.0*  NEUTROABS  --   --   --   --  8.5*  HGB 8.6* 8.4* 8.1* 8.5* 8.5*  HCT 27.5* 26.2* 25.3* 26.9* 26.7*  MCV 89.9 91.0 91.7 92.8 93.0  PLT 435* 454* 483* 535* [id number]*   Basic Metabolic Panel: Recent Labs  Lab 11/01/19 0343 11/03/19 0622 11/04/19 0451 11/05/19 0530 11/06/19 0417 11/07/19 0748  NA 136 137 136 136 135 136  K 5.0 4.4 4.6 4.0 3.8 3.9  CL 97* 96* 97* 96* 95* 97*  CO2 31 31 30 31 29 29   GLUCOSE 137* 126* 127* 118* 115* 119*  BUN 33* 23 23 19 19 17   CREATININE 1.62* 1.27* 1.31* 1.30* 1.28* 1.18  CALCIUM 8.3* 9.0 8.8* 8.7* 8.8* 8.6*  MG 2.3  --   --   --   --   --   PHOS 4.0  --   --   --   --   --    GFR: Estimated Creatinine Clearance: 90 mL/min (by C-G formula based on SCr of 1.18 mg/dL). Liver Function Tests: Recent Labs  Lab 11/01/19 0343  ALBUMIN 2.2*   No results for input(s): LIPASE, AMYLASE in the last 168 hours. No results for input(s): AMMONIA in the last 168 hours. Coagulation Profile: No results for input(s): INR, PROTIME in the last 168 hours. Cardiac Enzymes: Recent Labs  Lab 11/04/19 0451  CKTOTAL 42*   BNP (last 3 results) No results for input(s): PROBNP in the last 8760 hours. HbA1C: No results for input(s): HGBA1C in the last 72 hours. CBG: No results for input(s): GLUCAP in the last 168 hours. Lipid Profile: No results for input(s): CHOL, HDL, LDLCALC, TRIG, CHOLHDL, LDLDIRECT in the last 72 hours. Thyroid Function Tests: No results for input(s): TSH, T4TOTAL, FREET4, T3FREE, THYROIDAB in the last 72 hours. Anemia Panel: No results for input(s):  VITAMINB12, FOLATE, FERRITIN, TIBC, IRON, RETICCTPCT in the last 72 hours. Sepsis Labs: No results for input(s): PROCALCITON, LATICACIDVEN in the last 168 hours.  Recent Results (from the past 240 hour(s))  Surgical pcr screen     Status: None   Collection Time: 10/30/19 11:55 AM   Specimen: Nasal Mucosa; Nasal Swab  Result Value Ref Range Status   MRSA, PCR NEGATIVE NEGATIVE Final   Staphylococcus aureus NEGATIVE NEGATIVE Final    Comment: (NOTE) The Xpert SA Assay (FDA approved for NASAL specimens in  patients 9 years of age and older), is one component of a comprehensive surveillance program. It is not intended to diagnose infection nor to guide or monitor treatment. Performed at [facility], [place] [address], [place], [place] [address]   Aerobic/Anaerobic Culture (surgical/deep wound)     Status: None   Collection Time: 10/30/19  1:39 PM   Specimen: Soft Tissue, Other  Result Value Ref Range Status   Specimen Description TISSUE RIGHT LEG  Final   Special Requests NONE  Final   Gram Stain   Final    RARE WBC PRESENT,BOTH PMN AND MONONUCLEAR NO ORGANISMS SEEN    Culture   Final    No growth aerobically or anaerobically. Performed at [facility], [place] [address], [place], [place] [address]    Report Status 11/04/2019 FINAL  Final         Radiology Studies: No results found.      Scheduled Meds: . bumetanide  2 mg Oral BID  . carvedilol  25 mg Oral BID WC  . docusate sodium  100 mg Oral BID  . linezolid  600 mg Oral Q12H  . senna  1 tablet Oral BID   Continuous Infusions: . sodium chloride 250 mL (10/26/19 1204)  . sodium chloride       LOS: 13 days    Time spent: 35 min     Nicolette Bang, MD [facility]  If 7PM-7AM, please contact night-coverage  11/07/2019, 2:30 PM

## 2019-11-07 NOTE — Plan of Care (Signed)
  Problem: Nutrition: Goal: Adequate nutrition will be maintained Outcome: Completed/Met   Problem: Coping: Goal: Level of anxiety will decrease Outcome: Completed/Met   Problem: Elimination: Goal: Will not experience complications related to bowel motility Outcome: Completed/Met Goal: Will not experience complications related to urinary retention Outcome: Completed/Met   Problem: Pain Managment: Goal: General experience of comfort will improve Outcome: Completed/Met   

## 2019-11-08 ENCOUNTER — Ambulatory Visit: Payer: Medicare Other | Admitting: Family Medicine

## 2019-11-08 ENCOUNTER — Other Ambulatory Visit: Payer: Self-pay | Admitting: Cardiology

## 2019-11-08 LAB — BASIC METABOLIC PANEL
Anion gap: 10 (ref 5–15)
BUN: 21 mg/dL (ref 8–23)
CO2: 31 mmol/L (ref 22–32)
Calcium: 8.9 mg/dL (ref 8.9–10.3)
Chloride: 95 mmol/L — ABNORMAL LOW (ref 98–111)
Creatinine, Ser: 1.4 mg/dL — ABNORMAL HIGH (ref 0.61–1.24)
GFR calc Af Amer: 59 mL/min — ABNORMAL LOW (ref >60)
GFR calc non Af Amer: 51 mL/min — ABNORMAL LOW (ref >60)
Glucose, Bld: 123 mg/dL — ABNORMAL HIGH (ref 70–99)
Potassium: 3.9 mmol/L (ref 3.5–5.1)
Sodium: 136 mmol/L (ref 135–145)

## 2019-11-08 LAB — CBC
HCT: 26.8 % — ABNORMAL LOW (ref 39.0–52.0)
Hemoglobin: 8.4 g/dL — ABNORMAL LOW (ref 13.0–17.0)
MCH: 29.5 pg (ref 26.0–34.0)
MCHC: 31.3 g/dL (ref 30.0–36.0)
MCV: 94 fL (ref 80.0–100.0)
Platelets: 564 10*3/uL — ABNORMAL HIGH (ref 150–400)
RBC: 2.85 MIL/uL — ABNORMAL LOW (ref 4.22–5.81)
RDW: 15.5 % (ref 11.5–15.5)
WBC: 11.7 10*3/uL — ABNORMAL HIGH (ref 4.0–10.5)
nRBC: 0 % (ref 0.0–0.2)

## 2019-11-08 MED ORDER — APIXABAN 5 MG PO TABS
5.0000 mg | ORAL_TABLET | Freq: Two times a day (BID) | ORAL | Status: DC
  Administered 2019-11-09 – 2019-11-11 (×5): 5 mg via ORAL
  Filled 2019-11-08 (×5): qty 1

## 2019-11-08 MED ORDER — BUMETANIDE 2 MG PO TABS
2.0000 mg | ORAL_TABLET | Freq: Two times a day (BID) | ORAL | Status: DC
  Administered 2019-11-08 – 2019-11-09 (×2): 2 mg via ORAL
  Filled 2019-11-08 (×3): qty 1

## 2019-11-08 NOTE — Progress Notes (Signed)
Inpatient Rehabilitation Admissions Coordinator  Noted plans for further surgery on 11/11/2019. I will follow.  Danne Baxter, RN, MSN Rehab Admissions Coordinator [phone] 11/08/2019 11:45 AM

## 2019-11-08 NOTE — Progress Notes (Addendum)
     Subjective:  S/p irrigation and debridement of right lower extremity on 12/26  Patient sleeping soundly upon arrival. Reports no changes over the weekend. He was able to get up and move bed to chair with PT. He notes significant improvement of pain. Denies fever, chills, fatigue, lightheadedness, or shortness of breath.   Objective:   VITALS:   Vitals:   11/07/19 2126 11/07/19 2215 11/08/19 0454 11/08/19 0455  BP: 118/62 (!) 155/59 (!) 149/75   Pulse: 92 60 91   Resp: 20 19 20    Temp: 98.5 F (36.9 C) 98.4 F (36.9 C)  98 F (36.7 C)  TempSrc: Oral Oral Oral Oral  SpO2: 97% 96% 96%   Weight:      Height:       General: Laying in bed currently wearing CPAP. In no acute distress. GI: abdomen soft and non-tender Skin: No drainage from either dressing MSK: EHL and FHL intact. Distal sensation and capillary refill intact.    Lab Results  Component Value Date   WBC 11.0 (H) 11/07/2019   HGB 8.5 (L) 11/07/2019   HCT 26.7 (L) 11/07/2019   MCV 93.0 11/07/2019   PLT 549 (H) 11/07/2019   BMET    Component Value Date/Time   NA 136 11/07/2019 0748   NA 138 08/05/2019 1034   K 3.9 11/07/2019 0748   CL 97 (L) 11/07/2019 0748   CL 101 08/08/2015 0000   CO2 29 11/07/2019 0748   CO2 32 08/08/2015 0000   GLUCOSE 119 (H) 11/07/2019 0748   BUN 17 11/07/2019 0748   BUN 18 08/05/2019 1034   CREATININE 1.18 11/07/2019 0748   CREATININE 1.02 10/08/2016 1144   CALCIUM 8.6 (L) 11/07/2019 0748   CALCIUM 9.3 08/08/2015 0000   GFRNONAA >60 11/07/2019 0748   GFRNONAA 77 10/08/2016 1144   GFRAA >60 11/07/2019 0748   GFRAA 89 10/08/2016 1144     Assessment/Plan: 9 Days Post-Op   Principal Problem:   Necrotizing celluliitis of lower leg ([place]), right Active Problems:   Chronic congestive heart failure (HCC)   Longstanding persistent atrial fibrillation (HCC)   Acute blood loss anemia  Necrotizing celluliitis of right lower leg: - Both dressings (and also dressing for  sacral pressure wound) were changed at approx. 6 am by nursing, night RN reports improvement of all wounds.  - Plastic surgery evaluated patient, now do not plan to take to OR on 1/7 - Since he no longer will be having a repeat debridement and looks to have good clot formation at medial thigh wound, plan to restart eliquis - Linezolid started 12/24 - will defer to Medicine/ID recommendation of continuation - pain control has significantly improved, continue PRN pain control  Ventura Bruns 11/08/2019, 7:23 AM

## 2019-11-08 NOTE — Progress Notes (Signed)
PROGRESS NOTE    Joel Dominguez  [id number] DOB: 04-24-1951 DOA: 10/24/2019 PCP: Mellody Dance, DO   Brief Narrative: 69 year old male with history of A. fib on Eliquis, AAS, diastolic CHF, moderate MR, morbid obesity with BMI 47, obese,OSA on CPAP presents with right leg leg necrotizing cellulitis. On presentation he was febrile, leukocytosis 20K underwent excisional debridement of the skin and subcutaneous tissue of the right lower extremity by Ortho 12/26.Patient has been on antibiotics, wound culture results no growth so far. Nasal swab with MRSA. He underwent excisional debridement 12/26.Had to stop anti-coag tx due to sig bleeding from wound dressing changes Due for more surgery 1/7 - plastics  Subjective:  On bedside chair, on RA dressing changed by surgery this am-see pic No fever overnight, on CPAP during night,.  Assessment & Plan:  Necrotizing celluliitis of right lower extremity with severe right leg cellulitis:S/p excisional debridement of the skin and subcutaneous tissue of the right lower extremity by Ortho 12/26.  Ortho and plastics following, reviewed the wound imaging this morning and discussed with plastic PA and they are not going to have additional surgery at this time since wound is healing nicely and okay to resume Eliquis.  Continue on current dressing.  Continue linezolid.Aerobic/anaerobic culture from the wound: Gram stain with rare WBC, wound culture no growth so far. MRSA nasal negative. Blood cultures negative.  Bleeding from the right lower extremity causing acute blood loss anemia: Hemoglobin at this time is stable.  No more bleeding wound is healing.  Okay to resume Eliquis per plastic PA will start from morning. Recent Labs  Lab 11/04/19 0451 11/05/19 0530 11/06/19 0417 11/07/19 0748 11/08/19 0830  HGB 8.4* 8.1* 8.5* 8.5* 8.4*  HCT 26.2* 25.3* 26.9* 26.7* 26.8*   AKI on CKD stage IIIa: Creatinine ranging 1.1-1.4.  Monitor. Baseline  creatinine 1.2  Recent Labs  Lab 11/04/19 0451 11/05/19 0530 11/06/19 0417 11/07/19 0748 11/08/19 0830  BUN 23 19 19 17 21   CREATININE 1.31* 1.30* 1.28* 1.18 1.40*   Cronic congestive heart failure/pulmonary hypertension: Compensated.  Continue Bumex. If Creatinine is further uptrending will cut down the Bumex.  Essential hypertension: Blood pressure well controlled.Continue Coreg   Persistent atrial fibrillation: Intermittently in RVR.  Continues Coreg.  Resuming Eliquis as okay with plastic PA.  OSA on CPAP cont cpap qhs  Pressure Ulcer: Pressure Injury 10/28/19 Buttocks Right;Left;Mid Deep Tissue Pressure Injury - Purple or maroon localized area of discolored intact skin or blood-filled blister due to damage of underlying soft tissue from pressure and/or shear.  (Active)  10/28/19 0800  Location: Buttocks  Location Orientation: Right;Left;Mid  Staging: Deep Tissue Pressure Injury - Purple or maroon localized area of discolored intact skin or blood-filled blister due to damage of underlying soft tissue from pressure and/or shear.  Wound Description (Comments): -- (red and purple R&L buttocks, crack in the middle)  Present on Admission: -- (unsure)    Body mass index is 44.62 kg/m.    DVT prophylaxis: SCD Code Status:full  Family Communication: plan of care discussed with patient and wife at bedside. Disposition Plan: Remains inpatient pending clinical improvement.   Consultants: Vascular/plastics Procedures:excisional debridement 12/26 Microbiology: Wound culture 12/26 rare wbc present, no organism no growth. MRSA PCR screening negative.  Blood culture 12/20 no growth so far.   Antimicrobials: Anti-infectives (From admission, onward)   Start     Dose/Rate Route Frequency Ordered Stop   10/28/19 1800  linezolid (ZYVOX) tablet 600 mg     600 mg Oral  Every 12 hours 10/28/19 1509     10/28/19 1515  linezolid (ZYVOX) tablet 600 mg  Status:  Discontinued     600 mg  Oral Every 12 hours 10/28/19 1500 10/28/19 1501   10/28/19 1430  linezolid (ZYVOX) IVPB 600 mg  Status:  Discontinued     600 mg 300 mL/hr over 60 Minutes Intravenous Every 12 hours 10/28/19 1425 10/28/19 1500   10/26/19 1200  ampicillin (OMNIPEN) 2 g in sodium chloride 0.9 % 100 mL IVPB  Status:  Discontinued     2 g 300 mL/hr over 20 Minutes Intravenous Every 4 hours 10/26/19 1009 10/28/19 1421   10/25/19 1500  vancomycin (VANCOREADY) IVPB 1500 mg/300 mL  Status:  Discontinued     1,500 mg 150 mL/hr over 120 Minutes Intravenous Every 24 hours 10/24/19 1354 10/26/19 1009   10/24/19 2200  aztreonam (AZACTAM) 2 g in sodium chloride 0.9 % 100 mL IVPB  Status:  Discontinued     2 g 200 mL/hr over 30 Minutes Intravenous Every 8 hours 10/24/19 1354 10/25/19 0956   10/24/19 1300  vancomycin (VANCOCIN) 2,500 mg in sodium chloride 0.9 % 500 mL IVPB     2,500 mg 250 mL/hr over 120 Minutes Intravenous  Once 10/24/19 1259 10/24/19 1642   10/24/19 1245  vancomycin (VANCOCIN) IVPB 1000 mg/200 mL premix  Status:  Discontinued     1,000 mg 200 mL/hr over 60 Minutes Intravenous  Once 10/24/19 1240 10/24/19 1259   10/24/19 1245  aztreonam (AZACTAM) 2 g in sodium chloride 0.9 % 100 mL IVPB     2 g 200 mL/hr over 30 Minutes Intravenous  Once 10/24/19 1240 10/24/19 1819       Objective: Vitals:   11/07/19 2126 11/07/19 2215 11/08/19 0454 11/08/19 0455  BP: 118/62 (!) 155/59 (!) 149/75   Pulse: 92 60 91   Resp: 20 19 20    Temp: 98.5 F (36.9 C) 98.4 F (36.9 C)  98 F (36.7 C)  TempSrc: Oral Oral Oral Oral  SpO2: 97% 96% 96%   Weight:      Height:        Intake/Output Summary (Last 24 hours) at 11/08/2019 0803 Last data filed at 11/08/2019 0500 Gross per 24 hour  Intake 440 ml  Output 1455 ml  Net -1015 ml   Filed Weights   11/05/19 0631 11/06/19 0533 11/07/19 0436  Weight: (!) 148.6 kg (!) 149.2 kg (!) 149.2 kg   Weight change:   Body mass index is 44.62 kg/m.  Intake/Output from  previous day: 01/03 0701 - 01/04 0700 In: 440 [P.O.:440] Out: 1455 [Urine:1455] Intake/Output this shift: No intake/output data recorded.  Examination:  General exam: AAOx3,NAD,Weak appearing. HEENT:Oral mucosa moist, Ear/Nose WNL grossly, dentition normal. Respiratory system: Diminished at the base,no wheezing or crackles,no use of accessory muscle. Cardiovascular system:S1 & S2 +, No JVD,. Gastrointestinal system: Abdomen soft, NT,ND, BS+ Nervous System:Alert,awake, moving extremities and grossly nonfocal Extremities: No edema, distal peripheral pulses palpable.Rt Leg with 2 areas of dressing- see pic.  Skin: No rashes,no icterus. MSK: Normal muscle bulk,tone, power.         Medications:  Scheduled Meds: . bumetanide  2 mg Oral BID  . carvedilol  25 mg Oral BID WC  . docusate sodium  100 mg Oral BID  . linezolid  600 mg Oral Q12H  . senna  1 tablet Oral BID   Continuous Infusions: . sodium chloride 250 mL (10/26/19 1204)  . sodium chloride  Data Reviewed: I have personally reviewed following labs and imaging studies  CBC: Recent Labs  Lab 11/03/19 0622 11/04/19 0451 11/05/19 0530 11/06/19 0417 11/07/19 0748  WBC 9.5 10.2 8.6 9.3 11.0*  NEUTROABS  --   --   --   --  8.5*  HGB 8.6* 8.4* 8.1* 8.5* 8.5*  HCT 27.5* 26.2* 25.3* 26.9* 26.7*  MCV 89.9 91.0 91.7 92.8 93.0  PLT 435* 454* 483* 535* 123XX123*   Basic Metabolic Panel: Recent Labs  Lab 11/03/19 0622 11/04/19 0451 11/05/19 0530 11/06/19 0417 11/07/19 0748  NA 137 136 136 135 136  K 4.4 4.6 4.0 3.8 3.9  CL 96* 97* 96* 95* 97*  CO2 31 30 31 29 29   GLUCOSE 126* 127* 118* 115* 119*  BUN 23 23 19 19 17   CREATININE 1.27* 1.31* 1.30* 1.28* 1.18  CALCIUM 9.0 8.8* 8.7* 8.8* 8.6*   GFR: Estimated Creatinine Clearance: 90 mL/min (by C-G formula based on SCr of 1.18 mg/dL). Liver Function Tests: No results for input(s): AST, ALT, ALKPHOS, BILITOT, PROT, ALBUMIN in the last 168 hours. No results for  input(s): LIPASE, AMYLASE in the last 168 hours. No results for input(s): AMMONIA in the last 168 hours. Coagulation Profile: No results for input(s): INR, PROTIME in the last 168 hours. Cardiac Enzymes: Recent Labs  Lab 11/04/19 0451  CKTOTAL 42*   BNP (last 3 results) No results for input(s): PROBNP in the last 8760 hours. HbA1C: No results for input(s): HGBA1C in the last 72 hours. CBG: No results for input(s): GLUCAP in the last 168 hours. Lipid Profile: No results for input(s): CHOL, HDL, LDLCALC, TRIG, CHOLHDL, LDLDIRECT in the last 72 hours. Thyroid Function Tests: No results for input(s): TSH, T4TOTAL, FREET4, T3FREE, THYROIDAB in the last 72 hours. Anemia Panel: No results for input(s): VITAMINB12, FOLATE, FERRITIN, TIBC, IRON, RETICCTPCT in the last 72 hours. Sepsis Labs: No results for input(s): PROCALCITON, LATICACIDVEN in the last 168 hours.  Recent Results (from the past 240 hour(s))  Surgical pcr screen     Status: None   Collection Time: 10/30/19 11:55 AM   Specimen: Nasal Mucosa; Nasal Swab  Result Value Ref Range Status   MRSA, PCR NEGATIVE NEGATIVE Final   Staphylococcus aureus NEGATIVE NEGATIVE Final    Comment: (NOTE) The Xpert SA Assay (FDA approved for NASAL specimens in patients 74 years of age and older), is one component of a comprehensive surveillance program. It is not intended to diagnose infection nor to guide or monitor treatment. Performed at [facility], [place] [address], [place], [place] [address]   Aerobic/Anaerobic Culture (surgical/deep wound)     Status: None   Collection Time: 10/30/19  1:39 PM   Specimen: Soft Tissue, Other  Result Value Ref Range Status   Specimen Description TISSUE RIGHT LEG  Final   Special Requests NONE  Final   Gram Stain   Final    RARE WBC PRESENT,BOTH PMN AND MONONUCLEAR NO ORGANISMS SEEN    Culture   Final    No growth aerobically or anaerobically. Performed at [facility], [place]  [address] [place], [place] [address]    Report Status 11/04/2019 FINAL  Final      Radiology Studies: No results found.    LOS: 14 days   Time spent: More than 50% of that time was spent in counseling and/or coordination of care.  Antonieta Pert, MD [facility]  11/08/2019, 8:03 AM

## 2019-11-08 NOTE — Care Management Important Message (Signed)
Important Message  Patient Details  Name: Joel Dominguez MRN: [id number] Date of Birth: 1951/02/20   Medicare Important Message Given:  Yes     Shelda Altes 11/08/2019, 1:41 PM

## 2019-11-08 NOTE — Progress Notes (Signed)
Physical Therapy Treatment Patient Details Name: Joel Dominguez MRN: [id number] DOB: 1951-11-03 Today's Date: 11/08/2019    History of Present Illness 69 year old male with history of A. fib on Eliquis, AAS, diastolic CHF, moderate MR, morbid obesity and OSA presented with right leg pain and swelling with blisters, admitted for RLE cellulitis. s/p excisional debridement 12/26.    PT Comments    Patient progressing slowly towards PT goals. Tolerated short distance ambulation to bathroom with use of RW and Min guard assist. After attempting to have a BM, pt with episode of lightheadedness, diaphoresis, pallor with drop in BP limiting further mobility. Requires less assist to stand from all surfaces. Pt highly motivated to improve strength and mobility.   Sitting BP 102/61 Sitting post walk 84/46 Sitting after 2-3 mins 122/51 RN notified of above episode. Requested mobility tech work with pt later this afternoon when feeling better as increasing activity will likely decrease hypotensive episodes and improve tolerance to upright/mobility. Will follow.   Follow Up Recommendations  CIR;Supervision/Assistance - 24 hour     Equipment Recommendations  Rolling walker with 5" wheels    Recommendations for Other Services       Precautions / Restrictions Precautions Precautions: Fall Precaution Comments: drop in BP with activity Restrictions Weight Bearing Restrictions: No    Mobility  Bed Mobility               General bed mobility comments: Up in chair upon PT arrival.  Transfers Overall transfer level: Needs assistance Equipment used: Rolling walker (2 wheeled) Transfers: Sit to/from Stand Sit to Stand: Min guard;Min assist         General transfer comment: Min guard-Min A to stand from chair x2, from toilet x2 with pt pulling up on rail for support.  likely vasovagal response after using bathroom resulting in diaphoresis, pallor, lightheadedness. Drop in BP from 102/61 to  84/46.  Ambulation/Gait Ambulation/Gait assistance: Min guard Gait Distance (Feet): 15 Feet Assistive device: Rolling walker (2 wheeled) Gait Pattern/deviations: Step-through pattern;Trunk flexed;Wide base of support;Decreased step length - right Gait velocity: reduced   General Gait Details: Slow, mildly unsteady gait with flexed trunk, reaching for counter at times. Difficulty advancing RLE, hip circumduction to advance wtih limited knee flexion. Limited due to hypotensive episode.   Stairs             Wheelchair Mobility    Modified Rankin (Stroke Patients Only)       Balance Overall balance assessment: Needs assistance Sitting-balance support: Feet supported;No upper extremity supported Sitting balance-Leahy Scale: Fair     Standing balance support: During functional activity Standing balance-Leahy Scale: Poor Standing balance comment: Requires UE support in standing.                            Cognition Arousal/Alertness: Awake/alert Behavior During Therapy: WFL for tasks assessed/performed Overall Cognitive Status: Within Functional Limits for tasks assessed                                        Exercises      General Comments General comments (skin integrity, edema, etc.): Wife present during session. Sitting BP 102/61, sitting post walk 84/46, sitting after 2-3 mins and ankle pumps 122/51.      Pertinent Vitals/Pain Pain Assessment: Faces Faces Pain Scale: Hurts a little bit Pain Location: RLE Pain Descriptors /  Indicators: Sore Pain Intervention(s): Repositioned;Monitored during session    Home Living                      Prior Function            PT Goals (current goals can now be found in the care plan section) Progress towards PT goals: Progressing toward goals(slowly)    Frequency    Min 3X/week      PT Plan Current plan remains appropriate    Co-evaluation              AM-PAC PT  "6 Clicks" Mobility   Outcome Measure  Help needed turning from your back to your side while in a flat bed without using bedrails?: A Little Help needed moving from lying on your back to sitting on the side of a flat bed without using bedrails?: A Little Help needed moving to and from a bed to a chair (including a wheelchair)?: A Little Help needed standing up from a chair using your arms (e.g., wheelchair or bedside chair)?: A Little Help needed to walk in hospital room?: A Little Help needed climbing 3-5 steps with a railing? : Total 6 Click Score: 16    End of Session   Activity Tolerance: Treatment limited secondary to medical complications (Comment)(hypotensive episode) Patient left: in chair;with call bell/phone within reach;with family/visitor present Nurse Communication: Mobility status;Other (comment)(BP drop) PT Visit Diagnosis: Muscle weakness (generalized) (M62.81)     Time: [id number] PT Time Calculation (min) (ACUTE ONLY): 39 min  Charges:  $Gait Training: 8-22 mins $Therapeutic Activity: 23-37 mins                     Marisa Severin, PT, DPT Acute Rehabilitation Services Pager [phone] Office [place] 11/08/2019, 11:48 AM

## 2019-11-08 NOTE — Progress Notes (Signed)
Subjective: Resting in bed today on evaluation, doing well.  No complaints.  He reports he has been doing well with walking and therapy.  No fever, chills, nausea, vomiting.  Pain in his right lower extremity is manageable.  Minimal pain today on exam with dressing change.  His wounds are healing well with local wound care.  Objective: Vital signs in last 24 hours: Temp:  [98 F (36.7 C)-98.5 F (36.9 C)] 98 F (36.7 C) (01/04 0455) Pulse Rate:  [60-92] 91 (01/04 0454) Resp:  [19-20] 20 (01/04 0454) BP: (113-155)/(59-83) 149/75 (01/04 0454) SpO2:  [95 %-97 %] 96 % (01/04 0454) Last BM Date: 11/06/19  Intake/Output from previous day: 01/03 0701 - 01/04 0700 In: 440 [P.O.:440] Out: 1455 [Urine:1455] Intake/Output this shift: No intake/output data recorded.  General appearance: alert, cooperative, no distress and Resting in bed Resp: Unlabored Extremities: Left lower extremity without any abnormality.   No erythema noted, no drainage noted, mild ttp along lateral aspect of lower wounds. No ttp of thigh wound. No fluctuance noted. Scabs beginning to heal and fall off.   Granulation tissue noted within lower wounds, no purulence noted.   Pulses: 2+ and symmetric Neurologic: Grossly normal  Right distal LE     Right medial thigh     Lab Results:  CBC Latest Ref Rng & Units 11/07/2019 11/06/2019 11/05/2019  WBC 4.0 - 10.5 K/uL 11.0(H) 9.3 8.6  Hemoglobin 13.0 - 17.0 g/dL 8.5(L) 8.5(L) 8.1(L)  Hematocrit 39.0 - 52.0 % 26.7(L) 26.9(L) 25.3(L)  Platelets 150 - 400 K/uL 549(H) 535(H) 483(H)    BMET Recent Labs    11/06/19 0417 11/07/19 0748  NA 135 136  K 3.8 3.9  CL 95* 97*  CO2 29 29  GLUCOSE 115* 119*  BUN 19 17  CREATININE 1.28* 1.18  CALCIUM 8.8* 8.6*   PT/INR No results for input(s): LABPROT, INR in the last 72 hours. ABG No results for input(s): PHART, HCO3 in the last 72 hours.  Invalid input(s): PCO2, PO2  Studies/Results: No results  found.  Anti-infectives: Anti-infectives (From admission, onward)   Start     Dose/Rate Route Frequency Ordered Stop   10/28/19 1800  linezolid (ZYVOX) tablet 600 mg     600 mg Oral Every 12 hours 10/28/19 1509     10/28/19 1515  linezolid (ZYVOX) tablet 600 mg  Status:  Discontinued     600 mg Oral Every 12 hours 10/28/19 1500 10/28/19 1501   10/28/19 1430  linezolid (ZYVOX) IVPB 600 mg  Status:  Discontinued     600 mg 300 mL/hr over 60 Minutes Intravenous Every 12 hours 10/28/19 1425 10/28/19 1500   10/26/19 1200  ampicillin (OMNIPEN) 2 g in sodium chloride 0.9 % 100 mL IVPB  Status:  Discontinued     2 g 300 mL/hr over 20 Minutes Intravenous Every 4 hours 10/26/19 1009 10/28/19 1421   10/25/19 1500  vancomycin (VANCOREADY) IVPB 1500 mg/300 mL  Status:  Discontinued     1,500 mg 150 mL/hr over 120 Minutes Intravenous Every 24 hours 10/24/19 1354 10/26/19 1009   10/24/19 2200  aztreonam (AZACTAM) 2 g in sodium chloride 0.9 % 100 mL IVPB  Status:  Discontinued     2 g 200 mL/hr over 30 Minutes Intravenous Every 8 hours 10/24/19 1354 10/25/19 0956   10/24/19 1300  vancomycin (VANCOCIN) 2,500 mg in sodium chloride 0.9 % 500 mL IVPB     2,500 mg 250 mL/hr over 120 Minutes Intravenous  Once 10/24/19  1259 10/24/19 1642   10/24/19 1245  vancomycin (VANCOCIN) IVPB 1000 mg/200 mL premix  Status:  Discontinued     1,000 mg 200 mL/hr over 60 Minutes Intravenous  Once 10/24/19 1240 10/24/19 1259   10/24/19 1245  aztreonam (AZACTAM) 2 g in sodium chloride 0.9 % 100 mL IVPB     2 g 200 mL/hr over 30 Minutes Intravenous  Once 10/24/19 1240 10/24/19 1819      Assessment/Plan:   Right lower extremity wounds:  Continue with optimization of nutrition via protein shakes and healthy eating. May benefit from multivitamin and vitamin C  Initially, plan was to debride with placement of wound matrix this Thursday 11/11/2019, but on evaluation today patient's right lower extremity wounds are doing  well.  There is no sign of infection.  There is no purulence.  The skin is not sloughing.  He has been doing very well with local wound care.  At this time would recommend continued local wound care per Ortho recommendations. (Xeroform gauze, ABD, Kerlix, Ace wrap).  No surgical management planned at this time.  Will update patient and family.  Can restart Eliquis from stance of plastic surgery.   LOS: 14 days    Charlies Constable, PA-C 11/08/2019

## 2019-11-09 ENCOUNTER — Encounter (HOSPITAL_COMMUNITY): Payer: Self-pay | Admitting: Internal Medicine

## 2019-11-09 ENCOUNTER — Telehealth: Payer: Self-pay | Admitting: Family Medicine

## 2019-11-09 LAB — COMPREHENSIVE METABOLIC PANEL
ALT: 20 U/L (ref 0–44)
AST: 16 U/L (ref 15–41)
Albumin: 2.7 g/dL — ABNORMAL LOW (ref 3.5–5.0)
Alkaline Phosphatase: 65 U/L (ref 38–126)
Anion gap: 10 (ref 5–15)
BUN: 23 mg/dL (ref 8–23)
CO2: 30 mmol/L (ref 22–32)
Calcium: 8.8 mg/dL — ABNORMAL LOW (ref 8.9–10.3)
Chloride: 95 mmol/L — ABNORMAL LOW (ref 98–111)
Creatinine, Ser: 1.4 mg/dL — ABNORMAL HIGH (ref 0.61–1.24)
GFR calc Af Amer: 59 mL/min — ABNORMAL LOW (ref >60)
GFR calc non Af Amer: 51 mL/min — ABNORMAL LOW (ref >60)
Glucose, Bld: 128 mg/dL — ABNORMAL HIGH (ref 70–99)
Potassium: 3.9 mmol/L (ref 3.5–5.1)
Sodium: 135 mmol/L (ref 135–145)
Total Bilirubin: 0.7 mg/dL (ref 0.3–1.2)
Total Protein: 8.1 g/dL (ref 6.5–8.1)

## 2019-11-09 LAB — CBC
HCT: 26.7 % — ABNORMAL LOW (ref 39.0–52.0)
Hemoglobin: 8.4 g/dL — ABNORMAL LOW (ref 13.0–17.0)
MCH: 29.6 pg (ref 26.0–34.0)
MCHC: 31.5 g/dL (ref 30.0–36.0)
MCV: 94 fL (ref 80.0–100.0)
Platelets: 574 10*3/uL — ABNORMAL HIGH (ref 150–400)
RBC: 2.84 MIL/uL — ABNORMAL LOW (ref 4.22–5.81)
RDW: 15.7 % — ABNORMAL HIGH (ref 11.5–15.5)
WBC: 9.8 10*3/uL (ref 4.0–10.5)
nRBC: 0 % (ref 0.0–0.2)

## 2019-11-09 MED ORDER — SODIUM CHLORIDE 0.9 % IV SOLN: INTRAVENOUS | Status: AC

## 2019-11-09 MED ORDER — CARVEDILOL 6.25 MG PO TABS
6.2500 mg | ORAL_TABLET | Freq: Two times a day (BID) | ORAL | Status: DC
  Administered 2019-11-09 – 2019-11-11 (×4): 6.25 mg via ORAL
  Filled 2019-11-09 (×4): qty 1

## 2019-11-09 MED ORDER — SODIUM CHLORIDE 0.9 % IV BOLUS
1000.0000 mL | Freq: Once | INTRAVENOUS | Status: AC
  Administered 2019-11-09: 15:00:00 1000 mL via INTRAVENOUS

## 2019-11-09 MED ORDER — BUMETANIDE 1 MG PO TABS
1.0000 mg | ORAL_TABLET | Freq: Two times a day (BID) | ORAL | Status: DC
  Filled 2019-11-09: qty 1

## 2019-11-09 MED ORDER — LABETALOL HCL 5 MG/ML IV SOLN: 5.0000 mg | INTRAVENOUS | Status: DC | PRN

## 2019-11-09 MED ORDER — BUMETANIDE 1 MG PO TABS
1.0000 mg | ORAL_TABLET | Freq: Two times a day (BID) | ORAL | Status: DC
  Administered 2019-11-10: 09:00:00 1 mg via ORAL
  Filled 2019-11-09 (×3): qty 1

## 2019-11-09 NOTE — Progress Notes (Signed)
Pt has home CPAP with him. Pt is able to place himself on without difficulty. RT will continue to monitor.

## 2019-11-09 NOTE — Progress Notes (Signed)
Occupational Therapy Treatment Patient Details Name: Joel Dominguez MRN: [id number] DOB: [birth year] Today's Date: 11/09/2019    History of present illness 69 year old male with history of A. fib on Eliquis, AAS, diastolic CHF, moderate MR, morbid obesity and OSA presented with right leg pain and swelling with blisters, admitted for RLE cellulitis. s/p excisional debridement 12/26.   OT comments  This 69 yo male very motivated to get up and get moving with me this session. Reports that he has been up since about 6:00am because he was feeling so good he wanted to get up then. He reports that he at that time he got up with A to bathroom and back to recliner without issues with feeling dizzy and felt great. In to see him now and pt in recliner checked BP 119/62 HR 83 had pt stand and at 35 seconds he said he was feeling dizzy BP 85/44 HR 101. Had him do some exercises with legs and arms with BP 104/64 HR 78 post this. Tried getting up and walking 5 feet from recliner to chair and he felt fine but once he sat he said he felt dizzy and BP 79/70 HR 93.   Follow Up Recommendations  CIR;Supervision/Assistance - 24 hour    Equipment Recommendations  Other (comment)(TBD next venue)       Precautions / Restrictions Precautions Precautions: Fall Precaution Comments: drop in BP with activity Restrictions Weight Bearing Restrictions: No       Mobility Bed Mobility               General bed mobility comments: Up in chair upon OT arrival.  Transfers Overall transfer level: Needs assistance Equipment used: Rolling walker (2 wheeled) Transfers: Sit to/from Stand Sit to Stand: Min assist         General transfer comment: increased time    Balance Overall balance assessment: Needs assistance Sitting-balance support: No upper extremity supported;Feet supported Sitting balance-Leahy Scale: Good     Standing balance support: Bilateral upper extremity supported Standing balance-Leahy  Scale: Poor Standing balance comment: reliant on RW                           ADL either performed or assessed with clinical judgement   ADL Overall ADL's : Needs assistance/impaired                         Toilet Transfer: Minimal assistance;Ambulation;RW Toilet Transfer Details (indicate cue type and reason): recliner>chair 5 feet away (x2)                 Vision Patient Visual Report: No change from baseline            Cognition Arousal/Alertness: Awake/alert Behavior During Therapy: WFL for tasks assessed/performed Overall Cognitive Status: Within Functional Limits for tasks assessed                                                     Pertinent Vitals/ Pain       Pain Assessment: No/denies pain         Frequency  Min 2X/week        Progress Toward Goals  OT Goals(current goals can now be found in the care plan section)  Progress towards OT goals: Not progressing toward  goals - comment(due to drop in BP issues this session)  Acute Rehab OT Goals Patient Stated Goal: To return to prior level of function OT Goal Formulation: With patient Time For Goal Achievement: 11/11/19 Potential to Achieve Goals: Good  Plan Discharge plan remains appropriate       AM-PAC OT "6 Clicks" Daily Activity     Outcome Measure   Help from another person eating meals?: None Help from another person taking care of personal grooming?: A Little Help from another person toileting, which includes using toliet, bedpan, or urinal?: A Lot Help from another person bathing (including washing, rinsing, drying)?: A Lot Help from another person to put on and taking off regular upper body clothing?: A Little Help from another person to put on and taking off regular lower body clothing?: A Lot 6 Click Score: 16    End of Session Equipment Utilized During Treatment: Rolling walker;Gait belt  OT Visit Diagnosis: Unsteadiness on feet  (R26.81);Other abnormalities of gait and mobility (R26.89)   Activity Tolerance (limited by drop in BP)   Patient Left in chair;with call bell/phone within reach;with family/visitor present(wife in room)   Nurse Communication (BP drop)        Time: [id number] OT Time Calculation (min): 37 min  Charges: OT General Charges $OT Visit: 1 Visit OT Treatments $Self Care/Home Management : 23-37 mins   [facility] OTR/L Acute Rehab Services Pager [phone] Office [phone]  '   11/09/2019, 12:37 PM

## 2019-11-09 NOTE — Progress Notes (Signed)
PROGRESS NOTE    Joel Dominguez  [id number] DOB: January 06, 1951 DOA: 10/24/2019 PCP: Mellody Dance, DO   Brief Narrative: 69 year old male with history of A. fib on Eliquis, AAS, diastolic CHF, moderate MR, morbid obesity with BMI 47, obese,OSA on CPAP presents with right leg leg necrotizing cellulitis. On presentation he was febrile, leukocytosis 20K underwent excisional debridement of the skin and subcutaneous tissue of the right lower extremity by Ortho 12/26.Patient has been on antibiotics, wound culture results no growth so far. Nasal swab with MRSA. He underwent excisional debridement 12/26.Had to stop anti-coag tx due to sig bleeding from wound dressing changes Due for more surgery 1/7 - plastics  Subjective:  Seen this morning patient is resting comfortably in the bedside chair. Dizzy with standing up, On RA.  Assessment & Plan:  Necrotizing celluliitis of right lower extremity with severe right leg cellulitis:S/p excisional debridement of the skin and subcutaneous tissue of the right lower extremity by Ortho 12/26.  Ortho and plastics following, reviewed the wound imaging this morning and discussed with plastic PA and they are not going to have additional surgery at this time since wound is healing nicely and okay to resume Eliquis.  Continue on current dressing as per surgery.Continue linezolid.Aerobic/anaerobic culture from the wound: Gram stain with rare WBC, wound culture no growth so far. MRSA nasal negative. Blood cultures negative.  Bleeding from the right lower extremity causing acute blood loss anemia: Hemoglobin at this time is stable. No more bleeding wound is healing.  Okay to resume Eliquis per plastic-started 11/09/19 Recent Labs  Lab 11/05/19 0530 11/06/19 0417 11/07/19 0748 11/08/19 0830 11/09/19 0425  HGB 8.1* 8.5* 8.5* 8.4* 8.4*  HCT 25.3* 26.9* 26.7* 26.8* 26.7*   AKI on CKD stage IIIa: Creatinine ranging 1.1-1.4.  Monitor. Baseline creatinine  1.2  Recent Labs  Lab 11/05/19 0530 11/06/19 0417 11/07/19 0748 11/08/19 0830 11/09/19 0425  BUN 19 19 17 21 23   CREATININE 1.30* 1.28* 1.18 1.40* 1.40*   Orthostatic hypotension/dizziness:Patient noted to be dizzy during PT/OT,blood pressure low up to 79/70 on standing. Will give him IVF NSS,hold his Bumex.He is on Coreg 25 twice daily and I will cut down to 6.25 twice daily for now and reassess.His weight is significantly down to 329 pound, on admission was in 345-364 lb.LVEF in TTE June 30, 2019 was 50 to 50%.  Cronic congestive heart failure/pulmonary hypertension: Compensated.Given orthostatic hypotension we will hold bumex today and resume at lower dose probably 1 mg once or twice a day from tomorrow  Essential hypertension: Blood pressure orthostatic- cut down coreg to 6.25 mg bid. His telmisartan is on hold.   Persistent atrial fibrillation: Intermittently in RVR.  Continues Coreg.  Resuming Eliquis as okay with plastic PA.  OSA on CPAP cont cpap qhs  Pressure Ulcer: Pressure Injury 10/28/19 Buttocks Right;Left;Mid Deep Tissue Pressure Injury - Purple or maroon localized area of discolored intact skin or blood-filled blister due to damage of underlying soft tissue from pressure and/or shear.  (Active)  10/28/19 0800  Location: Buttocks  Location Orientation: Right;Left;Mid  Staging: Deep Tissue Pressure Injury - Purple or maroon localized area of discolored intact skin or blood-filled blister due to damage of underlying soft tissue from pressure and/or shear.  Wound Description (Comments): -- (red and purple R&L buttocks, crack in the middle)  Present on Admission: -- (unsure)    Body mass index is 44.62 kg/m.   DVT prophylaxis:SCD Code Status:full.  Family Communication: plan of care discussed with patient and wife.  Disposition Plan:Remains inpatient pending clearance from surgery and once no more orthostatics.  Hopefully discharge to inpatient rehab in the next few  days  Consultants: Vascular/plastics Procedures:excisional debridement 12/26 Microbiology: Wound culture 12/26 rare wbc present, no organism no growth. MRSA PCR screening negative.  Blood culture 12/20 no growth so far.   Antimicrobials: Anti-infectives (From admission, onward)   Start     Dose/Rate Route Frequency Ordered Stop   10/28/19 1800  linezolid (ZYVOX) tablet 600 mg     600 mg Oral Every 12 hours 10/28/19 1509     10/28/19 1515  linezolid (ZYVOX) tablet 600 mg  Status:  Discontinued     600 mg Oral Every 12 hours 10/28/19 1500 10/28/19 1501   10/28/19 1430  linezolid (ZYVOX) IVPB 600 mg  Status:  Discontinued     600 mg 300 mL/hr over 60 Minutes Intravenous Every 12 hours 10/28/19 1425 10/28/19 1500   10/26/19 1200  ampicillin (OMNIPEN) 2 g in sodium chloride 0.9 % 100 mL IVPB  Status:  Discontinued     2 g 300 mL/hr over 20 Minutes Intravenous Every 4 hours 10/26/19 1009 10/28/19 1421   10/25/19 1500  vancomycin (VANCOREADY) IVPB 1500 mg/300 mL  Status:  Discontinued     1,500 mg 150 mL/hr over 120 Minutes Intravenous Every 24 hours 10/24/19 1354 10/26/19 1009   10/24/19 2200  aztreonam (AZACTAM) 2 g in sodium chloride 0.9 % 100 mL IVPB  Status:  Discontinued     2 g 200 mL/hr over 30 Minutes Intravenous Every 8 hours 10/24/19 1354 10/25/19 0956   10/24/19 1300  vancomycin (VANCOCIN) 2,500 mg in sodium chloride 0.9 % 500 mL IVPB     2,500 mg 250 mL/hr over 120 Minutes Intravenous  Once 10/24/19 1259 10/24/19 1642   10/24/19 1245  vancomycin (VANCOCIN) IVPB 1000 mg/200 mL premix  Status:  Discontinued     1,000 mg 200 mL/hr over 60 Minutes Intravenous  Once 10/24/19 1240 10/24/19 1259   10/24/19 1245  aztreonam (AZACTAM) 2 g in sodium chloride 0.9 % 100 mL IVPB     2 g 200 mL/hr over 30 Minutes Intravenous  Once 10/24/19 1240 10/24/19 1819       Objective: Vitals:   11/08/19 2019 11/09/19 0421 11/09/19 0957 11/09/19 1216  BP: (!) 141/64 (!) 165/83 120/76 120/66    Pulse: 84 89 94 81  Resp: 18 18 18 18   Temp: 98.8 F (37.1 C) 97.9 F (36.6 C) 98.1 F (36.7 C) 97.7 F (36.5 C)  TempSrc: Oral Oral Oral Oral  SpO2: 95% 96% 95% 98%  Weight:      Height:        Intake/Output Summary (Last 24 hours) at 11/09/2019 1402 Last data filed at 11/09/2019 1300 Gross per 24 hour  Intake 720 ml  Output 1250 ml  Net -530 ml   Filed Weights   11/06/19 0533 11/07/19 0436  Weight: (!) 149.2 kg (!) 149.2 kg   Weight change:   Body mass index is 44.62 kg/m.  Intake/Output from previous day: 01/04 0701 - 01/05 0700 In: 700 [P.O.:700] Out: 1300 [Urine:1300] Intake/Output this shift: Total I/O In: 480 [P.O.:480] Out: 500 [Urine:500]  Examination: General exam: AAO, obese, NAD, weak/frail HEENT:Oral mucosa moist, Ear/Nose WNL grossly, dentition normal. Respiratory system: Diminished at the base,no wheezing or crackles, NT,no use of accessory muscle Cardiovascular system: S1 & S2 +, No JVD, regular RR. Gastrointestinal system: Abdomen soft, NT,ND, BS+ Nervous System:Alert, awake, moving extremities and grossly  nonfocal Extremities: No edema, distal peripheral pulses palpable.  Skin: No rashes,no icterus. MSK: Normal muscle bulk,tone, power Dressing present in his right leg distally and in mid leg pic from 11/08/19         Medications:  Scheduled Meds:  apixaban  5 mg Oral BID   [START ON 11/10/2019] bumetanide  1 mg Oral BID   carvedilol  6.25 mg Oral BID WC   docusate sodium  100 mg Oral BID   linezolid  600 mg Oral Q12H   senna  1 tablet Oral BID   Continuous Infusions:  sodium chloride 250 mL (10/26/19 1204)   sodium chloride     sodium chloride     sodium chloride      Data Reviewed: I have personally reviewed following labs and imaging studies  CBC: Recent Labs  Lab 11/05/19 0530 11/06/19 0417 11/07/19 0748 11/08/19 0830 11/09/19 0425  WBC 8.6 9.3 11.0* 11.7* 9.8  NEUTROABS  --   --  8.5*  --   --   HGB 8.1*  8.5* 8.5* 8.4* 8.4*  HCT 25.3* 26.9* 26.7* 26.8* 26.7*  MCV 91.7 92.8 93.0 94.0 94.0  PLT 483* 535* 549* 564* 123456*   Basic Metabolic Panel: Recent Labs  Lab 11/05/19 0530 11/06/19 0417 11/07/19 0748 11/08/19 0830 11/09/19 0425  NA 136 135 136 136 135  K 4.0 3.8 3.9 3.9 3.9  CL 96* 95* 97* 95* 95*  CO2 31 29 29 31 30   GLUCOSE 118* 115* 119* 123* 128*  BUN 19 19 17 21 23   CREATININE 1.30* 1.28* 1.18 1.40* 1.40*  CALCIUM 8.7* 8.8* 8.6* 8.9 8.8*   GFR: Estimated Creatinine Clearance: 75.9 mL/min (A) (by C-G formula based on SCr of 1.4 mg/dL (H)). Liver Function Tests: Recent Labs  Lab 11/09/19 0425  AST 16  ALT 20  ALKPHOS 65  BILITOT 0.7  PROT 8.1  ALBUMIN 2.7*   No results for input(s): LIPASE, AMYLASE in the last 168 hours. No results for input(s): AMMONIA in the last 168 hours. Coagulation Profile: No results for input(s): INR, PROTIME in the last 168 hours. Cardiac Enzymes: Recent Labs  Lab 11/04/19 0451  CKTOTAL 42*   BNP (last 3 results) No results for input(s): PROBNP in the last 8760 hours. HbA1C: No results for input(s): HGBA1C in the last 72 hours. CBG: No results for input(s): GLUCAP in the last 168 hours. Lipid Profile: No results for input(s): CHOL, HDL, LDLCALC, TRIG, CHOLHDL, LDLDIRECT in the last 72 hours. Thyroid Function Tests: No results for input(s): TSH, T4TOTAL, FREET4, T3FREE, THYROIDAB in the last 72 hours. Anemia Panel: No results for input(s): VITAMINB12, FOLATE, FERRITIN, TIBC, IRON, RETICCTPCT in the last 72 hours. Sepsis Labs: No results for input(s): PROCALCITON, LATICACIDVEN in the last 168 hours.  No results found for this or any previous visit (from the past 240 hour(s)).    Radiology Studies: No results found.    LOS: 15 days   Time spent: More than 50% of that time was spent in counseling and/or coordination of care.  Antonieta Pert, MD [facility]  11/09/2019, 2:02 PM

## 2019-11-09 NOTE — Telephone Encounter (Signed)
Tell her I absolutely will look at his chart and get back to her with any additional comments or recommendations regarding the latest developments and things that have been happening with him.  However, please tell patient that I cannot see all the inpatient physician notes and everything that is exactly going on with him.  Let her know that hospital notes are different than office visit notes and you cannot read them in epic.   Thus, let her know that I will be added disadvantage because of this limitation.    Also, with me not working on inpatient medical care for several years, I am nowhere nears as adept at dealing with inpatient patients as the inpatient physicians.   Thus, I would highly recommend that she make a request to the RN to make sure she is there to speak with the doctor who is taking care of him in the hospital about his exact condition.  She can request a family meeting with that Dr. And have all of her concerns addressed.  I recommend she bring a notebook so she can write things down to make sure she gets facts correct.

## 2019-11-09 NOTE — Progress Notes (Signed)
Inpatient Rehabilitation Admissions Coordinator  I met with patient's wife at bedside. Patient sleeping in recliner. Wife has multiple concerns about pt's BP when up and would like to discuss with MD. I have notified Dr. Maren Beach of her request by secure chat. I continue to follow to await medical readiness to possible admit to CIR pending medical work up completion.   Danne Baxter, RN, MSN Rehab Admissions Coordinator [phone] 11/09/2019 1:38 PM

## 2019-11-09 NOTE — Discharge Instructions (Addendum)
Information on my medicine - ELIQUIS (apixaban)  Why was Eliquis prescribed for you? Eliquis was prescribed for you to reduce the risk of a blood clot forming that can cause a stroke if you have a medical condition called atrial fibrillation (a type of irregular heartbeat).  What do You need to know about Eliquis ? Take your Eliquis TWICE DAILY - one tablet in the morning and one tablet in the evening with or without food. If you have difficulty swallowing the tablet whole please discuss with your pharmacist how to take the medication safely.  Take Eliquis exactly as prescribed by your doctor and DO NOT stop taking Eliquis without talking to the doctor who prescribed the medication.  Stopping may increase your risk of developing a stroke.  Refill your prescription before you run out.  After discharge, you should have regular check-up appointments with your healthcare provider that is prescribing your Eliquis.  In the future your dose may need to be changed if your kidney function or weight changes by a significant amount or as you get older.  What do you do if you miss a dose? If you miss a dose, take it as soon as you remember on the same day and resume taking twice daily.  Do not take more than one dose of ELIQUIS at the same time to make up a missed dose.  Important Safety Information A possible side effect of Eliquis is bleeding. You should call your healthcare provider right away if you experience any of the following: ? Bleeding from an injury or your nose that does not stop. ? Unusual colored urine (red or dark [place]) or unusual colored stools (red or black). ? Unusual bruising for unknown reasons. ? A serious fall or if you hit your head (even if there is no bleeding).  Some medicines may interact with Eliquis and might increase your risk of bleeding or clotting while on Eliquis. To help avoid this, consult your healthcare provider or pharmacist prior to using any new  prescription or non-prescription medications, including herbals, vitamins, non-steroidal anti-inflammatory drugs (NSAIDs) and supplements.  This website has more information on Eliquis (apixaban): http://www.eliquis.com/eliquis/home    Wound Care, Adult  Change Dressing once a day. Use xeroform gauze dressing as first layer, then gauze or abdominal pad, then wrap area with gauze wrap. Over Gauze wrap loosely wrap ace bandage and secure. Do not put a lot of tension of the gauze wrap or ace bandage, should not be a compressive wrap.   Taking care of your wound properly can help to prevent pain, infection, and scarring. It can also help your wound to heal more quickly. How to care for your wound Wound care      Follow instructions from your health care provider about how to take care of your wound. Make sure you: ? Wash your hands with soap and water before you change the bandage (dressing). If soap and water are not available, use hand sanitizer. ? Change your dressing as told by your health care provider. ? Leave stitches (sutures), skin glue, or adhesive strips in place. These skin closures may need to stay in place for 2 weeks or longer. If adhesive strip edges start to loosen and curl up, you may trim the loose edges. Do not remove adhesive strips completely unless your health care provider tells you to do that.  Check your wound area every day for signs of infection. Check for: ? Redness, swelling, or pain. ? Fluid or blood. ?  Warmth. ? Pus or a bad smell.  Ask your health care provider if you should clean the wound with mild soap and water. Doing this may include: ? Using a clean towel to pat the wound dry after cleaning it. Do not rub or scrub the wound. ? Applying a cream or ointment. Do this only as told by your health care provider. ? Covering the incision with a clean dressing.  Ask your health care provider when you can leave the wound uncovered.  Keep the dressing dry  until your health care provider says it can be removed. Do not take baths, swim, use a hot tub, or do anything that would put the wound underwater until your health care provider approves. Ask your health care provider if you can take showers. You may only be allowed to take sponge baths. Medicines   If you were prescribed an antibiotic medicine, cream, or ointment, take or use the antibiotic as told by your health care provider. Do not stop taking or using the antibiotic even if your condition improves.  Take over-the-counter and prescription medicines only as told by your health care provider. If you were prescribed pain medicine, take it 30 or more minutes before you do any wound care or as told by your health care provider. General instructions  Return to your normal activities as told by your health care provider. Ask your health care provider what activities are safe.  Do not scratch or pick at the wound.  Do not use any products that contain nicotine or tobacco, such as cigarettes and e-cigarettes. These may delay wound healing. If you need help quitting, ask your health care provider.  Keep all follow-up visits as told by your health care provider. This is important.  Eat a diet that includes protein, vitamin A, vitamin C, and other nutrient-rich foods to help the wound heal. ? Foods rich in protein include meat, dairy, beans, nuts, and other sources. ? Foods rich in vitamin A include carrots and dark green, leafy vegetables. ? Foods rich in vitamin C include citrus, tomatoes, and other fruits and vegetables. ? Nutrient-rich foods have protein, carbohydrates, fat, vitamins, or minerals. Eat a variety of healthy foods including vegetables, fruits, and whole grains. Contact a health care provider if:  You received a tetanus shot and you have swelling, severe pain, redness, or bleeding at the injection site.  Your pain is not controlled with medicine.  You have redness, swelling, or  pain around the wound.  You have fluid or blood coming from the wound.  Your wound feels warm to the touch.  You have pus or a bad smell coming from the wound.  You have a fever or chills.  You are nauseous or you vomit.  You are dizzy. Get help right away if:  You have a red streak going away from your wound.  The edges of the wound open up and separate.  Your wound is bleeding, and the bleeding does not stop with gentle pressure.  You have a rash.  You faint.  You have trouble breathing. Summary  Always wash your hands with soap and water before changing your bandage (dressing).  To help with healing, eat foods that are rich in protein, vitamin A, vitamin C, and other nutrients.  Check your wound every day for signs of infection. Contact your health care provider if you suspect that your wound is infected. This information is not intended to replace advice given to you by your health care  provider. Make sure you discuss any questions you have with your health care provider. Document Revised: 02/08/2019 Document Reviewed: 05/07/2016 Elsevier Patient Education  [place].

## 2019-11-09 NOTE — Telephone Encounter (Signed)
Altha Harm is aware of this and verbalized understanding. AS, CMA

## 2019-11-09 NOTE — Progress Notes (Signed)
     Subjective:  69 year old male with history of A. Fib on Eliquis, AAS, diastolic CHF, morbid obesity, spinal stenosis, right knee replacement, and OSA presented to the ED on 12/20 with complaint of right leg pain with rash. He was taken to the OR on 12/26 for an irrigation and debridement of his right leg. There was concern for abscess on his right medial thigh, however needle aspiration was performed on this area and no pus or fluid was pulled from the area.  Today patient states pain is mild and has significantly improved over his stay. He notes no new drainage or bleeding from wound. No changes since restarting Eliquis. He has had dizziness upon standing and with movement from bed to door that has been ongoing for the last few days.   Objective:   VITALS:   Vitals:   11/08/19 2019 11/09/19 0421 11/09/19 0957 11/09/19 1216  BP: (!) 141/64 (!) 165/83 120/76 120/66  Pulse: 84 89 94 81  Resp: 18 18 18 18   Temp: 98.8 F (37.1 C) 97.9 F (36.6 C) 98.1 F (36.7 C) 97.7 F (36.5 C)  TempSrc: Oral Oral Oral Oral  SpO2: 95% 96% 95% 98%  Weight:      Height:       General:Laying comfortably inbed, in no acute distress. Resp: no use of accessory musculature. GI: abdomen is soft and non-tender Skin:No new changes from pictures in plastics note yesterday.  Musculoskeletal:EHL and FHL of right lower extremity intact.Able to lift his right leg 5-8 inches off of bed.Sensation and capillary refill intact distally.  Lab Results  Component Value Date   WBC 9.8 11/09/2019   HGB 8.4 (L) 11/09/2019   HCT 26.7 (L) 11/09/2019   MCV 94.0 11/09/2019   PLT 574 (H) 11/09/2019   BMET    Component Value Date/Time   NA 135 11/09/2019 0425   NA 138 08/05/2019 1034   K 3.9 11/09/2019 0425   CL 95 (L) 11/09/2019 0425   CL 101 08/08/2015 0000   CO2 30 11/09/2019 0425   CO2 32 08/08/2015 0000   GLUCOSE 128 (H) 11/09/2019 0425   BUN 23 11/09/2019 0425   BUN 18 08/05/2019 1034   CREATININE 1.40 (H) 11/09/2019 0425   CREATININE 1.02 10/08/2016 1144   CALCIUM 8.8 (L) 11/09/2019 0425   CALCIUM 9.3 08/08/2015 0000   GFRNONAA 51 (L) 11/09/2019 0425   GFRNONAA 77 10/08/2016 1144   GFRAA 59 (L) 11/09/2019 0425   GFRAA 89 10/08/2016 1144     Assessment/Plan: 10 Days Post-Op   Principal Problem:   Necrotizing celluliitis of lower leg ([place]), right Active Problems:   Chronic congestive heart failure (HCC)   Longstanding persistent atrial fibrillation (HCC)   Acute blood loss anemia  Necrotizing celluliitis of right lower extremity: - Dressings changed this evening as patient states his dressing had become uncomfortable. No new changes in skin from pictures in plastics note yesterday.  - Patient has resumed Eliquis, no new bleeding noted -  Patient's wife will be returning with his leg brace (due to history of foot drop) to help with PT in the coming days - pain control significantly improved, continue PRN pain control    Joel Dominguez 11/09/2019, 6:36 PM

## 2019-11-09 NOTE — Telephone Encounter (Signed)
Patient wife states patient has been in hospital for 16 days. Had debridement on 10/30/19 on right leg.Patient wife said he bled for 3 days until they finally stopped Eliquis. Since surgery patient BP has been low(84/46). Per wife is WBC has been low and his HGB has dropped (see labs).  She would like to discuss what is going on with the patient and see if you have any advise or recommendations for her on patient care.

## 2019-11-09 NOTE — Telephone Encounter (Signed)
Patient's wife called requesting to speak with PCP and clinic staff about some medical questions/concerns. He was recently admitted into the hospital and she is really in the dark with what is going on. She says his blood work is not improving, his BP is staying low and that they get a new doctor everyday. She wants some advice about any questions/direction she should be taking. Please call the wife, Gerald Stabs (DPR on file)

## 2019-11-10 ENCOUNTER — Ambulatory Visit: Payer: Medicare Other | Admitting: Podiatry

## 2019-11-10 DIAGNOSIS — D62 Acute posthemorrhagic anemia: Secondary | ICD-10-CM

## 2019-11-10 DIAGNOSIS — I5032 Chronic diastolic (congestive) heart failure: Secondary | ICD-10-CM

## 2019-11-10 DIAGNOSIS — Z7901 Long term (current) use of anticoagulants: Secondary | ICD-10-CM

## 2019-11-10 LAB — COMPREHENSIVE METABOLIC PANEL
ALT: 20 U/L (ref 0–44)
AST: 16 U/L (ref 15–41)
Albumin: 2.5 g/dL — ABNORMAL LOW (ref 3.5–5.0)
Alkaline Phosphatase: 63 U/L (ref 38–126)
Anion gap: 10 (ref 5–15)
BUN: 20 mg/dL (ref 8–23)
CO2: 26 mmol/L (ref 22–32)
Calcium: 8.5 mg/dL — ABNORMAL LOW (ref 8.9–10.3)
Chloride: 101 mmol/L (ref 98–111)
Creatinine, Ser: 1.15 mg/dL (ref 0.61–1.24)
GFR calc Af Amer: >60 mL/min (ref >60)
GFR calc non Af Amer: >60 mL/min (ref >60)
Glucose, Bld: 116 mg/dL — ABNORMAL HIGH (ref 70–99)
Potassium: 3.8 mmol/L (ref 3.5–5.1)
Sodium: 137 mmol/L (ref 135–145)
Total Bilirubin: 0.6 mg/dL (ref 0.3–1.2)
Total Protein: 8 g/dL (ref 6.5–8.1)

## 2019-11-10 LAB — CBC
HCT: 24.1 % — ABNORMAL LOW (ref 39.0–52.0)
Hemoglobin: 7.7 g/dL — ABNORMAL LOW (ref 13.0–17.0)
MCH: 29.4 pg (ref 26.0–34.0)
MCHC: 32 g/dL (ref 30.0–36.0)
MCV: 92 fL (ref 80.0–100.0)
Platelets: 497 10*3/uL — ABNORMAL HIGH (ref 150–400)
RBC: 2.62 MIL/uL — ABNORMAL LOW (ref 4.22–5.81)
RDW: 16 % — ABNORMAL HIGH (ref 11.5–15.5)
WBC: 8.9 10*3/uL (ref 4.0–10.5)
nRBC: 0 % (ref 0.0–0.2)

## 2019-11-10 MED ORDER — BUMETANIDE 1 MG PO TABS: 1.0000 mg | ORAL_TABLET | Freq: Two times a day (BID) | ORAL | Status: DC

## 2019-11-10 MED ORDER — BUMETANIDE 1 MG PO TABS
1.0000 mg | ORAL_TABLET | Freq: Every day | ORAL | Status: DC
  Administered 2019-11-11: 09:00:00 1 mg via ORAL
  Filled 2019-11-10: qty 1

## 2019-11-10 MED ORDER — SENNA 8.6 MG PO TABS: 1.0000 | ORAL_TABLET | Freq: Two times a day (BID) | ORAL | 0 refills | Status: DC

## 2019-11-10 MED ORDER — CARVEDILOL 6.25 MG PO TABS: 6.2500 mg | ORAL_TABLET | Freq: Two times a day (BID) | ORAL | Status: DC

## 2019-11-10 MED ORDER — BUMETANIDE 1 MG PO TABS: 1.0000 mg | ORAL_TABLET | Freq: Every day | ORAL | 3 refills | Status: DC

## 2019-11-10 MED ORDER — DOCUSATE SODIUM 100 MG PO CAPS: 100.0000 mg | ORAL_CAPSULE | Freq: Two times a day (BID) | ORAL | 0 refills | Status: DC

## 2019-11-10 NOTE — Care Management Important Message (Signed)
Important Message  Patient Details  Name: Neuman Jian MRN: [id number] Date of Birth: 10-Jan-1951   Medicare Important Message Given:  Yes     Shelda Altes 11/10/2019, 12:30 PM

## 2019-11-10 NOTE — Discharge Summary (Addendum)
Physician Discharge Summary  Joel Dominguez [id number] DOB: 06-06-1951 DOA: 10/24/2019  PCP: Mellody Dance, DO  Admit date: 10/24/2019 Discharge date: 11/11/2019  Admitted From: home Disposition:  CIR  Recommendations for Outpatient Follow-up:  1. Follow up with PCP in 1-2 weeks 2. Please obtain BMP/CBC in one week 3. Please follow up on the following pending results:  Home Health:no  Equipment/Devices: none  Discharge Condition: Stable Code Status: full Diet recommendation: Heart Healthy  Brief/Interim Summary:  69 year old male with history of A. fib on Eliquis, AAS, diastolic CHF, moderate MR, morbid obesity with BMI 47, obese,OSA on CPAP presents with right leg leg necrotizing cellulitis. On presentation he was febrile, leukocytosis 20K underwent excisional debridement of the skin and subcutaneous tissue of the right lower extremity by Ortho 12/26.Patient has been on antibiotics, wound culture results no growth so far. Nasal swab with MRSA. He underwent excisional debridement 12/26.Had to stop anti-coag tx due to significant bleeding and anemia.  Dressing was applied tightly by orthopedics, patient was continued on linezolid On dressing removal subsequently wound seems to have improved with no bleeding and further surgery was canceled, was also seen by plastic surgery.  Discussed with orthopedic PA Owens Shark this morning okay to discontinue antibiotics patient received 14 days of Zyvox.  At this time no signs of infection.  Okay to discharge to inpatient rehab per orthopedic and this instruction has been written. Patient had episode of orthostatic hypotension and dizziness 1/5 given 1 L fluid bolus and infusion overnight and subsequently orthostatic vitals were -1/6. As per request cardiology was consulted and cardiac will see the patient if he remains inpatient already will see him in the inpatient rehab.  I have cut down his Coreg and Bumex and held this myocardial for now.   Follow-up with daily monitoring by physician at CIR to adjust patient's diuretics antihypertensives.  Monitor hemoglobin.  11/10/10- updated d/c- Bumex decreased to 1 mg daily per cardio, HB up at 7.9. remains stable for CIR. Keep inpatient until CIR available. Coreg increased due to NSVT.  Discharge Diagnoses:  Necrotizing celluliitis of lower leg, right:S/p excisional debridement of the skin and subcutaneous tissue of the right lower extremity by Ortho 12/26.  Ortho and plastics following, reviewed the wound imaging this morning and discussed with plastic PA and they are not going to have additional surgery at this time since wound is healing nicely and okay to resume Eliquis.  Continue on current dressing as per ortho. Aerobic/anaerobic culture from the wound: Gram stain with rare WBC, wound culture no growth so far. MRSA nasal negative. Blood cultures negative. Stop linezolid today Chronic congestive heart failure continue Bumex at reduced dose due to orthostatic hypotension.  Reassess for need to go for further cut down the dose.  Cardiology consulted as per patient's wife's request Longstanding persistent atrial fibrillation-decreased Coreg and also on Eliquis.  Acute blood loss anemia: Monitor hemoglobin and Eliquis. Recent Labs  Lab 11/07/19 0748 11/08/19 0830 11/09/19 0425 11/10/19 0524 11/11/19 0510  HGB 8.5* 8.4* 8.4* 7.7* 7.9*  HCT 26.7* 26.8* 26.7* 24.1* 25.7*   orthostatic hypotension/dizziness: Resolved with IV fluids and cutting down on his diuretics and Coreg now increased to 12.5 mg bid. Essential hypertension- cont  meds as above. OSA on CPAP-cont bed time Morbid Obesity with BMI 44-we will need to advise weight loss and healthy lifestyle follow-up with PCP  Pressure Ulcer: Pressure Injury 10/28/19 Buttocks Right;Left;Mid Deep Tissue Pressure Injury - Purple or maroon localized area of discolored intact skin or  blood-filled blister due to damage of underlying soft tissue  from pressure and/or shear.  (Active)  10/28/19 0800  Location: Buttocks  Location Orientation: Right;Left;Mid  Staging: Deep Tissue Pressure Injury - Purple or maroon localized area of discolored intact skin or blood-filled blister due to damage of underlying soft tissue from pressure and/or shear.  Wound Description (Comments): -- (red and purple R&L buttocks, crack in the middle)  Present on Admission: -- (unsure)   Disposition:discharge to inpatient rehab once bed available.  Discussed w/ orthopedic and okay for discharge  Consults:  Ortho,plastic, cardiology  Subjective: Feels well this morning did orthostatic vitals and was negative was slightly dizzy. No new complaints. No nausea vomiting fever chills.  Discharge Exam: Vitals:   11/10/19 2100 11/11/19 0522  BP: 131/66 (!) 148/63  Pulse: 92 84  Resp: 20 20  Temp: 98.7 F (37.1 C) 97.9 F (36.6 C)  SpO2: 93% 94%   General: Pt is alert, awake, not in acute distress Cardiovascular: RRR, S1/S2 +, no rubs, no gallops Respiratory: CTA bilaterally, no wheezing, no rhonchi Abdominal: Soft, NT, ND, bowel sounds + Extremities: no edema, no cyanosis. Rt  Lower extremity in dressing  Discharge Instructions  Discharge Instructions    Change dressing   Complete by: As directed    Change Dressing once a day. Use xeroform gauze dressing as first layer, then gauze or abdominal pad, then wrap area with gauze wrap. Over Gauze wrap loosely wrap ace bandage and secure. Do not put a lot of tension of the gauze wrap or ace bandage, should not be a compressive wrap.   Diet - low sodium heart healthy   Complete by: As directed    Discharge instructions   Complete by: As directed    Please call call MD or return to ER for similar or worsening recurring problem that brought you to hospital or if any fever,nausea/vomiting,abdominal pain, uncontrolled pain, chest pain,  shortness of breath or any other alarming symptoms.  Please follow-up  your doctor as instructed in a week time and call the office for appointment.  Please avoid alcohol, smoking, or any other illicit substance and maintain healthy habits including taking your regular medications as prescribed.  You were cared for by a hospitalist during your hospital stay. If you have any questions about your discharge medications or the care you received while you were in the hospital after you are discharged, you can call the unit and ask to speak with the hospitalist on call if the hospitalist that took care of you is not available.  Once you are discharged, your primary care physician will handle any further medical issues. Please note that NO REFILLS for any discharge medications will be authorized once you are discharged, as it is imperative that you return to your primary care physician (or establish a relationship with a primary care physician if you do not have one) for your aftercare needs so that they can reassess your need for medications and monitor your lab values  Your telmisartan has been on hold due to orthostatic hypotension, which has been decreased.  Bumex dose also has been halved.  Please follow-up with your cardiology PCP monitor blood pressure to see if you need to go back on your antihypertensive medication   Increase activity slowly   Complete by: As directed    Weight bearing as tolerated   Complete by: As directed      Allergies as of 11/11/2019      Reactions  Cephalexin Rash      Medication List    STOP taking these medications   telmisartan 80 MG tablet Commonly known as: MICARDIS     TAKE these medications   apixaban 5 MG Tabs tablet Commonly known as: Eliquis Take 1 tablet (5 mg total) by mouth 2 (two) times daily.   bumetanide 1 MG tablet Commonly known as: BUMEX Take 1 tablet (1 mg total) by mouth daily. What changed:   medication strength  how much to take  when to take this   carvedilol 12.5 MG tablet Commonly known as:  COREG Take 1 tablet (12.5 mg total) by mouth 2 (two) times daily with a meal. What changed:   medication strength  See the new instructions.   docusate sodium 100 MG capsule Commonly known as: COLACE Take 1 capsule (100 mg total) by mouth 2 (two) times daily.   HYDROcodone-acetaminophen 10-325 MG tablet Commonly known as: NORCO Take 1 tablet by mouth as needed for moderate pain.   multivitamin tablet Take 1 tablet by mouth daily.   potassium chloride 10 MEQ tablet Commonly known as: KLOR-CON Take 1 tablet (10 mEq total) by mouth 2 (two) times daily.   senna 8.6 MG Tabs tablet Commonly known as: SENOKOT Take 1 tablet (8.6 mg total) by mouth 2 (two) times daily.   spironolactone 25 MG tablet Commonly known as: ALDACTONE Take 1 tablet (25 mg total) by mouth daily.   Vitamin D (Cholecalciferol) 50 MCG (2000 UT) Caps Take 50 mcg by mouth daily.   Vitamin D (Ergocalciferol) 1.25 MG (50000 UT) Caps capsule Commonly known as: DRISDOL TAKE 1 CAPSULE BY MOUTH EVERY 7 DAYS. What changed: See the new instructions.            Discharge Care Instructions  (From admission, onward)         Start     Ordered   11/10/19 0000  Change dressing    Comments: Change Dressing once a day. Use xeroform gauze dressing as first layer, then gauze or abdominal pad, then wrap area with gauze wrap. Over Gauze wrap loosely wrap ace bandage and secure. Do not put a lot of tension of the gauze wrap or ace bandage, should not be a compressive wrap.   11/10/19 1013   11/10/19 0000  Weight bearing as tolerated     11/10/19 1013         Follow-up Information    Opalski, Neoma Laming, DO Follow up in 1 week(s).   Specialty: Family Medicine Contact information: [place] [address] [phone]        Marchia Bond, MD. Schedule an appointment as soon as possible for a visit in 1 week(s).   Specialty: Orthopedic Surgery Why: Call and make an appointment for 1 week after  discharge Contact information: [place] [address] [phone]          Allergies  Allergen Reactions  . Cephalexin Rash    The results of significant diagnostics from this hospitalization (including imaging, microbiology, ancillary and laboratory) are listed below for reference.    Microbiology: No results found for this or any previous visit (from the past 240 hour(s)).  Procedures/Studies: DG Chest 2 View  Result Date: 10/21/2019 CLINICAL DATA:  Shortness of breath and weakness for the past 2 days. EXAM: CHEST - 2 VIEW COMPARISON:  Chest x-ray dated April 15, 2019. FINDINGS: The heart is at the upper limits of normal in size. Atherosclerotic calcification  of the aortic arch. Normal pulmonary vascularity. No focal consolidation, pleural effusion, or pneumothorax. No acute osseous abnormality. IMPRESSION: No active cardiopulmonary disease. Electronically Signed   By: Titus Dubin M.D.   On: 10/21/2019 13:30   DG Chest Port 1 View  Result Date: 10/24/2019 CLINICAL DATA:  Chest pain and shortness of breath EXAM: PORTABLE CHEST 1 VIEW COMPARISON:  October 21, 2019 FINDINGS: Stable cardiomegaly. The hila and mediastinum are normal. No pneumothorax. No nodules or masses. No focal infiltrates. IMPRESSION: No active disease. Electronically Signed   By: Dorise Bullion III M.D   On: 10/24/2019 13:37   CT EXTREMITY LOWER RIGHT WO CONTRAST  Result Date: 10/28/2019 CLINICAL DATA:  Severe right leg cellulitis EXAM: CT OF THE LOWER RIGHT EXTREMITY WITHOUT CONTRAST TECHNIQUE: Multidetector CT imaging of the right lower extremity was performed according to the standard protocol. COMPARISON:  None. FINDINGS: Bones/Joint/Cartilage No fracture or dislocation. There is diffuse osteopenia. Moderate right hip osteoarthritis is seen with superior joint space loss and marginal osteophyte formation. The patient is status post right total knee arthroplasty. No  periprosthetic lucency or fracture is identified. The sacroiliac joints appear to be intact. There is a small fragmented osteophyte seen at the superior right sacroiliac joint. Ligaments Suboptimally assessed by CT. Muscles and Tendons There is focal fatty atrophy seen within the mid rectus muscle with a tiny focus calcification, likely from prior focal tear. There is mild fatty atrophy noted throughout the remainder of the muscles. The visualized portions the tendons appear to be intact, however suboptimally visualized. Soft tissues There is diffuse subcutaneous edema and skin thickening seen surrounding the posterior medial lower leg there is a superficial cystic collection. Best seen on series 204, image 312 measuring 3 cm in length. IMPRESSION: 1. No acute osseous abnormality. No definite evidence of osteomyelitis. 2. Diffuse findings suggestive of cellulitis with superficial loculated collection over the posterior medial lower extremity which could represent phlegmon or early superficial abscess. 3. Focal fatty atrophy of the mid rectus femoris musculature, likely from a prior tear. Electronically Signed   By: Prudencio Pair M.D.   On: 10/28/2019 19:08   DVT  Result Date: 10/27/2019  Lower Venous Study Indications: Edema, and Swelling.  Limitations: Body habitus and poor ultrasound/tissue interface. Comparison Study: no prior Performing Technologist: Abram Sander RVS  Examination Guidelines: A complete evaluation includes B-mode imaging, spectral Doppler, color Doppler, and power Doppler as needed of all accessible portions of each vessel. Bilateral testing is considered an integral part of a complete examination. Limited examinations for reoccurring indications may be performed as noted.  +---------+---------------+---------+-----------+----------+-------------------+ RIGHT    CompressibilityPhasicitySpontaneityPropertiesThrombus Aging       +---------+---------------+---------+-----------+----------+-------------------+ CFV      Full                                                             +---------+---------------+---------+-----------+----------+-------------------+ SFJ      Full                                                             +---------+---------------+---------+-----------+----------+-------------------+ FV Prox  Full                                                             +---------+---------------+---------+-----------+----------+-------------------+  FV Mid   Full                                                             +---------+---------------+---------+-----------+----------+-------------------+ FV Distal               Yes      Yes                  unable to visualize                                                       well for                                                                  compression         +---------+---------------+---------+-----------+----------+-------------------+ PFV      Full                                                             +---------+---------------+---------+-----------+----------+-------------------+ POP      Full                                                             +---------+---------------+---------+-----------+----------+-------------------+ PTV      Full                                                             +---------+---------------+---------+-----------+----------+-------------------+ PERO                                                  Not visualized      +---------+---------------+---------+-----------+----------+-------------------+   +---------+---------------+---------+-----------+----------+--------------+ LEFT     CompressibilityPhasicitySpontaneityPropertiesThrombus Aging +---------+---------------+---------+-----------+----------+--------------+ CFV      Full            Yes      Yes                                 +---------+---------------+---------+-----------+----------+--------------+ SFJ      Full                                                        +---------+---------------+---------+-----------+----------+--------------+  FV Prox  Full                                                        +---------+---------------+---------+-----------+----------+--------------+ FV Mid   Full                                                        +---------+---------------+---------+-----------+----------+--------------+ FV DistalFull                                                        +---------+---------------+---------+-----------+----------+--------------+ PFV      Full                                                        +---------+---------------+---------+-----------+----------+--------------+ POP      Full           Yes      Yes                                 +---------+---------------+---------+-----------+----------+--------------+ PTV      Full                                                        +---------+---------------+---------+-----------+----------+--------------+ PERO                                                  Not visualized +---------+---------------+---------+-----------+----------+--------------+     Summary: Right: There is no evidence of deep vein thrombosis in the lower extremity. However, portions of this examination were limited- see technologist comments above. No cystic structure found in the popliteal fossa. Left: There is no evidence of deep vein thrombosis in the lower extremity. No cystic structure found in the popliteal fossa.  *See table(s) above for measurements and observations. Electronically signed by Curt Jews MD on 10/27/2019 at 4:02:21 PM.    Final     Labs: BNP (last 3 results) No results for input(s): BNP in the last 8760 hours. Basic Metabolic  Panel: Recent Labs  Lab 11/07/19 0748 11/08/19 0830 11/09/19 0425 11/10/19 0524 11/11/19 0510  NA 136 136 135 137 137  K 3.9 3.9 3.9 3.8 4.1  CL 97* 95* 95* 101 100  CO2 29 31 30 26  32  GLUCOSE 119* 123* 128* 116* 117*  BUN 17 21 23 20 15   CREATININE 1.18 1.40* 1.40* 1.15 1.15  CALCIUM 8.6* 8.9 8.8* 8.5* 8.7*   Liver Function Tests: Recent Labs  Lab 11/09/19 0425  11/10/19 0524 11/11/19 0510  AST 16 16 16   ALT 20 20 18   ALKPHOS 65 63 64  BILITOT 0.7 0.6 0.6  PROT 8.1 8.0 7.8  ALBUMIN 2.7* 2.5* 2.6*   No results for input(s): LIPASE, AMYLASE in the last 168 hours. No results for input(s): AMMONIA in the last 168 hours. CBC: Recent Labs  Lab 11/07/19 0748 11/08/19 0830 11/09/19 0425 11/10/19 0524 11/11/19 0510  WBC 11.0* 11.7* 9.8 8.9 8.7  NEUTROABS 8.5*  --   --   --   --   HGB 8.5* 8.4* 8.4* 7.7* 7.9*  HCT 26.7* 26.8* 26.7* 24.1* 25.7*  MCV 93.0 94.0 94.0 92.0 94.1  PLT 549* 564* 574* 497* 513*   Cardiac Enzymes: Recent Labs  Lab 11/11/19 0510  CKTOTAL 26*   BNP: Invalid input(s): POCBNP CBG: No results for input(s): GLUCAP in the last 168 hours. D-Dimer No results for input(s): DDIMER in the last 72 hours. Hgb A1c No results for input(s): HGBA1C in the last 72 hours. Lipid Profile No results for input(s): CHOL, HDL, LDLCALC, TRIG, CHOLHDL, LDLDIRECT in the last 72 hours. Thyroid function studies No results for input(s): TSH, T4TOTAL, T3FREE, THYROIDAB in the last 72 hours.  Invalid input(s): FREET3 Anemia work up No results for input(s): VITAMINB12, FOLATE, FERRITIN, TIBC, IRON, RETICCTPCT in the last 72 hours. Urinalysis    Component Value Date/Time   COLORURINE AMBER (A) 10/21/2019 1311   APPEARANCEUR HAZY (A) 10/21/2019 1311   APPEARANCEUR Clear 08/28/2018 1150   LABSPEC 1.017 10/21/2019 1311   PHURINE 5.0 10/21/2019 1311   GLUCOSEU NEGATIVE 10/21/2019 1311   HGBUR NEGATIVE 10/21/2019 1311   BILIRUBINUR NEGATIVE 10/21/2019 1311    BILIRUBINUR Negative 08/28/2018 1150   KETONESUR NEGATIVE 10/21/2019 1311   PROTEINUR NEGATIVE 10/21/2019 1311   NITRITE NEGATIVE 10/21/2019 1311   LEUKOCYTESUR NEGATIVE 10/21/2019 1311   Sepsis Labs Invalid input(s): PROCALCITONIN,  WBC,  LACTICIDVEN Microbiology No results found for this or any previous visit (from the past 240 hour(s)).   Time coordinating discharge: 35  minutes  SIGNED: Antonieta Pert, MD  [facility] 11/11/2019, 4:09 PM  If 7PM-7AM, please contact night-coverage [email or web address]

## 2019-11-10 NOTE — Progress Notes (Signed)
Inpatient Rehabilitation Admissions Coordinator  I await bed availability on CIR and updated treatment note from therapy today for possible CIR vs Home with [facility]. I met with pt at bedside with this wife, updated RN, RN CM, Neoma Laming and SW, Caryl Pina and Dr. Maren Beach.  Danne Baxter, RN, MSN Rehab Admissions Coordinator [phone] 11/10/2019 12:37 PM

## 2019-11-10 NOTE — Progress Notes (Signed)
Pt states he does not want to do orthostatic vitals at this time.

## 2019-11-10 NOTE — Progress Notes (Signed)
Physical Therapy Treatment Patient Details Name: Joel Dominguez MRN: [id number] DOB: 1951/07/07 Today's Date: 11/10/2019    History of Present Illness 69 year old male with history of A. fib on Eliquis, AAS, diastolic CHF, moderate MR, morbid obesity and OSA presented with right leg pain and swelling with blisters, admitted for RLE cellulitis. s/p excisional debridement 12/26.    PT Comments    Patient is making progress toward PT goals and tolerated increased activity this session. Pt requires min guard/min A for mobility this session and able to ambulate 150 ft with RW with 2 seated rest breaks. Pt SOB with mobility and reports mild lightheadedness with mobility. Continue to progress as tolerated.     Follow Up Recommendations  CIR;Supervision/Assistance - 24 hour     Equipment Recommendations  Rolling walker with 5" wheels    Recommendations for Other Services Rehab consult     Precautions / Restrictions Precautions Precautions: Fall Precaution Comments: drop in BP with activity Required Braces or Orthoses: Other Brace Other Brace: pt has orthotic for R drop foot however too swollen/painful to don this session Restrictions Weight Bearing Restrictions: No    Mobility  Bed Mobility Overal bed mobility: Needs Assistance Bed Mobility: Supine to Sit     Supine to sit: Min assist;HOB elevated     General bed mobility comments: assist to elevate trunk into sitting; use of rail  Transfers Overall transfer level: Needs assistance Equipment used: Rolling walker (2 wheeled) Transfers: Sit to/from Stand Sit to Stand: Min assist;Min guard         General transfer comment: cues for safe hand placement; assist to power up initial stand and min guard after X 2 trials from recliner  Ambulation/Gait Ambulation/Gait assistance: Min guard(+2 for chair follow) Gait Distance (Feet): (150 ft with 2 seated rest breaks) Assistive device: Rolling walker (2 wheeled) Gait  Pattern/deviations: Step-through pattern;Trunk flexed;Wide base of support;Decreased step length - right;Decreased step length - left;Decreased weight shift to right;Decreased dorsiflexion - right Gait velocity: reduced   General Gait Details: assist to steady; chair follow due to decreased activity tolerance; pt SOB with mobility and required seated rest breaks    Stairs             Wheelchair Mobility    Modified Rankin (Stroke Patients Only)       Balance Overall balance assessment: Needs assistance Sitting-balance support: No upper extremity supported;Feet supported Sitting balance-Leahy Scale: Good     Standing balance support: Bilateral upper extremity supported Standing balance-Leahy Scale: Poor Standing balance comment: reliant on RW                            Cognition Arousal/Alertness: Awake/alert Behavior During Therapy: WFL for tasks assessed/performed Overall Cognitive Status: Within Functional Limits for tasks assessed                                        Exercises      General Comments        Pertinent Vitals/Pain Pain Assessment: Faces Faces Pain Scale: Hurts a little bit Pain Location: RLE Pain Descriptors / Indicators: Sore Pain Intervention(s): Limited activity within patient's tolerance;Monitored during session;Repositioned    Home Living                      Prior Function Level of Independence: Independent with assistive device(s)  PT Goals (current goals can now be found in the care plan section) Acute Rehab PT Goals Patient Stated Goal: To return to prior level of function Progress towards PT goals: Progressing toward goals    Frequency    Min 3X/week      PT Plan Current plan remains appropriate    Co-evaluation              AM-PAC PT "6 Clicks" Mobility   Outcome Measure  Help needed turning from your back to your side while in a flat bed without using  bedrails?: A Little Help needed moving from lying on your back to sitting on the side of a flat bed without using bedrails?: A Little Help needed moving to and from a bed to a chair (including a wheelchair)?: A Little Help needed standing up from a chair using your arms (e.g., wheelchair or bedside chair)?: A Little Help needed to walk in hospital room?: A Little Help needed climbing 3-5 steps with a railing? : Total 6 Click Score: 16    End of Session   Activity Tolerance: Patient tolerated treatment well Patient left: in chair;with call bell/phone within reach;with family/visitor present Nurse Communication: Mobility status PT Visit Diagnosis: Muscle weakness (generalized) (M62.81)     Time: [id number] PT Time Calculation (min) (ACUTE ONLY): 28 min  Charges:  $Gait Training: 23-37 mins                     Earney Navy, PTA Acute Rehabilitation Services Pager: [phone] Office: [phone]     Darliss Cheney 11/10/2019, 1:07 PM

## 2019-11-10 NOTE — Progress Notes (Signed)
Pt is able to place himself on his home CPAP machine without assistance. RT will continue to monitor.

## 2019-11-10 NOTE — PMR Pre-admission (Signed)
PMR Admission Coordinator Pre-Admission Assessment  Patient: Joel Dominguez is an 69 y.o., male MRN: [id number] DOB: [birth year]Height: 6' (182.9 cm) Weight: (!) 146.6 kg  Insurance Information HMO:     PPO:      PCP:      IPA:     80/20:      OTHER:  PRIMARY: Medicare a and b      Policy#: [id number]      Subscriber: pt Benefits:  Phone #: passport one online     Name: 11/10/2019 Eff. Date: 06/04/2009     Deduct: $1484      Out of Pocket Max: none      Life Max: none CIR: 100%      SNF: 20 full days Outpatient: 80%     Co-Pay: 20% Home Health: 100%      Co-Pay: none DME: 80%     Co-Pay: 20% Providers: pt choice  SECONDARY: [employer]      [phone]      Subscriber: pt  Medicaid Application Date:       Case Manager:  Disability Application Date:       Case Worker:   The "Data Collection Information Summary" for patients in Inpatient Rehabilitation Facilities with attached "Privacy Act [place] Records" was provided and verbally reviewed with: Patient and Family  Emergency Contact Information Contact Information    Name Relation Home Work Mobile   Dominguez,Joel Spouse [phone]        Current Medical History  Patient Admitting Diagnosis: Debility  History of Present Illness: 69 year old male with medical history of A. Fib, aortic stenosis, atherosclerotic heart disease, diastolic CHF, moderate mitral regurgitation, chronic anticoagulation and morbid obesity. Presented 10/24/2019 with complaints of right leg pain and rash. Had previously presented to ED and was diagnosed with dehydration, diuretics held and told to continue compression socks for chronic bilateral leg swelling. Patient febrile with leukocytosis 20 K underwent excisional debridement of the skin and subcutaneous tissue of the right lower extremity by Ortho, Dr. Mardelle Matte, on 12/26.  Patient on antibiotics, wound cultures no growth so far. Had to discontinue anticoagulation due to significant  bleeding and anemia. Dressing applied by orthopedics, and patient continued on linezolid. Subsequently dressings removed with no bleeding and improvement of wounds. Further surgery including plastics consulted, Dr. Marla Roe canceled due to improvement of wounds. Eliquis to be resumed.To discontinue antibiotics today for patient received 14 days of Zyvox. Patient with episode of ortho stasis and dizziness. On 1/5 patient given 1 liter fluid bolus and infusion overnight. Per family request, cardiology was consulted. Coreg decreased and Bumex held for now with cardiology consult on 1/6 and medication adjustments made. To monitor hemoglobin now that Eliquis resumed.  Patient's medical record from [facility] has been reviewed by the rehabilitation admission coordinator and physician.  Past Medical History  Past Medical History:  Diagnosis Date  . A-fib 10/08/2016  . Aortic stenosis 11/06/2017  . Atherosclerotic heart disease of native coronary artery with unspecified angina pectoris ([place]) 10/08/2016  . Cellulitis 11/2019   right lower extremity  . CHF (congestive heart failure) ([place])   . Chronic anticoagulation 10/08/2016  . Foot drop- R 10/08/2016   Patient known foot drop for many years due to lumbar spinal stenosis.-Over the past couple of months symptoms have gotten worse.  He feels he is tripping more and cannot lift his foot up as much. Toes are curling and calluses on end of toes.    - Did see  spinal surgeon many yrs ago.  Declined sx at that time.     . Generalized OA 10/08/2016   Ortho doc-  Dr. [facility] in [place]- both knees replaced 08, 09.     . H/O noncompliance with medical treatment, presenting hazards to health 10/22/2017  . Heart disease   . Hypertension   . Obesity, Class III, BMI 40-49.9 (morbid obesity) ([place]) 10/08/2016  . OSA (obstructive sleep apnea) 10/08/2016   Bi-PAP nitely- been 20+ yrs now  . Pulmonary hypertension ([place]) 10/08/2016  . Spinal stenosis, lumbar region,  with neurogenic claudication 10/08/2016   Back specialist in [place]-    only surgeon he would use- pt declined referral here for back pain mgt    . Stroke [facility])     Family History   family history includes Cancer in his father; Congestive Heart Failure in his mother and sister; Diabetes in his brother and mother; Hypertension in his brother, mother, sister, and sister.  Prior Rehab/Hospitalizations Has the patient had prior rehab or hospitalizations prior to admission? Yes  Has the patient had major surgery during 100 days prior to admission? Yes   Current Medications  Current Facility-Administered Medications:  .  0.9 %  sodium chloride infusion, , Intravenous, PRN, Ventura Bruns, PA-C, Last Rate: 10 mL/hr at 10/26/19 1204, 250 mL at 10/26/19 1204 .  acetaminophen (TYLENOL) tablet 325-650 mg, 325-650 mg, Oral, Q6H PRN, Merlene Pulling K, PA-C .  apixaban (ELIQUIS) tablet 5 mg, 5 mg, Oral, BID, Kc, Ramesh, MD, 5 mg at 11/11/19 0924 .  bisacodyl (DULCOLAX) suppository 10 mg, 10 mg, Rectal, Daily PRN, Owens Shark, Blaine K, PA-C .  bumetanide (BUMEX) tablet 1 mg, 1 mg, Oral, Daily, Skeet Latch, MD, 1 mg at 11/11/19 0924 .  carvedilol (COREG) tablet 12.5 mg, 12.5 mg, Oral, BID WC, Skeet Latch, MD .  carvedilol (COREG) tablet 6.25 mg, 6.25 mg, Oral, Once, Skeet Latch, MD .  cyclobenzaprine (FLEXERIL) tablet 5 mg, 5 mg, Oral, TID PRN, Merlene Pulling K, PA-C, 5 mg at 11/02/19 7062 .  diphenhydrAMINE (BENADRYL) 12.5 MG/5ML elixir 12.5-25 mg, 12.5-25 mg, Oral, Q4H PRN, Owens Shark, Blaine K, PA-C .  docusate sodium (COLACE) capsule 100 mg, 100 mg, Oral, BID, Merlene Pulling K, PA-C, 100 mg at 11/11/19 0924 .  labetalol (NORMODYNE) injection 5 mg, 5 mg, Intravenous, Q4H PRN, Kc, Ramesh, MD .  magnesium citrate solution 1 Bottle, 1 Bottle, Oral, Once PRN, Owens Shark, Blaine K, PA-C .  metoCLOPramide (REGLAN) tablet 5-10 mg, 5-10 mg, Oral, Q8H PRN **OR** metoCLOPramide (REGLAN) injection 5-10 mg, 5-10 mg,  Intravenous, Q8H PRN, Owens Shark, Blaine K, PA-C .  morphine 2 MG/ML injection 1 mg, 1 mg, Intravenous, Q3H PRN, Georgette Shell, MD, 1 mg at 11/06/19 1842 .  ondansetron (ZOFRAN) tablet 4 mg, 4 mg, Oral, Q6H PRN **OR** ondansetron (ZOFRAN) injection 4 mg, 4 mg, Intravenous, Q6H PRN, Owens Shark, Blaine K, PA-C .  oxyCODONE (Oxy IR/ROXICODONE) immediate release tablet 10-15 mg, 10-15 mg, Oral, Q4H PRN, Merlene Pulling K, PA-C, 10 mg at 11/10/19 1417 .  oxyCODONE (Oxy IR/ROXICODONE) immediate release tablet 5-10 mg, 5-10 mg, Oral, Q4H PRN, Merlene Pulling K, PA-C, 5 mg at 11/09/19 1522 .  polyethylene glycol (MIRALAX / GLYCOLAX) packet 17 g, 17 g, Oral, Daily PRN, Ventura Bruns, PA-C .  senna (SENOKOT) tablet 8.6 mg, 1 tablet, Oral, BID, Merlene Pulling K, PA-C, 8.6 mg at 11/11/19 0924 .  sodium chloride 0.9 % bolus 500 mL, 500 mL, Intravenous, Once, Marchia Bond,  MD .  zolpidem (AMBIEN) tablet 5 mg, 5 mg, Oral, QHS PRN, Merlene Pulling K, PA-C  Patients Current Diet:  Diet Order            Diet - low sodium heart healthy        Diet regular Room service appropriate? Yes; Fluid consistency: Thin  Diet effective now              Precautions / Restrictions Precautions Precautions: Fall Precaution Comments: drop in BP with activity Other Brace: pt has orthotic for R drop foot however too swollen/painful to don this session Restrictions Weight Bearing Restrictions: No   Has the patient had 2 or more falls or a fall with injury in the past year? No  Prior Activity Level Limited Community (1-2x/wk): Mod I; drove; wife assisted with LB bathing and dressign  Prior Functional Level Self Care: Did the patient need help bathing, dressing, using the toilet or eating? Needed some help  Indoor Mobility: Did the patient need assistance with walking from room to room (with or without device)? Independent  Stairs: Did the patient need assistance with internal or external stairs (with or without device)?  Independent  Functional Cognition: Did the patient need help planning regular tasks such as shopping or remembering to take medications? [place] / Equipment Home Assistive Devices/Equipment: Radio producer (specify quad or straight), Eyeglasses Home Equipment: Cane - quad, Shower seat, Grab bars - tub/shower  Prior Device Use: Indicate devices/aids used by the patient prior to current illness, exacerbation or injury? cane  Current Functional Level Cognition  Overall Cognitive Status: Within Functional Limits for tasks assessed Orientation Level: Oriented X4    Extremity Assessment (includes Sensation/Coordination)  Upper Extremity Assessment: Overall WFL for tasks assessed  Lower Extremity Assessment: Defer to PT evaluation    ADLs  Overall ADL's : Needs assistance/impaired Eating/Feeding: Independent Grooming: Oral care, Wash/dry hands, Wash/dry face, Sitting, Supervision/safety, Min guard Grooming Details (indicate cue type and reason): difficulty with prolonged unsupported sitting Upper Body Bathing: Supervision/ safety, Min guard, Sitting Lower Body Bathing: Maximal assistance, Sitting/lateral leans, Sit to/from stand Upper Body Dressing : Supervision/safety, Min guard, Sitting Lower Body Dressing: Maximal assistance, Sitting/lateral leans, Sit to/from stand Toilet Transfer: Minimal assistance, Ambulation, RW Toilet Transfer Details (indicate cue type and reason): recliner>chair 5 feet away (x2) Toileting- Clothing Manipulation and Hygiene: Moderate assistance, Sitting/lateral lean, Sit to/from stand Functional mobility during ADLs: Moderate assistance, Rolling walker, Cueing for safety General ADL Comments: due to increased pain, decreased activity tolerance and strength pt requiring increased assistance for self care than baseline    Mobility  Overal bed mobility: Needs Assistance Bed Mobility: Supine to Sit Rolling: Modified independent (Device/Increase  time) Supine to sit: Min assist, HOB elevated Sit to supine: Min assist, HOB elevated General bed mobility comments: assist to elevate trunk into sitting; use of rail    Transfers  Overall transfer level: Needs assistance Equipment used: Rolling walker (2 wheeled) Transfers: Sit to/from Stand Sit to Stand: Min assist, Min guard Stand pivot transfers: Min guard General transfer comment: cues for safe hand placement; assist to power up initial stand and min guard after X 2 trials from recliner    Ambulation / Gait / Stairs / Wheelchair Mobility  Ambulation/Gait Ambulation/Gait assistance: Min guard(+2 for chair follow) Gait Distance (Feet): (150 ft with 2 seated rest breaks) Assistive device: Rolling walker (2 wheeled) Gait Pattern/deviations: Step-through pattern, Trunk flexed, Wide base of support, Decreased step length - right, Decreased step  length - left, Decreased weight shift to right, Decreased dorsiflexion - right General Gait Details: assist to steady; chair follow due to decreased activity tolerance; pt SOB with mobility and required seated rest breaks  Gait velocity: reduced Gait velocity interpretation: <1.31 ft/sec, indicative of household ambulator    Posture / Balance Dynamic Sitting Balance Sitting balance - Comments: steady sitting EOB Balance Overall balance assessment: Needs assistance Sitting-balance support: No upper extremity supported, Feet supported Sitting balance-Leahy Scale: Good Sitting balance - Comments: steady sitting EOB Standing balance support: Bilateral upper extremity supported Standing balance-Leahy Scale: Poor Standing balance comment: reliant on RW    Special needs/care consideration BiPAP/CPAP  Yes CPAP CPM  Continuous Drip IV  Dialysis          Life Vest  Oxygen  Special Bed  Air overlay bed on acute Trach Size  Wound Vac  Skin    See acute pictures 11/10/2019 by Merlene Pulling, PA-C                         Bowel mgmt: LBM 1/3  continent Bladder mgmt: continent Diabetic mgmt:  Behavioral consideration  Chemo/radiation  Designated visitor is wife   Previous Environmental health practitioner  Living Arrangements: Spouse/significant other  Lives With: Spouse Available Help at Discharge: Family, Available 24 hours/day Type of Home: House Home Layout: One level Home Access: Stairs to enter Entrance Stairs-Rails: Left Entrance Stairs-Number of Steps: 3 Bathroom Shower/Tub: Multimedia programmer: Handicapped height Bathroom Accessibility: Yes How Accessible: Accessible via walker Clutier: No  Discharge Living Setting Plans for Discharge Living Setting: Patient's home, Lives with (comment)(wife) Type of Home at Discharge: House Discharge Home Layout: One level Discharge Home Access: Stairs to enter Entrance Stairs-Number of Steps: 3 Discharge Bathroom Shower/Tub: Walk-in shower Discharge Bathroom Toilet: Handicapped height Discharge Bathroom Accessibility: Yes How Accessible: Accessible via walker Does the patient have any problems obtaining your medications?: No  Social/Family/Support Systems Patient Roles: Spouse, Parent Contact Information: wife, Altha Harm Anticipated Caregiver: wife Anticipated Ambulance person Information: see above Ability/Limitations of Caregiver: wife retired , no limitations Caregiver Availability: 24/7 Discharge Plan Discussed with Primary Caregiver: Yes Is Caregiver In Agreement with Plan?: Yes Does Caregiver/Family have Issues with Lodging/Transportation while Pt is in Rehab?: No  Goals/Additional Needs Patient/Family Goal for Rehab: Mod I with PT and OT Expected length of stay: ELOS 7 to 10 days Equipment Needs: air overlay bed Pt/Family Agrees to Admission and willing to participate: Yes Program Orientation Provided & Reviewed with Pt/Caregiver Including Roles  & Responsibilities: Yes  Decrease burden of Care through IP rehab admission:   Possible need for  SNF placement upon discharge: wife and patient refuse SNF option  Patient Condition: I have reviewed medical records from [facility] , spoken with patient and spouse. I met with patient at the bedside for inpatient rehabilitation assessment.  Patient will benefit from ongoing PT and OT, can actively participate in 3 hours of therapy a day 5 days of the week, and can make measurable gains during the admission.  Patient will also benefit from the coordinated team approach during an Inpatient Acute Rehabilitation admission.  The patient will receive intensive therapy as well as Rehabilitation physician, nursing, social worker, and care management interventions.  Due to bladder management, bowel management, safety, skin/wound care, disease management, medication administration, pain management and patient education the patient requires 24 hour a day rehabilitation nursing.  The patient is currently min assist with mobility and  basic ADLs.  Discharge setting and therapy post discharge at home with home health is anticipated.  Patient has agreed to participate in the Acute Inpatient Rehabilitation Program and will admit today.  Preadmission Screen Completed By:  Cleatrice Burke, 11/11/2019 10:12 AM ______________________________________________________________________   Discussed status with Dr. Dagoberto Ligas on  11/11/2019 at  1013 and received approval for admission today.  Admission Coordinator:  Cleatrice Burke, RN, time  1275 Date  11/11/2019   Assessment/Plan: Diagnosis: 1. Does the need for close, 24 hr/day Medical supervision in concert with the patient's rehab needs make it unreasonable for this patient to be served in a less intensive setting? Yes 2. Co-Morbidities requiring supervision/potential complications: Afib on Eliquis, CAD, diastolic CHF, R foot drop, Acute blood loss anemia, Morbid obesity with BMI of 43; OSA on CPAP 3. Due to bladder management, bowel management, safety,  skin/wound care, disease management, medication administration, pain management and patient education, does the patient require 24 hr/day rehab nursing? Yes 4. Does the patient require coordinated care of a physician, rehab nurse, PT, OT, and SLP to address physical and functional deficits in the context of the above medical diagnosis(es)? Yes Addressing deficits in the following areas: balance, endurance, locomotion, strength, transferring, bathing, dressing, feeding, grooming and toileting 5. Can the patient actively participate in an intensive therapy program of at least 3 hrs of therapy 5 days a week? Yes 6. The potential for patient to make measurable gains while on inpatient rehab is good 7. Anticipated functional outcomes upon discharge from inpatient rehab: modified independent PT, modified independent OT, n/a SLP 8. Estimated rehab length of stay to reach the above functional goals is: 7-1-0 days 9. Anticipated discharge destination: Home 10. Overall Rehab/Functional Prognosis: good   MD Signature:

## 2019-11-10 NOTE — Progress Notes (Signed)
     Subjective: 69 year old male with history of A. Fib on Eliquis, AAS, diastolic CHF, morbid obesity, spinal stenosis, right knee replacement, and OSA presented to the ED on 12/20 with complaint of right leg pain with rash. He was taken to the OR on 12/26 for an irrigation and debridement of his right leg. There was concern for abscess on his right medial thigh, however needle aspiration was performed on this area and no pus or fluid was pulled from the area.  Patient states he is in no pain today. He is happy that he was able to walk down the hall with PT.  Objective:   VITALS:   Vitals:   11/09/19 1958 11/10/19 0426 11/10/19 0434 11/10/19 0915  BP: (!) 119/54 (!) 158/83  (!) 169/86  Pulse: 78 83  100  Resp: 16 20    Temp: 98.1 F (36.7 C) (!) 97.4 F (36.3 C)    TempSrc: Oral Oral    SpO2: 96% 97%    Weight:   (!) 145.9 kg   Height:        General:Sitting comfortably in chair, in no acute distress. Resp: no use of accessory musculature. GI: abdomen is soft and non-tender Skin:Please see picture below. Musculoskeletal:EHL and FHL of right lower extremity intact.Able to lift his right leg 5-8 inches off of bed.Sensation and capillary refill intact distally.            Lab Results  Component Value Date   WBC 8.9 11/10/2019   HGB 7.7 (L) 11/10/2019   HCT 24.1 (L) 11/10/2019   MCV 92.0 11/10/2019   PLT 497 (H) 11/10/2019   BMET    Component Value Date/Time   NA 137 11/10/2019 0524   NA 138 08/05/2019 1034   K 3.8 11/10/2019 0524   CL 101 11/10/2019 0524   CL 101 08/08/2015 0000   CO2 26 11/10/2019 0524   CO2 32 08/08/2015 0000   GLUCOSE 116 (H) 11/10/2019 0524   BUN 20 11/10/2019 0524   BUN 18 08/05/2019 1034   CREATININE 1.15 11/10/2019 0524   CREATININE 1.02 10/08/2016 1144   CALCIUM 8.5 (L) 11/10/2019 0524   CALCIUM 9.3 08/08/2015 0000   GFRNONAA >60 11/10/2019 0524   GFRNONAA 77 10/08/2016 1144   GFRAA >60 11/10/2019 0524   GFRAA 89  10/08/2016 1144     Assessment/Plan: 11 Days Post-Op   Principal Problem:   Necrotizing celluliitis of lower leg ([place]), right Active Problems:   Chronic congestive heart failure (HCC)   Longstanding persistent atrial fibrillation (HCC)   Acute blood loss anemia   Necrotizing celluliitis of right lower extremity: - celluliitis significantly improved over hospital stay, no new changes, no new bleeding since start of Eliquis - continue daily dressing changes after discharge - ok to discharge from orthopedic standpoint, plan to discharge to CIR once bed is available   Ventura Bruns 11/10/2019, 11:35 AM

## 2019-11-10 NOTE — Consult Note (Signed)
Cardiology Consultation:   Patient ID: Joel Dominguez MRN: [id number]; DOB: 1951/07/17  Admit date: 10/24/2019 Date of Consult: 11/10/2019  Primary Care Provider: Mellody Dance, DO Primary Cardiologist: No primary care provider on file.  Primary Electrophysiologist:  None    Patient Profile:   Joel Dominguez is a 69 y.o. male with a hx of Afib on Eliquis, h/o of remote stroke, diastolic CHF, moderate MR, morbid obesity, PAH, OSA on CPAP  who is being seen today for the evaluation of diuretic management and orthostatic symptoms at the request of Joel Dominguez.  History of Present Illness:   Joel Dominguez is followed by Joel Dominguez.  He has a history of chronic atrial fibrillation on Eliquis, mild aortic stenosis, right bundle branch block with LAHB, hypertension, and pulmonary hypertension. Patient first established care with Joel Dominguez in January 2019 with his main complaint being high blood pressure.  Per chart review he has never had a heart catheterization. Patient had an echo on 08/09/2015 at an outside hospital showing ejection fraction of 40 to 45%, moderately enlarged right atrium, RV wall motion moderately impaired, mild mitral regurgitation, LVH biatrial enlargement mild aortic stenosis, mild pulmonary hypertension. He was in A. fib at the time.  From chart review patient had EF of 50% in October 2017.    The patient had a tele- visit on 06/14/2019 with Joel Dominguez.  Blood pressure was at target.  He was doing well from a heart failure standpoint.  Follow follow-up echo cardiogram was ordered which showed EF of 50 to 55%, moderate LVH, no regional wall motion abnormalities, normal RV systolic function, left atrium was mildly dilated, negative mitral valve with moderate mitral regurgitation, mild stenosis of the aortic valve.   The patient was admitted to the hospital on 10/24/2019 for left leg cellulitis.  On presentation he was febrile with leukocytosis and underwent debridement of the skin  and subcutaneous tissue of the right lower extremity by orthopedics on 12/26.  He was being treated with antibiotics.  Nose swab came back positive for MRSA.  Anticoagulation was stopped due to significant bleeding and anemia but later restarted. He was discharged to inpatient rehab.   Yesterday the patient experienced orthostatic hypotension with dizziness and lightheadedness. BP reportedly dropped to the 80s/40s. He received 1 L fluid bolus and infusion overnight but patient was still orthostatic this morning.  Cardiology was consulted for diuretic management and orthostatic symptoms.   Labs:  K+ 3.8 Glucose 116 Creatinine 1.15 Calcium 8.5 Albumin 2.5 LFTs normal WBC 8.9 Hemoglobin 7.7 Platelets 497   Heart Pathway Score:     Past Medical History:  Diagnosis Date  . A-fib 10/08/2016  . Aortic stenosis 11/06/2017  . Atherosclerotic heart disease of native coronary artery with unspecified angina pectoris ([place]) 10/08/2016  . Cellulitis 11/2019   right lower extremity  . CHF (congestive heart failure) ([place])   . Chronic anticoagulation 10/08/2016  . Foot drop- R 10/08/2016   Patient known foot drop for many years due to lumbar spinal stenosis.-Over the past couple of months symptoms have gotten worse.  He feels he is tripping more and cannot lift his foot up as much. Toes are curling and calluses on end of toes.    - Did see spinal surgeon many yrs ago.  Declined sx at that time.     . Generalized OA 10/08/2016   Ortho doc-  Dr. Boston Dominguez in [place]- both knees replaced 08, 09.     . H/O noncompliance with medical treatment,  presenting hazards to health 10/22/2017  . Heart disease   . Hypertension   . Obesity, Class III, BMI 40-49.9 (morbid obesity) ([place]) 10/08/2016  . OSA (obstructive sleep apnea) 10/08/2016   Bi-PAP nitely- been 20+ yrs now  . Pulmonary hypertension ([place]) 10/08/2016  . Spinal stenosis, lumbar region, with neurogenic claudication 10/08/2016   Back specialist in [place]-    only  surgeon he would use- pt declined referral here for back pain mgt    . Stroke [facility])     Past Surgical History:  Procedure Laterality Date  . I & D EXTREMITY Right 10/30/2019   Procedure: IRRIGATION AND DEBRIDEMENT EXTREMITY, right leg possible wound vac placement;  Surgeon: Marchia Bond, MD;  Location: [place];  Dominguez: Orthopedics;  Laterality: Right;  . REPLACEMENT TOTAL KNEE Bilateral   . TIBIA FRACTURE SURGERY       Home Medications:  Prior to Admission medications   Medication Sig Start Date End Date Taking? Authorizing Provider  apixaban (ELIQUIS) 5 MG TABS tablet Take 1 tablet (5 mg total) by mouth 2 (two) times daily. 04/13/19  Yes Joel Priest, MD  bumetanide (BUMEX) 2 MG tablet TAKE 1 TABLET BY MOUTH 2 TIMES DAILY. Patient taking differently: Take 2 mg by mouth 2 (two) times daily.  09/22/19  Yes Joel Priest, MD  carvedilol (COREG) 25 MG tablet TAKE 1 TABLET BY MOUTH 2 TIMES DAILY WITH A MEAL. Patient taking differently: Take 25 mg by mouth 2 (two) times daily with a meal.  06/21/19  Yes Joel Dominguez, Joel Cork, MD  HYDROcodone-acetaminophen (NORCO) 10-325 MG tablet Take 1 tablet by mouth as needed for moderate pain.  07/15/17  Yes [provider]  Multiple Vitamin (MULTIVITAMIN) tablet Take 1 tablet by mouth daily.   Yes [provider]  spironolactone (ALDACTONE) 25 MG tablet Take 1 tablet (25 mg total) by mouth daily. 08/05/19  Yes Dominguez, Deborah, DO  telmisartan (MICARDIS) 80 MG tablet TAKE 1 TABLET BY MOUTH DAILY. Patient taking differently: Take 80 mg by mouth daily.  08/17/19  Yes Joel Priest, MD  Vitamin D, Cholecalciferol, 50 MCG (2000 UT) CAPS Take 50 mcg by mouth daily.   Yes [provider]  Vitamin D, Ergocalciferol, (DRISDOL) 1.25 MG (50000 UT) CAPS capsule TAKE 1 CAPSULE BY MOUTH EVERY 7 DAYS. Patient taking differently: Take 50,000 Units by mouth every 7 (seven) days.  03/19/19  Yes Dominguez, Neoma Laming, DO  bumetanide (BUMEX) 1 MG tablet  Take 1 tablet (1 mg total) by mouth 2 (two) times daily. 11/10/19   Joel Pert, MD  carvedilol (COREG) 6.25 MG tablet Take 1 tablet (6.25 mg total) by mouth 2 (two) times daily with a meal. 11/10/19   Joel Pert, MD  docusate sodium (COLACE) 100 MG capsule Take 1 capsule (100 mg total) by mouth 2 (two) times daily. 11/10/19   Joel Pert, MD  potassium chloride (KLOR-CON) 10 MEQ tablet Take 1 tablet (10 mEq total) by mouth 2 (two) times daily. 11/08/19   Joel Priest, MD  senna (SENOKOT) 8.6 MG TABS tablet Take 1 tablet (8.6 mg total) by mouth 2 (two) times daily. 11/10/19   Joel Pert, MD    Inpatient Medications: Scheduled Meds: . apixaban  5 mg Oral BID  . bumetanide  1 mg Oral BID  . carvedilol  6.25 mg Oral BID WC  . docusate sodium  100 mg Oral BID  . senna  1 tablet Oral BID   Continuous Infusions: . sodium chloride  250 mL (10/26/19 1204)  . sodium chloride     PRN Meds: sodium chloride, acetaminophen, bisacodyl, cyclobenzaprine, diphenhydrAMINE, labetalol, magnesium citrate, metoCLOPramide **OR** metoCLOPramide (REGLAN) injection, morphine injection, ondansetron **OR** ondansetron (ZOFRAN) IV, oxyCODONE, oxyCODONE, polyethylene glycol, zolpidem  Allergies:    Allergies  Allergen Reactions  . Cephalexin Rash    Social History:   Social History   Socioeconomic History  . Marital status: Married    Spouse name: Not on file  . Number of children: Not on file  . Years of education: Not on file  . Highest education level: Not on file  Occupational History  . Not on file  Tobacco Use  . Smoking status: Never Smoker  . Smokeless tobacco: Never Used  Substance and Sexual Activity  . Alcohol use: Not Currently  . Drug use: No  . Sexual activity: Not on file  Other Topics Concern  . Not on file  Social History Narrative  . Not on file   Social Determinants of Health   Financial Resource Strain:   . Difficulty of Paying Living Expenses: Not on file  Food Insecurity:   .  Worried About Charity fundraiser in the Last Year: Not on file  . Ran Out of Food in the Last Year: Not on file  Transportation Needs:   . Lack of Transportation (Medical): Not on file  . Lack of Transportation (Non-Medical): Not on file  Physical Activity:   . Days of Exercise per Week: Not on file  . Minutes of Exercise per Session: Not on file  Stress:   . Feeling of Stress : Not on file  Social Connections:   . Frequency of Communication with Friends and Family: Not on file  . Frequency of Social Gatherings with Friends and Family: Not on file  . Attends Religious Services: Not on file  . Active Member of Clubs or Organizations: Not on file  . Attends Archivist Meetings: Not on file  . Marital Status: Not on file  Intimate Partner Violence:   . Fear of Current or Ex-Partner: Not on file  . Emotionally Abused: Not on file  . Physically Abused: Not on file  . Sexually Abused: Not on file    Family History:   Family History  Problem Relation Age of Onset  . Congestive Heart Failure Mother   . Hypertension Mother   . Diabetes Mother   . Cancer Father        lung  . Congestive Heart Failure Sister   . Hypertension Sister   . Hypertension Brother   . Hypertension Sister   . Diabetes Brother      ROS:  Please see the history of present illness.  All other ROS reviewed and negative.     Physical Exam/Data:   Vitals:   11/10/19 0426 11/10/19 0434 11/10/19 0915 11/10/19 1141  BP: (!) 158/83  (!) 169/86 137/69  Pulse: 83  100 85  Resp: 20   19  Temp: (!) 97.4 F (36.3 C)   98 F (36.7 C)  TempSrc: Oral   Oral  SpO2: 97%   95%  Weight:  (!) 145.9 kg    Height:        Intake/Output Summary (Last 24 hours) at 11/10/2019 1449 Last data filed at 11/10/2019 1259 Gross per 24 hour  Intake 1133.75 ml  Output 875 ml  Net 258.75 ml   Last 3 Weights 11/10/2019 11/09/2019 11/08/2019  Weight (lbs) 321 lb 11.2 oz (No Data) (  No Data)  Weight (kg) 145.922 kg (No Data)  (No Data)     Body mass index is 43.63 kg/m.  General:  Well nourished, well developed WM in no acute distress HEENT: normal Lymph: no adenopathy Neck: no JVD Endocrine:  No thryomegaly Vascular: No carotid bruits; FA pulses 2+ bilaterally without bruits  Cardiac:  normal S1, S2; Irreg Irreg; systolic murmur  Lungs:  clear to auscultation bilaterally, no wheezing, rhonchi or rales  Abd: soft, nontender, no hepatomegaly  Ext: no edema Musculoskeletal:  No deformities, BUE and BLE strength normal and equal Skin: warm and dry  Neuro:  CNs 2-12 intact, no focal abnormalities noted Psych:  Normal affect   EKG:  The EKG was personally reviewed and demonstrates: 11/01/2019 A. fib 99 bpm known RBBB and LAFB, no acute changes Telemetry:  Telemetry was personally reviewed and demonstrates:  Afib, HR 80s with some elevation to 100s, 8 beats Vtach  Relevant CV Studies:  Echo 06/30/2019  1. The left ventricle has low normal systolic function, with an ejection fraction of 50-55%. The cavity size was normal. There is moderate concentric left ventricular hypertrophy. Left ventricular diastolic function could not be evaluated secondary to  atrial fibrillation. No evidence of left ventricular regional wall motion abnormalities.  2. The right ventricle has normal systolic function. The cavity was moderately enlarged. There is no increase in right ventricular wall thickness.  3. Left atrial size was mildly dilated.  4. The mitral valve is degenerative. There is moderate mitral annular calcification present. Mitral valve regurgitation is moderate by color flow Doppler. No evidence of mitral valve stenosis.  5. The aortic valve is tricuspid. Mild thickening of the aortic valve. Aortic valve regurgitation is mild by color flow Doppler. Mild stenosis of the aortic valve  6. The ascending aorta and aortic root are normal in size and structure.  Laboratory Data:  High Sensitivity Troponin:  No results for  input(s): TROPONINIHS in the last 720 hours.   Chemistry Recent Labs  Lab 11/08/19 0830 11/09/19 0425 11/10/19 0524  NA 136 135 137  K 3.9 3.9 3.8  CL 95* 95* 101  CO2 31 30 26   GLUCOSE 123* 128* 116*  BUN 21 23 20   CREATININE 1.40* 1.40* 1.15  CALCIUM 8.9 8.8* 8.5*  GFRNONAA 51* 51* >60  GFRAA 59* 59* >60  ANIONGAP 10 10 10     Recent Labs  Lab 11/09/19 0425 11/10/19 0524  PROT 8.1 8.0  ALBUMIN 2.7* 2.5*  AST 16 16  ALT 20 20  ALKPHOS 65 63  BILITOT 0.7 0.6   Hematology Recent Labs  Lab 11/08/19 0830 11/09/19 0425 11/10/19 0524  WBC 11.7* 9.8 8.9  RBC 2.85* 2.84* 2.62*  HGB 8.4* 8.4* 7.7*  HCT 26.8* 26.7* 24.1*  MCV 94.0 94.0 92.0  MCH 29.5 29.6 29.4  MCHC 31.3 31.5 32.0  RDW 15.5 15.7* 16.0*  PLT 564* 574* 497*   BNPNo results for input(s): BNP, PROBNP in the last 168 hours.  DDimer No results for input(s): DDIMER in the last 168 hours.   Radiology/Studies:  No results found. {  Assessment and Plan:   Orthostatic hypotension/H/o of HTN -Had orthostatic hypotension yesterday while standing. Patient felt lightheaded and dizzy. BP dropped to 80/40s pulse 101 when standing, received IV fluids overnight. Todays vital  Lying BP-156 over 87, pulse 85 Sitting BP 171/136, pulse 86 Standing BP 151/70, pulse 114 Standing at 3 minutes 170/82, pulse 69 -Was on ARB, spiro, carvedilol, Bumex before admission>> has  been decreased to carvedilol - Pressures have been reasonable, 137/69 - Electrolytes stable - creatinine was up the last 2 days which could have been from dehydration. Creatinine is improved after hydration.  - Given dizziness and orthostatic hypotension can consider decreasing Bumex dose. Patient is also severely deconditioned after long hospitalization patient needs to slowly increase activity. Also his current BP are mildly elevated and might require further titration of meds in the future.  Diastolic heart failure -At baseline was taking  telmisartan 80 mg, spironolactone 25 mg, carvedilol 25 mg twice daily, and Bumex 2 mg BID  - At discharge was prescribed carvedilol 6.25 mg twice daily and Bumex 1 mg twice daily - Patient is euvolemic on exam.  - Last echo 06/2019 showed EF 50-55%, no RWMA, mild dilated LA, RV moderately enlarged, mod MR, mild AR - Consider decreasing Bumex as above  Permanent atrial fibrillation - on Eliquis for anticoagulation.  Was held for anemia then restarted. -Hemoglobin today is 7.7, yesterday was 8.4.  If continues to drop may need to hold Eliquis again. - Md to see  Moderate MR/mild AS - per echo 06/2019  For questions or updates, please contact [facility] Please consult [email or web address] for contact info under     Signed, Sawyer Mentzer Ninfa Meeker, PA-C  11/10/2019 2:49 PM

## 2019-11-10 NOTE — Progress Notes (Signed)
Pharmacy note - antibiotics  Today will be day #14 of linezolid for Mr. Chea leg infection.  His WBC is 8.9 and Platelets are 497.   Asked Dr. Lupita Leash about length of therapy and he feels we can stop antibiotics today.  Linezolid order discontinued.  Heide Guile, PharmD, BCPS-AQ ID Clinical Pharmacist [phone]

## 2019-11-10 NOTE — Plan of Care (Signed)
°  Problem: Education: °Goal: Ability to demonstrate management of disease process will improve °Outcome: Progressing °Goal: Ability to verbalize understanding of medication therapies will improve °Outcome: Progressing °Goal: Individualized Educational Video(s) °Outcome: Progressing °  °

## 2019-11-11 ENCOUNTER — Inpatient Hospital Stay (HOSPITAL_COMMUNITY)
Admission: RE | Admit: 2019-11-11 | Discharge: 2019-11-17 | DRG: 945 | Disposition: A | Discharge status: Home Health | Payer: Medicare Other | Source: Intra-hospital | Attending: Physical Medicine and Rehabilitation | Admitting: Physical Medicine and Rehabilitation

## 2019-11-11 DIAGNOSIS — G4730 Sleep apnea, unspecified: Secondary | ICD-10-CM | POA: Diagnosis present

## 2019-11-11 DIAGNOSIS — L89156 Pressure-induced deep tissue damage of sacral region: Secondary | ICD-10-CM | POA: Diagnosis present

## 2019-11-11 DIAGNOSIS — M62838 Other muscle spasm: Secondary | ICD-10-CM | POA: Diagnosis present

## 2019-11-11 DIAGNOSIS — Z96653 Presence of artificial knee joint, bilateral: Secondary | ICD-10-CM | POA: Diagnosis present

## 2019-11-11 DIAGNOSIS — R5381 Other malaise: Principal | ICD-10-CM | POA: Diagnosis present

## 2019-11-11 DIAGNOSIS — L84 Corns and callosities: Secondary | ICD-10-CM | POA: Diagnosis present

## 2019-11-11 DIAGNOSIS — Z79891 Long term (current) use of opiate analgesic: Secondary | ICD-10-CM

## 2019-11-11 DIAGNOSIS — M48062 Spinal stenosis, lumbar region with neurogenic claudication: Secondary | ICD-10-CM | POA: Diagnosis present

## 2019-11-11 DIAGNOSIS — L97519 Non-pressure chronic ulcer of other part of right foot with unspecified severity: Secondary | ICD-10-CM | POA: Diagnosis present

## 2019-11-11 DIAGNOSIS — I4811 Longstanding persistent atrial fibrillation: Secondary | ICD-10-CM

## 2019-11-11 DIAGNOSIS — Z7901 Long term (current) use of anticoagulants: Secondary | ICD-10-CM

## 2019-11-11 DIAGNOSIS — Z6841 Body Mass Index (BMI) 40.0 and over, adult: Secondary | ICD-10-CM | POA: Diagnosis not present

## 2019-11-11 DIAGNOSIS — M726 Necrotizing fasciitis: Secondary | ICD-10-CM

## 2019-11-11 DIAGNOSIS — Z8249 Family history of ischemic heart disease and other diseases of the circulatory system: Secondary | ICD-10-CM

## 2019-11-11 DIAGNOSIS — M159 Polyosteoarthritis, unspecified: Secondary | ICD-10-CM | POA: Diagnosis present

## 2019-11-11 DIAGNOSIS — I878 Other specified disorders of veins: Secondary | ICD-10-CM | POA: Diagnosis present

## 2019-11-11 DIAGNOSIS — I951 Orthostatic hypotension: Secondary | ICD-10-CM | POA: Diagnosis present

## 2019-11-11 DIAGNOSIS — Z881 Allergy status to other antibiotic agents status: Secondary | ICD-10-CM

## 2019-11-11 DIAGNOSIS — I472 Ventricular tachycardia: Secondary | ICD-10-CM | POA: Diagnosis present

## 2019-11-11 DIAGNOSIS — Z833 Family history of diabetes mellitus: Secondary | ICD-10-CM | POA: Diagnosis not present

## 2019-11-11 DIAGNOSIS — G4733 Obstructive sleep apnea (adult) (pediatric): Secondary | ICD-10-CM | POA: Diagnosis present

## 2019-11-11 DIAGNOSIS — I5042 Chronic combined systolic (congestive) and diastolic (congestive) heart failure: Secondary | ICD-10-CM | POA: Diagnosis present

## 2019-11-11 DIAGNOSIS — M21379 Foot drop, unspecified foot: Secondary | ICD-10-CM | POA: Diagnosis present

## 2019-11-11 DIAGNOSIS — R7303 Prediabetes: Secondary | ICD-10-CM | POA: Diagnosis present

## 2019-11-11 DIAGNOSIS — I110 Hypertensive heart disease with heart failure: Secondary | ICD-10-CM | POA: Diagnosis present

## 2019-11-11 DIAGNOSIS — I272 Pulmonary hypertension, unspecified: Secondary | ICD-10-CM | POA: Diagnosis present

## 2019-11-11 DIAGNOSIS — M5417 Radiculopathy, lumbosacral region: Secondary | ICD-10-CM | POA: Diagnosis present

## 2019-11-11 DIAGNOSIS — Z809 Family history of malignant neoplasm, unspecified: Secondary | ICD-10-CM

## 2019-11-11 DIAGNOSIS — I482 Chronic atrial fibrillation, unspecified: Secondary | ICD-10-CM | POA: Diagnosis present

## 2019-11-11 DIAGNOSIS — R195 Other fecal abnormalities: Secondary | ICD-10-CM

## 2019-11-11 DIAGNOSIS — Z8673 Personal history of transient ischemic attack (TIA), and cerebral infarction without residual deficits: Secondary | ICD-10-CM | POA: Diagnosis not present

## 2019-11-11 DIAGNOSIS — D62 Acute posthemorrhagic anemia: Secondary | ICD-10-CM | POA: Diagnosis present

## 2019-11-11 DIAGNOSIS — L03115 Cellulitis of right lower limb: Secondary | ICD-10-CM | POA: Diagnosis present

## 2019-11-11 DIAGNOSIS — Z79899 Other long term (current) drug therapy: Secondary | ICD-10-CM

## 2019-11-11 DIAGNOSIS — I350 Nonrheumatic aortic (valve) stenosis: Secondary | ICD-10-CM | POA: Diagnosis present

## 2019-11-11 DIAGNOSIS — E66813 Obesity, class 3: Secondary | ICD-10-CM | POA: Diagnosis present

## 2019-11-11 DIAGNOSIS — I25119 Atherosclerotic heart disease of native coronary artery with unspecified angina pectoris: Secondary | ICD-10-CM | POA: Diagnosis present

## 2019-11-11 DIAGNOSIS — M21371 Foot drop, right foot: Secondary | ICD-10-CM | POA: Diagnosis present

## 2019-11-11 DIAGNOSIS — D473 Essential (hemorrhagic) thrombocythemia: Secondary | ICD-10-CM | POA: Diagnosis present

## 2019-11-11 LAB — CBC
HCT: 25.7 % — ABNORMAL LOW (ref 39.0–52.0)
Hemoglobin: 7.9 g/dL — ABNORMAL LOW (ref 13.0–17.0)
MCH: 28.9 pg (ref 26.0–34.0)
MCHC: 30.7 g/dL (ref 30.0–36.0)
MCV: 94.1 fL (ref 80.0–100.0)
Platelets: 513 10*3/uL — ABNORMAL HIGH (ref 150–400)
RBC: 2.73 MIL/uL — ABNORMAL LOW (ref 4.22–5.81)
RDW: 16.2 % — ABNORMAL HIGH (ref 11.5–15.5)
WBC: 8.7 10*3/uL (ref 4.0–10.5)
nRBC: 0 % (ref 0.0–0.2)

## 2019-11-11 LAB — COMPREHENSIVE METABOLIC PANEL
ALT: 18 U/L (ref 0–44)
AST: 16 U/L (ref 15–41)
Albumin: 2.6 g/dL — ABNORMAL LOW (ref 3.5–5.0)
Alkaline Phosphatase: 64 U/L (ref 38–126)
Anion gap: 5 (ref 5–15)
BUN: 15 mg/dL (ref 8–23)
CO2: 32 mmol/L (ref 22–32)
Calcium: 8.7 mg/dL — ABNORMAL LOW (ref 8.9–10.3)
Chloride: 100 mmol/L (ref 98–111)
Creatinine, Ser: 1.15 mg/dL (ref 0.61–1.24)
GFR calc Af Amer: >60 mL/min (ref >60)
GFR calc non Af Amer: >60 mL/min (ref >60)
Glucose, Bld: 117 mg/dL — ABNORMAL HIGH (ref 70–99)
Potassium: 4.1 mmol/L (ref 3.5–5.1)
Sodium: 137 mmol/L (ref 135–145)
Total Bilirubin: 0.6 mg/dL (ref 0.3–1.2)
Total Protein: 7.8 g/dL (ref 6.5–8.1)

## 2019-11-11 LAB — CK: Total CK: 26 U/L — ABNORMAL LOW (ref 49–397)

## 2019-11-11 SURGERY — IRRIGATION AND DEBRIDEMENT WOUND
Anesthesia: General | Site: Leg Upper | Laterality: Right

## 2019-11-11 MED ORDER — CARVEDILOL 12.5 MG PO TABS: 12.5000 mg | ORAL_TABLET | Freq: Two times a day (BID) | ORAL | Status: DC

## 2019-11-11 MED ORDER — TRAZODONE HCL 50 MG PO TABS
25.0000 mg | ORAL_TABLET | Freq: Every evening | ORAL | Status: DC | PRN
  Administered 2019-11-12 – 2019-11-16 (×5): 50 mg via ORAL
  Filled 2019-11-11 (×6): qty 1

## 2019-11-11 MED ORDER — DOCUSATE SODIUM 100 MG PO CAPS
100.0000 mg | ORAL_CAPSULE | Freq: Two times a day (BID) | ORAL | Status: DC
  Administered 2019-11-11 – 2019-11-17 (×12): 100 mg via ORAL
  Filled 2019-11-11 (×12): qty 1

## 2019-11-11 MED ORDER — FLEET ENEMA 7-19 GM/118ML RE ENEM: 1.0000 | ENEMA | Freq: Once | RECTAL | Status: DC | PRN

## 2019-11-11 MED ORDER — BISACODYL 10 MG RE SUPP: 10.0000 mg | Freq: Every day | RECTAL | Status: DC | PRN

## 2019-11-11 MED ORDER — APIXABAN 5 MG PO TABS
5.0000 mg | ORAL_TABLET | Freq: Two times a day (BID) | ORAL | Status: DC
  Administered 2019-11-11 – 2019-11-17 (×12): 5 mg via ORAL
  Filled 2019-11-11 (×12): qty 1

## 2019-11-11 MED ORDER — PROCHLORPERAZINE EDISYLATE 10 MG/2ML IJ SOLN: 5.0000 mg | Freq: Four times a day (QID) | INTRAMUSCULAR | Status: DC | PRN

## 2019-11-11 MED ORDER — CARVEDILOL 6.25 MG PO TABS
6.2500 mg | ORAL_TABLET | Freq: Once | ORAL | Status: AC
  Administered 2019-11-11: 15:00:00 6.25 mg via ORAL
  Filled 2019-11-11: qty 1

## 2019-11-11 MED ORDER — OXYCODONE HCL 5 MG PO TABS
10.0000 mg | ORAL_TABLET | ORAL | Status: DC | PRN
  Administered 2019-11-12: 15 mg via ORAL
  Administered 2019-11-12 – 2019-11-13 (×2): 10 mg via ORAL
  Administered 2019-11-13 – 2019-11-14 (×2): 15 mg via ORAL
  Administered 2019-11-14 – 2019-11-16 (×3): 10 mg via ORAL
  Filled 2019-11-11: qty 2
  Filled 2019-11-11 (×3): qty 3
  Filled 2019-11-11 (×2): qty 2
  Filled 2019-11-11: qty 3
  Filled 2019-11-11 (×3): qty 2

## 2019-11-11 MED ORDER — SENNA 8.6 MG PO TABS
1.0000 | ORAL_TABLET | Freq: Two times a day (BID) | ORAL | Status: DC
  Administered 2019-11-11 – 2019-11-17 (×12): 8.6 mg via ORAL
  Filled 2019-11-11 (×13): qty 1

## 2019-11-11 MED ORDER — ACETAMINOPHEN 325 MG PO TABS: 325.0000 mg | ORAL_TABLET | ORAL | Status: DC | PRN

## 2019-11-11 MED ORDER — PROCHLORPERAZINE MALEATE 5 MG PO TABS: 5.0000 mg | ORAL_TABLET | Freq: Four times a day (QID) | ORAL | Status: DC | PRN

## 2019-11-11 MED ORDER — HYDROCERIN EX CREA
TOPICAL_CREAM | Freq: Two times a day (BID) | CUTANEOUS | Status: DC
  Filled 2019-11-11 (×2): qty 113

## 2019-11-11 MED ORDER — GUAIFENESIN-DM 100-10 MG/5ML PO SYRP: 5.0000 mL | ORAL_SOLUTION | Freq: Four times a day (QID) | ORAL | Status: DC | PRN

## 2019-11-11 MED ORDER — BUMETANIDE 1 MG PO TABS
1.0000 mg | ORAL_TABLET | Freq: Every day | ORAL | Status: DC
  Administered 2019-11-12 – 2019-11-16 (×5): 1 mg via ORAL
  Filled 2019-11-11 (×6): qty 1

## 2019-11-11 MED ORDER — PROCHLORPERAZINE 25 MG RE SUPP: 12.5000 mg | Freq: Four times a day (QID) | RECTAL | Status: DC | PRN

## 2019-11-11 MED ORDER — CARVEDILOL 12.5 MG PO TABS
12.5000 mg | ORAL_TABLET | Freq: Two times a day (BID) | ORAL | Status: DC
  Administered 2019-11-11 – 2019-11-17 (×12): 12.5 mg via ORAL
  Filled 2019-11-11 (×13): qty 1

## 2019-11-11 MED ORDER — CYCLOBENZAPRINE HCL 5 MG PO TABS
5.0000 mg | ORAL_TABLET | Freq: Three times a day (TID) | ORAL | Status: DC | PRN
  Filled 2019-11-11: qty 1

## 2019-11-11 MED ORDER — DIPHENHYDRAMINE HCL 12.5 MG/5ML PO ELIX: 12.5000 mg | ORAL_SOLUTION | Freq: Four times a day (QID) | ORAL | Status: DC | PRN

## 2019-11-11 MED ORDER — POLYETHYLENE GLYCOL 3350 17 G PO PACK: 17.0000 g | PACK | Freq: Every day | ORAL | Status: DC | PRN

## 2019-11-11 MED ORDER — ALUM & MAG HYDROXIDE-SIMETH 200-200-20 MG/5ML PO SUSP: 30.0000 mL | ORAL | Status: DC | PRN

## 2019-11-11 NOTE — Progress Notes (Signed)
Occupational Therapy Treatment Patient Details Name: Tayt Lombardozzi MRN: [id number] DOB: 1951-09-29 Today's Date: 11/11/2019    History of present illness 69 year old male with history of A. fib on Eliquis, AAS, diastolic CHF, moderate MR, morbid obesity and OSA presented with right leg pain and swelling with blisters, admitted for RLE cellulitis. s/p excisional debridement 12/26.   OT comments  Pt in bed upon arrival, agreeable to OT. Attempted to take BP for orthostatics while pt in supine, however unable to get reading with 3 attempts using 2 BP cuffs. Pt sat EOB and stated he felt "hazy" for a few seconds then that subsided. Pt stood at EOB with RW and reports no dizziness or feeling "hazy" pt ambulated to doorway, to bathroom, to [facility] and to sink and to recliner without SS of BP dropping. Pt eager to d/c to CIR, OT will continue to follow acutely  Follow Up Recommendations  CIR;Supervision/Assistance - 24 hour    Equipment Recommendations  Other (comment)(TBD at next venue of care)    Recommendations for Other Services      Precautions / Restrictions Precautions Precautions: Fall Required Braces or Orthoses: Other Brace Other Brace: pt has orthotic for R drop foot however too swollen/painful to don this session Restrictions Weight Bearing Restrictions: No       Mobility Bed Mobility Overal bed mobility: Needs Assistance Bed Mobility: Supine to Sit       Sit to supine: Min assist   General bed mobility comments: assist to elevate trunk into sitting; use of rail  Transfers Overall transfer level: Needs assistance Equipment used: Rolling walker (2 wheeled) Transfers: Sit to/from Stand Sit to Stand: Min assist Stand pivot transfers: Min guard            Balance Overall balance assessment: Needs assistance Sitting-balance support: No upper extremity supported;Feet supported Sitting balance-Leahy Scale: Good     Standing balance support: Bilateral upper extremity  supported;During functional activity Standing balance-Leahy Scale: Poor                             ADL either performed or assessed with clinical judgement   ADL Overall ADL's : Needs assistance/impaired     Grooming: Wash/dry hands;Wash/dry face;Min guard;Standing           Upper Body Dressing : Supervision/safety;Set up;Sitting       Toilet Transfer: Min guard;Ambulation;RW;BSC   Toileting- Clothing Manipulation and Hygiene: Minimal assistance;Sitting/lateral lean;Sit to/from stand       Functional mobility during ADLs: Minimal assistance;Min guard;Rolling walker;Cueing for safety       Vision Patient Visual Report: No change from baseline     Perception     Praxis      Cognition Arousal/Alertness: Awake/alert Behavior During Therapy: WFL for tasks assessed/performed Overall Cognitive Status: Within Functional Limits for tasks assessed                                          Exercises     Shoulder Instructions       General Comments      Pertinent Vitals/ Pain       Pain Assessment: 0-10 Pain Score: 3  Pain Location: RLE Pain Descriptors / Indicators: Sore Pain Intervention(s): Monitored during session;Repositioned  Home Living Family/patient expects to be discharged to:: Private residence Living Arrangements: Spouse/significant other Available Help at Discharge:  Family;Available 24 hours/day Type of Home: House Home Access: Stairs to enter [employer] of Steps: 3 Entrance Stairs-Rails: Left                            Prior Functioning/Environment              Frequency  Min 2X/week        Progress Toward Goals  OT Goals(current goals can now be found in the care plan section)  Progress towards OT goals: Progressing toward goals  Acute Rehab OT Goals OT Goal Formulation: With patient  Plan Discharge plan remains appropriate    Co-evaluation                 AM-PAC  OT "6 Clicks" Daily Activity     Outcome Measure   Help from another person eating meals?: None Help from another person taking care of personal grooming?: A Little Help from another person toileting, which includes using toliet, bedpan, or urinal?: A Lot Help from another person bathing (including washing, rinsing, drying)?: A Lot Help from another person to put on and taking off regular upper body clothing?: A Little Help from another person to put on and taking off regular lower body clothing?: A Lot 6 Click Score: 16    End of Session Equipment Utilized During Treatment: Rolling walker;Gait belt;Other (comment)(BSC)  OT Visit Diagnosis: Unsteadiness on feet (R26.81);Other abnormalities of gait and mobility (R26.89) Pain - Right/Left: Right Pain - part of body: Leg;Ankle and joints of foot   Activity Tolerance Patient tolerated treatment well   Patient Left in chair;with call bell/phone within reach   Nurse Communication          Time: [id number] OT Time Calculation (min): 24 min  Charges: OT General Charges $OT Visit: 1 Visit OT Treatments $Self Care/Home Management : 8-22 mins $Therapeutic Activity: 8-22 mins     Britt Bottom 11/11/2019, 12:21 PM

## 2019-11-11 NOTE — H&P (Signed)
Physical Medicine and Rehabilitation Admission H&P    Chief Complaint  Patient presents with  . Functional declined due to debility/cellulitis/morbid obesity    HPI: Joel Dominguez is a 69 year old male with history of chronic A. Fib/mild aortic stenosis/CHF, OSA, lumbar stenosis with right foot drop, morbid obesity, chronic right 3rd toe ulcer/callus who started developing increasing edema with pain and blisters due to cellulitis and was admitted on 10/24/2019 for IV Vanc-->IV ampicillin the next day.  He continued to have persistent leucocytosis with progressive cellulitis and ID recommended change po Zyvox as well as elevation to help manage edema. Bumex increased to help with fluid overload due to acute on chronic diastolic CHF.  BLE dopplers negative for DVT and CT RLE negative for osteomyelitis.  Dr. Mardelle Matte consulted for input on necrotizing cellulitis and patient take to OR 12/26 for I and D of bulla with dead tissue from distal medial leg, distal lateral ankle an distal medial thigh--no purulence noted. Plastics consulted for input and recommended daily dressing changes with improvement in wounds.   He has completed 14 day course of Linezolid on 01/06 and wounds healing well with resolution of leucocytosis.  Hospital course significant for ABLA due to bleeding from RLE--Eliquis held briefly, orthostatic hypotension with drop in BP 80/40 treated with fluid bolus and Dr. Oval Linsey consulted for input. Bumex decreased as symptoms felt to be due to diuresis. He had few beast NSVT 1/7 am and coreg titrated upwards to 12.5 mg bid. Marland Kitchen  He is showing improvement in activity tolerance with decrease in DOE and lightheadedness but noted to be debilitated. CIR recommended due to functional decline.    Pt reports LBM today- denies constipation- Uses BiPAP from home- usually wear R AFO but can't currently due to wounds on RLE- has RLE spasms, even before surgery- but feels pain meds work when takes them-  didn't know had muscle spasms meds to take prn.  Review of Systems  Constitutional: Negative for chills and fever.  HENT: Negative for hearing loss and tinnitus.   Eyes: Negative for blurred vision and double vision.  Respiratory: Negative for cough and shortness of breath.   Cardiovascular: Positive for leg swelling. Negative for chest pain and palpitations.  Gastrointestinal: Negative for constipation, heartburn and nausea.  Genitourinary: Negative for dysuria and urgency.  Musculoskeletal: Positive for back pain and myalgias.  Skin: Negative for rash.  Neurological: Positive for focal weakness (right foot drop). Negative for dizziness, weakness and headaches.  Psychiatric/Behavioral: Negative for memory loss. The patient is nervous/anxious.   All other systems reviewed and are negative.    Past Medical History:  Diagnosis Date  . A-fib 10/08/2016  . Aortic stenosis 11/06/2017  . Atherosclerotic heart disease of native coronary artery with unspecified angina pectoris ([place]) 10/08/2016  . Cellulitis 11/2019   right lower extremity  . CHF (congestive heart failure) ([place])   . Chronic anticoagulation 10/08/2016  . Foot drop- R 10/08/2016   Patient known foot drop for many years due to lumbar spinal stenosis.-Over the past couple of months symptoms have gotten worse.  He feels he is tripping more and cannot lift his foot up as much. Toes are curling and calluses on end of toes.    - Did see spinal surgeon many yrs ago.  Declined sx at that time.     . Generalized OA 10/08/2016   Ortho doc-  Dr. [facility] in [place]- both knees replaced 08, 09.     . H/O noncompliance with  medical treatment, presenting hazards to health 10/22/2017  . Heart disease   . Hypertension   . Obesity, Class III, BMI 40-49.9 (morbid obesity) ([place]) 10/08/2016  . OSA (obstructive sleep apnea) 10/08/2016   Bi-PAP nitely- been 20+ yrs now  . Pulmonary hypertension ([place]) 10/08/2016  . Spinal stenosis, lumbar region, with  neurogenic claudication 10/08/2016   Back specialist in [place]-    only surgeon he would use- pt declined referral here for back pain mgt    . Stroke [facility])     Past Surgical History:  Procedure Laterality Date  . I & D EXTREMITY Right 10/30/2019   Procedure: IRRIGATION AND DEBRIDEMENT EXTREMITY, right leg possible wound vac placement;  Surgeon: Marchia Bond, MD;  Location: [facility];  Service: Orthopedics;  Laterality: Right;  . REPLACEMENT TOTAL KNEE Bilateral   . TIBIA FRACTURE SURGERY      Family History  Problem Relation Age of Onset  . Congestive Heart Failure Mother   . Hypertension Mother   . Diabetes Mother   . Cancer Father        lung  . Congestive Heart Failure Sister   . Hypertension Sister   . Hypertension Brother   . Hypertension Sister   . Diabetes Brother     Social History: Retired. Moved here 6 years ago from [place]. Retired Engineer, water. He reports that he has never smoked. He has never used smokeless tobacco. He reports previous alcohol use. He reports that he does not use drugs.  Lives in 1 story home with wife in [place], [place] with 2 STE no rail- no ramp- denies every smoking, doesn't drink EtOH or use illicits.  Allergies  Allergen Reactions  . Cephalexin Rash    Medications Prior to Admission  Medication Sig Dispense Refill  . apixaban (ELIQUIS) 5 MG TABS tablet Take 1 tablet (5 mg total) by mouth 2 (two) times daily. 180 tablet 3  . bumetanide (BUMEX) 2 MG tablet TAKE 1 TABLET BY MOUTH 2 TIMES DAILY. (Patient taking differently: Take 2 mg by mouth 2 (two) times daily. ) 180 tablet 1  . carvedilol (COREG) 25 MG tablet TAKE 1 TABLET BY MOUTH 2 TIMES DAILY WITH A MEAL. (Patient taking differently: Take 25 mg by mouth 2 (two) times daily with a meal. ) 180 tablet 1  . HYDROcodone-acetaminophen (NORCO) 10-325 MG tablet Take 1 tablet by mouth as needed for moderate pain.     . Multiple Vitamin (MULTIVITAMIN) tablet Take 1 tablet  by mouth daily.    Marland Kitchen spironolactone (ALDACTONE) 25 MG tablet Take 1 tablet (25 mg total) by mouth daily. 90 tablet 1  . telmisartan (MICARDIS) 80 MG tablet TAKE 1 TABLET BY MOUTH DAILY. (Patient taking differently: Take 80 mg by mouth daily. ) 90 tablet 1  . Vitamin D, Cholecalciferol, 50 MCG (2000 UT) CAPS Take 50 mcg by mouth daily.    . Vitamin D, Ergocalciferol, (DRISDOL) 1.25 MG (50000 UT) CAPS capsule TAKE 1 CAPSULE BY MOUTH EVERY 7 DAYS. (Patient taking differently: Take 50,000 Units by mouth every 7 (seven) days. ) 12 capsule 10    Drug Regimen Review  Drug regimen was reviewed and remains appropriate with no significant issues identified  Home: Home Living Family/patient expects to be discharged to:: Private residence Living Arrangements: Spouse/significant other Available Help at Discharge: Family, Available 24 hours/day Type of Home: House Home Access: Stairs to enter [employer] of Steps: 3 Entrance Stairs-Rails: Left Home Layout: One level Bathroom Shower/Tub: Walk-in  shower Bathroom Toilet: Handicapped height Bathroom Accessibility: Yes Home Equipment: Cane - quad, Shower seat, Grab bars - tub/shower  Lives With: Spouse   Functional History: Prior Function Level of Independence: Independent with assistive device(s) Gait / Transfers Assistance Needed: modified independent with quad cane ADL's / Homemaking Assistance Needed: spouse assist with LB dressing such as socks, otherwise mostly I with self care Comments: drives  Functional Status:  Mobility: Bed Mobility Overal bed mobility: Needs Assistance Bed Mobility: Supine to Sit Rolling: Modified independent (Device/Increase time) Supine to sit: Min assist, HOB elevated Sit to supine: Min assist, HOB elevated General bed mobility comments: assist to elevate trunk into sitting; use of rail Transfers Overall transfer level: Needs assistance Equipment used: Rolling walker (2 wheeled) Transfers: Sit  to/from Stand Sit to Stand: Min assist, Min guard Stand pivot transfers: Min guard General transfer comment: cues for safe hand placement; assist to power up initial stand and min guard after X 2 trials from recliner Ambulation/Gait Ambulation/Gait assistance: Min guard(+2 for chair follow) Gait Distance (Feet): (150 ft with 2 seated rest breaks) Assistive device: Rolling walker (2 wheeled) Gait Pattern/deviations: Step-through pattern, Trunk flexed, Wide base of support, Decreased step length - right, Decreased step length - left, Decreased weight shift to right, Decreased dorsiflexion - right General Gait Details: assist to steady; chair follow due to decreased activity tolerance; pt SOB with mobility and required seated rest breaks  Gait velocity: reduced Gait velocity interpretation: <1.31 ft/sec, indicative of household ambulator    ADL: ADL Overall ADL's : Needs assistance/impaired Eating/Feeding: Independent Grooming: Oral care, Wash/dry hands, Wash/dry face, Sitting, Supervision/safety, Min guard Grooming Details (indicate cue type and reason): difficulty with prolonged unsupported sitting Upper Body Bathing: Supervision/ safety, Min guard, Sitting Lower Body Bathing: Maximal assistance, Sitting/lateral leans, Sit to/from stand Upper Body Dressing : Supervision/safety, Min guard, Sitting Lower Body Dressing: Maximal assistance, Sitting/lateral leans, Sit to/from stand Toilet Transfer: Minimal assistance, Ambulation, RW Toilet Transfer Details (indicate cue type and reason): recliner>chair 5 feet away (x2) Toileting- Clothing Manipulation and Hygiene: Moderate assistance, Sitting/lateral lean, Sit to/from stand Functional mobility during ADLs: Moderate assistance, Rolling walker, Cueing for safety General ADL Comments: due to increased pain, decreased activity tolerance and strength pt requiring increased assistance for self care than baseline  Cognition: Cognition Overall  Cognitive Status: Within Functional Limits for tasks assessed Orientation Level: Oriented X4 Cognition Arousal/Alertness: Awake/alert Behavior During Therapy: WFL for tasks assessed/performed Overall Cognitive Status: Within Functional Limits for tasks assessed   Blood pressure (!) 148/63, pulse 84, temperature 97.9 F (36.6 C), temperature source Oral, resp. rate 20, height 6' (1.829 m), weight (!) 146.6 kg, SpO2 94 %. Physical Exam  Nursing note and vitals reviewed. Constitutional: He is oriented to person, place, and time. He appears well-developed and well-nourished.  Pt is a morbidly obese male sitting in bedside chair, wife in room- c/o RLE spasms that are painful; legs elevated in chair; eating lunch, NAD; has beard  HENT:  Head: Normocephalic and atraumatic.  Mouth/Throat: No oropharyngeal exudate.  Face symmetrical and tongue midline  Eyes: Pupils are equal, round, and reactive to light. EOM are normal. Right eye exhibits no discharge. Left eye exhibits no discharge. No scleral icterus.  Neck: No tracheal deviation present.  Cardiovascular:  borderline tachycardia, but regular rhythm  Respiratory: No stridor. No respiratory distress. He has no wheezes.  CTA B/L- no W/R/R  GI:  Soft, protuberant, NT, ND, (+)BS  Musculoskeletal:        General: Edema  present.     Cervical back: Normal range of motion and neck supple.     Comments: Well healed old B-TKR incisions.   UEs 5/5 in delt/biceps/triceps/WE/grip/finger abd  LEs- RLE_ HF 2-/5, KE 2/5, DF 0/5, PF 2-/5, EHL 0/5 LLE- 5-/5 in all same muscles tested in RLE  Neurological: He is alert and oriented to person, place, and time.  Sensation is intact in all 4 extremities to LT B/L  Skin: Skin is warm and dry.  Dry flaky skin on right shin and bilateral feet. Dry callus on R-3rd toe with underlying dried blood.  Open incision R inner distal thigh- ~ >1 cm- dried blood surrounding opening RLE lower calf and foot/ankle-  decreased redness, vaseline gauze- traces on skin; flaking skin all over- chronic venous stasis changes as well HAS backside breakdown- couldn't see in bedside chair.  Bruising all over UEs and has IV in L wrist- no signs of infiltration  Psychiatric: He has a normal mood and affect.  Appropriate affect              Results for orders placed or performed during the hospital encounter of 10/24/19 (from the past 48 hour(s))  CBC     Status: Abnormal   Collection Time: 11/10/19  5:24 AM  Result Value Ref Range   WBC 8.9 4.0 - 10.5 K/uL   RBC 2.62 (L) 4.22 - 5.81 MIL/uL   Hemoglobin 7.7 (L) 13.0 - 17.0 g/dL   HCT 24.1 (L) 39.0 - 52.0 %   MCV 92.0 80.0 - 100.0 fL   MCH 29.4 26.0 - 34.0 pg   MCHC 32.0 30.0 - 36.0 g/dL   RDW 16.0 (H) 11.5 - 15.5 %   Platelets 497 (H) 150 - 400 K/uL   nRBC 0.0 0.0 - 0.2 %    Comment: Performed at [facility], [address] [place], [place] [address]  Comprehensive metabolic panel     Status: Abnormal   Collection Time: 11/10/19  5:24 AM  Result Value Ref Range   Sodium 137 135 - 145 mmol/L   Potassium 3.8 3.5 - 5.1 mmol/L   Chloride 101 98 - 111 mmol/L   CO2 26 22 - 32 mmol/L   Glucose, Bld 116 (H) 70 - 99 mg/dL   BUN 20 8 - 23 mg/dL   Creatinine, Ser 1.15 0.61 - 1.24 mg/dL   Calcium 8.5 (L) 8.9 - 10.3 mg/dL   Total Protein 8.0 6.5 - 8.1 g/dL   Albumin 2.5 (L) 3.5 - 5.0 g/dL   AST 16 15 - 41 U/L   ALT 20 0 - 44 U/L   Alkaline Phosphatase 63 38 - 126 U/L   Total Bilirubin 0.6 0.3 - 1.2 mg/dL   GFR calc non Af Amer >60 >60 mL/min   GFR calc Af Amer >60 >60 mL/min   Anion gap 10 5 - 15    Comment: Performed at [facility] [address] [place], [place] [address]  CK     Status: Abnormal   Collection Time: 11/11/19  5:10 AM  Result Value Ref Range   Total CK 26 (L) 49 - 397 U/L    Comment: Performed at [facility], [place] [address], [place] [address]  CBC     Status: Abnormal   Collection Time: 11/11/19   5:10 AM  Result Value Ref Range   WBC 8.7 4.0 - 10.5 K/uL   RBC 2.73 (L) 4.22 - 5.81  MIL/uL   Hemoglobin 7.9 (L) 13.0 - 17.0 g/dL   HCT 25.7 (L) 39.0 - 52.0 %   MCV 94.1 80.0 - 100.0 fL   MCH 28.9 26.0 - 34.0 pg   MCHC 30.7 30.0 - 36.0 g/dL   RDW 16.2 (H) 11.5 - 15.5 %   Platelets 513 (H) 150 - 400 K/uL   nRBC 0.0 0.0 - 0.2 %    Comment: Performed at [facility] [address], [place], [place] [address]  Comprehensive metabolic panel     Status: Abnormal   Collection Time: 11/11/19  5:10 AM  Result Value Ref Range   Sodium 137 135 - 145 mmol/L   Potassium 4.1 3.5 - 5.1 mmol/L   Chloride 100 98 - 111 mmol/L   CO2 32 22 - 32 mmol/L   Glucose, Bld 117 (H) 70 - 99 mg/dL   BUN 15 8 - 23 mg/dL   Creatinine, Ser 1.15 0.61 - 1.24 mg/dL   Calcium 8.7 (L) 8.9 - 10.3 mg/dL   Total Protein 7.8 6.5 - 8.1 g/dL   Albumin 2.6 (L) 3.5 - 5.0 g/dL   AST 16 15 - 41 U/L   ALT 18 0 - 44 U/L   Alkaline Phosphatase 64 38 - 126 U/L   Total Bilirubin 0.6 0.3 - 1.2 mg/dL   GFR calc non Af Amer >60 >60 mL/min   GFR calc Af Amer >60 >60 mL/min   Anion gap 5 5 - 15    Comment: Performed at [facility], [place] [address], [place], [place] [address]   No results found.     Medical Problem List and Plan: 1.  Impaired ADLs/mobility and function secondary to debility due to cellulitis of RLE/s/p I&D s/p Linezolid x 14 days; and LE weakness  -patient may  shower  -ELOS/Goals: 7-10 days; Mod I 2.  Antithrombotics: -DVT/anticoagulation:  Pharmaceutical: Other (comment)--Eliquis  -antiplatelet therapy: N/A 3. Pain Management:  Continue oxycodone prn. Will add flexeril prn for spasms.  4. Mood: LCSW to follow for evaluation and support.   -antipsychotic agents: N/A 5. Neuropsych: This patient is capable of making decisions on his own behalf. 6. Skin/Wound Care: Air mattress overlay for DTI on sacrum. Resume soaks to callus on right 3rd toe. Add protein supplement to help promote wound  healing. Will likely need dressing changes to RLE- will discuss with previous team 7. Fluids/Electrolytes/Nutrition: Monitor I/O. Check lytes in am.  8. Necrotizing cellulitis: Leucocytosis has resolved and thrombocytosis resolving. Continue daily dressing changes; off Linezolid after 14 days 9. A fib/few beats NSVT: Monitor HR tid. On Eliquis and coreg bid- Coreg was been reduced/dose due to orthostasis.  Continue to monitor for orthostatic symptoms.  10. Chronic diastolic CHF: Compensated. Bumex resumed at 1mg  on 01/06. Aldactone and telmisartan on hold--monitor weight daily and for any signs of overload. On Coreg bid.  11. OSA: Compliant with BiPAP use. 12. Morbid obesity: BMI 44--educate on importance of weight loss to help promote health and mobility.  13. ABLA: Continue to trend H/H. Will order stool guaiacs.  14. Acute renal failure: Has resolved.  Will monitor to make sure no additional issues.  15. RLE foot drop- has R AFO but cannot use due to wounds on RLE- will d/w PT for gait. 16. RLE muscle spasms- do Flexeril as needed.   Bary Leriche, PA-C 11/11/2019  I have personally performed a face to face diagnostic evaluation of this patient and formulated the key components  of the plan.  Additionally, I have personally reviewed laboratory data, imaging studies, as well as relevant notes and concur with the physician assistant's documentation above.   The patient's status has not changed from the original H&P.  Any changes in documentation from the acute care chart have been noted above.

## 2019-11-11 NOTE — Progress Notes (Signed)
Pt had 16 beats of PVCs this morning while getting vital signs. Pt stable. MD paged. Will continue monitoring.

## 2019-11-11 NOTE — Progress Notes (Signed)
Pt was admitted and oriented to unit.   Accompanied by wife. Pts History and medications were reviewed at bedside. No complaints or concerns to report at this time. Amanda Cockayne, LPN

## 2019-11-11 NOTE — TOC Progression Note (Signed)
Transition of Care [facility]) - Progression Note    Patient Details  Name: Joel Dominguez MRN: [id number] Date of Birth: 06-16-1951  Transition of Care [facility]) CM/SW Contact  Zenon Mayo, RN Phone Number: 11/11/2019, 10:07 AM  Clinical Narrative:    Patient to discharge to CIR today, per Pamala Hurry they do have a bed for this patient.     Barriers to Discharge: No Barriers Identified  Expected Discharge Plan and Services           Expected Discharge Date: 11/10/19                                     Social Determinants of Health (SDOH) Interventions    Readmission Risk Interventions No flowsheet data found.

## 2019-11-11 NOTE — Progress Notes (Signed)
Courtney Heys, MD  Physician  Physical Medicine and Rehabilitation  PMR Pre-admission  Signed  Date of Service:  11/10/2019  1:03 PM      Related encounter: ED to Hosp-Admission (Current) from 10/24/2019 in [facility]      Signed        Show:Clear all '[x]'$ Manual'[x]'$ Template'[]'$ Copied  Added by: '[x]'$ Harriet Bollen, Vertis Kelch, RN'[x]'$ Courtney Heys, MD  '[]'$ Hover for details PMR Admission Coordinator Pre-Admission Assessment   Patient: Joel Dominguez is an 69 y.o., male MRN: [id number] DOB: April 11, 1952Height: 6' (182.9 cm) Weight: (!) 146.6 kg   Insurance Information HMO:     PPO:      PCP:      IPA:     80/20:      OTHER:  PRIMARY: Medicare a and b      Policy#: [id number]      Subscriber: pt Benefits:  Phone #: passport one online     Name: 11/10/2019 Eff. Date: 06/04/2009     Deduct: $1484      Out of Pocket Max: none      Life Max: none CIR: 100%      SNF: 20 full days Outpatient: 80%     Co-Pay: 20% Home Health: 100%      Co-Pay: none DME: 80%     Co-Pay: 20% Providers: pt choice  SECONDARY: [employer]      [phone]      Subscriber: pt   Medicaid Application Date:       Case Manager:  Disability Application Date:       Case Worker:    The "Data Collection Information Summary" for patients in Inpatient Rehabilitation Facilities with attached "Privacy Act [place] Records" was provided and verbally reviewed with: Patient and Family   Emergency Contact Information         Contact Information     Name Relation Home Work Mobile    Obara,Christine Spouse [phone]             Current Medical History  Patient Admitting Diagnosis: Debility   History of Present Illness: 69 year old male with medical history of A. Fib, aortic stenosis, atherosclerotic heart disease, diastolic CHF, moderate mitral regurgitation, chronic anticoagulation and morbid obesity. Presented 10/24/2019 with complaints of right leg pain and rash. Had previously  presented to ED and was diagnosed with dehydration, diuretics held and told to continue compression socks for chronic bilateral leg swelling. Patient febrile with leukocytosis 20 K underwent excisional debridement of the skin and subcutaneous tissue of the right lower extremity by Ortho, Dr. Mardelle Matte, on 12/26.  Patient on antibiotics, wound cultures no growth so far. Had to discontinue anticoagulation due to significant bleeding and anemia. Dressing applied by orthopedics, and patient continued on linezolid. Subsequently dressings removed with no bleeding and improvement of wounds. Further surgery including plastics consulted, Dr. Marla Roe canceled due to improvement of wounds. Eliquis to be resumed.To discontinue antibiotics today for patient received 14 days of Zyvox. Patient with episode of ortho stasis and dizziness. On 1/5 patient given 1 liter fluid bolus and infusion overnight. Per family request, cardiology was consulted. Coreg decreased and Bumex held for now with cardiology consult on 1/6 and medication adjustments made. To monitor hemoglobin now that Eliquis resumed.   Patient's medical [facility] from [facility] has been reviewed by the rehabilitation admission coordinator and physician.   Past Medical History      Past Medical History:  Diagnosis Date  . A-fib 10/08/2016  .  Aortic stenosis 11/06/2017  . Atherosclerotic heart disease of native coronary artery with unspecified angina pectoris ([place]) 10/08/2016  . Cellulitis 11/2019    right lower extremity  . CHF (congestive heart failure) ([place])    . Chronic anticoagulation 10/08/2016  . Foot drop- R 10/08/2016    Patient known foot drop for many years due to lumbar spinal stenosis.-Over the past couple of months symptoms have gotten worse.  He feels he is tripping more and cannot lift his foot up as much. Toes are curling and calluses on end of toes.    - Did see spinal surgeon many yrs ago.  Declined sx at that time.     . Generalized  OA 10/08/2016    Ortho doc-  Dr. [facility] in [place]- both knees replaced 08, 09.     . H/O noncompliance with medical treatment, presenting hazards to health 10/22/2017  . Heart disease    . Hypertension    . Obesity, Class III, BMI 40-49.9 (morbid obesity) ([place]) 10/08/2016  . OSA (obstructive sleep apnea) 10/08/2016    Bi-PAP nitely- been 20+ yrs now  . Pulmonary hypertension ([place]) 10/08/2016  . Spinal stenosis, lumbar region, with neurogenic claudication 10/08/2016    Back specialist in [place]-    only surgeon he would use- pt declined referral here for back pain mgt    . Stroke [facility])        Family History   family history includes Cancer in his father; Congestive Heart Failure in his mother and sister; Diabetes in his brother and mother; Hypertension in his brother, mother, sister, and sister.   Prior Rehab/Hospitalizations Has the patient had prior rehab or hospitalizations prior to admission? Yes   Has the patient had major surgery during 100 days prior to admission? Yes              Current Medications   Current Facility-Administered Medications:  .  0.9 %  sodium chloride infusion, , Intravenous, PRN, Ventura Bruns, PA-C, Last Rate: 10 mL/hr at 10/26/19 1204, 250 mL at 10/26/19 1204 .  acetaminophen (TYLENOL) tablet 325-650 mg, 325-650 mg, Oral, Q6H PRN, Merlene Pulling K, PA-C .  apixaban (ELIQUIS) tablet 5 mg, 5 mg, Oral, BID, Kc, Ramesh, MD, 5 mg at 11/11/19 0924 .  bisacodyl (DULCOLAX) suppository 10 mg, 10 mg, Rectal, Daily PRN, Owens Shark, Blaine K, PA-C .  bumetanide (BUMEX) tablet 1 mg, 1 mg, Oral, Daily, Skeet Latch, MD, 1 mg at 11/11/19 0924 .  carvedilol (COREG) tablet 12.5 mg, 12.5 mg, Oral, BID WC, Skeet Latch, MD .  carvedilol (COREG) tablet 6.25 mg, 6.25 mg, Oral, Once, Skeet Latch, MD .  cyclobenzaprine (FLEXERIL) tablet 5 mg, 5 mg, Oral, TID PRN, Merlene Pulling K, PA-C, 5 mg at 11/02/19 2778 .  diphenhydrAMINE (BENADRYL) 12.5 MG/5ML elixir 12.5-25 mg,  12.5-25 mg, Oral, Q4H PRN, Owens Shark, Blaine K, PA-C .  docusate sodium (COLACE) capsule 100 mg, 100 mg, Oral, BID, Merlene Pulling K, PA-C, 100 mg at 11/11/19 0924 .  labetalol (NORMODYNE) injection 5 mg, 5 mg, Intravenous, Q4H PRN, Kc, Ramesh, MD .  magnesium citrate solution 1 Bottle, 1 Bottle, Oral, Once PRN, Owens Shark, Blaine K, PA-C .  metoCLOPramide (REGLAN) tablet 5-10 mg, 5-10 mg, Oral, Q8H PRN **OR** metoCLOPramide (REGLAN) injection 5-10 mg, 5-10 mg, Intravenous, Q8H PRN, Owens Shark, Blaine K, PA-C .  morphine 2 MG/ML injection 1 mg, 1 mg, Intravenous, Q3H PRN, Georgette Shell, MD, 1 mg at 11/06/19 1842 .  ondansetron (ZOFRAN) tablet  4 mg, 4 mg, Oral, Q6H PRN **OR** ondansetron (ZOFRAN) injection 4 mg, 4 mg, Intravenous, Q6H PRN, Owens Shark, Blaine K, PA-C .  oxyCODONE (Oxy IR/ROXICODONE) immediate release tablet 10-15 mg, 10-15 mg, Oral, Q4H PRN, Merlene Pulling K, PA-C, 10 mg at 11/10/19 1417 .  oxyCODONE (Oxy IR/ROXICODONE) immediate release tablet 5-10 mg, 5-10 mg, Oral, Q4H PRN, Merlene Pulling K, PA-C, 5 mg at 11/09/19 1522 .  polyethylene glycol (MIRALAX / GLYCOLAX) packet 17 g, 17 g, Oral, Daily PRN, Ventura Bruns, PA-C .  senna (SENOKOT) tablet 8.6 mg, 1 tablet, Oral, BID, Merlene Pulling K, PA-C, 8.6 mg at 11/11/19 0924 .  sodium chloride 0.9 % bolus 500 mL, 500 mL, Intravenous, Once, Marchia Bond, MD .  zolpidem (AMBIEN) tablet 5 mg, 5 mg, Oral, QHS PRN, Merlene Pulling K, PA-C   Patients Current Diet:     Diet Order                      Diet - low sodium heart healthy           Diet regular Room service appropriate? Yes; Fluid consistency: Thin  Diet effective now                   Precautions / Restrictions Precautions Precautions: Fall Precaution Comments: drop in BP with activity Other Brace: pt has orthotic for R drop foot however too swollen/painful to don this session Restrictions Weight Bearing Restrictions: No    Has the patient had 2 or more falls or a fall with  injury in the past year? No   Prior Activity Level Limited Community (1-2x/wk): Mod I; drove; wife assisted with LB bathing and dressign   Prior Functional Level Self Care: Did the patient need help bathing, dressing, using the toilet or eating? Needed some help   Indoor Mobility: Did the patient need assistance with walking from room to room (with or without device)? Independent   Stairs: Did the patient need assistance with internal or external stairs (with or without device)? Independent   Functional Cognition: Did the patient need help planning regular tasks such as shopping or remembering to take medications? [place] / Equipment Home Assistive Devices/Equipment: Radio producer (specify quad or straight), Eyeglasses Home Equipment: Cane - quad, Shower seat, Grab bars - tub/shower   Prior Device Use: Indicate devices/aids used by the patient prior to current illness, exacerbation or injury? cane   Current Functional Level Cognition   Overall Cognitive Status: Within Functional Limits for tasks assessed Orientation Level: Oriented X4    Extremity Assessment (includes Sensation/Coordination)   Upper Extremity Assessment: Overall WFL for tasks assessed  Lower Extremity Assessment: Defer to PT evaluation     ADLs   Overall ADL's : Needs assistance/impaired Eating/Feeding: Independent Grooming: Oral care, Wash/dry hands, Wash/dry face, Sitting, Supervision/safety, Min guard Grooming Details (indicate cue type and reason): difficulty with prolonged unsupported sitting Upper Body Bathing: Supervision/ safety, Min guard, Sitting Lower Body Bathing: Maximal assistance, Sitting/lateral leans, Sit to/from stand Upper Body Dressing : Supervision/safety, Min guard, Sitting Lower Body Dressing: Maximal assistance, Sitting/lateral leans, Sit to/from stand Toilet Transfer: Minimal assistance, Ambulation, RW Toilet Transfer Details (indicate cue type and reason):  recliner>chair 5 feet away (x2) Toileting- Clothing Manipulation and Hygiene: Moderate assistance, Sitting/lateral lean, Sit to/from stand Functional mobility during ADLs: Moderate assistance, Rolling walker, Cueing for safety General ADL Comments: due to increased pain, decreased activity tolerance and strength pt requiring increased assistance for  self care than baseline     Mobility   Overal bed mobility: Needs Assistance Bed Mobility: Supine to Sit Rolling: Modified independent (Device/Increase time) Supine to sit: Min assist, HOB elevated Sit to supine: Min assist, HOB elevated General bed mobility comments: assist to elevate trunk into sitting; use of rail     Transfers   Overall transfer level: Needs assistance Equipment used: Rolling walker (2 wheeled) Transfers: Sit to/from Stand Sit to Stand: Min assist, Min guard Stand pivot transfers: Min guard General transfer comment: cues for safe hand placement; assist to power up initial stand and min guard after X 2 trials from recliner     Ambulation / Gait / Stairs / Wheelchair Mobility   Ambulation/Gait Ambulation/Gait assistance: Min guard(+2 for chair follow) Gait Distance (Feet): (150 ft with 2 seated rest breaks) Assistive device: Rolling walker (2 wheeled) Gait Pattern/deviations: Step-through pattern, Trunk flexed, Wide base of support, Decreased step length - right, Decreased step length - left, Decreased weight shift to right, Decreased dorsiflexion - right General Gait Details: assist to steady; chair follow due to decreased activity tolerance; pt SOB with mobility and required seated rest breaks  Gait velocity: reduced Gait velocity interpretation: <1.31 ft/sec, indicative of household ambulator     Posture / Balance Dynamic Sitting Balance Sitting balance - Comments: steady sitting EOB Balance Overall balance assessment: Needs assistance Sitting-balance support: No upper extremity supported, Feet supported Sitting  balance-Leahy Scale: Good Sitting balance - Comments: steady sitting EOB Standing balance support: Bilateral upper extremity supported Standing balance-Leahy Scale: Poor Standing balance comment: reliant on RW     Special needs/care consideration BiPAP/CPAP  Yes CPAP CPM  Continuous Drip IV  Dialysis          Life Vest  Oxygen  Special Bed  Air overlay bed on acute Trach Size  Wound Vac  Skin    See acute pictures 11/10/2019 by Merlene Pulling, PA-C                         Bowel mgmt: LBM 1/3 continent Bladder mgmt: continent Diabetic mgmt:  Behavioral consideration  Chemo/radiation  Designated visitor is wife    Previous Environmental health practitioner  Living Arrangements: Spouse/significant other  Lives With: Spouse Available Help at Discharge: Family, Available 24 hours/day Type of Home: House Home Layout: One level Home Access: Stairs to enter Entrance Stairs-Rails: Left Entrance Stairs-Number of Steps: 3 Bathroom Shower/Tub: Multimedia programmer: Handicapped height Bathroom Accessibility: Yes How Accessible: Accessible via walker Home Care Services: No   Discharge Living Setting Plans for Discharge Living Setting: Patient's home, Lives with (comment)(wife) Type of Home at Discharge: House Discharge Home Layout: One level Discharge Home Access: Stairs to enter Entrance Stairs-Number of Steps: 3 Discharge Bathroom Shower/Tub: Walk-in shower Discharge Bathroom Toilet: Handicapped height Discharge Bathroom Accessibility: Yes How Accessible: Accessible via walker Does the patient have any problems obtaining your medications?: No   Social/Family/Support Systems Patient Roles: Spouse, Parent Contact Information: wife, Altha Harm Anticipated Caregiver: wife Anticipated Ambulance person Information: see above Ability/Limitations of Caregiver: wife retired , no limitations Caregiver Availability: 24/7 Discharge Plan Discussed with Primary Caregiver: Yes Is Caregiver In  Agreement with Plan?: Yes Does Caregiver/Family have Issues with Lodging/Transportation while Pt is in Rehab?: No   Goals/Additional Needs Patient/Family Goal for Rehab: Mod I with PT and OT Expected length of stay: ELOS 7 to 10 days Equipment Needs: air overlay bed Pt/Family Agrees to Admission and willing to participate: Yes  Program Orientation Provided & Reviewed with Pt/Caregiver Including Roles  & Responsibilities: Yes   Decrease burden of Care through IP rehab admission:    Possible need for SNF placement upon discharge: wife and patient refuse SNF option   Patient Condition: I have reviewed medical records from [facility] , spoken with patient and spouse. I met with patient at the bedside for inpatient rehabilitation assessment.  Patient will benefit from ongoing PT and OT, can actively participate in 3 hours of therapy a day 5 days of the week, and can make measurable gains during the admission.  Patient will also benefit from the coordinated team approach during an Inpatient Acute Rehabilitation admission.  The patient will receive intensive therapy as well as Rehabilitation physician, nursing, social worker, and care management interventions.  Due to bladder management, bowel management, safety, skin/wound care, disease management, medication administration, pain management and patient education the patient requires 24 hour a day rehabilitation nursing.  The patient is currently min assist with mobility and basic ADLs.  Discharge setting and therapy post discharge at home with home health is anticipated.  Patient has agreed to participate in the Acute Inpatient Rehabilitation Program and will admit today.   Preadmission Screen Completed By:  Cleatrice Burke, 11/11/2019 10:12 AM ______________________________________________________________________   Discussed status with Dr. Dagoberto Ligas on  11/11/2019 at  1013 and received approval for admission today.   Admission Coordinator:   Cleatrice Burke, RN, time  9379 Date  11/11/2019    Assessment/Plan: Diagnosis: 1. Does the need for close, 24 hr/day Medical supervision in concert with the patient's rehab needs make it unreasonable for this patient to be served in a less intensive setting? Yes 2. Co-Morbidities requiring supervision/potential complications: Afib on Eliquis, CAD, diastolic CHF, R foot drop, Acute blood loss anemia, Morbid obesity with BMI of 43; OSA on CPAP 3. Due to bladder management, bowel management, safety, skin/wound care, disease management, medication administration, pain management and patient education, does the patient require 24 hr/day rehab nursing? Yes 4. Does the patient require coordinated care of a physician, rehab nurse, PT, OT, and SLP to address physical and functional deficits in the context of the above medical diagnosis(es)? Yes Addressing deficits in the following areas: balance, endurance, locomotion, strength, transferring, bathing, dressing, feeding, grooming and toileting 5. Can the patient actively participate in an intensive therapy program of at least 3 hrs of therapy 5 days a week? Yes 6. The potential for patient to make measurable gains while on inpatient rehab is good 7. Anticipated functional outcomes upon discharge from inpatient rehab: modified independent PT, modified independent OT, n/a SLP 8. Estimated rehab length of stay to reach the above functional goals is: 7-1-0 days 9. Anticipated discharge destination: Home 10. Overall Rehab/Functional Prognosis: good     MD Signature:          Revision History

## 2019-11-11 NOTE — Progress Notes (Signed)
Inpatient Rehabilitation Admissions Coordinator  I have bed available on CIR to admit pt. I met with patient at bedside and he is in agreement. I have notified Dr. Maren Beach, RN [place], Neoma Laming and SW, [place]. I will make the arrangements to admit today.  Danne Baxter, RN, MSN Rehab Admissions Coordinator [phone] 11/11/2019 10:15 AM

## 2019-11-11 NOTE — Progress Notes (Signed)
Progress Note  Patient Name: Joel Dominguez Date of Encounter: 11/11/2019  Primary Cardiologist: Shirlee More, MD   Subjective   Feeling well.  Anxious to start rehab and walking.  Orthostasis is improvng.   Inpatient Medications    Scheduled Meds: . apixaban  5 mg Oral BID  . bumetanide  1 mg Oral Daily  . carvedilol  6.25 mg Oral BID WC  . docusate sodium  100 mg Oral BID  . senna  1 tablet Oral BID   Continuous Infusions: . sodium chloride 250 mL (10/26/19 1204)  . sodium chloride     PRN Meds: sodium chloride, acetaminophen, bisacodyl, cyclobenzaprine, diphenhydrAMINE, labetalol, magnesium citrate, metoCLOPramide **OR** metoCLOPramide (REGLAN) injection, morphine injection, ondansetron **OR** ondansetron (ZOFRAN) IV, oxyCODONE, oxyCODONE, polyethylene glycol, zolpidem   Vital Signs    Vitals:   11/10/19 1141 11/10/19 1703 11/10/19 2100 11/11/19 0522  BP: 137/69 (!) 141/72 131/66 (!) 148/63  Pulse: 85 89 92 84  Resp: 19 18 20 20   Temp: 98 F (36.7 C) 97.6 F (36.4 C) 98.7 F (37.1 C) 97.9 F (36.6 C)  TempSrc: Oral  Oral Oral  SpO2: 95% 97% 93% 94%  Weight:    (!) 146.6 kg  Height:        Intake/Output Summary (Last 24 hours) at 11/11/2019 0955 Last data filed at 11/11/2019 0925 Gross per 24 hour  Intake 580 ml  Output 575 ml  Net 5 ml   Last 3 Weights 11/11/2019 11/10/2019 11/09/2019  Weight (lbs) 323 lb 3.2 oz 321 lb 11.2 oz (No Data)  Weight (kg) 146.603 kg 145.922 kg (No Data)      Telemetry    Atrial fibrillation.  Rate 80s-100s.  16 beats NSVT - Personally Reviewed  ECG    n/a - Personally Reviewed  Physical Exam   VS:  BP (!) 148/63 (BP Location: Left Arm)   Pulse 84   Temp 97.9 F (36.6 C) (Oral)   Resp 20   Ht 6' (1.829 m)   Wt (!) 146.6 kg   SpO2 94%   BMI 43.83 kg/m  , BMI Body mass index is 43.83 kg/m. GENERAL:  Well appearing HEENT: Pupils equal round and reactive, fundi not visualized, oral mucosa unremarkable NECK:  No jugular  venous distention, waveform within normal limits, carotid upstroke brisk and symmetric, no bruits, no thyromegaly LYMPHATICS:  No cervical adenopathy LUNGS:  Clear to auscultation bilaterally HEART:  Irregularly irregular.  PMI not displaced or sustained,S1 and S2 within normal limits, no S3, no S4, no clicks, no rubs, no murmurs ABD:  Flat, positive bowel sounds normal in frequency in pitch, no bruits, no rebound, no guarding, no midline pulsatile mass, no hepatomegaly, no splenomegaly EXT:  R LE wrapped.  No edema in L LE.  No cyanosis no clubbing SKIN:  No rashes no nodules NEURO:  Cranial nerves II through XII grossly intact, motor grossly intact throughout PSYCH:  Cognitively intact, oriented to person place and time   Labs    High Sensitivity Troponin:  No results for input(s): TROPONINIHS in the last 720 hours.    Chemistry Recent Labs  Lab 11/09/19 0425 11/10/19 0524 11/11/19 0510  NA 135 137 137  K 3.9 3.8 4.1  CL 95* 101 100  CO2 30 26 32  GLUCOSE 128* 116* 117*  BUN 23 20 15   CREATININE 1.40* 1.15 1.15  CALCIUM 8.8* 8.5* 8.7*  PROT 8.1 8.0 7.8  ALBUMIN 2.7* 2.5* 2.6*  AST 16 16 16  ALT 20 20 18   ALKPHOS 65 63 64  BILITOT 0.7 0.6 0.6  GFRNONAA 51* >60 >60  GFRAA 59* >60 >60  ANIONGAP 10 10 5      Hematology Recent Labs  Lab 11/09/19 0425 11/10/19 0524 11/11/19 0510  WBC 9.8 8.9 8.7  RBC 2.84* 2.62* 2.73*  HGB 8.4* 7.7* 7.9*  HCT 26.7* 24.1* 25.7*  MCV 94.0 92.0 94.1  MCH 29.6 29.4 28.9  MCHC 31.5 32.0 30.7  RDW 15.7* 16.0* 16.2*  PLT 574* 497* 513*    BNPNo results for input(s): BNP, PROBNP in the last 168 hours.   DDimer No results for input(s): DDIMER in the last 168 hours.   Radiology    No results found.  Cardiac Studies   Echo 06/30/19: IMPRESSIONS    1. The left ventricle has low normal systolic function, with an ejection fraction of 50-55%. The cavity size was normal. There is moderate concentric left ventricular hypertrophy.  Left ventricular diastolic function could not be evaluated secondary to  atrial fibrillation. No evidence of left ventricular regional wall motion abnormalities.  2. The right ventricle has normal systolic function. The cavity was moderately enlarged. There is no increase in right ventricular wall thickness.  3. Left atrial size was mildly dilated.  4. The mitral valve is degenerative. There is moderate mitral annular calcification present. Mitral valve regurgitation is moderate by color flow Doppler. No evidence of mitral valve stenosis.  5. The aortic valve is tricuspid. Mild thickening of the aortic valve. Aortic valve regurgitation is mild by color flow Doppler. Mild stenosis of the aortic valve  6. The ascending aorta and aortic root are normal in size and structure.   Patient Profile     Mr. Cerreta is a 26M with chronic atrial fibrillation, mild aortic stenosis, hypertension, chronic diastolic heart failure, CVA, and OSA admitted with cellulitis requiring debridement.  Cardiology was consulted for orthostatic hypotension and dizziness.  Assessment & Plan    # Orthostatic hypotension:  Improved.  Likely related to diuresis.  Bumex reduced to 1 mg daily.  He appears to be euvolemic.    # Essential hypertension:  Blood pressure is above goal.  Given that he is no longer orthostatic we will increase carvedilol to 12.5 mg twice daily.  # Chronic atrial fibrillation: # ?NSVT: Rate mostly controlled.  He had an episode of wide-complex tachycardia.  Unclear whether this was nonsustained ventricular tachycardia or atrial fibrillation with aberrancy.  More likely atrial fibrillation with aberrancy.  He remained asymptomatic.  Is home dose of carvedilol was reduced from 25 mg to 6.25 mg due to hypotension and orthostasis.  His blood pressure has been stable.  We will increase carvedilol back to 12.5 mg twice daily.  Eliquis was initially held due to bleeding postoperatively.  Hemoglobin dropped  after reinitiation but is now stable.  # OSA: Continue CPAP.  # Mild aortic stenosis:  Stable.  BP and volume management as above.   [facility] will sign off.   Medication Recommendations:  Increase carvedilol Other recommendations (labs, testing, etc):  none Follow up as an outpatient:  To be scheduled after discharge from CIR  For questions or updates, please contact [place] Please consult [email or web address] for contact info under        Signed, Skeet Latch, MD  11/11/2019, 9:55 AM

## 2019-11-11 NOTE — Plan of Care (Signed)
  Problem: Consults Goal: RH GENERAL PATIENT EDUCATION Description: See Patient Education module for education specifics. Outcome: Progressing   Problem: RH SKIN INTEGRITY Goal: RH STG SKIN FREE OF INFECTION/BREAKDOWN Description: No new breakdown with min assist  Outcome: Progressing Goal: RH STG MAINTAIN SKIN INTEGRITY WITH ASSISTANCE Description: STG Maintain Skin Integrity With [place]. Outcome: Progressing Goal: RH STG ABLE TO PERFORM INCISION/WOUND CARE W/ASSISTANCE Description: STG Able To Perform Incision/Wound Care With [employer]. Outcome: Progressing   Problem: RH PAIN MANAGEMENT Goal: RH STG PAIN MANAGED AT OR BELOW PT'S PAIN GOAL Description: < 3 out of 10.  Outcome: Progressing

## 2019-11-11 NOTE — Progress Notes (Signed)
Pt already wearing home CPAP when RT stopped by for rounds.

## 2019-11-12 ENCOUNTER — Inpatient Hospital Stay (HOSPITAL_COMMUNITY): Payer: PRIVATE HEALTH INSURANCE | Admitting: Occupational Therapy

## 2019-11-12 ENCOUNTER — Inpatient Hospital Stay (HOSPITAL_COMMUNITY): Payer: PRIVATE HEALTH INSURANCE | Admitting: Physical Therapy

## 2019-11-12 ENCOUNTER — Inpatient Hospital Stay (HOSPITAL_COMMUNITY): Payer: PRIVATE HEALTH INSURANCE

## 2019-11-12 DIAGNOSIS — R5381 Other malaise: Principal | ICD-10-CM

## 2019-11-12 LAB — CBC WITH DIFFERENTIAL/PLATELET
Abs Immature Granulocytes: 0.04 10*3/uL (ref 0.00–0.07)
Basophils Absolute: 0.1 10*3/uL (ref 0.0–0.1)
Basophils Relative: 1 %
Eosinophils Absolute: 0.2 10*3/uL (ref 0.0–0.5)
Eosinophils Relative: 3 %
HCT: 27 % — ABNORMAL LOW (ref 39.0–52.0)
Hemoglobin: 8.6 g/dL — ABNORMAL LOW (ref 13.0–17.0)
Immature Granulocytes: 1 %
Lymphocytes Relative: 20 %
Lymphs Abs: 1.5 10*3/uL (ref 0.7–4.0)
MCH: 29.7 pg (ref 26.0–34.0)
MCHC: 31.9 g/dL (ref 30.0–36.0)
MCV: 93.1 fL (ref 80.0–100.0)
Monocytes Absolute: 0.8 10*3/uL (ref 0.1–1.0)
Monocytes Relative: 10 %
Neutro Abs: 5 10*3/uL (ref 1.7–7.7)
Neutrophils Relative %: 65 %
Platelets: 470 10*3/uL — ABNORMAL HIGH (ref 150–400)
RBC: 2.9 MIL/uL — ABNORMAL LOW (ref 4.22–5.81)
RDW: 16 % — ABNORMAL HIGH (ref 11.5–15.5)
WBC: 7.6 10*3/uL (ref 4.0–10.5)
nRBC: 0 % (ref 0.0–0.2)

## 2019-11-12 LAB — COMPREHENSIVE METABOLIC PANEL
ALT: 16 U/L (ref 0–44)
AST: 15 U/L (ref 15–41)
Albumin: 2.6 g/dL — ABNORMAL LOW (ref 3.5–5.0)
Alkaline Phosphatase: 60 U/L (ref 38–126)
Anion gap: 10 (ref 5–15)
BUN: 12 mg/dL (ref 8–23)
CO2: 28 mmol/L (ref 22–32)
Calcium: 8.6 mg/dL — ABNORMAL LOW (ref 8.9–10.3)
Chloride: 99 mmol/L (ref 98–111)
Creatinine, Ser: 1.05 mg/dL (ref 0.61–1.24)
GFR calc Af Amer: >60 mL/min (ref >60)
GFR calc non Af Amer: >60 mL/min (ref >60)
Glucose, Bld: 113 mg/dL — ABNORMAL HIGH (ref 70–99)
Potassium: 4 mmol/L (ref 3.5–5.1)
Sodium: 137 mmol/L (ref 135–145)
Total Bilirubin: 0.4 mg/dL (ref 0.3–1.2)
Total Protein: 7.8 g/dL (ref 6.5–8.1)

## 2019-11-12 MED ORDER — B COMPLEX-C PO TABS
1.0000 | ORAL_TABLET | Freq: Every day | ORAL | Status: DC
  Administered 2019-11-12 – 2019-11-17 (×6): 1 via ORAL
  Filled 2019-11-12 (×6): qty 1

## 2019-11-12 NOTE — H&P (Signed)
Physical Medicine and Rehabilitation Admission H&P     Chief Complaint  Patient presents with  . Functional declined due to debility/cellulitis/morbid obesity  HPI: Joel Dominguez is a 69 year old male with history of chronic A. Fib/mild aortic stenosis/CHF, OSA, lumbar stenosis with right foot drop, morbid obesity, chronic right 3rd toe ulcer/callus who started developing increasing edema with pain and blisters due to cellulitis and was admitted on 10/24/2019 for IV Vanc-->IV ampicillin the next day. He continued to have persistent leucocytosis with progressive cellulitis and ID recommended change po Zyvox as well as elevation to help manage edema. Bumex increased to help with fluid overload due to acute on chronic diastolic CHF. BLE dopplers negative for DVT and CT RLE negative for osteomyelitis. Dr. Mardelle Matte consulted for input on necrotizing cellulitis and patient take to OR 12/26 for I and D of bulla with dead tissue from distal medial leg, distal lateral ankle an distal medial thigh--no purulence noted. Plastics consulted for input and recommended daily dressing changes with improvement in wounds.  He has completed 14 day course of Linezolid on 01/06 and wounds healing well with resolution of leucocytosis. Hospital course significant for ABLA due to bleeding from RLE--Eliquis held briefly, orthostatic hypotension with drop in BP 80/40 treated with fluid bolus and Dr. Oval Linsey consulted for input. Bumex decreased as symptoms felt to be due to diuresis. He had few beast NSVT 1/7 am and coreg titrated upwards to 12.5 mg bid. Marland Kitchen He is showing improvement in activity tolerance with decrease in DOE and lightheadedness but noted to be debilitated. CIR recommended due to functional decline.  Pt reports LBM today- denies constipation- Uses BiPAP from home- usually wear R AFO but can't currently due to wounds on RLE- has RLE spasms, even before surgery- but feels pain meds work when takes them- didn't know had  muscle spasms meds to take prn.  Review of Systems  Constitutional: Negative for chills and fever.  HENT: Negative for hearing loss and tinnitus.  Eyes: Negative for blurred vision and double vision.  Respiratory: Negative for cough and shortness of breath.  Cardiovascular: Positive for leg swelling. Negative for chest pain and palpitations.  Gastrointestinal: Negative for constipation, heartburn and nausea.  Genitourinary: Negative for dysuria and urgency.  Musculoskeletal: Positive for back pain and myalgias.  Skin: Negative for rash.  Neurological: Positive for focal weakness (right foot drop). Negative for dizziness, weakness and headaches.  Psychiatric/Behavioral: Negative for memory loss. The patient is nervous/anxious.  All other systems reviewed and are negative.       Past Medical History:  Diagnosis Date  . A-fib 10/08/2016  . Aortic stenosis 11/06/2017  . Atherosclerotic heart disease of native coronary artery with unspecified angina pectoris ([place]) 10/08/2016  . Cellulitis 11/2019   right lower extremity  . CHF (congestive heart failure) ([place])   . Chronic anticoagulation 10/08/2016  . Foot drop- R 10/08/2016   Patient known foot drop for many years due to lumbar spinal stenosis.-Over the past couple of months symptoms have gotten worse. He feels he is tripping more and cannot lift his foot up as much. Toes are curling and calluses on end of toes. - Did see spinal surgeon many yrs ago. Declined sx at that time.   . Generalized OA 10/08/2016   Ortho doc- Dr. [facility] in [place]- both knees replaced 08, 09.   . H/O noncompliance with medical treatment, presenting hazards to health 10/22/2017  . Heart disease   . Hypertension   . Obesity, Class III,  BMI 40-49.9 (morbid obesity) ([place]) 10/08/2016  . OSA (obstructive sleep apnea) 10/08/2016   Bi-PAP nitely- been 20+ yrs now  . Pulmonary hypertension ([place]) 10/08/2016  . Spinal stenosis, lumbar region, with neurogenic claudication  10/08/2016   Back specialist in [place]- only surgeon he would use- pt declined referral here for back pain mgt   . Stroke [facility])         Past Surgical History:  Procedure Laterality Date  . I & D EXTREMITY Right 10/30/2019   Procedure: IRRIGATION AND DEBRIDEMENT EXTREMITY, right leg possible wound vac placement; Surgeon: Marchia Bond, MD; Location: [place]; Service: Orthopedics; Laterality: Right;  . REPLACEMENT TOTAL KNEE Bilateral   . TIBIA FRACTURE SURGERY          Family History  Problem Relation Age of Onset  . Congestive Heart Failure Mother   . Hypertension Mother   . Diabetes Mother   . Cancer Father    lung  . Congestive Heart Failure Sister   . Hypertension Sister   . Hypertension Brother   . Hypertension Sister   . Diabetes Brother    Social History: Retired. Moved here 6 years ago from [place]. Retired Engineer, water. He reports that he has never smoked. He has never used smokeless tobacco. He reports previous alcohol use. He reports that he does not use drugs.  Lives in 1 story home with wife in [place], [place] with 2 STE no rail- no ramp- denies every smoking, doesn't drink EtOH or use illicits.      Allergies  Allergen Reactions  . Cephalexin Rash         Medications Prior to Admission  Medication Sig Dispense Refill  . apixaban (ELIQUIS) 5 MG TABS tablet Take 1 tablet (5 mg total) by mouth 2 (two) times daily. 180 tablet 3  . bumetanide (BUMEX) 2 MG tablet TAKE 1 TABLET BY MOUTH 2 TIMES DAILY. (Patient taking differently: Take 2 mg by mouth 2 (two) times daily. ) 180 tablet 1  . carvedilol (COREG) 25 MG tablet TAKE 1 TABLET BY MOUTH 2 TIMES DAILY WITH A MEAL. (Patient taking differently: Take 25 mg by mouth 2 (two) times daily with a meal. ) 180 tablet 1  . HYDROcodone-acetaminophen (NORCO) 10-325 MG tablet Take 1 tablet by mouth as needed for moderate pain.     . Multiple Vitamin (MULTIVITAMIN) tablet Take 1 tablet by mouth daily.      Marland Kitchen spironolactone (ALDACTONE) 25 MG tablet Take 1 tablet (25 mg total) by mouth daily. 90 tablet 1  . telmisartan (MICARDIS) 80 MG tablet TAKE 1 TABLET BY MOUTH DAILY. (Patient taking differently: Take 80 mg by mouth daily. ) 90 tablet 1  . Vitamin D, Cholecalciferol, 50 MCG (2000 UT) CAPS Take 50 mcg by mouth daily.    . Vitamin D, Ergocalciferol, (DRISDOL) 1.25 MG (50000 UT) CAPS capsule TAKE 1 CAPSULE BY MOUTH EVERY 7 DAYS. (Patient taking differently: Take 50,000 Units by mouth every 7 (seven) days. ) 12 capsule 10   Drug Regimen Review  Drug regimen was reviewed and remains appropriate with no significant issues identified  Home:  Home Living  Family/patient expects to be discharged to:: Private residence  Living Arrangements: Spouse/significant other  Available Help at Discharge: Family, Available 24 hours/day  Type of Home: House  Home Access: Stairs to enter  [employer] of Steps: 3  Entrance Stairs-Rails: Left  Home Layout: One level  Bathroom Shower/Tub: Tourist information centre manager: Handicapped height  Bathroom Accessibility: Yes  Home Equipment: Cane - quad, Civil engineer, contracting, Grab bars - tub/shower  Lives With: Spouse  Functional History:  Prior Function  Level of Independence: Independent with assistive device(s)  Gait / Transfers Assistance Needed: modified independent with quad cane  ADL's / Homemaking Assistance Needed: spouse assist with LB dressing such as socks, otherwise mostly I with self care  Comments: drives  Functional Status:  Mobility:  Bed Mobility  Overal bed mobility: Needs Assistance  Bed Mobility: Supine to Sit  Rolling: Modified independent (Device/Increase time)  Supine to sit: Min assist, HOB elevated  Sit to supine: Min assist, HOB elevated  General bed mobility comments: assist to elevate trunk into sitting; use of rail  Transfers  Overall transfer level: Needs assistance  Equipment used: Rolling walker (2 wheeled)  Transfers: Sit  to/from Stand  Sit to Stand: Min assist, Min guard  Stand pivot transfers: Min guard  General transfer comment: cues for safe hand placement; assist to power up initial stand and min guard after X 2 trials from recliner  Ambulation/Gait  Ambulation/Gait assistance: Min guard(+2 for chair follow)  Gait Distance (Feet): (150 ft with 2 seated rest breaks)  Assistive device: Rolling walker (2 wheeled)  Gait Pattern/deviations: Step-through pattern, Trunk flexed, Wide base of support, Decreased step length - right, Decreased step length - left, Decreased weight shift to right, Decreased dorsiflexion - right  General Gait Details: assist to steady; chair follow due to decreased activity tolerance; pt SOB with mobility and required seated rest breaks  Gait velocity: reduced  Gait velocity interpretation: <1.31 ft/sec, indicative of household ambulator   ADL:  ADL  Overall ADL's : Needs assistance/impaired  Eating/Feeding: Independent  Grooming: Oral care, Wash/dry hands, Wash/dry face, Sitting, Supervision/safety, Min guard  Grooming Details (indicate cue type and reason): difficulty with prolonged unsupported sitting  Upper Body Bathing: Supervision/ safety, Min guard, Sitting  Lower Body Bathing: Maximal assistance, Sitting/lateral leans, Sit to/from stand  Upper Body Dressing : Supervision/safety, Min guard, Sitting  Lower Body Dressing: Maximal assistance, Sitting/lateral leans, Sit to/from stand  Toilet Transfer: Minimal assistance, Ambulation, RW  Toilet Transfer Details (indicate cue type and reason): recliner>chair 5 feet away (x2)  Toileting- Clothing Manipulation and Hygiene: Moderate assistance, Sitting/lateral lean, Sit to/from stand  Functional mobility during ADLs: Moderate assistance, Rolling walker, Cueing for safety  General ADL Comments: due to increased pain, decreased activity tolerance and strength pt requiring increased assistance for self care than baseline  Cognition:    Cognition  Overall Cognitive Status: Within Functional Limits for tasks assessed  Orientation Level: Oriented X4  Cognition  Arousal/Alertness: Awake/alert  Behavior During Therapy: WFL for tasks assessed/performed  Overall Cognitive Status: Within Functional Limits for tasks assessed  Blood pressure (!) 148/63, pulse 84, temperature 97.9 F (36.6 C), temperature source Oral, resp. rate 20, height 6' (1.829 m), weight (!) 146.6 kg, SpO2 94 %.  Physical Exam  Nursing note and vitals reviewed.  Constitutional: He is oriented to person, place, and time. He appears well-developed and well-nourished.  Pt is a morbidly obese male sitting in bedside chair, wife in room- c/o RLE spasms that are painful; legs elevated in chair; eating lunch, NAD; has beard  HENT:  Head: Normocephalic and atraumatic.  Mouth/Throat: No oropharyngeal exudate.  Face symmetrical and tongue midline  Eyes: Pupils are equal, round, and reactive to light. EOM are normal. Right eye exhibits no discharge. Left eye exhibits no discharge. No scleral icterus.  Neck: No tracheal deviation present.  Cardiovascular:  borderline tachycardia, but regular rhythm  Respiratory: No stridor. No respiratory distress. He has no wheezes.  CTA B/L- no W/R/R  GI:  Soft, protuberant, NT, ND, (+)BS  Musculoskeletal:  General: Edema present.  Cervical back: Normal range of motion and neck supple.  Comments: Well healed old B-TKR incisions.   UEs 5/5 in delt/biceps/triceps/WE/grip/finger abd  LEs- RLE_ HF 2-/5, KE 2/5, DF 0/5, PF 2-/5, EHL 0/5 LLE- 5-/5 in all same muscles tested in RLE  Neurological: He is alert and oriented to person, place, and time.  Sensation is intact in all 4 extremities to LT B/L  Skin: Skin is warm and dry.  Dry flaky skin on right shin and bilateral feet. Dry callus on R-3rd toe with underlying dried blood.  Open incision R inner distal thigh- ~ >1 cm- dried blood surrounding opening RLE lower calf and  foot/ankle- decreased redness, vaseline gauze- traces on skin; flaking skin all over- chronic venous stasis changes as well HAS backside breakdown- couldn't see in bedside chair.  Bruising all over UEs and has IV in L wrist- no signs of infiltration  Psychiatric: He has a normal mood and affect.  Appropriate affect        Lab Results Last 48 Hours        Results for orders placed or performed during the hospital encounter of 10/24/19 (from the past 48 hour(s))  CBC Status: Abnormal   Collection Time: 11/10/19 5:24 AM  Result Value Ref Range   WBC 8.9 4.0 - 10.5 K/uL   RBC 2.62 (L) 4.22 - 5.81 MIL/uL   Hemoglobin 7.7 (L) 13.0 - 17.0 g/dL   HCT 24.1 (L) 39.0 - 52.0 %   MCV 92.0 80.0 - 100.0 fL   MCH 29.4 26.0 - 34.0 pg   MCHC 32.0 30.0 - 36.0 g/dL   RDW 16.0 (H) 11.5 - 15.5 %   Platelets 497 (H) 150 - 400 K/uL   nRBC 0.0 0.0 - 0.2 %    Comment: Performed at [facility], [address], [place], [place] [address]  Comprehensive metabolic panel Status: Abnormal   Collection Time: 11/10/19 5:24 AM  Result Value Ref Range   Sodium 137 135 - 145 mmol/L   Potassium 3.8 3.5 - 5.1 mmol/L   Chloride 101 98 - 111 mmol/L   CO2 26 22 - 32 mmol/L   Glucose, Bld 116 (H) 70 - 99 mg/dL   BUN 20 8 - 23 mg/dL   Creatinine, Ser 1.15 0.61 - 1.24 mg/dL   Calcium 8.5 (L) 8.9 - 10.3 mg/dL   Total Protein 8.0 6.5 - 8.1 g/dL   Albumin 2.5 (L) 3.5 - 5.0 g/dL   AST 16 15 - 41 U/L   ALT 20 0 - 44 U/L   Alkaline Phosphatase 63 38 - 126 U/L   Total Bilirubin 0.6 0.3 - 1.2 mg/dL   GFR calc non Af Amer >60 >60 mL/min   GFR calc Af Amer >60 >60 mL/min   Anion gap 10 5 - 15    Comment: Performed at [facility] [address] [place], [place] [address]  CK Status: Abnormal   Collection Time: 11/11/19 5:10 AM  Result Value Ref Range   Total CK 26 (L) 49 - 397 U/L    Comment: Performed at [facility], [place] [address], [place], [place] [address]  CBC Status: Abnormal   Collection  Time: 11/11/19 5:10 AM  Result Value Ref Range  WBC 8.7 4.0 - 10.5 K/uL   RBC 2.73 (L) 4.22 - 5.81 MIL/uL   Hemoglobin 7.9 (L) 13.0 - 17.0 g/dL   HCT 25.7 (L) 39.0 - 52.0 %   MCV 94.1 80.0 - 100.0 fL   MCH 28.9 26.0 - 34.0 pg   MCHC 30.7 30.0 - 36.0 g/dL   RDW 16.2 (H) 11.5 - 15.5 %   Platelets 513 (H) 150 - 400 K/uL   nRBC 0.0 0.0 - 0.2 %    Comment: Performed at [facility] [address] [place], [place] [address]  Comprehensive metabolic panel Status: Abnormal   Collection Time: 11/11/19 5:10 AM  Result Value Ref Range   Sodium 137 135 - 145 mmol/L   Potassium 4.1 3.5 - 5.1 mmol/L   Chloride 100 98 - 111 mmol/L   CO2 32 22 - 32 mmol/L   Glucose, Bld 117 (H) 70 - 99 mg/dL   BUN 15 8 - 23 mg/dL   Creatinine, Ser 1.15 0.61 - 1.24 mg/dL   Calcium 8.7 (L) 8.9 - 10.3 mg/dL   Total Protein 7.8 6.5 - 8.1 g/dL   Albumin 2.6 (L) 3.5 - 5.0 g/dL   AST 16 15 - 41 U/L   ALT 18 0 - 44 U/L   Alkaline Phosphatase 64 38 - 126 U/L   Total Bilirubin 0.6 0.3 - 1.2 mg/dL   GFR calc non Af Amer >60 >60 mL/min   GFR calc Af Amer >60 >60 mL/min   Anion gap 5 5 - 15    Comment: Performed at [facility], [place] [address], [place], [place] [address]   Imaging Results (Last 48 hours)    Medical Problem List and Plan:  1. Impaired ADLs/mobility and function secondary to debility due to cellulitis of RLE/s/p I&D s/p Linezolid x 14 days; and LE weakness  -patient may shower  -ELOS/Goals: 7-10 days; Mod I  2. Antithrombotics:  -DVT/anticoagulation: Pharmaceutical: Other (comment)--Eliquis  -antiplatelet therapy: N/A  3. Pain Management: Continue oxycodone prn. Will add flexeril prn for spasms.  4. Mood: LCSW to follow for evaluation and support.  -antipsychotic agents: N/A  5. Neuropsych: This patient is capable of making decisions on his own behalf.  6. Skin/Wound Care: Air mattress overlay for DTI on sacrum. Resume soaks to callus on right 3rd toe. Add protein supplement to help  promote wound healing. Will likely need dressing changes to RLE- will discuss with previous team  7. Fluids/Electrolytes/Nutrition: Monitor I/O. Check lytes in am.  8. Necrotizing cellulitis: Leucocytosis has resolved and thrombocytosis resolving. Continue daily dressing changes; off Linezolid after 14 days  9. A fib/few beats NSVT: Monitor HR tid. On Eliquis and coreg bid- Coreg was been reduced/dose due to orthostasis. Continue to monitor for orthostatic symptoms.  10. Chronic diastolic CHF: Compensated. Bumex resumed at 1mg  on 01/06. Aldactone and telmisartan on hold--monitor weight daily and for any signs of overload. On Coreg bid.  11. OSA: Compliant with BiPAP use.  12. Morbid obesity: BMI 44--educate on importance of weight loss to help promote health and mobility.  13. ABLA: Continue to trend H/H. Will order stool guaiacs.  14. Acute renal failure: Has resolved. Will monitor to make sure no additional issues.  15. RLE foot drop- has R AFO but cannot use due to wounds on RLE- will d/w PT for gait.  16. RLE muscle spasms- do Flexeril as needed.  Bary Leriche, PA-C  11/11/2019  I have personally performed a face  to face diagnostic evaluation of this patient and formulated the key components of the plan. Additionally, I have personally reviewed laboratory data, imaging studies, as well as relevant notes and concur with the physician assistant's documentation above.  The patient's status has not changed from the original H&P. Any changes in documentation from the acute care chart have been noted above.

## 2019-11-12 NOTE — Evaluation (Signed)
Occupational Therapy Assessment and Plan  Patient Details  Name: Joel Dominguez MRN: [id number] Date of Birth: [birth year]  OT Diagnosis: muscle weakness (generalized) Rehab Potential: Rehab Potential (ACUTE ONLY): Good ELOS: ~7 days   Today's Date: 11/12/2019 OT Individual Time: 8088-1103 OT Individual Time Calculation (min): 75 min     Problem List:  Patient Active Problem List   Diagnosis Date Noted  . Physical debility 11/11/2019  . Acute blood loss anemia 11/05/2019  . Chronic congestive heart failure ([place])   . Longstanding persistent atrial fibrillation ([place])   . Necrotizing celluliitis of lower leg ([place]), right 10/24/2019  . Chronic foot ulcer ([place]) 08/08/2019  . Prediabetes 08/08/2019  . Left leg weakness 08/08/2019  . High risk medication use 08/08/2019  . Chronic ulcer of toe ([place]) 08/04/2019  . Pain due to onychomycosis of toenails of both feet 04/30/2019  . Coagulation disorder ([place]) 04/30/2019  . Chronic bilateral low back pain with right-sided sciatica 03/03/2018  . Lumbosacral radiculopathy 01/22/2018  . Osteoarthritis 11/19/2017  . Vitamin D deficiency 11/19/2017  . Positive for microalbuminuria 11/19/2017  . Hypertensive heart disease with heart failure ([place]) 11/10/2017  . 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 fascicular block) 11/07/2017  . Aortic stenosis 11/06/2017  . H/O noncompliance with medical treatment, presenting hazards to health 10/22/2017  . h/o Stroke 10/08/2016  . Atherosclerotic heart disease of native coronary artery with unspecified angina pectoris ([place]) 10/08/2016  . Sleep apnea with use of continuous positive airway pressure (CPAP) 10/08/2016  . Chronic combined systolic and diastolic heart failure ([place]) 10/08/2016  . Generalized OA 10/08/2016  . Foot drop- R 10/08/2016  . Lumbar spinal stenosis 10/08/2016  . Pulmonary hypertension ([place]) 10/08/2016  . Chronic atrial fibrillation ([place]) 10/08/2016  . Chronic anticoagulation  10/08/2016  . Obesity, Class III, BMI 40-49.9 (morbid obesity) ([place]) 10/08/2016    Past Medical History:  Past Medical History:  Diagnosis Date  . A-fib 10/08/2016  . Aortic stenosis 11/06/2017  . Atherosclerotic heart disease of native coronary artery with unspecified angina pectoris ([place]) 10/08/2016  . Cellulitis 11/2019   right lower extremity  . CHF (congestive heart failure) ([place])   . Chronic anticoagulation 10/08/2016  . Foot drop- R 10/08/2016   Patient known foot drop for many years due to lumbar spinal stenosis.-Over the past couple of months symptoms have gotten worse.  He feels he is tripping more and cannot lift his foot up as much. Toes are curling and calluses on end of toes.    - Did see spinal surgeon many yrs ago.  Declined sx at that time.     . Generalized OA 10/08/2016   Ortho doc-  Dr. [facility] in [place]- both knees replaced 08, 09.     . H/O noncompliance with medical treatment, presenting hazards to health 10/22/2017  . Heart disease   . Hypertension   . Obesity, Class III, BMI 40-49.9 (morbid obesity) ([place]) 10/08/2016  . OSA (obstructive sleep apnea) 10/08/2016   Bi-PAP nitely- been 20+ yrs now  . Pulmonary hypertension ([place]) 10/08/2016  . Spinal stenosis, lumbar region, with neurogenic claudication 10/08/2016   Back specialist in [place]-    only surgeon he would use- pt declined referral here for back pain mgt    . Stroke [facility])    Past Surgical History:  Past Surgical History:  Procedure Laterality Date  . I & D EXTREMITY Right 10/30/2019   Procedure: IRRIGATION AND DEBRIDEMENT EXTREMITY, right leg possible wound vac placement;  Surgeon:  Marchia Bond, MD;  Location: [facility];  Service: Orthopedics;  Laterality: Right;  . REPLACEMENT TOTAL KNEE Bilateral   . TIBIA FRACTURE SURGERY      Assessment & Plan Clinical Impression: Patient is a 69 y.o. year old male history of chronic A. Fib/mild aortic stenosis/CHF, OSA, lumbar stenosis with right foot drop, morbid obesity,  chronic right 3rd toe ulcer/callus who started developing increasing edema with pain and blisters due to cellulitis and was admitted on 10/24/2019 for IV Vanc-->IV ampicillin the next day. He continued to have persistent leucocytosis with progressive cellulitis and ID recommended change po Zyvox as well as elevation to help manage edema. Bumex increased to help with fluid overload due to acute on chronic diastolic CHF. BLE dopplers negative for DVT and CT RLE negative for osteomyelitis. Dr. Mardelle Matte consulted for input on necrotizing cellulitis and patient take to OR 12/26 for I and D of bulla with dead tissue from distal medial leg, distal lateral ankle an distal medial thigh--no purulence noted. Plastics consulted for input and recommended daily dressing changes with improvement in wounds.  He has completed 14 day course of Linezolid on 01/06 and wounds healing well with resolution of leucocytosis. Hospital course significant for ABLA due to bleeding from RLE--Eliquis held briefly, orthostatic hypotension with drop in BP 80/40 treated with fluid bolus and Dr. Oval Linsey consulted for input. Bumex decreased as symptoms felt to be due to diuresis. He had few beast NSVT 1/7 am and coreg titrated upwards to 12.5 mg bid. Marland Kitchen He is showing improvement in activity tolerance with decrease in DOE and lightheadedness but noted to be debilitated. CIR recommended due to functional decline.  Pt reports LBM today- denies constipation- Uses BiPAP from home- usually wear R AFO but can't currently due to wounds on RLE- has RLE spasms, even before surgery- but feels pain meds work when takes them- didn't know had muscle spasms meds to take prn.   Patient transferred to CIR on 11/11/2019 .    Patient currently requires min with basic self-care skills and basic mobility secondary to muscle weakness and decr skin intergrity , decreased cardiorespiratoy endurance and decreased sitting balance, decreased standing balance and decreased  balance strategies.  Prior to hospitalization, patient could complete ADL with modified independent .  Patient will benefit from skilled intervention to decrease level of assist with basic self-care skills and increase independence with basic self-care skills prior to discharge home with care partner.  Anticipate patient will require intermittent supervision with IADLs and follow up home health.  OT - End of Session Activity Tolerance: Tolerates 30+ min activity with multiple rests Endurance Deficit: Yes OT Assessment Rehab Potential (ACUTE ONLY): Good OT Patient demonstrates impairments in the following area(s): Balance;Edema;Endurance;Motor OT Basic ADL's Functional Problem(s): Bathing;Dressing;Toileting OT Transfers Functional Problem(s): Toilet;Tub/Shower OT Additional Impairment(s): None OT Plan OT Intensity: Minimum of 1-2 x/day, 45 to 90 minutes OT Frequency: 5 out of 7 days OT Duration/Estimated Length of Stay: ~7 days OT Treatment/Interventions: Balance/vestibular training;Self Care/advanced ADL retraining;Therapeutic Exercise;Cognitive remediation/compensation;DME/adaptive equipment instruction;Pain management;Skin care/wound managment;Community reintegration;Patient/family education;UE/LE Coordination activities;Discharge planning;Functional mobility training;Psychosocial support;Therapeutic Activities OT Self Feeding Anticipated Outcome(s): n/a OT Basic Self-Care Anticipated Outcome(s): mod I OT Toileting Anticipated Outcome(s): mod I OT Bathroom Transfers Anticipated Outcome(s): mod I OT Recommendation Patient destination: Home Follow Up Recommendations: Home health OT Equipment Recommended: Other (comment) Equipment Details: bari walker   Skilled Therapeutic Intervention Ot eval initiated with OT goals, purpose and role discussed. Pt presents with generalized weakness with functional mobility but  continues to be making gains with time. Pt able to perform bed mobility with  min A to manage right Le and for trunk support coming up into sitting. Pt reports not feeling dizzy today and feels well. Reports the dizzy did not occur at home and they have adjusted his meds. Pt ambulated with min A with bari RW to the bathroom to the toilet with min A . Pt able to complete toileting (only wearing a gown) with min A sit to stand with use of the grab bars. Pt ambulated over to the shower and sat facing outwards. PT showered only requiring A to steadying him with sit to stands and standing balance with use of the grab bar and to wash his lower legs and feet. Ambulated back out of the bathroom with RW to the recliner. Participated in dressing sit to stand with min A. Donned personal compression stocking on right LE and left right LE open to air for nursing to cover with hopes to don AFO.   Left resting in the recliner.  OT Evaluation Precautions/Restrictions  Precautions Precautions: Fall Precaution Comments: watch BP, decr skin intergrity on righ tLE Required Braces or Orthoses: Other Brace Other Brace: does have an AFO for right foot drop Restrictions Weight Bearing Restrictions: No General Chart Reviewed: Yes Family/Caregiver Present: No Vital Signs   Pain   Home Living/Prior Functioning Home Living Available Help at Discharge: Family, Available 24 hours/day Type of Home: House Home Access: Stairs to enter [employer] of Steps: 3 Entrance Stairs-Rails: Left Home Layout: One level Bathroom Shower/Tub: Multimedia programmer: Handicapped height  Lives With: Spouse ADL ADL Grooming: Independent Where Assessed-Upper Body Bathing: Retail buyer Bathing: Setup Where Assessed-Lower Body Bathing: Shower Upper Body Dressing: Moderate assistance Where Assessed-Lower Body Dressing: Chair Toileting: Minimal assistance Where Assessed-Toileting: Glass blower/designer: Psychiatric nurse Method: Information systems manager: Energy manager: Recruitment consultant: Gaffer Baseline Vision/History: No visual deficits;Wears glasses Wears Glasses: At all times Patient Visual Report: No change from baseline Perception  Perception: Within Functional Limits Praxis Praxis: Intact Cognition Overall Cognitive Status: Within Functional Limits for tasks assessed Arousal/Alertness: Awake/alert Orientation Level: Person;Place;Situation Person: Oriented Place: Oriented Situation: Oriented Year: 2021 Month: January Day of Week: Correct Memory: Appears intact Immediate Memory Recall: Sock;Blue;Bed Memory Recall Sock: Without Cue Memory Recall Blue: Without Cue Memory Recall Bed: Without Cue Awareness: Appears intact Problem Solving: Appears intact Safety/Judgment: Appears intact Sensation Sensation Light Touch: Impaired Detail Light Touch Impaired Details: Impaired RLE Proprioception: Impaired Detail Proprioception Impaired Details: Impaired RLE Coordination Gross Motor Movements are Fluid and Coordinated: No Fine Motor Movements are Fluid and Coordinated: Yes Coordination and Movement Description: right LE slight numbess - does have foot drop in PMH Motor  Motor Motor - Skilled Clinical Observations: generalized weakness Mobility  Transfers Sit to Stand: Minimal Assistance - Patient > 75% Stand to Sit: Minimal Assistance - Patient > 75%  Trunk/Postural Assessment  Cervical Assessment Cervical Assessment: Within Functional Limits Thoracic Assessment Thoracic Assessment: Within Functional Limits Lumbar Assessment Lumbar Assessment: Within Functional Limits Postural Control Postural Control: Within Functional Limits  Balance Balance Balance Assessed: Yes Dynamic Sitting Balance Dynamic Sitting - Balance Support: During functional activity Dynamic Sitting - Level of Assistance: 5: Stand by assistance Static Standing  Balance Static Standing - Balance Support: During functional activity Static Standing - Level of Assistance: 5: Stand by assistance Dynamic Standing Balance Dynamic Standing - Balance Support: During  functional activity Dynamic Standing - Level of Assistance: 4: Min assist Extremity/Trunk Assessment RUE Assessment RUE Assessment: Within Functional Limits LUE Assessment LUE Assessment: Within Functional Limits     Refer to Care Plan for Long Term Goals  Recommendations for other services: None    Discharge Criteria: Patient will be discharged from OT if patient refuses treatment 3 consecutive times without medical reason, if treatment goals not met, if there is a change in medical status, if patient makes no progress towards goals or if patient is discharged from hospital.  The above assessment, treatment plan, treatment alternatives and goals were discussed and mutually agreed upon: by patient  Nicoletta Ba 11/12/2019, 11:40 AM

## 2019-11-12 NOTE — Progress Notes (Signed)
Inpatient Rehabilitation  Patient information reviewed and entered into eRehab system by Cordera Stineman M. Tayquan Gassman, M.A., CCC/SLP, PPS Coordinator.  Information including medical coding, functional ability and quality indicators will be reviewed and updated through discharge.    

## 2019-11-12 NOTE — Progress Notes (Signed)
[facility] PHYSICAL MEDICINE & REHABILITATION PROGRESS NOTE   Subjective/Complaints:  Pt reports doing fine- slept well with own BiPAP overnight and ready for therapy this AM.     ROS- pt denies SOB, CP, abd pain, HA, vision change, N/V/D/constipation Objective:   No results found. Recent Labs    11/11/19 0510 11/12/19 0531  WBC 8.7 7.6  HGB 7.9* 8.6*  HCT 25.7* 27.0*  PLT 513* 470*   Recent Labs    11/11/19 0510 11/12/19 0531  NA 137 137  K 4.1 4.0  CL 100 99  CO2 32 28  GLUCOSE 117* 113*  BUN 15 12  CREATININE 1.15 1.05  CALCIUM 8.7* 8.6*    Intake/Output Summary (Last 24 hours) at 11/12/2019 1046 Last data filed at 11/12/2019 [id number] Gross per 24 hour  Intake 240 ml  Output --  Net 240 ml     Physical Exam: Vital Signs Blood pressure 139/82, pulse 84, temperature 97.8 F (36.6 C), resp. rate 18, SpO2 98 %.  Physical Exam  Nursing note and vitals reviewed.  Constitutional:.  Pt asleep but woke to verbal stimuli- lying supine in bed; wearing BiPAP and sleeping- said was awake earlier but napping; denies issues, NAD HENT:  Head: Normocephalic and atraumatic.  Mouth/Throat: No oropharyngeal exudate.  Eyes:  No scleral icterus.  Neck: No tracheal deviation present.  Cardiovascular:  RRR Respiratory:  CTA B/L- no W/R/R  GI:  Soft, protuberant, NT, ND, (+)BS  Musculoskeletal:  General: Edema present.  Cervical back: Normal range of motion and neck supple.  Comments: Well healed old B-TKR incisions.  UEs 5/5 in delt/biceps/triceps/WE/grip/finger abd LEs- RLE_ HF 2-/5, KE 2/5, DF 0/5, PF 2-/5, EHL 0/5 LLE- 5-/5 in all same muscles tested in RLE  Neurological: Ox3 Sensation is intact in all 4 extremities to LT B/L  Skin: Skin is warm and dry.  Dry flaky skin on right shin and bilateral feet. Dry callus on R-3rd toe with underlying dried blood.  Open incision R inner distal thigh- ~ >1 cm- dried blood surrounding opening RLE lower calf and foot/ankle-  decreased redness, vaseline gauze- traces on skin; flaking skin all over- chronic venous stasis changes as well HAS backside breakdown- unable to assess today Bruising all over UEs and has IV in L wrist- no signs of infiltration  Psychiatric: Appropriate affect     Assessment/Plan: 1. Functional deficits secondary to debility due to RLE cellulitis/weakness and s/p I&D s/p Linezolid which require 3+ hours per day of interdisciplinary therapy in a comprehensive inpatient rehab setting.  Physiatrist is providing close team supervision and 24 hour management of active medical problems listed below.  Physiatrist and rehab team continue to assess barriers to discharge/monitor patient progress toward functional and medical goals  Care Tool:  Bathing              Bathing assist       Upper Body Dressing/Undressing Upper body dressing        Upper body assist      Lower Body Dressing/Undressing Lower body dressing            Lower body assist       Toileting Toileting    Toileting assist       Transfers Chair/bed transfer  Transfers assist           Locomotion Ambulation   Ambulation assist              Walk 10 feet activity   Assist  Walk 50 feet activity   Assist           Walk 150 feet activity   Assist           Walk 10 feet on uneven surface  activity   Assist           Wheelchair     Assist               Wheelchair 50 feet with 2 turns activity    Assist            Wheelchair 150 feet activity     Assist          Blood pressure 139/82, pulse 84, temperature 97.8 F (36.6 C), resp. rate 18, SpO2 98 %.  Medical Problem List and Plan:  1. Impaired ADLs/mobility and function secondary to debility due to cellulitis of RLE/s/p I&D s/p Linezolid x 14 days; and LE weakness  -patient may shower  -ELOS/Goals: 7-10 days; Mod I  2. Antithrombotics:  -DVT/anticoagulation:  Pharmaceutical: Other (comment)--Eliquis  -antiplatelet therapy: N/A  3. Pain Management: Continue oxycodone prn. Will add flexeril prn for spasms.  4. Mood: LCSW to follow for evaluation and support.  -antipsychotic agents: N/A  5. Neuropsych: This patient is capable of making decisions on his own behalf.  6. Skin/Wound Care: Air mattress overlay for DTI on sacrum. Resume soaks to callus on right 3rd toe. Add protein supplement to help promote wound healing. Will likely need dressing changes to RLE- will discuss with previous team  7. Fluids/Electrolytes/Nutrition: Monitor I/O. Check lytes in am.  8. Necrotizing cellulitis: Leucocytosis has resolved and thrombocytosis resolving. Continue daily dressing changes; off Linezolid after 14 days   1/8- will add MVI with Vit C per Plastics 9. A fib/few beats NSVT: Monitor HR tid. On Eliquis and coreg bid- Coreg was been reduced/dose due to orthostasis. Continue to monitor for orthostatic symptoms.  10. Chronic diastolic CHF: Compensated. Bumex resumed at 1mg  on 01/06. Aldactone and telmisartan on hold--monitor weight daily and for any signs of overload. On Coreg bid.  1/8- no weights yet- will follow  11. OSA: Compliant with BiPAP use.  12. Morbid obesity: BMI 44--educate on importance of weight loss to help promote health and mobility.  13. ABLA: Continue to trend H/H. Will order stool guaiacs.  14. Acute renal failure: Has resolved. Will monitor to make sure no additional issues.   1/8- labs great 15. RLE foot drop- has R AFO but cannot use due to wounds on RLE- will d/w PT for gait.  16. RLE muscle spasms- do Flexeril as needed.   1/8- helping    LOS: 1 days A FACE TO FACE EVALUATION WAS PERFORMED  Joel Dominguez 11/12/2019, 10:46 AM

## 2019-11-12 NOTE — Progress Notes (Signed)
  Subjective: Mr Majkowski was transferred to rehab floor last night. He reports he is doing well. He has no pain in his R leg today. Reviewed recent EMR photos 11/11/19.  He is pleased with his progress.  Hgb improving. Wbc remains WNL. Objective: Vital signs in last 24 hours: Temp:  [97.8 F (36.6 C)-98.7 F (37.1 C)] 97.8 F (36.6 C) (01/08 0649) Pulse Rate:  [84-98] 84 (01/08 0649) Resp:  [18-20] 18 (01/08 0649) BP: (111-176)/(59-96) 139/82 (01/08 0655) SpO2:  [96 %-98 %] 98 % (01/08 0649) Last BM Date: 11/11/19(per H&P)  Intake/Output from previous day: No intake/output data recorded. Intake/Output this shift: No intake/output data recorded.  General appearance: alert, cooperative, no distress and resting in bed Resp: unlabored Extremities: LLE normal, 2+ DP pulse. RLE without ttp, scabbing of RLE  Incision/Wound:  RLE thigh wound improving, scabbing has flaked off at periphery. Less erythematous. Dried blood within wound. Right foot wound with scab noted, skin beginning to flake off. No erythema noted. RLE distal wounds on ankle/calf improving per EMR photos. Dry skin noted with residue of xeroform gauze. No erythema noted.            Lab Results:  CBC Latest Ref Rng & Units 11/12/2019 11/11/2019 11/10/2019  WBC 4.0 - 10.5 K/uL 7.6 8.7 8.9  Hemoglobin 13.0 - 17.0 g/dL 8.6(L) 7.9(L) 7.7(L)  Hematocrit 39.0 - 52.0 % 27.0(L) 25.7(L) 24.1(L)  Platelets 150 - 400 K/uL 470(H) 513(H) 497(H)    BMET Recent Labs    11/11/19 0510 11/12/19 0531  NA 137 137  K 4.1 4.0  CL 100 99  CO2 32 28  GLUCOSE 117* 113*  BUN 15 12  CREATININE 1.15 1.05  CALCIUM 8.7* 8.6*   PT/INR No results for input(s): LABPROT, INR in the last 72 hours. ABG No results for input(s): PHART, HCO3 in the last 72 hours.  Invalid input(s): PCO2, PO2  Studies/Results: No results found.  Anti-infectives: Anti-infectives (From admission, onward)   None      Assessment/Plan:  Continue  with xeroform gauze, plastic surgery will sign off. Will continue to follow patients progress for changes, but do not feel as if patient needs any surgical intervention at this time, his wounds are improving and progressing nicely.   May benefit from daily multivitamin with vit c.   Call with any questions or concerns.  Carola Rhine Chau [place]in, PA-C 11/12/2019

## 2019-11-12 NOTE — Discharge Instructions (Addendum)
Inpatient Rehab Discharge Instructions  Joel Dominguez Discharge date and time: 11/17/19   Activities/Precautions/ Functional Status: Activity: no lifting, driving, or strenuous exercise for till cleared by MD Diet: cardiac diet Wound Care: Cleanse area between buttocks with soap and water, then pat dry. To area intergluteal fold--place Aquacel silver into skin folds (cover linear abrasion) then cover with foam dressing. To dry flaky areas below/between thighs apply EPC/desitin.  Wash right leg with soap and water, pat dry. To wound on on right inner thigh and wound on mid posterior calf apply small square of medisorb then cover with dry dressing. Change daily. Then apply Eucerin cream to dry skin on shin and foot.  Functional status:  ___ No restrictions             ___ Walk up steps independently ___ 24/7 supervision/assistance   ___ Walk up steps with assistance _X__ Intermittent supervision/assistance  ___ Bathe/dress independently ___ Walk with walker     ___ Bathe/dress with assistance ___ Walk Independently            ___ Shower independently ___ Walk with assistance    _X__ Shower with assistance _X__ No alcohol     ___ Return to work/school ________   COMMUNITY REFERRALS UPON DISCHARGE:    Home Health:   PT     RN                         Agency:  [facility]  Phone: [phone]    Medical Equipment/Items Ordered:  Rolling walker                                                      Agency/Supplier:  [facility] @ [phone]  Special Instructions: 1. Need to have protein snack twice a day between meals.  2. Keep RLE protected from injury.  3. Keep leg elevated while in bed/chair.     My questions have been answered and I understand these instructions. I will adhere to these goals and the provided educational materials after my discharge from the hospital.  Patient/Caregiver Signature _______________________________ Date __________  Clinician Signature  _______________________________________ Date __________  Please bring this form and your medication list with you to all your follow-up doctor's appointments.     Information on my medicine - ELIQUIS (apixaban)  This medication education was reviewed with me or my healthcare representative as part of my discharge preparation.    Why was Eliquis prescribed for you? Eliquis was prescribed for you to reduce the risk of a blood clot forming that can cause a stroke if you have a medical condition called atrial fibrillation (a type of irregular heartbeat).  What do You need to know about Eliquis ? Take your Eliquis TWICE DAILY - one tablet in the morning and one tablet in the evening with or without food. If you have difficulty swallowing the tablet whole please discuss with your pharmacist how to take the medication safely.  Take Eliquis exactly as prescribed by your doctor and DO NOT stop taking Eliquis without talking to the doctor who prescribed the medication.  Stopping may increase your risk of developing a stroke.  Refill your prescription before you run out.  After discharge, you should have regular check-up appointments with your healthcare provider that is prescribing your Eliquis.  In the future your dose may need to be changed if your kidney function or weight changes by a significant amount or as you get older.  What do you do if you miss a dose? If you miss a dose, take it as soon as you remember on the same day and resume taking twice daily.  Do not take more than one dose of ELIQUIS at the same time to make up a missed dose.  Important Safety Information A possible side effect of Eliquis is bleeding. You should call your healthcare provider right away if you experience any of the following: ? Bleeding from an injury or your nose that does not stop. ? Unusual colored urine (red or dark brown) or unusual colored stools (red or black). ? Unusual bruising for unknown  reasons. ? A serious fall or if you hit your head (even if there is no bleeding).  Some medicines may interact with Eliquis and might increase your risk of bleeding or clotting while on Eliquis. To help avoid this, consult your healthcare provider or pharmacist prior to using any new prescription or non-prescription medications, including herbals, vitamins, non-steroidal anti-inflammatory drugs (NSAIDs) and supplements.  This website has more information on Eliquis (apixaban): http://[email or web address].com/eliquis/home

## 2019-11-12 NOTE — Evaluation (Signed)
Physical Therapy Assessment and Plan  Patient Details  Name: Joel Dominguez MRN: [id number] Date of Birth: [birth year]  PT Diagnosis: Abnormality of gait, Difficulty walking, Edema, Impaired sensation, Muscle weakness and Pain in joint Rehab Potential: Excellent ELOS: 5-7 days   Today's Date: 11/12/2019 PT Individual Time: 1000-1118 PT Individual Time Calculation (min): 78 min    Problem List:  Patient Active Problem List   Diagnosis Date Noted  . Physical debility 11/11/2019  . Acute blood loss anemia 11/05/2019  . Chronic congestive heart failure ([place])   . Longstanding persistent atrial fibrillation ([place])   . 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 Chronic foot ulcer ([place]) 08/08/2019  . Prediabetes 08/08/2019  . Left leg weakness 08/08/2019  . High risk medication use 08/08/2019  . Chronic ulcer of toe ([place]) 08/04/2019  . Pain due to onychomycosis of toenails of both feet 04/30/2019  . Coagulation disorder ([place]) 04/30/2019  . Chronic bilateral low back pain with right-sided sciatica 03/03/2018  . Lumbosacral radiculopathy 01/22/2018  . Osteoarthritis 11/19/2017  . Vitamin D deficiency 11/19/2017  . Positive for microalbuminuria 11/19/2017  . Hypertensive heart disease with heart failure ([place]) 11/10/2017  . 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 fascicular block) 11/07/2017  . Aortic stenosis 11/06/2017  . H/O noncompliance with medical treatment, presenting hazards to health 10/22/2017  . h/o Stroke 10/08/2016  . Atherosclerotic heart disease of native coronary artery with unspecified angina pectoris ([place]) 10/08/2016  . Sleep apnea with use of continuous positive airway pressure (CPAP) 10/08/2016  . Chronic combined systolic and diastolic heart failure ([place]) 10/08/2016  . Generalized OA 10/08/2016  . Foot drop- R 10/08/2016  . Lumbar spinal stenosis 10/08/2016  . Pulmonary hypertension ([place]) 10/08/2016  . Chronic atrial fibrillation ([place])  10/08/2016  . Chronic anticoagulation 10/08/2016  . Obesity, Class III, BMI 40-49.9 (morbid obesity) ([place]) 10/08/2016    Past Medical History:  Past Medical History:  Diagnosis Date  . A-fib 10/08/2016  . Aortic stenosis 11/06/2017  . Atherosclerotic heart disease of native coronary artery with unspecified angina pectoris ([place]) 10/08/2016  . Cellulitis 11/2019   right lower extremity  . CHF (congestive heart failure) ([place])   . Chronic anticoagulation 10/08/2016  . Foot drop- R 10/08/2016   Patient known foot drop for many years due to lumbar spinal stenosis.-Over the past couple of months symptoms have gotten worse.  He feels he is tripping more and cannot lift his foot up as much. Toes are curling and calluses on end of toes.    - Did see spinal surgeon many yrs ago.  Declined sx at that time.     . Generalized OA 10/08/2016   Ortho doc-  Dr. [facility] in [place]- both knees replaced 08, 09.     . H/O noncompliance with medical treatment, presenting hazards to health 10/22/2017  . Heart disease   . Hypertension   . Obesity, Class III, BMI 40-49.9 (morbid obesity) ([place]) 10/08/2016  . OSA (obstructive sleep apnea) 10/08/2016   Bi-PAP nitely- been 20+ yrs now  . Pulmonary hypertension ([place]) 10/08/2016  . Spinal stenosis, lumbar region, with neurogenic claudication 10/08/2016   Back specialist in [place]-    only surgeon he would use- pt declined referral here for back pain mgt    . Stroke [facility])    Past Surgical History:  Past Surgical History:  Procedure Laterality Date  . I & D EXTREMITY Right 10/30/2019   Procedure: IRRIGATION AND DEBRIDEMENT EXTREMITY, right leg  possible wound vac placement;  Surgeon: Marchia Bond, MD;  Location: [facility];  Service: Orthopedics;  Laterality: Right;  . REPLACEMENT TOTAL KNEE Bilateral   . TIBIA FRACTURE SURGERY      Assessment & Plan Clinical Impression:  Clarion Mooneyhan is a 69 year old male with history of chronic A. Fib/mild aortic stenosis/CHF, OSA, lumbar  stenosis with right foot drop, morbid obesity, chronic right 3rd toe ulcer/callus who started developing increasing edema with pain and blisters due to cellulitis and was admitted on 10/24/2019 for IV Vanc-->IV ampicillin the next day. He continued to have persistent leucocytosis with progressive cellulitis and ID recommended change po Zyvox as well as elevation to help manage edema. Bumex increased to help with fluid overload due to acute on chronic diastolic CHF. BLE dopplers negative for DVT and CT RLE negative for osteomyelitis. Dr. Mardelle Matte consulted for input on necrotizing cellulitis and patient take to OR 12/26 for I and D of bulla with dead tissue from distal medial leg, distal lateral ankle an distal medial thigh--no purulence noted. Plastics consulted for input and recommended daily dressing changes with improvement in wounds.  He has completed 14 day course of Linezolid on 01/06 and wounds healing well with resolution of leucocytosis. Hospital course significant for ABLA due to bleeding from RLE--Eliquis held briefly, orthostatic hypotension with drop in BP 80/40 treated with fluid bolus and Dr. Oval Linsey consulted for input. Bumex decreased as symptoms felt to be due to diuresis. He had few beast NSVT 1/7 am and coreg titrated upwards to 12.5 mg bid. Marland Kitchen He is showing improvement in activity tolerance with decrease in DOE and lightheadedness but noted to be debilitated. CIR recommended due to functional decline.  Patient transferred to CIR on 11/11/2019 .   Patient currently requires min with mobility secondary to muscle weakness, decreased cardiorespiratoy endurance and decreased standing balance and decreased balance strategies.  Prior to hospitalization, patient was modified independent  with mobility and lived with Spouse in a House home.  Home access is 4 STE from front w/ bilateral rails.Stairs to enter.  Patient will benefit from skilled PT intervention to maximize safe functional mobility for  planned discharge home with 24 hour supervision.  Anticipate patient will benefit from follow up [place] at discharge.  PT - End of Session Endurance Deficit: Yes Endurance Deficit Description: Resident able to participate in therapy treatment w/ occasional seatd rest breaks. PT Assessment Rehab Potential (ACUTE/IP ONLY): Excellent PT Patient demonstrates impairments in the following area(s): Balance;Endurance;Motor;Skin Integrity;Safety;Edema;Pain PT Transfers Functional Problem(s): Bed to Chair;Car;Bed Mobility;Furniture PT Locomotion Functional Problem(s): Ambulation;Stairs PT Plan PT Intensity: Minimum of 1-2 x/day ,45 to 90 minutes PT Frequency: 5 out of 7 days PT Duration Estimated Length of Stay: 5-7 days PT Treatment/Interventions: Ambulation/gait training;Stair training;UE/LE Strength taining/ROM;Therapeutic Activities;Patient/family education;Therapeutic Exercise;DME/adaptive equipment instruction;Neuromuscular re-education;Psychosocial support;Discharge planning;Balance/vestibular training;Pain management;Skin care/wound management;Functional mobility training;Splinting/orthotics PT Transfers Anticipated Outcome(s): Mod I overall, ambulatory PT Locomotion Anticipated Outcome(s): Mod I household gait , stairs supervision. PT Recommendation Follow Up Recommendations: Home health PT Patient destination: Home Equipment Recommended: Rolling walker with 5" wheels  Skilled Therapeutic Intervention Evaluation completed (see details above and below) with education on PT POC and goals and individual treatment initiated with focus on gait and balance for D/C to home.   Patient presents seated in recliner chair w/ LEs elevated, waiting for nursing to perform dressing change.  Slipper sock donned and attempted don of right shoe w/ AFO, but unable to complete d/t edema and dressing.  Post-op shoe applied to  participate w/ mobility.  Patient required min A for multiple sit to stand transfers from all  surfaces, recliner, mat table and w/c.  Patient requires verbal cues for initiation including scooting forward and forward lean.  Patient amb multiple short distance w/ RW in room/confined space, including to BR changing surface w/ min A.  Patient amb multiple trials w/ RW and min A w/ noted difficulty advancing right LE.  Patient encouraged to perform sequence of RW, right LE and then left LE.  Patient able to amb up to 50' before requiring seated rest break d/t fatigue.  Patient performed sup <> sit transfers on mat table w/ CGA and verbal cues or right LE.  Patient performed SPT mat > w/c using RW and min A, verbal cues for posture and foot clearance.  Patient negotiated 4 steps w/ bilateral rails and min A.  Patient cued on proper sequencing to decrease assist.  Patient performed standing toe taps to 1 3/4" platform w/ emphasis on right hip/knee flex to clear toes.  Patient required multiple seated rest breaks throughout session d/t fatigue, given cold wash cloth for perspiration.  Patient negotiated w/c w/ CGA  40' through doorway and in hallway using bilateral UEs and left LE.  Patient returned to recliner w/ LEs elevated and pillow under right LE.  All needs within reach.  PT Evaluation Precautions/Restrictions Precautions Precautions: Fall Precaution Comments: watch BP, decr skin intergrity on righ tLE Required Braces or Orthoses: Other Brace Other Brace: Has right AFO PTA, unable to don today d/t edema/dessing.  Post-op shoe available for use Restrictions Weight Bearing Restrictions: No General Chart Reviewed: Yes Vital Signs  Pain:  Patient states minimal pain to right lower leg approx. 3/10 w/ activity.  Patient will ask nurse for pain meds after rx.   Home Living/Prior Functioning Home Living Available Help at Discharge: Available 24 hours/day Type of Home: House Home Access: Stairs to enter [employer] of Steps: 4 STE from front w/ bilateral rails. Entrance Stairs-Rails:  Can reach both Home Layout: One level Bathroom Shower/Tub: Multimedia programmer: Handicapped height Bathroom Accessibility: Yes  Lives With: Spouse Prior Function Level of Independence: Independent with gait;Requires assistive device for independence  Able to Take Stairs?: Yes Driving: Yes Vocation: Volunteer work Leisure: Hobbies-yes (Comment) Comments: repairs things for friends, acts as [place]. Vision/Perception  Perception Perception: Within Functional Limits Praxis Praxis: Intact  Cognition Overall Cognitive Status: Within Functional Limits for tasks assessed Arousal/Alertness: Awake/alert Orientation Level: Oriented X4 Memory: Appears intact Immediate Memory Recall: Sock;Blue;Bed Memory Recall Sock: Without Cue Memory Recall Blue: Without Cue Memory Recall Bed: Without Cue Awareness: Appears intact Problem Solving: Appears intact Safety/Judgment: Appears intact Sensation Sensation Light Touch: Impaired Detail Light Touch Impaired Details: Impaired RLE Proprioception: Impaired Detail Proprioception Impaired Details: Impaired RLE Coordination Gross Motor Movements are Fluid and Coordinated: No Fine Motor Movements are Fluid and Coordinated: Yes Coordination and Movement Description: Right foot drop, circumduction for right LE advancement Motor  Motor Motor - Skilled Clinical Observations: Right LE weakness generally d/t recent hospital and medical care.  Mobility Bed Mobility Bed Mobility: Supine to Sit;Sit to Supine Supine to Sit: Contact Guard/Touching assist Sit to Supine: Contact Guard/Touching assist Transfers Transfers: Sit to Stand;Stand Pivot Transfers Sit to Stand: Minimal Assistance - Patient > 75% Stand to Sit: Minimal Assistance - Patient > 75% Stand Pivot Transfers: Minimal Assistance - Patient > 75% Stand Pivot Transfer Details: Verbal cues for technique Stand Pivot Transfer Details (indicate cue type and reason):  Use of RW and right  foot clearance. Transfer (Assistive device): Manufacturing systems engineer Ambulation: Yes Gait Assistance: Minimal Assistance - Patient > 75% Gait Distance (Feet): 50 Feet Assistive device: Rolling walker Gait Assistance Details: Verbal cues for gait pattern;Verbal cues for sequencing Gait Assistance Details: verbal cues for sequencing to improve right foot clearance w/ step-through. Gait Gait: Yes Gait Pattern: Decreased hip/knee flexion - right;Right circumduction Gait velocity: reduced Stairs / Additional Locomotion Stairs: Yes Stairs Assistance: Minimal Assistance - Patient > 75% Stair Management Technique: Forwards;Two rails Number of Stairs: 4 Height of Stairs: 6 Wheelchair Mobility Wheelchair Mobility: Yes Wheelchair Assistance: Development worker, international aid: Both upper extremities;Left lower extremity Wheelchair Parts Management: Needs assistance Distance: 40  Trunk/Postural Assessment  Cervical Assessment Cervical Assessment: Within Functional Limits Thoracic Assessment Thoracic Assessment: Within Functional Limits Lumbar Assessment Lumbar Assessment: Within Functional Limits Postural Control Postural Control: Within Functional Limits  Balance Balance Balance Assessed: Yes Dynamic Sitting Balance Dynamic Sitting - Balance Support: During functional activity Dynamic Sitting - Level of Assistance: 5: Stand by assistance Sitting balance - Comments: steady sitting EOB Static Standing Balance Static Standing - Balance Support: During functional activity Static Standing - Level of Assistance: 5: Stand by assistance Static Standing - Comment/# of Minutes: 2-3 Dynamic Standing Balance Dynamic Standing - Balance Support: During functional activity Dynamic Standing - Level of Assistance: 4: Min assist Dynamic Standing - Balance Activities: Other (comment) Dynamic Standing - Comments: toe-taps 1 1/4 inch platform Extremity Assessment  RUE  Assessment RUE Assessment: Within Functional Limits LUE Assessment LUE Assessment: Within Functional Limits RLE Assessment RLE Assessment: Exceptions to [facility] General Strength Comments: right hip strength grossly 2-/5, right knee flex/ext 2+/5 LLE Assessment LLE Assessment: Within Functional Limits    Refer to Care Plan for Long Term Goals  Recommendations for other services: None   Discharge Criteria: Patient will be discharged from PT if patient refuses treatment 3 consecutive times without medical reason, if treatment goals not met, if there is a change in medical status, if patient makes no progress towards goals or if patient is discharged from hospital.  The above assessment, treatment plan, treatment alternatives and goals were discussed and mutually agreed upon: by patient  Ladoris Gene 11/12/2019, 12:16 PM

## 2019-11-12 NOTE — Progress Notes (Signed)
Physical Therapy Session Note  Patient Details  Name: Joel Dominguez MRN: [id number] Date of Birth: 1951/03/04  Today's Date: 11/12/2019 PT Individual Time: 0135-0236 PT Individual Time Calculation (min): 61 min   Short Term Goals: Week 1:  PT Short Term Goal 1 (Week 1): equals LTG d/t LOS  Skilled Therapeutic Interventions/Progress Updates: Patient presents seated in recliner chair.  Post-op shoe applied to right LE.  Patient transfers sit to stand w/ min A and verbal cues for initiation especially for forward lean.  Patient amb multiple trials w/ RW and min A up to 60'.  Patient amb w/ improved advancement of right LE w/ toe clearance.  Patient requires seated rest breaks between trials.  Patient amb to car and required min to mod A for bilateral LEs in and out of car.  Car elevated to correct height (patient states [facility]).  Patient performed SPT w/ RW w/c <> Nu-step, mod A for placing feet on foot pads.  Patient performed 2 x 7' trials at level 5 to increase RLE strength, using bilateral LEs and left UE.  Patient performed w/c mobility w/ bilateral UEs x 60' and occasional CGA to avoid right wall.  Patient returned to room and sat up in recliner chair w/ LEs elevated.  Patient very encouraged w/ activity level and states LEs moving much better since now able to get OOB.  Ambulation/gait training;Stair training;UE/LE Strength taining/ROM;Therapeutic Activities;Patient/family education;Therapeutic Exercise;DME/adaptive equipment instruction;Neuromuscular re-education;Psychosocial support;Discharge planning;Balance/vestibular training;Pain management;Skin care/wound management;Functional mobility training;Splinting/orthotics   Therapy Documentation Precautions:  Precautions Precautions: Fall Precaution Comments: watch BP, decr skin intergrity on righ tLE Required Braces or Orthoses: Other Brace Other Brace: Has right AFO PTA, unable to don today d/t edema/dessing.  Post-op shoe  available for use Restrictions Weight Bearing Restrictions: No General: Chart Reviewed: Yes Vital Signs: Therapy Vitals Temp: 98.1 F (36.7 C) Pulse Rate: 92 Resp: 18 BP: (!) 159/91 Patient Position (if appropriate): Sitting Oxygen Therapy SpO2: 95 % O2 Device: Room Air Pain: Patient has no c/o pain.   Mobility: Bed Mobility Bed Mobility: Supine to Sit;Sit to Supine Supine to Sit: Contact Guard/Touching assist Sit to Supine: Contact Guard/Touching assist Transfers Transfers: Sit to Stand;Stand Pivot Transfers Sit to Stand: Minimal Assistance - Patient > 75% Stand to Sit: Minimal Assistance - Patient > 75% Stand Pivot Transfers: Minimal Assistance - Patient > 75% Stand Pivot Transfer Details: Verbal cues for technique Stand Pivot Transfer Details (indicate cue type and reason): Use of RW and right foot clearance. Transfer (Assistive device): Rolling walker Locomotion : Gait Ambulation: Yes Gait Assistance: Minimal Assistance - Patient > 75% Gait Distance (Feet): 50 Feet Assistive device: Rolling walker Gait Assistance Details: Verbal cues for gait pattern;Verbal cues for sequencing Gait Assistance Details: verbal cues for sequencing to improve right foot clearance w/ step-through. Gait Gait: Yes Gait Pattern: Decreased hip/knee flexion - right;Right circumduction Gait velocity: reduced Stairs / Additional Locomotion Stairs: Yes Stairs Assistance: Minimal Assistance - Patient > 75% Stair Management Technique: Forwards;Two rails Number of Stairs: 4 Height of Stairs: 6 Wheelchair Mobility Wheelchair Mobility: Yes Wheelchair Assistance: Development worker, international aid: Both upper extremities;Left lower extremity Wheelchair Parts Management: Needs assistance Distance: 40  Trunk/Postural Assessment : Cervical Assessment Cervical Assessment: Within Functional Limits Thoracic Assessment Thoracic Assessment: Within Functional Limits Lumbar  Assessment Lumbar Assessment: Within Functional Limits Postural Control Postural Control: Within Functional Limits  Balance: Dynamic Sitting Balance Sitting balance - Comments: steady sitting EOB Static Standing Balance Static Standing - Balance Support: During  functional activity Static Standing - Level of Assistance: 5: Stand by assistance Static Standing - Comment/# of Minutes: 2-3 Dynamic Standing Balance Dynamic Standing - Balance Support: During functional activity Dynamic Standing - Level of Assistance: 4: Min assist Dynamic Standing - Balance Activities: Other (comment) Dynamic Standing - Comments: toe-taps 1 1/4 inch platform s:      Therapy/Group: Individual Therapy  Ladoris Gene 11/12/2019, 3:27 PM

## 2019-11-13 ENCOUNTER — Inpatient Hospital Stay (HOSPITAL_COMMUNITY): Payer: PRIVATE HEALTH INSURANCE | Admitting: Occupational Therapy

## 2019-11-13 ENCOUNTER — Inpatient Hospital Stay (HOSPITAL_COMMUNITY): Payer: PRIVATE HEALTH INSURANCE | Admitting: Physical Therapy

## 2019-11-13 NOTE — Progress Notes (Signed)
Physical Therapy Session Note  Patient Details  Name: Joel Dominguez MRN: [id number] Date of Birth: Dec 02, 1950  Today's Date: 11/13/2019 PT Individual Time: [id number] PT Individual Time Calculation (min): 57 min   Short Term Goals: Week 1:  PT Short Term Goal 1 (Week 1): equals LTG d/t LOS  Skilled Therapeutic Interventions/Progress Updates:  Pt was seen bedside in the am. Pt performed all sit to stand and stand pivot transfers with c/g and verbal cues. Pt ambulated distances 30, 60, and 40 feet with rolling walker and c/g with verbal cues. As pt fatigues increased foot drop noted on R LE during swing phase. In gym treatment focused on LE strengthening. Pt performed step taps and mini squats 3 sets x 10 reps each. Pt returned to room following treatment. Pt transferred w/c to recliner with c/g and rolling walker. Pt left sitting up in recliner with all needs within reach.   Therapy Documentation Precautions:  Precautions Precautions: Fall Precaution Comments: watch BP, decr skin intergrity on righ tLE Required Braces or Orthoses: Other Brace Other Brace: Has right AFO PTA, unable to don today d/t edema/dessing.  Post-op shoe available for use Restrictions Weight Bearing Restrictions: No General:   Pain: No c/o pain, medicated prior to treatment.   Therapy/Group: Individual Therapy  [name] 11/13/2019, 12:43 PM

## 2019-11-13 NOTE — Progress Notes (Addendum)
Occupational Therapy Session Note  Patient Details  Name: Joel Dominguez MRN: [id number] Date of Birth: Jul 21, 1951  Today's Date: 11/13/2019 OT Individual Time: 0820-0920 OT Individual Time Calculation (min): 60 min    Short Term Goals: Week 1:  OT Short Term Goal 1 (Week 1): LTG =STG  Skilled Therapeutic Interventions/Progress Updates: Patient in bed and agreed to wash sitting and standing at sink.  Voiced he did not want to shower today.  Air was released from mattress before patient attempted to transfer to edge of bed.  He completed head of bed elevated to edge of bed transfer= minimal verbal ues for techniques where to place his hands for leverage.  patietn with decrease core strength, requiring extra time to mobiliize himself from on bed to edge of bed.   Sit to stand at edge of bed= extra time and close S Edge of bed to walk to sink with RW=close S  UB bathing and dressing =setup LB bathing=sat to wash periarea and buttocks in his chair and elected not to wash either lower leg or foot LB dressing= extra time to reach forward to feet to don underwear and shorts of both feet and close S for sit to stand and standig  Patietn short of breath and stated he was fine and that he was simply a mouth breather.   He was educated on energy conservation techniques during this session and was encouraged to take rest breaks as he appeared fatigued as evidenced by sighing, heavy breathing at times through his mouth and slow to complete lower body aspects.of bathing and dressing  Standing balance dynamically for ADLS=good+  Patient asssisted to recliner and helped to prop up right leg onto pillow.  His call bell and phones were within reach.  Continue OT Plan of Care     Therapy Documentation Precautions:   Precautions Precautions: Fall Precaution Comments: watch BP, decr skin intergrity on righ tLE Required Braces or Orthoses: Other Brace Other Brace: Has right AFO PTA,.  Post-op shoe  available for use Restrictions Weight Bearing Restrictions: No \    Pain:denied   ADL:   Therapy/Group: Individual Therapy  [facility] 11/13/2019, 9:44 AM

## 2019-11-13 NOTE — Progress Notes (Signed)
Pt able to place himself on his home CPAP without difficulty. RT will continue to monitor.

## 2019-11-13 NOTE — Progress Notes (Signed)
[place] PHYSICAL MEDICINE & REHABILITATION PROGRESS NOTE   Subjective/Complaints:   No issues overnite Bowels and bladder ok, appetite good   ROS- pt denies SOB, CP, abd pain, HA, vision change, N/V/D/constipation Objective:   No results found. Recent Labs    11/11/19 0510 11/12/19 0531  WBC 8.7 7.6  HGB 7.9* 8.6*  HCT 25.7* 27.0*  PLT 513* 470*   Recent Labs    11/11/19 0510 11/12/19 0531  NA 137 137  K 4.1 4.0  CL 100 99  CO2 32 28  GLUCOSE 117* 113*  BUN 15 12  CREATININE 1.15 1.05  CALCIUM 8.7* 8.6*    Intake/Output Summary (Last 24 hours) at 11/13/2019 0757 Last data filed at 11/13/2019 0553 Gross per 24 hour  Intake 720 ml  Output 275 ml  Net 445 ml     Physical Exam: Vital Signs Blood pressure (!) 142/69, pulse 82, temperature 98.1 F (36.7 C), resp. rate 16, SpO2 97 %.  Physical Exam  Nursing note and vitals reviewed.  Constitutional:.  Pt asleep but woke to verbal stimuli- lying supine in bed; wearing BiPAP and sleeping- said was awake earlier but napping; denies issues, NAD HENT:  Head: Normocephalic and atraumatic.  Mouth/Throat: No oropharyngeal exudate.  Eyes:  No scleral icterus.  Neck: No tracheal deviation present.  Cardiovascular:  RRR Respiratory:  CTA B/L- no W/R/R  GI:  Soft, protuberant, NT, ND, (+)BS  Musculoskeletal:  General: Edema present.  Cervical back: Normal range of motion and neck supple.  Comments: Well healed old B-TKR incisions.  UEs 5/5 in delt/biceps/triceps/WE/grip/finger abd LEs- RLE_ HF 2-/5, KE 2/5, DF 0/5, PF 2-/5, EHL 0/5 LLE- 5-/5 in all same muscles tested in RLE  Neurological: Ox3 Sensation is intact in all 4 extremities to LT B/L  Skin: Skin is warm and dry.  Dry flaky skin on right shin and bilateral feet. Dry callus on R-3rd toe with underlying dried blood.  Open incision R inner distal thigh- ~ >1 cm- dried blood surrounding opening RLE lower calf and foot/ankle- decreased redness, vaseline  gauze- traces on skin; flaking skin all over- chronic venous stasis changes as well HAS backside breakdown- unable to assess today Bruising all over UEs and has IV in L wrist- no signs of infiltration  Psychiatric: Appropriate affect     Assessment/Plan: 1. Functional deficits secondary to debility due to RLE cellulitis/weakness and s/p I&D s/p Linezolid which require 3+ hours per day of interdisciplinary therapy in a comprehensive inpatient rehab setting.  Physiatrist is providing close team supervision and 24 hour management of active medical problems listed below.  Physiatrist and rehab team continue to assess barriers to discharge/monitor patient progress toward functional and medical goals  Care Tool:  Bathing    Body parts bathed by patient: Right arm, Left arm, Chest, Abdomen, Front perineal area, Buttocks, Right upper leg, Left upper leg, Face   Body parts bathed by helper: Right lower leg, Left lower leg     Bathing assist Assist Level: Minimal Assistance - Patient > 75%     Upper Body Dressing/Undressing Upper body dressing   What is the patient wearing?: Pull over shirt    Upper body assist Assist Level: Set up assist    Lower Body Dressing/Undressing Lower body dressing      What is the patient wearing?: Underwear/pull up, Pants     Lower body assist Assist for lower body dressing: Minimal Assistance - Patient > 75%     Toileting Toileting  Toileting assist Assist for toileting: Minimal Assistance - Patient > 75%     Transfers Chair/bed transfer  Transfers assist     Chair/bed transfer assist level: Minimal Assistance - Patient > 75%     Locomotion Ambulation   Ambulation assist      Assist level: Minimal Assistance - Patient > 75% Assistive device: Walker-rolling Max distance: 60'   Walk 10 feet activity   Assist     Assist level: Minimal Assistance - Patient > 75% Assistive device: Walker-rolling   Walk 50 feet  activity   Assist    Assist level: Minimal Assistance - Patient > 75% Assistive device: Walker-rolling    Walk 150 feet activity   Assist Walk 150 feet activity did not occur: Refused         Walk 10 feet on uneven surface  activity   Assist Walk 10 feet on uneven surfaces activity did not occur: Safety/medical concerns         Wheelchair     Assist Will patient use wheelchair at discharge?: No Type of Wheelchair: Manual    Wheelchair assist level: Contact Guard/Touching assist Max wheelchair distance: 46'    Wheelchair 50 feet with 2 turns activity    Assist    Wheelchair 50 feet with 2 turns activity did not occur: Safety/medical concerns   Assist Level: Contact Guard/Touching assist   Wheelchair 150 feet activity     Assist          Blood pressure (!) 142/69, pulse 82, temperature 98.1 F (36.7 C), resp. rate 16, SpO2 97 %.  Medical Problem List and Plan:  1. Impaired ADLs/mobility and function secondary to debility due to cellulitis of RLE/s/p I&D s/p Linezolid x 14 days; and LE weakness  -patient may shower  -ELOS/Goals: 7-10 days; Mod I  2. Antithrombotics:  -DVT/anticoagulation: Pharmaceutical: Other (comment)--Eliquis  -antiplatelet therapy: N/A  3. Pain Management: Continue oxycodone prn. Will add flexeril prn for spasms.  4. Mood: LCSW to follow for evaluation and support.  -antipsychotic agents: N/A  5. Neuropsych: This patient is capable of making decisions on his own behalf.  6. Skin/Wound Care: Air mattress overlay for DTI on sacrum. Resume soaks to callus on right 3rd toe. Add protein supplement to help promote wound healing. Will likely need dressing changes to RLE- will discuss with previous team  7. Fluids/Electrolytes/Nutrition: Monitor I/O. Check lytes in am.  8. Necrotizing cellulitis: Leucocytosis has resolved and thrombocytosis resolving. Continue daily dressing changes; off Linezolid after 14 days   1/8- will add  MVI with Vit C per Plastics 9. A fib/few beats NSVT: Monitor HR tid. On Eliquis and coreg bid- Coreg was been reduced/dose due to orthostasis. Continue to monitor for orthostatic symptoms.  10. Chronic diastolic CHF: Compensated. Bumex resumed at 1mg  on 01/06. Aldactone and telmisartan on hold--monitor weight daily and for any signs of overload. On Coreg bid.  1/8- no weights yet- will follow  11. OSA: Compliant with BiPAP use.  12. Morbid obesity: BMI 44--educate on importance of weight loss to help promote health and mobility.  13. ABLA: Continue to trend H/H. Will order stool guaiacs.  14. Acute renal failure: Has resolved. Will monitor to make sure no additional issues.   1/8- labs great 15. RLE foot drop- has R AFO but cannot use due to wounds on RLE- will d/w PT for gait.  16. RLE muscle spasms- do Flexeril as needed.   1/8- helping    LOS: 2 days A FACE  TO FACE EVALUATION WAS PERFORMED  Charlett Blake 11/13/2019, 7:57 AM

## 2019-11-13 NOTE — IPOC Note (Signed)
Overall Plan of Care [facility]) Patient Details Name: Joel Dominguez MRN: [id number] DOB: [birth year]  Admitting Diagnosis: Physical debility  Hospital Problems: Principal Problem:   Physical debility Active Problems:   Sleep apnea with use of continuous positive airway pressure (CPAP)   Chronic combined systolic and diastolic heart failure (HCC)   Foot drop- R   Pulmonary hypertension (HCC)   Chronic atrial fibrillation (HCC)   Chronic anticoagulation   Obesity, Class III, BMI 40-49.9 (morbid obesity) (HCC)   Lumbosacral radiculopathy   Necrotizing celluliitis of lower leg ([place]), right     Functional Problem List: Nursing Pain, Medication Management, Motor, Endurance, Skin Integrity  PT Balance, Endurance, Motor, Skin Integrity, Safety, Edema, Pain  OT Balance, Edema, Endurance, Motor  SLP    TR         Basic ADL's: OT Bathing, Dressing, Toileting     Advanced  ADL's: OT       Transfers: PT Bed to Chair, Car, Bed Mobility, Manufacturing systems engineer, Metallurgist: PT Ambulation, Stairs     Additional Impairments: OT None  SLP        TR      Anticipated Outcomes Item Anticipated Outcome  Self Feeding n/a  Swallowing      Basic self-care  mod I  Toileting  mod I   Bathroom Transfers mod I  Bowel/Bladder  Pt will manage bowel and bladder with min assist  Transfers  Mod I overall, ambulatory  Locomotion  Mod I household gait , stairs supervision.  Communication     Cognition     Pain  Pt will manage pain at 3 or less on a scale of 0-10.  Safety/Judgment  Pt will remain free of falls with injury while in rehab with min assist   Therapy Plan: PT Intensity: Minimum of 1-2 x/day ,45 to 90 minutes PT Frequency: 5 out of 7 days PT Duration Estimated Length of Stay: 5-7 days OT Intensity: Minimum of 1-2 x/day, 45 to 90 minutes OT Frequency: 5 out of 7 days OT Duration/Estimated Length of Stay: ~7 days     Due to the current state of  emergency, patients may not be receiving their 3-hours of Medicare-mandated therapy.   Team Interventions: Nursing Interventions Patient/Family Education, Disease Management/Prevention, Medication Management, Pain Management, Discharge Planning  PT interventions Ambulation/gait training, Stair training, UE/LE Strength taining/ROM, Therapeutic Activities, Patient/family education, Therapeutic Exercise, DME/adaptive equipment instruction, Neuromuscular re-education, Psychosocial support, Discharge planning, Balance/vestibular training, Pain management, Skin care/wound management, Functional mobility training, Splinting/orthotics  OT Interventions Balance/vestibular training, Self Care/advanced ADL retraining, Therapeutic Exercise, Cognitive remediation/compensation, DME/adaptive equipment instruction, Pain management, Skin care/wound managment, Community reintegration, Patient/family education, UE/LE Coordination activities, Discharge planning, Functional mobility training, Psychosocial support, Therapeutic Activities  SLP Interventions    TR Interventions    SW/CM Interventions Discharge Planning, Psychosocial Support, Patient/Family Education   Barriers to Discharge MD  Medical stability  Nursing      PT      OT      SLP      SW       Team Discharge Planning: Destination: PT-Home ,OT- Home , SLP-  Projected Follow-up: PT-Home health PT, OT-  Home health OT, SLP-  Projected Equipment Needs: PT-Rolling walker with 5" wheels, OT- Other (comment), SLP-  Equipment Details: PT- , OT-bari walker Patient/family involved in discharge planning: PT- Patient,  OT-Patient, SLP-   MD ELOS: 7d Medical Rehab Prognosis:  Good Assessment:  69 year old male with history of  chronic A. Fib/mild aortic stenosis/CHF, OSA, lumbar stenosis with right foot drop, morbid obesity, chronic right 3rd toe ulcer/callus who started developing increasing edema with pain and blisters due to cellulitis and was admitted  on 10/24/2019 for IV Vanc-->IV ampicillin the next day.  He continued to have persistent leucocytosis with progressive cellulitis and ID recommended change po Zyvox as well as elevation to help manage edema. Bumex increased to help with fluid overload due to acute on chronic diastolic CHF.  BLE dopplers negative for DVT and CT RLE negative for osteomyelitis.  Dr. Mardelle Dominguez consulted for input on necrotizing cellulitis and patient take to OR 12/26 for I and D of bulla with dead tissue from distal medial leg, distal lateral ankle an distal medial thigh--no purulence noted. Plastics consulted for input and recommended daily dressing changes with improvement in wounds.   He has completed 14 day course of Linezolid on 01/06 and wounds healing well with resolution of leucocytosis.  Hospital course significant for ABLA due to bleeding from RLE--Eliquis held briefly, orthostatic hypotension with drop in BP 80/40 treated with fluid bolus and Dr. Oval Dominguez consulted for input. Bumex decreased as symptoms felt to be due to diuresis. He had few beast NSVT 1/7 am and coreg titrated upwards to 12.5 mg bid. Marland Kitchen  He is showing improvement in activity tolerance with decrease in DOE and lightheadedness but noted to be debilitated. CIR recommended due to functional decline.    Pt reports LBM today- denies constipation- Uses BiPAP from home   See Team Conference Notes for weekly updates to the plan of care

## 2019-11-14 ENCOUNTER — Inpatient Hospital Stay (HOSPITAL_COMMUNITY): Payer: PRIVATE HEALTH INSURANCE | Admitting: Physical Therapy

## 2019-11-14 LAB — OCCULT BLOOD X 1 CARD TO LAB, STOOL: Fecal Occult Bld: POSITIVE — AB

## 2019-11-14 NOTE — Progress Notes (Signed)
Patient has home unit CPAP at beside. Patient is able to place on and off when he is ready.

## 2019-11-14 NOTE — Progress Notes (Signed)
[place] PHYSICAL MEDICINE & REHABILITATION PROGRESS NOTE   Subjective/Complaints:   No issues overnite, ready for therapy eating and drinking well, IV not used   ROS- pt denies SOB, CP, abd pain, HA, vision change, N/V/D/constipation Objective:   No results found. Recent Labs    11/12/19 0531  WBC 7.6  HGB 8.6*  HCT 27.0*  PLT 470*   Recent Labs    11/12/19 0531  NA 137  K 4.0  CL 99  CO2 28  GLUCOSE 113*  BUN 12  CREATININE 1.05  CALCIUM 8.6*    Intake/Output Summary (Last 24 hours) at 11/14/2019 0755 Last data filed at 11/14/2019 0750 Gross per 24 hour  Intake 840 ml  Output 850 ml  Net -10 ml     Physical Exam: Vital Signs Blood pressure (!) 150/70, pulse 80, temperature 98.1 F (36.7 C), temperature source Oral, resp. rate 18, weight (!) 146 kg, SpO2 98 %.  Physical Exam  Nursing note and vitals reviewed.  Constitutional:.  Pt asleep but woke to verbal stimuli- lying supine in bed; wearing BiPAP and sleeping- said was awake earlier but napping; denies issues, NAD HENT:  Head: Normocephalic and atraumatic.  Mouth/Throat: No oropharyngeal exudate.  Eyes:  No scleral icterus.  Neck: No tracheal deviation present.  Cardiovascular:  RRR Respiratory:  CTA B/L- no W/R/R  GI:  Soft, protuberant, NT, ND, (+)BS  Musculoskeletal:  General: Edema present.  Cervical back: Normal range of motion and neck supple.  Comments: Well healed old B-TKR incisions.  UEs 5/5 in delt/biceps/triceps/WE/grip/finger abd LEs- RLE_ HF 2-/5, KE 2/5, DF 0/5, PF 2-/5, EHL 0/5 LLE- 5-/5 in all same muscles tested in RLE  Neurological: Ox3 Sensation is intact in all 4 extremities to LT B/L  Skin: Skin is warm and dry.  Dry flaky skin on right shin and bilateral feet. Dry callus on R-3rd toe with underlying dried blood.  Open incision R inner distal thigh- ~ >1 cm- dried blood surrounding opening RLE lower calf and foot/ankle- decreased redness, vaseline gauze- traces on  skin; flaking skin all over- chronic venous stasis changes as well HAS backside breakdown- unable to assess today Bruising all over UEs and has IV in L wrist- no signs of infiltration  Psychiatric: Appropriate affect     Assessment/Plan: 1. Functional deficits secondary to debility due to RLE cellulitis/weakness and s/p I&D s/p Linezolid which require 3+ hours per day of interdisciplinary therapy in a comprehensive inpatient rehab setting.  Physiatrist is providing close team supervision and 24 hour management of active medical problems listed below.  Physiatrist and rehab team continue to assess barriers to discharge/monitor patient progress toward functional and medical goals  Care Tool:  Bathing    Body parts bathed by patient: Right arm, Left arm, Chest, Abdomen, Front perineal area, Buttocks, Right upper leg, Left upper leg   Body parts bathed by helper: Right lower leg, Left lower leg     Bathing assist Assist Level: Set up assist     Upper Body Dressing/Undressing Upper body dressing   What is the patient wearing?: Pull over shirt    Upper body assist Assist Level: Set up assist    Lower Body Dressing/Undressing Lower body dressing      What is the patient wearing?: Underwear/pull up, Pants     Lower body assist Assist for lower body dressing: Minimal Assistance - Patient > 75%     Toileting Toileting    Toileting assist Assist for toileting: Independent with  assistive device Assistive Device Comment: urinal   Transfers Chair/bed transfer  Transfers assist     Chair/bed transfer assist level: Independent with assistive device Chair/bed transfer assistive device: Museum/gallery exhibitions officer assist      Assist level: Contact Guard/Touching assist Assistive device: Walker-rolling Max distance: 60   Walk 10 feet activity   Assist     Assist level: Contact Guard/Touching assist Assistive device: Walker-rolling   Walk 50  feet activity   Assist    Assist level: Contact Guard/Touching assist Assistive device: Walker-rolling    Walk 150 feet activity   Assist Walk 150 feet activity did not occur: Refused         Walk 10 feet on uneven surface  activity   Assist Walk 10 feet on uneven surfaces activity did not occur: Safety/medical concerns         Wheelchair     Assist Will patient use wheelchair at discharge?: No Type of Wheelchair: Manual    Wheelchair assist level: Contact Guard/Touching assist Max wheelchair distance: 85'    Wheelchair 50 feet with 2 turns activity    Assist    Wheelchair 50 feet with 2 turns activity did not occur: Safety/medical concerns   Assist Level: Contact Guard/Touching assist   Wheelchair 150 feet activity     Assist          Blood pressure (!) 150/70, pulse 80, temperature 98.1 F (36.7 C), temperature source Oral, resp. rate 18, weight (!) 146 kg, SpO2 98 %.  Medical Problem List and Plan:  1. Impaired ADLs/mobility and function secondary to debility due to cellulitis of RLE/s/p I&D s/p Linezolid x 14 days; and LE weakness  -patient may shower  -ELOS/Goals: 7-10 days; Mod I  CIR PT, OT  2. Antithrombotics:  -DVT/anticoagulation: Pharmaceutical: Other (comment)--Eliquis  -antiplatelet therapy: N/A  3. Pain Management: Continue oxycodone prn. Will add flexeril prn for spasms.  4. Mood: LCSW to follow for evaluation and support.  -antipsychotic agents: N/A  5. Neuropsych: This patient is capable of making decisions on his own behalf.  6. Skin/Wound Care: Air mattress overlay for DTI on sacrum. Resume soaks to callus on right 3rd toe. Add protein supplement to help promote wound healing. Will likely need dressing changes to RLE- will discuss with previous team  7. Fluids/Electrolytes/Nutrition: Monitor I/O. BUN/Creat nl 1/8 d/c IV                                            8. Necrotizing cellulitis: Leucocytosis has resolved and  thrombocytosis resolving. Continue daily dressing changes; off Linezolid after 14 days   1/8- will add MVI with Vit C per Plastics 9. A fib/few beats NSVT: Monitor HR tid. On Eliquis and coreg bid- Coreg was been reduced/dose due to orthostasis. Continue to monitor for orthostatic symptoms.  10. Chronic diastolic CHF: Compensated. Bumex resumed at 1mg  on 01/06. Aldactone and telmisartan on hold--monitor weight daily and for any signs of overload. On Coreg bid.  1/8- no weights yet- will follow  11. OSA: Compliant with BiPAP use.  12. Morbid obesity: BMI 44--educate on importance of weight loss to help promote health and mobility.  13. ABLA: Continue to trend H/H. Will order stool guaiacs. Last hgb 8.6 14. Acute renal failure: Has resolved. Will monitor to make sure no additional issues.   1/8- labs great  15. RLE foot drop- has R AFO but cannot use due to wounds on RLE- will d/w PT for gait.  16. RLE muscle spasms- do Flexeril as needed.   1/8- helping    LOS: 3 days A FACE TO FACE EVALUATION WAS PERFORMED  Charlett Blake 11/14/2019, 7:55 AM

## 2019-11-14 NOTE — Progress Notes (Signed)
MD notified of positive fecal occult blood test. Scheduled CBC ordered in AM.

## 2019-11-14 NOTE — Progress Notes (Addendum)
Physical Therapy Session Note  Patient Details  Name: Joel Dominguez MRN: [id number] Date of Birth: 1950-12-15  Today's Date: 11/14/2019 PT Individual Time: 0802-0927 PT Individual Time Calculation (min): 85 min   Short Term Goals: Week 1:  PT Short Term Goal 1 (Week 1): equals LTG d/t LOS  Skilled Therapeutic Interventions/Progress Updates:  Pt was seen bedside in the am. Pt performed all sit to stand and stand pivot transfers with S and rolling walker. Pt ambulated 80, 120 and 75 feet with rolling walker and S. Pt ascended/descended 4 stairs with 1 rail and cane with c/g and verbal cues. Pt performed mini squats 3 sets x 10 reps each. Pt rode Nu step level 3 for 3, 2, 2, 2, and 1 minutes.  Pt returned to room. Pt ambulated to bathroom with rolling walker and S. Pt performed toilet transfers with grab bar and S. Pt left on toilet with call bell within reach. Pt progressing well.   Therapy Documentation Precautions:  Precautions Precautions: Fall Precaution Comments: watch BP, decr skin intergrity on righ tLE Required Braces or Orthoses: Other Brace Other Brace: Has right AFO PTA, unable to don today d/t edema/dessing.  Post-op shoe available for use Restrictions Weight Bearing Restrictions: No General:   Pain: No c/o pain.   Therapy/Group: Individual Therapy  [name] 11/14/2019, 12:09 PM

## 2019-11-14 NOTE — Progress Notes (Signed)
MD notified that right leg has no more discharges  nor blisters noted ; dry and healing re: update dressing care. New orders noted.

## 2019-11-15 ENCOUNTER — Inpatient Hospital Stay (HOSPITAL_COMMUNITY): Payer: PRIVATE HEALTH INSURANCE | Admitting: Occupational Therapy

## 2019-11-15 ENCOUNTER — Inpatient Hospital Stay (HOSPITAL_COMMUNITY): Payer: PRIVATE HEALTH INSURANCE | Admitting: Physical Therapy

## 2019-11-15 ENCOUNTER — Inpatient Hospital Stay (HOSPITAL_COMMUNITY): Payer: PRIVATE HEALTH INSURANCE

## 2019-11-15 LAB — BASIC METABOLIC PANEL
Anion gap: 7 (ref 5–15)
BUN: 12 mg/dL (ref 8–23)
CO2: 29 mmol/L (ref 22–32)
Calcium: 8.3 mg/dL — ABNORMAL LOW (ref 8.9–10.3)
Chloride: 101 mmol/L (ref 98–111)
Creatinine, Ser: 1.08 mg/dL (ref 0.61–1.24)
GFR calc Af Amer: >60 mL/min (ref >60)
GFR calc non Af Amer: >60 mL/min (ref >60)
Glucose, Bld: 106 mg/dL — ABNORMAL HIGH (ref 70–99)
Potassium: 3.8 mmol/L (ref 3.5–5.1)
Sodium: 137 mmol/L (ref 135–145)

## 2019-11-15 LAB — CBC
HCT: 26 % — ABNORMAL LOW (ref 39.0–52.0)
Hemoglobin: 8.1 g/dL — ABNORMAL LOW (ref 13.0–17.0)
MCH: 28.7 pg (ref 26.0–34.0)
MCHC: 31.2 g/dL (ref 30.0–36.0)
MCV: 92.2 fL (ref 80.0–100.0)
Platelets: 386 10*3/uL (ref 150–400)
RBC: 2.82 MIL/uL — ABNORMAL LOW (ref 4.22–5.81)
RDW: 16 % — ABNORMAL HIGH (ref 11.5–15.5)
WBC: 7.4 10*3/uL (ref 4.0–10.5)
nRBC: 0 % (ref 0.0–0.2)

## 2019-11-15 NOTE — Progress Notes (Signed)
[place] PHYSICAL MEDICINE & REHABILITATION PROGRESS NOTE   Subjective/Complaints:   Doing therapy- moving better- ate breakfast; pain mostly gone, even muscle spasms.  Wounds doing better per pt.  Over weekend (+)fecal occult  ROS- pt denies SOB, CP, abd pain, HA, vision change, N/V/D/constipation Objective:   No results found. Recent Labs    11/15/19 0559  WBC 7.4  HGB 8.1*  HCT 26.0*  PLT 386   Recent Labs    11/15/19 0559  NA 137  K 3.8  CL 101  CO2 29  GLUCOSE 106*  BUN 12  CREATININE 1.08  CALCIUM 8.3*    Intake/Output Summary (Last 24 hours) at 11/15/2019 0834 Last data filed at 11/15/2019 0649 Gross per 24 hour  Intake 600 ml  Output 300 ml  Net 300 ml     Physical Exam: Vital Signs Blood pressure 135/80, pulse 84, temperature 98 F (36.7 C), temperature source Oral, resp. rate 16, weight (!) 146.4 kg, SpO2 98 %.  Physical Exam  Nursing note and vitals reviewed.  Constitutional:.  Pt awake, sitting up ; finished breakfast; BiPAP to side;, NAD HENT:  Head: Normocephalic and atraumatic.  Mouth/Throat: No oropharyngeal exudate.  Eyes:  No scleral icterus.  Neck: No tracheal deviation present.  Cardiovascular:  RRR Respiratory:  CTA B/L- no W/R/R  GI:  Soft, protuberant, NT, ND, (+)BS  Musculoskeletal:  General: Edema present.  Cervical back: Normal range of motion and neck supple.  Comments: Well healed old B-TKR incisions.  UEs 5/5 in delt/biceps/triceps/WE/grip/finger abd LEs- RLE_ HF 2-/5, KE 2/5, DF 0/5, PF 2-/5, EHL 0/5 LLE- 5-/5 in all same muscles tested in RLE  Neurological: Ox3 Sensation is intact in all 4 extremities to LT B/L  Skin: Skin is warm and dry.  Dry flaky skin on right shin and bilateral feet. Dry callus on R-3rd toe with underlying dried blood.  Open incision R inner distal thigh- ~ >1 cm- dried blood surrounding opening Less erythema and flaking skin in pictures of RLE shown from weekend HAS backside breakdown-  unable to assess today Bruising all over UEs Psychiatric: Appropriate affect     Assessment/Plan: 1. Functional deficits secondary to debility due to RLE cellulitis/weakness and s/p I&D s/p Linezolid which require 3+ hours per day of interdisciplinary therapy in a comprehensive inpatient rehab setting.  Physiatrist is providing close team supervision and 24 hour management of active medical problems listed below.  Physiatrist and rehab team continue to assess barriers to discharge/monitor patient progress toward functional and medical goals  Care Tool:  Bathing    Body parts bathed by patient: Right arm, Left arm, Chest, Abdomen, Front perineal area, Buttocks, Right upper leg, Left upper leg   Body parts bathed by helper: Right lower leg, Left lower leg     Bathing assist Assist Level: Set up assist     Upper Body Dressing/Undressing Upper body dressing   What is the patient wearing?: Pull over shirt    Upper body assist Assist Level: Set up assist    Lower Body Dressing/Undressing Lower body dressing      What is the patient wearing?: Underwear/pull up, Pants     Lower body assist Assist for lower body dressing: Minimal Assistance - Patient > 75%     Toileting Toileting    Toileting assist Assist for toileting: Independent with assistive device Assistive Device Comment: urinal   Transfers Chair/bed transfer  Transfers assist     Chair/bed transfer assist level: Supervision/Verbal cueing Chair/bed transfer  assistive device: Museum/gallery exhibitions officer assist      Assist level: Supervision/Verbal cueing Assistive device: Walker-rolling Max distance: 120   Walk 10 feet activity   Assist     Assist level: Supervision/Verbal cueing Assistive device: Walker-rolling   Walk 50 feet activity   Assist    Assist level: Supervision/Verbal cueing Assistive device: Walker-rolling    Walk 150 feet activity   Assist Walk 150  feet activity did not occur: Refused         Walk 10 feet on uneven surface  activity   Assist Walk 10 feet on uneven surfaces activity did not occur: Safety/medical concerns         Wheelchair     Assist Will patient use wheelchair at discharge?: No Type of Wheelchair: Manual    Wheelchair assist level: Contact Guard/Touching assist Max wheelchair distance: 63'    Wheelchair 50 feet with 2 turns activity    Assist    Wheelchair 50 feet with 2 turns activity did not occur: Safety/medical concerns   Assist Level: Contact Guard/Touching assist   Wheelchair 150 feet activity     Assist          Blood pressure 135/80, pulse 84, temperature 98 F (36.7 C), temperature source Oral, resp. rate 16, weight (!) 146.4 kg, SpO2 98 %.  Medical Problem List and Plan:  1. Impaired ADLs/mobility and function secondary to debility due to cellulitis of RLE/s/p I&D s/p Linezolid x 14 days; and LE weakness  -patient may shower  -ELOS/Goals: 7-10 days; Mod I  CIR PT, OT  2. Antithrombotics:  -DVT/anticoagulation: Pharmaceutical: Other (comment)--Eliquis  -antiplatelet therapy: N/A  3. Pain Management: Continue oxycodone prn. Will add flexeril prn for spasms.  4. Mood: LCSW to follow for evaluation and support.  -antipsychotic agents: N/A  5. Neuropsych: This patient is capable of making decisions on his own behalf.  6. Skin/Wound Care: Air mattress overlay for DTI on sacrum. Resume soaks to callus on right 3rd toe. Add protein supplement to help promote wound healing. Will likely need dressing changes to RLE- will discuss with previous team  7. Fluids/Electrolytes/Nutrition: Monitor I/O. BUN/Creat nl 1/8 d/c IV         8. Necrotizing cellulitis: Leucocytosis has resolved and thrombocytosis resolving. Continue daily dressing changes; off Linezolid after 14 days   1/8- will add MVI with Vit C per Plastics 9. A fib/few beats NSVT: Monitor HR tid. On Eliquis and coreg bid-  Coreg was been reduced/dose due to orthostasis. Continue to monitor for orthostatic symptoms.  10. Chronic diastolic CHF: Compensated. Bumex resumed at 1mg  on 01/06. Aldactone and telmisartan on hold--monitor weight daily and for any signs of overload. On Coreg bid.  1/8- no weights yet- will follow   Filed Weights   11/14/19 0550 11/15/19 0700  Weight: (!) 146 kg (!) 146.4 kg    1/11- weight stable  11. OSA: Compliant with BiPAP use.  12. Morbid obesity: BMI 44--educate on importance of weight loss to help promote health and mobility.  13. ABLA: Continue to trend H/H. Will order stool guaiacs. Last hgb 8.6  1/11- fecal occult (+)- Hb down to 8.1- will need f/u outpatient.  14. Acute renal failure: Has resolved. Will monitor to make sure no additional issues.   1/8- labs great 15. RLE foot drop- has R AFO but cannot use due to wounds on RLE- will d/w PT for gait.  16. RLE muscle spasms- do Flexeril as needed.  1/8- helping    LOS: 4 days A FACE TO FACE EVALUATION WAS PERFORMED  Gatsby Chismar 11/15/2019, 8:34 AM

## 2019-11-15 NOTE — Progress Notes (Signed)
Physical Therapy Session Note  Patient Details  Name: Joel Dominguez MRN: [id number] Date of Birth: 12-Jul-1951  Today's Date: 11/15/2019 PT Individual Time: 1100-1200 PT Individual Time Calculation (min): 60 min   Short Term Goals: Week 1:  PT Short Term Goal 1 (Week 1): equals LTG d/t LOS  Skilled Therapeutic Interventions/Progress Updates:     Patient walking from bathroom with NT upon PT arrival. Patient alert and agreeable to PT session. Patient denied pain during session. Educated patient on performing daily foot checks, washing his feet daily, proper footwear, and follow-up with pediatrist and PCP when signs of skin breakdown appear.   Therapeutic Activity: Transfers: Patient performed sit to/from stand x5 with supervision using RW. Provided verbal cues for reaching back to sit and hand placement to stand.  Gait Training:  Patient ambulated 150 feet x2 using a RW with CGA. Ambulated with decreased R foot clearance, improved on second trial with DF ACE wrap, and mild R extensor thrust. Provided verbal cues for erect posture, increased R quad activation in stance, and increased R step height to improve foot clearance due to foot drop. He went up/down 4-6" steps using B rails and 2-6" steps using light touch on B rails to simulate hand on walls in the garage entrance at home with CGA. Performed step-to gait pattern leading with L foot each trial. Provided cues for R foot placement on step.  Patient in recliner with his wife in the room at end of session with breaks locked and all needs within reach. Discussed POC, PT goals, and d/c planning. Educated on team conference tomorrow, patient asked to be d/c by Wednesday.    Therapy Documentation Precautions:  Precautions Precautions: Fall Precaution Comments: watch BP, decr skin intergrity on righ tLE Required Braces or Orthoses: Other Brace Other Brace: Has right AFO PTA, unable to don today d/t edema/dessing.  Post-op shoe available for  use Restrictions Weight Bearing Restrictions: No    Therapy/Group: Individual Therapy  Chadrick Sprinkle L Xhaiden Coombs PT, DPT  11/15/2019, 4:59 PM

## 2019-11-15 NOTE — Care Management (Signed)
Inpatient [facility] Individual Statement of Services  Patient Name:  Joel Dominguez  Date:  11/12/2019  Welcome to the [facility].  Our goal is to provide you with an individualized program based on your diagnosis and situation, designed to meet your specific needs.  With this comprehensive rehabilitation program, you will be expected to participate in at least 3 hours of rehabilitation therapies Monday-Friday, with modified therapy programming on the weekends.  Your rehabilitation program will include the following services:  Physical Therapy (PT), Occupational Therapy (OT), 24 hour per day rehabilitation nursing, Case Management (Social Worker), Rehabilitation Medicine, Nutrition Services and Pharmacy Services  Weekly team conferences will be held on Tuesdays to discuss your progress.  Your Social Worker will talk with you frequently to get your input and to update you on team discussions.  Team conferences with you and your family in attendance may also be held.  Expected length of stay: 5-7 days   Overall anticipated outcome: modified independent  Depending on your progress and recovery, your program may change. Your Social Worker will coordinate services and will keep you informed of any changes. Your Social Worker's name and contact numbers are listed  below.  The following services may also be recommended but are not provided by the [facility] will be made to provide these services after discharge if needed.  Arrangements include referral to agencies that provide these services.  Your insurance has been verified to be:  Medicare; [employer] Your primary doctor is:  [place]  Pertinent information will be shared with your doctor and your insurance company.  Social Worker:  [place], [place] or ([phone]    Information discussed with and copy given to patient by: Lennart Pall, 11/12/2019, 9:30 AM

## 2019-11-15 NOTE — Progress Notes (Signed)
Physical Therapy Session Note  Patient Details  Name: Joel Dominguez MRN: [id number] Date of Birth: 03-06-1951  Today's Date: 11/15/2019 PT Individual Time: 0802-0858 PT Individual Time Calculation (min): 56 min   Short Term Goals: Week 1:  PT Short Term Goal 1 (Week 1): equals LTG d/t LOS  Skilled Therapeutic Interventions/Progress Updates: Patient presents in bed , agreeable to PT but not bathed.  Patient transfers sup to sit w/ SBA using siderail.  Patient transferred multiple trials sit to stand w/ SBA and verbal cues for hand placement.  Patient step-pivot to w/c / SBA, negotiated into BR.  Patient transferred SPT to shower chair and set-up for shower.  Removed dressing for shower.  Patient bathed complete body but required assist to dry bilateral LEs completely for safety.  Patient donned underwear, shorts and shirt w/ set-up once transferred back to w/c.  Patient performed SPT again to recliner chair, to await dressing change.  Patient performed seated there ex to increase strength to include calf raises, LAQ, hip flexion and abd/add 3 x 15.  Patient awaiting dressing change in recliner chair w/ LEs elevated and all needs available.     Therapy Documentation Precautions:  Precautions Precautions: Fall Precaution Comments: watch BP, decr skin intergrity on righ tLE Required Braces or Orthoses: Other Brace Other Brace: Has right AFO PTA, unable to don today d/t edema/dessing.  Post-op shoe available for use Restrictions Weight Bearing Restrictions: No General:   Vital Signs: Therapy Vitals Temp: 98 F (36.7 C) Temp Source: Oral Pulse Rate: 84 Resp: 16 BP: 135/80 Patient Position (if appropriate): Lying Oxygen Therapy SpO2: 98 % Pain: 0/10 Pain Assessment Pain Scale: 0-10    Therapy/Group: Individual Therapy  Ladoris Gene 11/15/2019, 10:37 AM

## 2019-11-15 NOTE — Progress Notes (Signed)
Social Work Assessment and Plan   Patient Details  Name: Joel Dominguez MRN: [id number] Date of Birth: Nov 11, 1950  Today's Date: 11/12/2019  Problem List:  Patient Active Problem List   Diagnosis Date Noted  . Physical debility 11/11/2019  . Acute blood loss anemia 11/05/2019  . Chronic congestive heart failure ([place])   . Longstanding persistent atrial fibrillation ([place])   . Necrotizing celluliitis of lower leg ([place]), right 10/24/2019  . Chronic foot ulcer ([place]) 08/08/2019  . Prediabetes 08/08/2019  . Left leg weakness 08/08/2019  . High risk medication use 08/08/2019  . Chronic ulcer of toe ([place]) 08/04/2019  . Pain due to onychomycosis of toenails of both feet 04/30/2019  . Coagulation disorder ([place]) 04/30/2019  . Chronic bilateral low back pain with right-sided sciatica 03/03/2018  . Lumbosacral radiculopathy 01/22/2018  . Osteoarthritis 11/19/2017  . Vitamin D deficiency 11/19/2017  . Positive for microalbuminuria 11/19/2017  . Hypertensive heart disease with heart failure ([place]) 11/10/2017  . 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 fascicular block) 11/07/2017  . Aortic stenosis 11/06/2017  . H/O noncompliance with medical treatment, presenting hazards to health 10/22/2017  . h/o Stroke 10/08/2016  . Atherosclerotic heart disease of native coronary artery with unspecified angina pectoris ([place]) 10/08/2016  . Sleep apnea with use of continuous positive airway pressure (CPAP) 10/08/2016  . Chronic combined systolic and diastolic heart failure ([place]) 10/08/2016  . Generalized OA 10/08/2016  . Foot drop- R 10/08/2016  . Lumbar spinal stenosis 10/08/2016  . Pulmonary hypertension ([place]) 10/08/2016  . Chronic atrial fibrillation ([place]) 10/08/2016  . Chronic anticoagulation 10/08/2016  . Obesity, Class III, BMI 40-49.9 (morbid obesity) ([place]) 10/08/2016   Past Medical History:  Past Medical History:  Diagnosis Date  . A-fib 10/08/2016  . Aortic stenosis 11/06/2017  .  Atherosclerotic heart disease of native coronary artery with unspecified angina pectoris ([place]) 10/08/2016  . Cellulitis 11/2019   right lower extremity  . CHF (congestive heart failure) ([place])   . Chronic anticoagulation 10/08/2016  . Foot drop- R 10/08/2016   Patient known foot drop for many years due to lumbar spinal stenosis.-Over the past couple of months symptoms have gotten worse.  He feels he is tripping more and cannot lift his foot up as much. Toes are curling and calluses on end of toes.    - Did see spinal surgeon many yrs ago.  Declined sx at that time.     . Generalized OA 10/08/2016   Ortho doc-  Dr. [facility] in [place]- both knees replaced 08, 09.     . H/O noncompliance with medical treatment, presenting hazards to health 10/22/2017  . Heart disease   . Hypertension   . Obesity, Class III, BMI 40-49.9 (morbid obesity) ([place]) 10/08/2016  . OSA (obstructive sleep apnea) 10/08/2016   Bi-PAP nitely- been 20+ yrs now  . Pulmonary hypertension ([place]) 10/08/2016  . Spinal stenosis, lumbar region, with neurogenic claudication 10/08/2016   Back specialist in [place]-    only surgeon he would use- pt declined referral here for back pain mgt    . Stroke [facility])    Past Surgical History:  Past Surgical History:  Procedure Laterality Date  . I & D EXTREMITY Right 10/30/2019   Procedure: IRRIGATION AND DEBRIDEMENT EXTREMITY, right leg possible wound vac placement;  Surgeon: Marchia Bond, MD;  Location: [facility];  Service: Orthopedics;  Laterality: Right;  . REPLACEMENT TOTAL KNEE Bilateral   . TIBIA FRACTURE SURGERY     Social History:  reports that he has never smoked. He has never used smokeless tobacco. He reports previous alcohol use. He reports that he does not use drugs.  Family / Support Systems Marital Status: Married Patient Roles: Spouse, Parent Spouse/Significant Other: wife, Joel Dominguez @ [phone] Anticipated Caregiver: wife Ability/Limitations of Caregiver: wife retired , no  limitations Caregiver Availability: 24/7 Family Dynamics: Patient describes wife is extremely supportive.  Social History Preferred language: English Religion: [employer] Cultural Background: N/A Read: Yes Write: Yes Employment Status: Retired Public relations account executive Issues: None Guardian/Conservator: None -per MD, patient is capable of making decisions on his own behalf   Abuse/Neglect Abuse/Neglect Assessment Can Be Completed: Yes Physical Abuse: Denies Verbal Abuse: Denies Sexual Abuse: Denies Exploitation of patient/patient's resources: Denies Self-Neglect: Denies  Emotional Status Pt's affect, behavior and adjustment status: Patient very pleasant and talkative.  Completes assessment interview without any difficulty.  Enjoys talking about his [place] bills and getting up tomorrow.  Patient admits frustration with his current limitations but hopeful to make good recovery on CIR.  Mood and outlook overall positive. Recent Psychosocial Issues: None Psychiatric History: None Substance Abuse History: None  Patient / Family Perceptions, Expectations & Goals Pt/Family understanding of illness & functional limitations: Patient and wife with good understanding of medical issues and current debility/need for CIR. Premorbid pt/family roles/activities: Patient completely independent PTA. Anticipated changes in roles/activities/participation: Little change anticipated if patient able to reach modified independent goals Pt/family expectations/goals: Patient hopeful to be ready for discharge early next week.  Community Resources [employer]: None Premorbid Home Care/DME Agencies: None Transportation available at discharge: Yes  Discharge Planning Living Arrangements: Spouse/significant other Support Systems: Spouse/significant other Type of Residence: Private residence Insurance Resources: Chartered certified accountant Resources: [place] Referred: No Living  Expenses: Own Money Management: Patient Does the patient have any problems obtaining your medications?: No Home Management: Patient and wife manage home. Patient/Family Preliminary Plans: Patient to return home with his wife is primary support person. Social Work Anticipated Follow Up Needs: HH/OP Expected length of stay: 5 to 7 days  Clinical Impression Pleasant, talkative gentleman here following surgeries for abscess and with debility.  Good support at home from his wife who can provide 24/7 support.  Patient very motivated for therapy and hopeful his stay will be short.  Mood is positive and optimistic.  We will follow for support and discharge planning needs.  Wade Sigala 11/12/2019, 9:28 AM

## 2019-11-15 NOTE — Plan of Care (Signed)
  Problem: Consults Goal: RH GENERAL PATIENT EDUCATION Description: See Patient Education module for education specifics. Outcome: Progressing   Problem: RH SKIN INTEGRITY Goal: RH STG SKIN FREE OF INFECTION/BREAKDOWN Description: No new breakdown with min assist  Outcome: Progressing Goal: RH STG MAINTAIN SKIN INTEGRITY WITH ASSISTANCE Description: STG Maintain Skin Integrity With [place]. Outcome: Progressing Goal: RH STG ABLE TO PERFORM INCISION/WOUND CARE W/ASSISTANCE Description: STG Able To Perform Incision/Wound Care With [employer]. Outcome: Progressing   Problem: RH PAIN MANAGEMENT Goal: RH STG PAIN MANAGED AT OR BELOW PT'S PAIN GOAL Description: < 3 out of 10.  Outcome: Progressing

## 2019-11-15 NOTE — Progress Notes (Signed)
Occupational Therapy Session Note  Patient Details  Name: Joel Dominguez MRN: [id number] Date of Birth: 05-Jul-1951  Today's Date: 11/15/2019 OT Individual Time: 1300-1400 OT Individual Time Calculation (min): 60 min    Short Term Goals: Week 1:  OT Short Term Goal 1 (Week 1): LTG =STG  Skilled Therapeutic Interventions/Progress Updates:    patient seated in recliner, wife present for session.  Reviewed and practiced ace wrapping right lower leg into dorsiflexion after demonstration wife able to complete with min cues.  Completed ambulation on unit with CGA/CS using RW.  Provided long handled mirror and reviewed use for skin inspection.  Reviewed proper use of walker with reach and transport of items.  Completed standing balance and weight shift activities in stance with CS.  He remained seated in recliner at close of session, wife present and call bell in reach.    Therapy Documentation Precautions:  Precautions Precautions: Fall Precaution Comments: watch BP, decr skin intergrity on righ tLE Required Braces or Orthoses: Other Brace Other Brace: Has right AFO PTA, unable to don today d/t edema/dessing.  Post-op shoe available for use Restrictions Weight Bearing Restrictions: No General:   Vital Signs: Therapy Vitals Temp: 98.4 F (36.9 C) Temp Source: Oral Pulse Rate: 85 Resp: 14 BP: 135/63 Patient Position (if appropriate): Lying Oxygen Therapy SpO2: 96 % O2 Device: Room Air Pain: Pain Assessment Pain Scale: 0-10 Pain Score: 0-No pain  Therapy/Group: Individual Therapy  Carlos Levering 11/15/2019, 3:42 PM

## 2019-11-16 ENCOUNTER — Inpatient Hospital Stay (HOSPITAL_COMMUNITY): Payer: PRIVATE HEALTH INSURANCE

## 2019-11-16 ENCOUNTER — Inpatient Hospital Stay (HOSPITAL_COMMUNITY): Payer: PRIVATE HEALTH INSURANCE | Admitting: Occupational Therapy

## 2019-11-16 NOTE — Progress Notes (Signed)
Physical Therapy Discharge Summary  Patient Details  Name: Joel Dominguez MRN: [id number] Date of Birth: 06-Nov-1950   Patient has met 6 of 7 long term goals due to improved activity tolerance, improved balance, improved postural control, increased strength, increased range of motion, decreased pain and ability to compensate for deficits.  Patient to discharge at an ambulatory level Supervision with RW. Patient's care partner is independent to provide the necessary physical assistance at discharge.  Reasons goals not met: Patient unable to perform car transfer in car simulator without min A for R LE, however, patient is able to lift R LE to standard SUV floor height and patient's wife able to lift R LE if patient requires assistance.   Recommendation:  Patient will benefit from ongoing skilled PT services in home health setting to continue to advance safe functional mobility, address ongoing impairments in balance, strength, functional mobility, activity tolerance, patient/caregiver education, and minimize fall risk.  Equipment: Bariatric RW  Reasons for discharge: treatment goals met  Patient/family agrees with progress made and goals achieved: Yes  PT Discharge Precautions/Restrictions Precautions Precautions: Fall Precaution Comments: Post op shoe on R LE, DF wrap for foot drop on R Restrictions Weight Bearing Restrictions: No Vision/Perception  Perception Perception: Within Functional Limits Praxis Praxis: Intact  Cognition Overall Cognitive Status: Within Functional Limits for tasks assessed Arousal/Alertness: Awake/alert Attention: Focused;Sustained Focused Attention: Appears intact Sustained Attention: Appears intact Memory: Appears intact Awareness: Appears intact Problem Solving: Appears intact Safety/Judgment: Appears intact Sensation Sensation Light Touch Impaired Details: Impaired RLE Proprioception: Impaired Detail Proprioception Impaired Details: Impaired  RLE Additional Comments: Intermittent decreased sensation in R LE just before onset of cellulitis PTA. Coordination Gross Motor Movements are Fluid and Coordinated: No Coordination and Movement Description: Right foot drop, circumduction for right LE advancement Finger Nose Finger Test: [facility] Heel Shin Test: Unable due to LE weakness Motor  Motor Motor: Other (comment) Motor - Skilled Clinical Observations: Right LE weakness and decreased activity tolerance generally d/t recent hospital and medical care. Motor - Discharge Observations: Improved R LE strength and activity tolerance with all functional mobility.  Mobility Bed Mobility Bed Mobility: Supine to Sit;Sit to Supine;Rolling Right;Rolling Left Rolling Right: Independent Rolling Left: Independent Supine to Sit: Independent Sit to Supine: Independent Transfers Transfers: Sit to Stand;Stand Pivot Transfers;Stand to Sit Sit to Stand: Independent with assistive device Stand to Sit: Independent with assistive device Stand Pivot Transfers: Independent with assistive device Transfer (Assistive device): Rolling walker Locomotion  Gait Ambulation: Yes Gait Assistance: Supervision/Verbal cueing Gait Distance (Feet): 150 Feet Assistive device: Rolling walker Gait Assistance Details: Verbal cues for gait pattern Gait Assistance Details: Cues for R foot clearance to reduce fall risk with fatigue. Gait Gait: Yes Gait Pattern: Decreased hip/knee flexion - right;Right circumduction;Step-through pattern;Decreased step length - right;Decreased stride length;Decreased dorsiflexion - right;Right foot flat;Left foot flat;Right genu recurvatum;Trendelenburg;Trunk flexed;Decreased trunk rotation Gait velocity: reduced Stairs / Additional Locomotion Stairs: Yes Stairs Assistance: Contact Guard/Touching assist Stair Management Technique: Forwards;Two rails Number of Stairs: 4 Height of Stairs: 6 Wheelchair Mobility Wheelchair Mobility:  No(Patient is a Engineer, petroleum.)  Trunk/Postural Assessment  Cervical Assessment Cervical Assessment: Within Functional Limits Thoracic Assessment Thoracic Assessment: Within Functional Limits Lumbar Assessment Lumbar Assessment: Within Functional Limits Postural Control Postural Control: Within Functional Limits  Balance Balance Balance Assessed: Yes Dynamic Sitting Balance Dynamic Sitting - Balance Support: No upper extremity supported;During functional activity;Feet supported Dynamic Sitting - Level of Assistance: 7: Independent Dynamic Sitting - Balance Activities: Lateral lean/weight shifting;Forward lean/weight shifting;Reaching for  objects Sitting balance - Comments: steady sitting EOB Static Standing Balance Static Standing - Balance Support: During functional activity;No upper extremity supported Static Standing - Level of Assistance: 7: Independent Dynamic Standing Balance Dynamic Standing - Balance Support: During functional activity Dynamic Standing - Level of Assistance: 4: Min assist;6: Modified independent (Device/Increase time) Dynamic Standing - Balance Activities: Other (comment);Lateral lean/weight shifting;Forward lean/weight shifting;Reaching for objects Extremity Assessment  RLE Assessment RLE Assessment: Exceptions to [facility] Active Range of Motion (AROM) Comments: Limited hip flexion in sitting due to weakness and body habitus General Strength Comments: Grossly in sitting: hip flexion 3+/5, knee flexion/extension 4+/5, DF 2-/5, PR 4+/5 LLE Assessment LLE Assessment: Within Functional Limits    Cherie L Grunenberg PT, DPT  11/16/2019, 8:19 AM   Lars Masson, PT, DPT, CBIS 11/16/19 3:06 PM

## 2019-11-16 NOTE — Progress Notes (Signed)
Physical Therapy Session Note  Patient Details  Name: Joel Dominguez MRN: [id number] Date of Birth: [birth year]  Today's Date: 11/16/2019 PT Individual Time: 0800-0900 PT Individual Time Calculation (min): 60 min   Short Term Goals: Week 1:  PT Short Term Goal 1 (Week 1): equals LTG d/t LOS  Skilled Therapeutic Interventions/Progress Updates:     Patient in bed asleep upon PT arrival. Patient easily aroused and agreeable to PT session. Patient denied pain during session.  Therapeutic Activity: Bed Mobility: Patient performed supine to/from sit in a flat hospital bed and ADL bed independently with increased time to complete task. Joel Dominguez also performed rolling in the ADL bed independently. The patient donned a shirt and doffed/donned briefs and shorts independently with use of a RW. Joel Dominguez required min-mod A for donning L tennis shoe and R post-op shoe and total A for applying DF wrap to R foot to manage foot drop with mobility.  Transfers: Patient performed sit to/from stand and stand pivot transfers with mod I using a bariatric RW throughout session from hospital bed, ADL bed, hospital recliner, ADL recliner, toilet, and w/c. Performed toileting with mod I using RW. Joel Dominguez also performed a simulated midsize SUV height car transfer with supervision, had difficulty putting R foot in car simulator and required min A, but able to clear standard SUV height to bring foot in on his own.   Gait Training:  Patient ambulated 155 feet on level surfaces and up/down a ramp, 10 feet of mulch, and up/down a curb with supervision using a bariatric RW with R post-op shoe and DF wrap. Ambulates with increased R foot drag with fatigue. Joel Dominguez also ambulated 34 feet on carpet in ADL apartment and 15 feet to/from the bathroom in the room using a bariatric RW with mod I. Ambulated with forward trunk lean, decreased step length and height on R with mild circumduction gait, and decreased gait speed. Provided verbal cues for safe use of  RW over unlevel surfaces.  Patient in recliner in room at end of session with breaks locked and all needs within reach. Educated on energy conservation techniques, fall risk/prevetnion, home modifications, activation of emergency response in the event of a fall, and exercise promotion throughout session.    Therapy Documentation Precautions:  Precautions Precautions: Fall Precaution Comments: Post op shoe on R LE, DF wrap for foot drop on R Required Braces or Orthoses: Other Brace Other Brace: Has right AFO PTA, unable to don today d/t edema/dessing.  Post-op shoe available for use Restrictions Weight Bearing Restrictions: No General:   Vital Signs:   Pain:   Mobility: Bed Mobility Bed Mobility: Supine to Sit;Sit to Supine;Rolling Right;Rolling Left Rolling Right: Independent Rolling Left: Independent Supine to Sit: Independent Sit to Supine: Independent Transfers Transfers: Sit to Stand;Stand Pivot Transfers;Stand to Sit Sit to Stand: Independent with assistive device Stand to Sit: Independent with assistive device Stand Pivot Transfers: Independent with assistive device Transfer (Assistive device): Rolling walker Locomotion : Gait Ambulation: Yes Gait Assistance: Supervision/Verbal cueing Gait Distance (Feet): 150 Feet Assistive device: Rolling walker Gait Assistance Details: Verbal cues for gait pattern Gait Assistance Details: Cues for R foot clearance to reduce fall risk with fatigue. 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, petroleum.)  Trunk/Postural Assessment :    Balance: Balance Balance Assessed: Yes Dynamic Sitting Balance Dynamic Sitting - Balance Support: No upper extremity supported;During functional activity;Feet supported Dynamic Sitting - Level of Assistance: 7: Independent Dynamic Sitting - Balance Activities: Lateral lean/weight shifting;Forward lean/weight shifting;Reaching for objects Sitting balance - Comments: steady sitting EOB Static Standing Balance Static Standing - Balance Support: During functional activity;No upper extremity supported Static Standing - Level of Assistance: 7: Independent Dynamic Standing Balance Dynamic Standing - Balance Support: During functional activity Dynamic Standing - Level of Assistance: 4: Min assist;6: Modified independent (Device/Increase time) Dynamic Standing - Balance Activities: Other (comment);Lateral lean/weight shifting;Forward lean/weight shifting;Reaching for objects Exercises:   Other Treatments:      Therapy/Group: Individual Therapy  Xzavien Harada L Maxton Noreen PT, DPT  11/16/2019, 12:19 PM

## 2019-11-16 NOTE — Patient Care Conference (Signed)
Inpatient RehabilitationTeam Conference and Plan of Care Update Date: 11/16/2019   Time: 11:35 AM    Patient Name: Joel Dominguez      Medical Record Number: [id number]  Date of Birth: 1951/06/19 Sex: Male         Room/Bed: 4M03C/4M03C-01 Payor Info: Payor: MEDICARE / Plan: MEDICARE PART A AND B / Product Type: *No Product type* /    Admit Date/Time:  11/11/2019  5:33 PM  Primary Diagnosis:  Physical debility  Patient Active Problem List   Diagnosis Date Noted  . Physical debility 11/11/2019  . Acute blood loss anemia 11/05/2019  . Chronic congestive heart failure ([place])   . Longstanding persistent atrial fibrillation ([employer] [facility])   . Necrotizing celluliitis of lower leg ([place]), right 10/24/2019  . Chronic foot ulcer ([place]) 08/08/2019  . Prediabetes 08/08/2019  . Left leg weakness 08/08/2019  . High risk medication use 08/08/2019  . Chronic ulcer of toe ([place]) 08/04/2019  . Pain due to onychomycosis of toenails of both feet 04/30/2019  . Coagulation disorder ([place]) 04/30/2019  . Chronic bilateral low back pain with right-sided sciatica 03/03/2018  . Lumbosacral radiculopathy 01/22/2018  . Osteoarthritis 11/19/2017  . Vitamin D deficiency 11/19/2017  . Positive for microalbuminuria 11/19/2017  . Hypertensive heart disease with heart failure ([place]) 11/10/2017  . 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 fascicular block) 11/07/2017  . Aortic stenosis 11/06/2017  . H/O noncompliance with medical treatment, presenting hazards to health 10/22/2017  . h/o Stroke 10/08/2016  . Atherosclerotic heart disease of native coronary artery with unspecified angina pectoris ([place]) 10/08/2016  . Sleep apnea with use of continuous positive airway pressure (CPAP) 10/08/2016  . Chronic combined systolic and diastolic heart failure ([place]) 10/08/2016  . Generalized OA 10/08/2016  . Foot drop- R 10/08/2016  . Lumbar spinal stenosis 10/08/2016  . Pulmonary hypertension ([place]) 10/08/2016  . Chronic atrial  fibrillation ([place]) 10/08/2016  . Chronic anticoagulation 10/08/2016  . Obesity, Class III, BMI 40-49.9 (morbid obesity) ([place]) 10/08/2016    Expected Discharge Date: Expected Discharge Date: 11/17/19  Team Members Present: Physician leading conference: Dr. Courtney Heys Social Worker Present: Lennart Pall, LCSW Nurse Present: Other (comment)(Inka Jamison Oka, LPN) Case Manager: Karene Fry, RN PT Present: Apolinar Junes, PT OT Present: Elisabeth Most, OT SLP Present: Charolett Bumpers, SLP PPS Coordinator present : Ileana Ladd, Burna Mortimer, SLP     Current Status/Progress Goal Weekly Team Focus  Bowel/Bladder   Continent  Maintain normal pattern  Assess QS/PRN   Swallow/Nutrition/ Hydration             ADL's   CS / min A for self care, CGA/CS for functional transfers and ambulation with RW  CS for mobility, incidental assist for adl (wife was assisting prior for compression stockings)  family education, adl training   Mobility   Supervision for bed mobility, transfers, CGA-supervision gait up to 150' RW, CGA 4 steps B rails  Supervision to mod I overall.  Gait w/ improved gait cycle right LE for toe clearance, stairs, strengthening, car transfers, d/c planning, patient/caregiver education.   Communication             Safety/Cognition/ Behavioral Observations            Pain   right leg pain -Oxycodone IR  , 3  QS/PRN assessment   Skin   Right leg  gauze/kerlix  dressing  Prevent infection  Assess QS/PRN    Rehab Goals Patient on target to meet rehab goals: Yes *See  Care Plan and progress notes for long and short-term goals.     Barriers to Discharge  Current Status/Progress Possible Resolutions Date Resolved   Nursing                  PT  Home environment access/layout  4 STE front door with B rails, 2 STE garage without rails  CGA on stairs, need family training on steps           OT                  SLP                SW                Discharge  Planning/Teaching Needs:  Pt to d/c home with wife who can provide 24/7 assistance.  Teaching needs TBD   Team Discussion: Cellulitis R LE, I+D, 14 days abx, wounds better, sleep issues, CPAP.  RN slept better last night.  OT needs help R foot shoe, wife helps, met goals S level.  PT mod I transfers, CGA 4 steps, goal level.  Needs f/u for wound care.   Revisions to Treatment Plan: N/A     Medical Summary Current Status: no complaints of pain; LBM 1/11; sleeping- last night was best; Weekly Focus/Goal: OT- only needs helps with vlcro shoe with R foot; wife can help- also helps with TEDs; met Supervision goals  Barriers to Discharge: Wound care;Weight;Decreased family/caregiver support;Other (comments)  Barriers to Discharge Comments: wound care of RLE cellultis Possible Resolutions to Barriers: mod I to supervision for transfers;150 ft RW- slow pace; 4 [place] CGA with 2 rails;- ready for d/c.   Continued Need for Acute Rehabilitation Level of Care: The patient requires daily medical management by a physician with specialized training in physical medicine and rehabilitation for the following reasons: Direction of a multidisciplinary physical rehabilitation program to maximize functional independence : Yes Medical management of patient stability for increased activity during participation in an intensive rehabilitation regime.: Yes Analysis of laboratory values and/or radiology reports with any subsequent need for medication adjustment and/or medical intervention. : Yes   I attest that I was present, lead the team conference, and concur with the assessment and plan of the team.   Retta Diones 11/16/2019, 9:06 PM   Team conference was held via web/ teleconference due to [place] - 19

## 2019-11-16 NOTE — Plan of Care (Signed)
  Problem: Consults Goal: RH GENERAL PATIENT EDUCATION Description: See Patient Education module for education specifics. 11/16/2019 1953 by Amanda Cockayne, LPN Outcome: Progressing 11/16/2019 1953 by Amanda Cockayne, LPN Reactivated   Problem: RH SKIN INTEGRITY Goal: RH STG SKIN FREE OF INFECTION/BREAKDOWN Description: No new breakdown with min assist  11/16/2019 1953 by Amanda Cockayne, LPN Outcome: Progressing 11/16/2019 1953 by Amanda Cockayne, LPN Reactivated Goal: RH STG MAINTAIN SKIN INTEGRITY WITH ASSISTANCE Description: STG Maintain Skin Integrity With [place]. 11/16/2019 1953 by Amanda Cockayne, LPN Outcome: Progressing 11/16/2019 1953 by Amanda Cockayne, LPN Reactivated Goal: RH STG ABLE TO PERFORM INCISION/WOUND CARE W/ASSISTANCE Description: STG Able To Perform Incision/Wound Care With [facility]. 11/16/2019 1953 by Amanda Cockayne, LPN Outcome: Progressing 11/16/2019 1953 by Amanda Cockayne, LPN Reactivated   Problem: RH PAIN MANAGEMENT Goal: RH STG PAIN MANAGED AT OR BELOW PT'S PAIN GOAL Description: < 3 out of 10.  11/16/2019 1953 by Amanda Cockayne, LPN Outcome: Progressing 11/16/2019 1953 by Amanda Cockayne, LPN Reactivated

## 2019-11-16 NOTE — Progress Notes (Signed)
Occupational Therapy Discharge Summary  Patient Details  Name: Joel Dominguez MRN: [id number] Date of Birth: Jan 12, 1951  Today's Date: 11/16/2019 OT Individual Time: 1000-1100 OT Individual Time Calculation (min): 60 min   Patient alert and ready for therapy session.  Completed discharge reassessment, shower and mobility as documented below, reviewed energy conservation and safety suggestions with patient and wife, they demonstrate a good understanding and readiness for discharge to home tomorrow.   Patient has met 8 of 8 long term goals due to improved activity tolerance, improved balance, ability to compensate for deficits and improved coordination.  Patient to discharge at overall Modified Independent level.  Patient's care partner is independent to provide the necessary physical assistance at discharge.    Reasons goals not met: na  Recommendation:  Patient will benefit from ongoing skilled OT services in no fu OT services needed at this time to continue to advance functional skills in the area of na.  Equipment: No equipment provided  Patient has comfort height toilets with grab bars and shower bench with grab bars at home setting  Reasons for discharge: treatment goals met  Patient/family agrees with progress made and goals achieved: Yes  OT Discharge Precautions/Restrictions  Precautions Precautions: Fall Precaution Comments: Post op shoe on R LE, DF wrap for foot drop on R Other Brace: Has right AFO PTA, unable to don today d/t edema/dessing.  Post-op shoe available for use Restrictions Weight Bearing Restrictions: No General   Vital Signs   Pain Pain Assessment Pain Scale: 0-10 Pain Score: 0-No pain ADL ADL Eating: Independent Grooming: Independent Upper Body Bathing: Independent Where Assessed-Upper Body Bathing: Shower Lower Body Bathing: Modified independent Where Assessed-Lower Body Bathing: Shower Upper Body Dressing: Modified independent (Device) Where  Assessed-Upper Body Dressing: Chair Lower Body Dressing: Modified independent Where Assessed-Lower Body Dressing: Chair Toileting: Modified independent Where Assessed-Toileting: Glass blower/designer: Modified Programmer, applications Method: Counselling psychologist: Energy manager: Close supervision Social research officer, government Method: Heritage manager: Radio broadcast assistant ADL Comments: requires assist for right darco shoe and sock due to over dressing, wife able to assist Vision Baseline Vision/History: No visual deficits Wears Glasses: At all times Vision Assessment?: No apparent visual deficits Perception  Perception: Within Functional Limits Praxis Praxis: Intact Cognition Overall Cognitive Status: Within Functional Limits for tasks assessed Arousal/Alertness: Awake/alert Orientation Level: Oriented X4 Sensation Coordination Fine Motor Movements are Fluid and Coordinated: Yes Finger Nose Finger Test: [facility] Motor  Motor Motor - Discharge Observations: Improved R LE strength and activity tolerance with all functional mobility., increased time and attention when walking without dorsi assist Mobility  Bed Mobility Supine to Sit: Independent Sit to Supine: Independent Transfers Sit to Stand: Independent with assistive device Stand to Sit: Independent with assistive device  Trunk/Postural Assessment  Postural Control Postural Control: Within Functional Limits  Balance Static Standing Balance Static Standing - Level of Assistance: 7: Independent Dynamic Standing Balance Dynamic Standing - Level of Assistance: 6: Modified independent (Device/Increase time) Extremity/Trunk Assessment RUE Assessment RUE Assessment: Within Functional Limits LUE Assessment LUE Assessment: Within Functional Limits   Joel Dominguez 11/16/2019, 1:48 PM

## 2019-11-16 NOTE — Progress Notes (Addendum)
Physical Therapy Session Note  Patient Details  Name: Joel Dominguez MRN: [id number] Date of Birth: 1950-12-24  Today's Date: 11/16/2019 PT Individual Time: 1303-1400 PT Individual Time Calculation (min): 57 min   Short Term Goals: Week 1:  PT Short Term Goal 1 (Week 1): equals LTG d/t LOS  Skilled Therapeutic Interventions/Progress Updates:    Session focused on family education with pt and pt's wife in preparation for d/c tomorrow. Pt performs functional transfers with RW throughout session modified independent. Pt's wife able to perform DF wrap on RLE for improved clearance of RLE due to AFO not able to be worn at this time. Discussed home set-up and recommendations. Stair negotiation training up/down 4 steps at supervision level and discussed home set-up options due to no rails but able to hold on with both UE's to doorframe and/or freezer/shelf for support. Pt reports this is how he has been doing it for a long time and sees no issue compared to his current level of function. Demonstrated technique with RW backwards but due to only 2 steps anticipate that patient will be succesful with his technique at home and demonstrated minimal reliance on UE's just for support. Educated on wife on proper positioning for guarding. Simulated car transfer to SUV height with supervision for transfer and pt's wife assisted with RLE into the car and no assist on exiting (frame sits higher than real car) and she reports that sometimes she would help him PTA. Gait over ramp and mulched surface including up/down curb step for community mobility training at supervision level. Pt issued and reviewed HEP for continued strengthening and ROM/endurance including the following below:   Access Code: [email or web address]  URL: [email or web address]  Date: 11/16/2019  Prepared by: Canary Brim   Exercises Mini Squat with Counter Support - 10 reps - 3 sets - 1x daily - 7x weekly Standing Hip Abduction with Counter  Support - 10 reps - 3 sets - 1x daily - 7x weekly Standing Hamstring Curl with Chair Support - 10 reps - 3 sets - 1x daily - 7x weekly Seated Long Arc Quad - 10 reps - 3 sets - 1x daily - 7x weekly Seated March - 10 reps - 3 sets - 1x daily - 7x weekly Seated Hamstring Stretch - 3 reps - 3 sets - 30 hold - 1x daily - 7x weekly Seated Calf Stretch with Strap - 3 reps - 3 sets - 30 hold - 1x daily - 7x weekly Standing Gastroc Stretch at Counter - 3 reps - 3 sets - 30 hold - 1x daily - 7x weekly  Pt made modified independent in the room with RW.   Therapy Documentation Precautions:  Precautions Precautions: Fall Precaution Comments: Post op shoe on R LE, DF wrap for foot drop on R Required Braces or Orthoses: Other Brace Other Brace: Has right AFO PTA, unable to don today d/t edema/dessing.  Post-op shoe available for use Restrictions Weight Bearing Restrictions: No   Pain: Denies pain.    Therapy/Group: Individual Therapy  Canary Brim Ivory Broad, PT, DPT, CBIS  11/16/2019, 2:21 PM

## 2019-11-16 NOTE — Progress Notes (Signed)
Joel Dominguez PHYSICAL MEDICINE & REHABILITATION PROGRESS NOTE   Subjective/Complaints:   Plans to d/c home tomorrow, after talking with PT-  Has no issues this AM except sleep interupted due to someone screaming.   ROS- pt denies SOB, CP, abd pain, HA, vision change, N/V/D/constipation Objective:   No results found. Recent Labs    11/15/19 0559  WBC 7.4  HGB 8.1*  HCT 26.0*  PLT 386   Recent Labs    11/15/19 0559  NA 137  K 3.8  CL 101  CO2 29  GLUCOSE 106*  BUN 12  CREATININE 1.08  CALCIUM 8.3*    Intake/Output Summary (Last 24 hours) at 11/16/2019 0909 Last data filed at 11/16/2019 0855 Gross per 24 hour  Intake 420 ml  Output 1050 ml  Net -630 ml     Physical Exam: Vital Signs Blood pressure 138/72, pulse 90, temperature 98 F (36.7 C), temperature source Oral, resp. rate 18, weight (!) 146.4 kg, SpO2 97 %.  Physical Exam  Nursing note and vitals reviewed.  Constitutional:.  Pt awake, sitting up ; finished breakfast standing/at EOB pulling up pants; PT I room;, NAD HENT:  Head: Normocephalic and atraumatic.  Mouth/Throat: No oropharyngeal exudate.  Eyes:  No scleral icterus.  Neck: No tracheal deviation present.  Cardiovascular:  RRR Respiratory:  CTA B/L- no W/R/R  GI:  Soft, protuberant, NT, ND, (+)BS  Musculoskeletal:  General: Edema present.  Cervical back: Normal range of motion and neck supple.  Comments: Well healed old B-TKR incisions.  UEs 5/5 in delt/biceps/triceps/WE/grip/finger abd LEs- RLE_ HF 2-/5, KE 2/5, DF 0/5, PF 2-/5, EHL 0/5 LLE- 5-/5 in all same muscles tested in RLE  Neurological: Ox3 Sensation is intact in all 4 extremities to LT B/L  Skin: Skin is warm and dry.  Dry flaky skin on right shin and bilateral feet. Dry callus on R-3rd toe with underlying dried blood.  Open incision R inner distal thigh- ~ >1 cm- dried blood surrounding opening Less erythema and flaking skin in pictures of RLE shown from weekend HAS backside  breakdown- unable to assess today Bruising all over UEs Psychiatric: Appropriate affect     Assessment/Plan: 1. Functional deficits secondary to debility due to RLE cellulitis/weakness and s/p I&D s/p Linezolid which require 3+ hours per day of interdisciplinary therapy in a comprehensive inpatient rehab setting.  Physiatrist is providing close team supervision and 24 hour management of active medical problems listed below.  Physiatrist and rehab team continue to assess barriers to discharge/monitor patient progress toward functional and medical goals  Care Tool:  Bathing    Body parts bathed by patient: Right arm, Left arm, Chest, Abdomen, Front perineal area, Buttocks, Right upper leg, Left upper leg, Right lower leg, Left lower leg   Body parts bathed by helper: Right lower leg, Left lower leg     Bathing assist Assist Level: Set up assist     Upper Body Dressing/Undressing Upper body dressing   What is the patient wearing?: Pull over shirt    Upper body assist Assist Level: Set up assist    Lower Body Dressing/Undressing Lower body dressing      What is the patient wearing?: Underwear/pull up, Pants     Lower body assist Assist for lower body dressing: Contact Guard/Touching assist     Toileting Toileting    Toileting assist Assist for toileting: Independent with assistive device Assistive Device Comment: urinal   Transfers Chair/bed transfer  Transfers assist  Chair/bed transfer assist level: Supervision/Verbal cueing Chair/bed transfer assistive device: Museum/gallery exhibitions officer assist      Assist level: Contact Guard/Touching assist Assistive device: Walker-rolling Max distance: 150'   Walk 10 feet activity   Assist     Assist level: Supervision/Verbal cueing Assistive device: Walker-rolling   Walk 50 feet activity   Assist    Assist level: Contact Guard/Touching assist Assistive device: Walker-rolling     Walk 150 feet activity   Assist Walk 150 feet activity did not occur: Refused  Assist level: Contact Guard/Touching assist Assistive device: Walker-rolling    Walk 10 feet on uneven surface  activity   Assist Walk 10 feet on uneven surfaces activity did not occur: Safety/medical concerns         Wheelchair     Assist Will patient use wheelchair at discharge?: No Type of Wheelchair: Manual    Wheelchair assist level: Contact Guard/Touching assist Max wheelchair distance: 23'    Wheelchair 50 feet with 2 turns activity    Assist    Wheelchair 50 feet with 2 turns activity did not occur: Safety/medical concerns   Assist Level: Contact Guard/Touching assist   Wheelchair 150 feet activity     Assist          Blood pressure 138/72, pulse 90, temperature 98 F (36.7 C), temperature source Oral, resp. rate 18, weight (!) 146.4 kg, SpO2 97 %.  Medical Problem List and Plan:  1. Impaired ADLs/mobility and function secondary to debility due to cellulitis of RLE/s/p I&D s/p Linezolid x 14 days; and LE weakness  -patient may shower  -ELOS/Goals: 7-10 days; Mod I  CIR PT, OT  1.12- d/c tomorrow- doing well  2. Antithrombotics:  -DVT/anticoagulation: Pharmaceutical: Other (comment)--Eliquis  -antiplatelet therapy: N/A  3. Pain Management: Continue oxycodone prn. Will add flexeril prn for spasms.  4. Mood: LCSW to follow for evaluation and support.  -antipsychotic agents: N/A  5. Neuropsych: This patient is capable of making decisions on his own behalf.  6. Skin/Wound Care: Air mattress overlay for DTI on sacrum. Resume soaks to callus on right 3rd toe. Add protein supplement to help promote wound healing. Will likely need dressing changes to RLE- will discuss with previous team  7. Fluids/Electrolytes/Nutrition: Monitor I/O. BUN/Creat nl 1/8 d/c IV          8. Necrotizing cellulitis: Leucocytosis has resolved and thrombocytosis resolving. Continue daily  dressing changes; off Linezolid after 14 days   1/8- will add MVI with Vit C per Plastics 9. A fib/few beats NSVT: Monitor HR tid. On Eliquis and coreg bid- Coreg was been reduced/dose due to orthostasis. Continue to monitor for orthostatic symptoms.  10. Chronic diastolic CHF: Compensated. Bumex resumed at 1mg  on 01/06. Aldactone and telmisartan on hold--monitor weight daily and for any signs of overload. On Coreg bid.  1/8- no weights yet- will follow   Filed Weights   11/14/19 0550 11/15/19 0700  Weight: (!) 146 kg (!) 146.4 kg    1/12- weight stable  11. OSA: Compliant with BiPAP use.  12. Morbid obesity: BMI 44--educate on importance of weight loss to help promote health and mobility.  13. ABLA: Continue to trend H/H. Will order stool guaiacs. Last hgb 8.6  1/11- fecal occult (+)- Hb down to 8.1- will need f/u outpatient.  14. Acute renal failure: Has resolved. Will monitor to make sure no additional issues.   1/8- labs great 15. RLE foot drop- has R AFO but  cannot use due to wounds on RLE- will d/w PT for gait.  16. RLE muscle spasms- do Flexeril as needed.   1/12- helping    LOS: 5 days A FACE TO FACE EVALUATION WAS PERFORMED  Mersades Barbaro 11/16/2019, 9:09 AM

## 2019-11-16 NOTE — Progress Notes (Signed)
Patient has home CPAP at bedside with water in water chamber. Pt will place on self when ready.

## 2019-11-17 DIAGNOSIS — R195 Other fecal abnormalities: Secondary | ICD-10-CM

## 2019-11-17 MED ORDER — B COMPLEX-C PO TABS: 1.0000 | ORAL_TABLET | Freq: Every day | ORAL | 0 refills | Status: AC

## 2019-11-17 MED ORDER — CARVEDILOL 12.5 MG PO TABS: 12.5000 mg | ORAL_TABLET | Freq: Two times a day (BID) | ORAL | 0 refills | Status: DC

## 2019-11-17 MED ORDER — OXYCODONE HCL 10 MG PO TABS: 5.0000 mg | ORAL_TABLET | Freq: Three times a day (TID) | ORAL | 0 refills | Status: DC | PRN

## 2019-11-17 MED ORDER — BUMETANIDE 1 MG PO TABS: 1.0000 mg | ORAL_TABLET | Freq: Every day | ORAL | 0 refills | Status: DC

## 2019-11-17 MED ORDER — OXYCODONE HCL 5 MG PO TABS
5.0000 mg | ORAL_TABLET | Freq: Four times a day (QID) | ORAL | Status: DC | PRN
  Administered 2019-11-17: 10 mg via ORAL
  Filled 2019-11-17: qty 2

## 2019-11-17 MED ORDER — CYCLOBENZAPRINE HCL 5 MG PO TABS: 5.0000 mg | ORAL_TABLET | Freq: Three times a day (TID) | ORAL | 0 refills | Status: DC | PRN

## 2019-11-17 MED ORDER — HYDROCERIN EX CREA: 1.0000 "application " | TOPICAL_CREAM | Freq: Two times a day (BID) | CUTANEOUS | 0 refills | Status: DC

## 2019-11-17 MED ORDER — SENNOSIDES-DOCUSATE SODIUM 8.6-50 MG PO TABS: 1.0000 | ORAL_TABLET | Freq: Two times a day (BID) | ORAL | 0 refills | Status: DC

## 2019-11-17 NOTE — Progress Notes (Signed)
Patient and his wife were educated and demonstrated dressing change for buttock MASD. Provider discussed discharge instructions with the patient and all questions/concerns were addressed. Patient discharged home with his wife.

## 2019-11-17 NOTE — Progress Notes (Signed)
[facility] PHYSICAL MEDICINE & REHABILITATION PROGRESS NOTE   Subjective/Complaints:   D/c home today is plan.  Pt asking about wound care- will be getting homehealth; and Plastics will see him in f/u.   ROS- pt denies SOB, CP, abd pain, HA, vision change, N/V/D/constipation Objective:   No results found. Recent Labs    11/15/19 0559  WBC 7.4  HGB 8.1*  HCT 26.0*  PLT 386   Recent Labs    11/15/19 0559  NA 137  K 3.8  CL 101  CO2 29  GLUCOSE 106*  BUN 12  CREATININE 1.08  CALCIUM 8.3*    Intake/Output Summary (Last 24 hours) at 11/17/2019 0831 Last data filed at 11/17/2019 0754 Gross per 24 hour  Intake 720 ml  Output 200 ml  Net 520 ml     Physical Exam: Vital Signs Blood pressure (!) 166/71, pulse 72, temperature 98.2 F (36.8 C), temperature source Oral, resp. rate 17, weight (!) 145.3 kg, SpO2 98 %.  Physical Exam  Nursing note and vitals reviewed.  Constitutional:.  Pt awake, sitting up ; finished breakfast standing/at EOB pulling up pants; PT I room;, NAD HENT:  Head: Normocephalic and atraumatic.  Mouth/Throat: No oropharyngeal exudate.  Eyes:  No scleral icterus.  Neck: No tracheal deviation present.  Cardiovascular:  RRR Respiratory:  CTA B/L- no W/R/R  GI:  Soft, protuberant, NT, ND, (+)BS  Musculoskeletal:  General: Edema present.  Cervical back: Normal range of motion and neck supple.  Comments: Well healed old B-TKR incisions.  UEs 5/5 in delt/biceps/triceps/WE/grip/finger abd LEs- RLE_ HF 2-/5, KE 2/5, DF 0/5, PF 2-/5, EHL 0/5 LLE- 5-/5 in all same muscles tested in RLE  Neurological: Ox3 Sensation is intact in all 4 extremities to LT B/L  Skin: Skin is warm and dry.  Dry flaky skin on right shin and bilateral feet. Dry callus on R-3rd toe with underlying dried blood.  Open incision R inner distal thigh- ~ >1 cm- dried blood surrounding opening Less erythema and flaking skin in pictures of RLE shown from weekend HAS backside  breakdown- unable to assess today Bruising all over UEs Psychiatric: Appropriate affect     Assessment/Plan: 1. Functional deficits secondary to debility due to RLE cellulitis/weakness and s/p I&D s/p Linezolid which require 3+ hours per day of interdisciplinary therapy in a comprehensive inpatient rehab setting.  Physiatrist is providing close team supervision and 24 hour management of active medical problems listed below.  Physiatrist and rehab team continue to assess barriers to discharge/monitor patient progress toward functional and medical goals  Care Tool:  Bathing    Body parts bathed by patient: Right arm, Left arm, Chest, Abdomen, Front perineal area, Buttocks, Right upper leg, Left upper leg, Right lower leg, Left lower leg, Face   Body parts bathed by helper: Right lower leg, Left lower leg     Bathing assist Assist Level: Independent with assistive device     Upper Body Dressing/Undressing Upper body dressing   What is the patient wearing?: Pull over shirt    Upper body assist Assist Level: Independent with assistive device    Lower Body Dressing/Undressing Lower body dressing      What is the patient wearing?: Underwear/pull up, Pants     Lower body assist Assist for lower body dressing: Independent with assitive device     Toileting Toileting    Toileting assist Assist for toileting: Independent with assistive device Assistive Device Comment: urinal   Transfers Chair/bed transfer  Transfers  assist     Chair/bed transfer assist level: Independent with assistive device Chair/bed transfer assistive device: Programmer, multimedia   Ambulation assist      Assist level: Supervision/Verbal cueing Assistive device: Walker-rolling(R post op shoe with DF wrap) Max distance: 155'   Walk 10 feet activity   Assist     Assist level: Independent with assistive device Assistive device: Walker-rolling(R post op shoe with DF wrap)    Walk 50 feet activity   Assist    Assist level: Independent with assistive device Assistive device: Walker-rolling(R post op shoe with DF wrap)    Walk 150 feet activity   Assist Walk 150 feet activity did not occur: Refused  Assist level: Supervision/Verbal cueing Assistive device: Walker-rolling(R post op shoe with DF wrap)    Walk 10 feet on uneven surface  activity   Assist Walk 10 feet on uneven surfaces activity did not occur: Safety/medical concerns   Assist level: Supervision/Verbal cueing Assistive device: Walker-rolling(R post op shoe with DF wrap)   Wheelchair     Assist Will patient use wheelchair at discharge?: No(Patient is a functional ambulator) Type of Wheelchair: Manual Wheelchair activity did not occur: N/A  Wheelchair assist level: Contact Guard/Touching assist Max wheelchair distance: 6'    Wheelchair 50 feet with 2 turns activity    Assist    Wheelchair 50 feet with 2 turns activity did not occur: N/A   Assist Level: Contact Guard/Touching assist   Wheelchair 150 feet activity     Assist  Wheelchair 150 feet activity did not occur: N/A       Blood pressure (!) 166/71, pulse 72, temperature 98.2 F (36.8 C), temperature source Oral, resp. rate 17, weight (!) 145.3 kg, SpO2 98 %.  Medical Problem List and Plan:  1. Impaired ADLs/mobility and function secondary to debility due to cellulitis of RLE/s/p I&D s/p Linezolid x 14 days; and LE weakness  -patient may shower  -ELOS/Goals: 7-10 days; Mod I  CIR PT, OT  1.12- d/c tomorrow- doing well  2. Antithrombotics:  -DVT/anticoagulation: Pharmaceutical: Other (comment)--Eliquis  -antiplatelet therapy: N/A  3. Pain Management: Continue oxycodone prn. Will add flexeril prn for spasms.  4. Mood: LCSW to follow for evaluation and support.  -antipsychotic agents: N/A  5. Neuropsych: This patient is capable of making decisions on his own behalf.  6. Skin/Wound Care: Air  mattress overlay for DTI on sacrum. Resume soaks to callus on right 3rd toe. Add protein supplement to help promote wound healing. Will likely need dressing changes to RLE- will discuss with previous team  7. Fluids/Electrolytes/Nutrition: Monitor I/O. BUN/Creat nl 1/8 d/c IV          8. Necrotizing cellulitis: Leucocytosis has resolved and thrombocytosis resolving. Continue daily dressing changes; off Linezolid after 14 days   1/8- will add MVI with Vit C per Plastics 9. A fib/few beats NSVT: Monitor HR tid. On Eliquis and coreg bid- Coreg was been reduced/dose due to orthostasis. Continue to monitor for orthostatic symptoms.  10. Chronic diastolic CHF: Compensated. Bumex resumed at 1mg  on 01/06. Aldactone and telmisartan on hold--monitor weight daily and for any signs of overload. On Coreg bid.  1/8- no weights yet- will follow   Filed Weights   11/14/19 0550 11/15/19 0700 11/17/19 0605  Weight: (!) 146 kg (!) 146.4 kg (!) 145.3 kg    1/12- weight stable  11. OSA: Compliant with BiPAP use.  12. Morbid obesity: BMI 44--educate on importance of weight loss to  help promote health and mobility.  13. ABLA: Continue to trend H/H. Will order stool guaiacs. Last hgb 8.6  1/11- fecal occult (+)- Hb down to 8.1- will need f/u outpatient.  1/13- will need to let pt know.   14. Acute renal failure: Has resolved. Will monitor to make sure no additional issues.   1/8- labs great 15. RLE foot drop- has R AFO but cannot use due to wounds on RLE- will d/w PT for gait.  16. RLE muscle spasms- do Flexeril as needed.   1/12- helping    LOS: 6 days A FACE TO FACE EVALUATION WAS PERFORMED  Joel Dominguez 11/17/2019, 8:31 AM

## 2019-11-17 NOTE — Discharge Summary (Signed)
Physician Discharge Summary  Patient ID: Joel Dominguez MRN: [id number] DOB/AGE: 1951/07/12 69 y.o.  Admit date: 11/11/2019 Discharge date: 11/17/2019  Discharge Diagnoses:  Principal Problem:   Physical debility Active Problems:   Sleep apnea with use of continuous positive airway pressure (CPAP)   Chronic combined systolic and diastolic heart failure (HCC)   Foot drop- R   Pulmonary hypertension (HCC)   Chronic atrial fibrillation (HCC)   Chronic anticoagulation   Obesity, Class III, BMI 40-49.9 (morbid obesity) (HCC)   Lumbosacral radiculopathy   Heme positive stool   Discharged Condition: stable   Significant Diagnostic Studies: N/A   Labs:  Basic Metabolic Panel: Recent Labs  Lab 11/11/19 0510 11/12/19 0531 11/15/19 0559  NA 137 137 137  K 4.1 4.0 3.8  CL 100 99 101  CO2 32 28 29  GLUCOSE 117* 113* 106*  BUN 15 12 12   CREATININE 1.15 1.05 1.08  CALCIUM 8.7* 8.6* 8.3*    CBC: Recent Labs  Lab 11/11/19 0510 11/12/19 0531 11/15/19 0559  WBC 8.7 7.6 7.4  NEUTROABS  --  5.0  --   HGB 7.9* 8.6* 8.1*  HCT 25.7* 27.0* 26.0*  MCV 94.1 93.1 92.2  PLT 513* 470* 386    CBG: No results for input(s): GLUCAP in the last 168 hours.  Brief HPI:   Joel Dominguez is a 69 y.o. male with history of chronic A. fib, mild aortic stenosis, CHF, lumbar stenosis with right foot drop, chronic right third toe ulcer/callus was admitted on 10/24/2019 with increasing edema, pain and blisters with cellulitis on right lower extremity.  He was started on IV Vanco DX elected to IV ampicillin.  He continued to have persistent leukocytosis with progressive cellulitis and p.o. Zyvox was added to broaden coverage and Bumex increased to help with fluid overload.  Dr. Mardelle Matte was consulted for input on necrotizing cellulitis and patient was taken to the OR on 2023/11/20 for I&D with removal of dead tissue distal medial leg, distal lateral ankle and distal medial thigh.  No purulence noted.  He  completed 14-day course of linezolid and wounds healing with resolution of leukocytosis.  Hospital course significant for  ABLA due to bleeding from RLE as well as few beats NSVT therefore Coreg titrated upwards to 12.5 mg bid.   He was showing improvement in activity tolerance but continued to be debilitated.  CIR was recommended due to functional decline   Hospital Course: Letha Lundie was admitted to rehab 11/11/2019 for inpatient therapies to consist of PTT and OT at least three hours five days a week. Past admission physiatrist, therapy team and rehab RN have worked together to provide customized collaborative inpatient rehab.  Pain regular extremity has been managed with as needed use of oxycodone.  He was educated on weaning off of this over the next week and transitioning to his home dose of hydrocodone.  His blood pressures were monitored on TID basis and and has been stable.  Follow-up CBC shows H&H to be relatively stable however he did have one heme-positive stool.  Home health RN to draw CBC next week with results to PCP.  Patient and family advised to monitor for any signs of bleeding is on Eliquis.  He was maintained on heart healthy diet and has been advised regarding monitoring carbohydrates due to his history of prediabetes.  Right lower extremity wounds are healing well however he does continue have minimal serous drainage from right upper thigh and right posterior calf wound.  Medisorb added  to help absorb drainage with dry dressings which are to be changed daily. Protein supplement added to help promote healing. . Lower extremity edema has been managed with compression and elevation.  Weights are stable at 145 kg.  He is compliant with CPAP use. He has made gains during rehab stay and is at modified independent level. He will continue to receive follow up HHPT and [place] by [facility]  after discharge   Rehab course: During patient's stay in rehab weekly team conferences were  held to monitor patient's progress, set goals and discuss barriers to discharge. At admission, patient required min assist with ADLs and with mobility. He  has had improvement in activity tolerance, balance, postural control as well as ability to compensate for deficits.  He is able to complete ADL tasks at modified independent level.  He is modified independent for transfers and is able to ambulate 150 feet with rolling walker and supervision.  He is able to ambulate household distances at modified independent level.  Family education has been completed with wife regarding all aspects of safety and care   Discharge disposition: 01-Home or Self Care  Diet: Heart healthy  Special Instructions: 1.  Apply Eucerin to dry flaky skin on right lower extremity.  To open area right thigh and right posterior calf apply a small piece of medisorb and cover with dry dressing.  Change daily. 2.  Place Aquacel between gluteal cleft to help with MASD and change daily. 3.  Will need follow-up stool guaiacs due to episode of heme positive stool.  Home health RN to recheck CBC on 1/19 with results to PCP. 4.  Keep legs elevated for edema control.   Allergies as of 11/17/2019      Reactions   Cephalexin Rash      Medication List    STOP taking these medications   docusate sodium 100 MG capsule Commonly known as: COLACE   HYDROcodone-acetaminophen 10-325 MG tablet Commonly known as: NORCO   potassium chloride 10 MEQ tablet Commonly known as: KLOR-CON   senna 8.6 MG Tabs tablet Commonly known as: SENOKOT   spironolactone 25 MG tablet Commonly known as: ALDACTONE     TAKE these medications   apixaban 5 MG Tabs tablet Commonly known as: Eliquis Take 1 tablet (5 mg total) by mouth 2 (two) times daily.   B-complex with vitamin C tablet Take 1 tablet by mouth daily.   bumetanide 1 MG tablet Commonly known as: BUMEX Take 1 tablet (1 mg total) by mouth daily.   carvedilol 12.5 MG tablet Commonly  known as: COREG Take 1 tablet (12.5 mg total) by mouth 2 (two) times daily with a meal.   cyclobenzaprine 5 MG tablet Commonly known as: FLEXERIL Take 1 tablet (5 mg total) by mouth 3 (three) times daily as needed for muscle spasms.   hydrocerin Crea Apply 1 application topically 2 (two) times daily. Notes to patient: OTC   multivitamin tablet Take 1 tablet by mouth daily.   Oxycodone HCl 10 MG Tabs--Rx# 21 pills Take 0.5-1 tablets (5-10 mg total) by mouth 3 (three) times daily as needed for severe pain. Notes to patient: Once this is used up you can transition back to hydrocodone that you have at home. Do not use together   senna-docusate 8.6-50 MG tablet Commonly known as: Senokot-S Take 1 tablet by mouth 2 (two) times daily. Notes to patient: OTC   Vitamin D (Cholecalciferol) 50 MCG (2000 UT) Caps Take 50 mcg by mouth  daily.   Vitamin D (Ergocalciferol) 1.25 MG (50000 UNIT) Caps capsule Commonly known as: DRISDOL TAKE 1 CAPSULE BY MOUTH EVERY 7 DAYS. What changed: See the new instructions.      Follow-up Information    Mellody Dance, DO Follow up on 11/18/2019.   Specialty: Family Medicine Why: for post hospital follow up in 1-2 weeks Contact information: [place] [address] [phone]        Richardo Priest, MD. Call on 11/18/2019.   Specialty: Cardiology Why: for follow up Contact information: [place] [place] [address] [phone]        Marchia Bond, MD. Call.   Specialty: Orthopedic Surgery Why: for post op check Contact information: [address] [place] [place] [address] [phone]           Signed: Bary Leriche 11/17/2019, 4:09 PM

## 2019-11-17 NOTE — Progress Notes (Signed)
Social Work Discharge Note   The overall goal for the admission was met for:   Discharge location: Yes - home with wife who can provide 24/7 assistance  Length of Stay: Yes - 6 days  Discharge activity level: Yes - supervision/ mod independent  Home/community participation: Yes  Services provided included: MD, RD, PT, OT, RN, Pharmacy and [place]: Medicare  Follow-up services arranged: Home Health: RN, PT via [facility], DME: bariatric rolling walker via [facility] and Patient/Family has no preference for HH/DME agencies  Comments (or additional information):   contact info - pt @ [phone] or wife, Altha Harm @ [phone]  Patient/Family verbalized understanding of follow-up arrangements: Yes  Individual responsible for coordination of the follow-up plan: pt  Confirmed correct DME delivered: Lennart Pall 11/17/2019    Trong Gosling

## 2019-11-18 DIAGNOSIS — I110 Hypertensive heart disease with heart failure: Secondary | ICD-10-CM | POA: Diagnosis not present

## 2019-11-18 DIAGNOSIS — Z6841 Body Mass Index (BMI) 40.0 and over, adult: Secondary | ICD-10-CM | POA: Diagnosis not present

## 2019-11-18 DIAGNOSIS — M21371 Foot drop, right foot: Secondary | ICD-10-CM | POA: Diagnosis not present

## 2019-11-18 DIAGNOSIS — L304 Erythema intertrigo: Secondary | ICD-10-CM | POA: Diagnosis not present

## 2019-11-18 DIAGNOSIS — I482 Chronic atrial fibrillation, unspecified: Secondary | ICD-10-CM | POA: Diagnosis not present

## 2019-11-18 DIAGNOSIS — I5042 Chronic combined systolic (congestive) and diastolic (congestive) heart failure: Secondary | ICD-10-CM | POA: Diagnosis not present

## 2019-11-18 DIAGNOSIS — R2689 Other abnormalities of gait and mobility: Secondary | ICD-10-CM | POA: Diagnosis not present

## 2019-11-18 DIAGNOSIS — Z48817 Encounter for surgical aftercare following surgery on the skin and subcutaneous tissue: Secondary | ICD-10-CM | POA: Diagnosis not present

## 2019-11-18 DIAGNOSIS — L03115 Cellulitis of right lower limb: Secondary | ICD-10-CM | POA: Diagnosis not present

## 2019-11-18 DIAGNOSIS — Z4801 Encounter for change or removal of surgical wound dressing: Secondary | ICD-10-CM | POA: Diagnosis not present

## 2019-11-19 ENCOUNTER — Telehealth: Payer: Self-pay | Admitting: Family Medicine

## 2019-11-19 DIAGNOSIS — Z4801 Encounter for change or removal of surgical wound dressing: Secondary | ICD-10-CM | POA: Diagnosis not present

## 2019-11-19 DIAGNOSIS — I5042 Chronic combined systolic (congestive) and diastolic (congestive) heart failure: Secondary | ICD-10-CM | POA: Diagnosis not present

## 2019-11-19 DIAGNOSIS — I110 Hypertensive heart disease with heart failure: Secondary | ICD-10-CM | POA: Diagnosis not present

## 2019-11-19 DIAGNOSIS — Z48817 Encounter for surgical aftercare following surgery on the skin and subcutaneous tissue: Secondary | ICD-10-CM | POA: Diagnosis not present

## 2019-11-19 DIAGNOSIS — L03115 Cellulitis of right lower limb: Secondary | ICD-10-CM | POA: Diagnosis not present

## 2019-11-19 DIAGNOSIS — L304 Erythema intertrigo: Secondary | ICD-10-CM | POA: Diagnosis not present

## 2019-11-19 NOTE — Telephone Encounter (Signed)
Festus Holts @ [facility] [phone] called to notify provider/ Dr Raliegh Scarlet that pt requested nurse visits to begin 11/22/19.   --FYI to med asst.  --glh

## 2019-11-19 NOTE — Telephone Encounter (Signed)
Dorian Pod the RN from [facility] states she already has the order for wound care she just wanted to let us know she was going to see patient today. AS, CMA

## 2019-11-23 DIAGNOSIS — I110 Hypertensive heart disease with heart failure: Secondary | ICD-10-CM | POA: Diagnosis not present

## 2019-11-23 DIAGNOSIS — Z48817 Encounter for surgical aftercare following surgery on the skin and subcutaneous tissue: Secondary | ICD-10-CM | POA: Diagnosis not present

## 2019-11-23 DIAGNOSIS — L03115 Cellulitis of right lower limb: Secondary | ICD-10-CM | POA: Diagnosis not present

## 2019-11-23 DIAGNOSIS — L304 Erythema intertrigo: Secondary | ICD-10-CM | POA: Diagnosis not present

## 2019-11-23 DIAGNOSIS — R6889 Other general symptoms and signs: Secondary | ICD-10-CM | POA: Diagnosis not present

## 2019-11-23 DIAGNOSIS — I5042 Chronic combined systolic (congestive) and diastolic (congestive) heart failure: Secondary | ICD-10-CM | POA: Diagnosis not present

## 2019-11-23 DIAGNOSIS — Z4801 Encounter for change or removal of surgical wound dressing: Secondary | ICD-10-CM | POA: Diagnosis not present

## 2019-11-25 DIAGNOSIS — Z4801 Encounter for change or removal of surgical wound dressing: Secondary | ICD-10-CM | POA: Diagnosis not present

## 2019-11-25 DIAGNOSIS — I110 Hypertensive heart disease with heart failure: Secondary | ICD-10-CM | POA: Diagnosis not present

## 2019-11-25 DIAGNOSIS — L304 Erythema intertrigo: Secondary | ICD-10-CM | POA: Diagnosis not present

## 2019-11-25 DIAGNOSIS — L03115 Cellulitis of right lower limb: Secondary | ICD-10-CM | POA: Diagnosis not present

## 2019-11-25 DIAGNOSIS — Z48817 Encounter for surgical aftercare following surgery on the skin and subcutaneous tissue: Secondary | ICD-10-CM | POA: Diagnosis not present

## 2019-11-25 DIAGNOSIS — I5042 Chronic combined systolic (congestive) and diastolic (congestive) heart failure: Secondary | ICD-10-CM | POA: Diagnosis not present

## 2019-11-30 DIAGNOSIS — L03115 Cellulitis of right lower limb: Secondary | ICD-10-CM | POA: Diagnosis not present

## 2019-11-30 DIAGNOSIS — Z4801 Encounter for change or removal of surgical wound dressing: Secondary | ICD-10-CM | POA: Diagnosis not present

## 2019-11-30 DIAGNOSIS — Z48817 Encounter for surgical aftercare following surgery on the skin and subcutaneous tissue: Secondary | ICD-10-CM | POA: Diagnosis not present

## 2019-11-30 DIAGNOSIS — L304 Erythema intertrigo: Secondary | ICD-10-CM | POA: Diagnosis not present

## 2019-11-30 DIAGNOSIS — I5042 Chronic combined systolic (congestive) and diastolic (congestive) heart failure: Secondary | ICD-10-CM | POA: Diagnosis not present

## 2019-11-30 DIAGNOSIS — I110 Hypertensive heart disease with heart failure: Secondary | ICD-10-CM | POA: Diagnosis not present

## 2019-12-01 ENCOUNTER — Other Ambulatory Visit: Payer: Self-pay

## 2019-12-01 ENCOUNTER — Encounter: Payer: Self-pay | Admitting: Family Medicine

## 2019-12-01 ENCOUNTER — Ambulatory Visit (INDEPENDENT_AMBULATORY_CARE_PROVIDER_SITE_OTHER): Payer: Medicare Other | Admitting: Family Medicine

## 2019-12-01 VITALS — BP 157/82 | HR 91 | Temp 98.2°F | Resp 16 | Ht 72.0 in | Wt 331.2 lb

## 2019-12-01 DIAGNOSIS — D649 Anemia, unspecified: Secondary | ICD-10-CM | POA: Insufficient documentation

## 2019-12-01 DIAGNOSIS — I482 Chronic atrial fibrillation, unspecified: Secondary | ICD-10-CM

## 2019-12-01 DIAGNOSIS — I110 Hypertensive heart disease with heart failure: Secondary | ICD-10-CM

## 2019-12-01 DIAGNOSIS — L0390 Cellulitis, unspecified: Secondary | ICD-10-CM | POA: Diagnosis not present

## 2019-12-01 DIAGNOSIS — R531 Weakness: Secondary | ICD-10-CM

## 2019-12-01 DIAGNOSIS — G4730 Sleep apnea, unspecified: Secondary | ICD-10-CM | POA: Diagnosis not present

## 2019-12-01 DIAGNOSIS — I5042 Chronic combined systolic (congestive) and diastolic (congestive) heart failure: Secondary | ICD-10-CM

## 2019-12-01 DIAGNOSIS — Z79899 Other long term (current) drug therapy: Secondary | ICD-10-CM | POA: Insufficient documentation

## 2019-12-01 DIAGNOSIS — Z7901 Long term (current) use of anticoagulants: Secondary | ICD-10-CM | POA: Diagnosis not present

## 2019-12-01 DIAGNOSIS — Z09 Encounter for follow-up examination after completed treatment for conditions other than malignant neoplasm: Secondary | ICD-10-CM | POA: Diagnosis not present

## 2019-12-01 NOTE — Progress Notes (Signed)
Impression and Recommendations:    1. Hospital discharge follow-up   2. Necrotizing cellulitis-  R lower ext   3. Hypertensive heart disease with heart failure ([place])   4. Chronic combined systolic and diastolic heart failure ([place])   5. Anemia, unspecified type   6. Long term current use of diuretic   7. acute Weakness generalized   8. Sleep apnea with use of continuous positive airway pressure (CPAP)   9. Chronic atrial fibrillation (HCC)   10. Chronic anticoagulation     Hospital Follow-Up - Reviewed patient's recent experiences in the hospital/follow-up since December 2020.  -Regarding pt's recent hospitalization and/or ED visit: reviewed in great detail recent hospitalization notes, clinical lab tests, tests in the radiology section of CPT, tests in the medicine testing of CPT, and obtained history from family member.  -Independent visualization of image, tracing, or specimen itself done by me today at point of care delivery; directly influenced my plan of care for patient. Moderate complexity  - Discussed recent lab results with patient today. - Education provided and all questions answered.  -  Per patient, HgB was 8.8 yesterday with home health nurse.  - Reviewed support at home.  Patient will continue to obtain physical therapy at home.  Encouraged patient and wife to continue to advocate for the patient's health and strength / wellness at home.  - Advised patient that he will feel more tired and exhausted while he heals, due to his recent extensive blood loss.  - Encouraged patient to eat healthy, foods high in iron, high in protein, colorful, and high in antioxidants. - Told patient to take multivitamin.  - To continue to promote healing, advised patient to elevate his foot / site of issue above the area of heart. - Discussed critical need to relieve the area of fluid / swelling.  - Will continue to monitor lab work and obtain additional labs as needed. -  CBC  and CMP (kidney function, electrolytes) obtained today.  - Patient knows to follow up with cardiology as scheduled.  - Advised patient to obtain appointment with Dr. Mardelle Matte ASAP. - Reviewed that orthopedics will follow the patient's wound treatment and provide monitoring. - Continue prudent wound care as established.  - Told patient to use his walker everywhere he goes. - Told patient to drink adequate amounts of water. - Encouraged patient to continue with [employer].  - Stool cards provided today.  - Will continue to monitor closely. - Reviewed that patient should not be getting worse and should slowly improve.   COVID-19 Vaccination Counseling - COVID-19 vaccination counseling provided today. - Conversation held with patient and wife during appointment. - Encouraged patient to visit any county for immunization if desired. - Advised patient to visit [place] or [place] Department of Health website online.   Orders Placed This Encounter  Procedures  . CBC with Differential/Platelet  . Basic metabolic panel   Gross side effects, risk and benefits, and alternatives of medications and treatment plan in general discussed with patient.  Patient is aware that all medications have potential side effects and we are unable to predict every side effect or drug-drug interaction that may occur.   Patient will call with any questions prior to using medication if they have concerns.    Expresses verbal understanding and consents to current therapy and treatment regimen.  No barriers to understanding were identified.  Red flag symptoms and signs discussed in detail.  Patient expressed understanding regarding what  to do in case of emergency\urgent symptoms  Please see AVS handed out to patient at the end of our visit for further patient instructions/ counseling done pertaining to today's office visit.   Return for additional labs may be needed prior; f/up 34mo or sooner if issues.      Note:  This note was prepared with assistance of Dragon voice recognition software. Occasional wrong-word or sound-a-like substitutions may have occurred due to the inherent limitations of voice recognition software.   This document serves as a record of services personally performed by Mellody Dance, DO. It was created on her behalf by Toni Amend, a trained medical scribe. The creation of this record is based on the scribe's personal observations and the provider's statements to them.   This case required medical decision making of at least moderate complexity. The above documentation from Toni Amend, medical scribe, has been reviewed by Marjory Sneddon, D.O.      --------------------------------------------------------------------------------------------------------------------------------------------------------------------------------------------------------------------------------------------    Subjective:    Joel Dominguez, am serving as scribe for Dr. Mellody Dance.   HPI: Joel Dominguez is a 69 y.o. male who presents to [place] at [facility] today for issues as discussed below.  Patient's wife Joel Dominguez was part of today and participated via cell phone during entire examination/ visit   - Hospital Follow-up Has lost about 30 lbs over the course of his ailments.  He was hospitalized for 24 days.  Notes "I'm doing alright."  He was in orthopedic rehab for six days, and has been out for two weeks.  Notes "I'm starting to get my strength back."  He has therapy coming twice per week, and has "been doing some of the (PT) stuff during the week myself."  He continues feeling weak and tired and wanting to get his stamina back.  His next appointment with cardiology is on February 2.  Confirms urinating, passing stools, and eating okay.  Denies constipation. Feels his diet is back to normal for the most part.  Feels safe at home,  getting around okay.  Wife says "I haven't stolen his walker yet."  In terms of support at home, they don't think he needs anything else.  Notes "that nurse comes in once per week."  Had blood drawn through [facility] a week ago.  Denies symptoms of afib or rapid heart rate.   "I think they took a lot of the blood pressure medicine away because it was too low."  He is taking pain meds off and on sometimes, "but not very often."  He has not followed up with Dr. Mardelle Matte yet.  Confirms everything is continuing to improve and get better.  He elevates his foot as often as possible.  Has SOB and dizziness off and on, "but it's improving."  Wife notes "I really thought we were going to lose him at some point."  Wife has been dressing the area of wound and placing Eucerin cream on his leg.  Per wife, he had blood in his stool in the hospital.  Patient is unsure if this is still happening.  Says he's been drinking water, drinking a protein drink, and sometimes Gatorade.  Per patient's wife, "they kept him away from salad" while he was in the hospital.  HPI:  Hypertension:  -  His blood pressure at home has been running: "goes up and down."  Notes it's a little high when he wakes up in the morning.  This morning was 150-something, whereas at night,  it's 132/68.    Wife notes "I'm not quite sure about the blood pressures."  Says they're high in the morning, come down a little bit in the afternoon.  Notes his medications were changed significantly.  - Patient reports good compliance with medication and/or lifestyle modification.  Continues coreg and carvedilol as indicated by hospital.  When he takes his blood pressure medicine, it goes down into the normal range and he feels fine.  Feels his dizziness has resolved for the most part.  - His denies acute concerns or problems related to treatment plan  - He denies new onset of: chest pain, exercise intolerance, shortness of breath, dizziness,  visual changes, headache, lower extremity swelling or claudication.   Last 3 blood pressure readings in our office are as follows: BP Readings from Last 3 Encounters:  12/07/19 (!) 148/86  12/01/19 (!) 157/82  11/17/19 (!) 159/73   Filed Weights   12/01/19 1312  Weight: (!) 331 lb 3.2 oz (150.2 kg)        Wt Readings from Last 3 Encounters:  12/07/19 (!) 325 lb (147.4 kg)  12/01/19 (!) 331 lb 3.2 oz (150.2 kg)  11/17/19 (!) 320 lb 5.3 oz (145.3 kg)   BP Readings from Last 3 Encounters:  12/07/19 (!) 148/86  12/01/19 (!) 157/82  11/17/19 (!) 159/73   Pulse Readings from Last 3 Encounters:  12/07/19 99  12/01/19 91  11/17/19 82   BMI Readings from Last 3 Encounters:  12/07/19 44.08 kg/m  12/01/19 44.92 kg/m  11/17/19 43.44 kg/m     Patient Care Team    Relationship Specialty Notifications Start End  Mellody Dance, DO PCP - General Family Medicine  10/08/16   Richardo Priest, MD PCP - Cardiology Cardiology  11/11/19   Lucia Bitter., MD Physician Assistant Pain Medicine  11/13/17    Comment: NP Darlin Coco, MD Consulting Physician Cardiology  11/13/17   Edrick Kins, DPM Consulting Physician Podiatry  02/23/19    Comment: provider:  Dr. Prudence Davidson      Patient Active Problem List   Diagnosis Date Noted  . Hypertensive heart disease with heart failure ([place]) 11/10/2017  . Sleep apnea with use of continuous positive airway pressure (CPAP) 10/08/2016  . Pulmonary hypertension ([place]) 10/08/2016  . Obesity, Class III, BMI 40-49.9 (morbid obesity) ([place]) 10/08/2016  . Positive for microalbuminuria 11/19/2017  . h/o Stroke 10/08/2016  . Atherosclerotic heart disease of native coronary artery with unspecified angina pectoris ([place]) 10/08/2016  . Chronic combined systolic and diastolic heart failure ([place]) 10/08/2016  . Chronic atrial fibrillation ([place]) 10/08/2016  . Chronic anticoagulation 10/08/2016  . Generalized OA 10/08/2016  . Foot drop- R  10/08/2016  . Lumbar spinal stenosis 10/08/2016  . Long term current use of diuretic 12/01/2019  . Anemia 12/01/2019  . Heme positive stool 11/17/2019  . Physical debility 11/11/2019  . Acute blood loss anemia 11/05/2019  . Chronic congestive heart failure ([place])   . Longstanding persistent atrial fibrillation ([place])   . Chronic foot ulcer ([place]) 08/08/2019  . Prediabetes 08/08/2019  . Left leg weakness 08/08/2019  . High risk medication use 08/08/2019  . Chronic ulcer of toe ([place]) 08/04/2019  . Pain due to onychomycosis of toenails of both feet 04/30/2019  . Coagulation disorder ([place]) 04/30/2019  . Chronic bilateral low back pain with right-sided sciatica 03/03/2018  . Lumbosacral radiculopathy 01/22/2018  . Osteoarthritis 11/19/2017  . Vitamin D deficiency 11/19/2017  . RBBB (right  bundle branch block with left anterior fascicular block) 11/07/2017  . Aortic stenosis 11/06/2017  . H/O noncompliance with medical treatment, presenting hazards to health 10/22/2017    Past Medical history, Surgical history, Family history, Social history, Allergies and Medications have been entered into the medical record, reviewed and changed as needed.    Current Meds  Medication Sig  . apixaban (ELIQUIS) 5 MG TABS tablet Take 1 tablet (5 mg total) by mouth 2 (two) times daily.  . B Complex-C (B-COMPLEX WITH VITAMIN C) tablet Take 1 tablet by mouth daily.  . carvedilol (COREG) 12.5 MG tablet Take 1 tablet (12.5 mg total) by mouth 2 (two) times daily with a meal.  . cyclobenzaprine (FLEXERIL) 5 MG tablet Take 1 tablet (5 mg total) by mouth 3 (three) times daily as needed for muscle spasms.  . hydrocerin (EUCERIN) CREA Apply 1 application topically 2 (two) times daily.  . Multiple Vitamin (MULTIVITAMIN) tablet Take 1 tablet by mouth daily.  Marland Kitchen senna-docusate (SENOKOT-S) 8.6-50 MG tablet Take 1 tablet by mouth 2 (two) times daily.  . Vitamin D, Cholecalciferol, 50 MCG (2000 UT) CAPS Take 50 mcg by mouth  daily.  . Vitamin D, Ergocalciferol, (DRISDOL) 1.25 MG (50000 UT) CAPS capsule TAKE 1 CAPSULE BY MOUTH EVERY 7 DAYS.  . [DISCONTINUED] bumetanide (BUMEX) 1 MG tablet Take 1 tablet (1 mg total) by mouth daily.    Allergies:  Allergies  Allergen Reactions  . Cephalexin Rash  . Dilaudid [Hydromorphone] Other (See Comments)    Hallucinations     Review of Systems:  A fourteen system review of systems was performed and found to be positive as per HPI.   Objective:   Blood pressure (!) 157/82, pulse 91, temperature 98.2 F (36.8 C), temperature source Oral, resp. rate 16, height 6' (1.829 m), weight (!) 331 lb 3.2 oz (150.2 kg), SpO2 97 %. Body mass index is 44.92 kg/m. General:  Well Developed, well nourished, appropriate for stated age.  Neuro:  Alert and oriented,  extra-ocular muscles intact  HEENT:  Normocephalic, atraumatic, neck supple, no carotid bruits appreciated  Skin:  no gross rash, warm, pink. Cardiac:  RRR, S1 S2 Respiratory:  ECTA B/L and A/P, Not using accessory muscles, speaking in full sentences- unlabored. Vascular:  Ext warm, no cyanosis apprec.; cap RF less 2 sec. Psych:  No HI/SI, judgement and insight good, Euthymic mood. Full Affect.

## 2019-12-01 NOTE — Patient Instructions (Addendum)
Please follow up with Dr. Mardelle Matte of orthopedics asap for a follow-up appointment [phone].  They are the ones for your wound care  Also please do the stool cards we gave you to ensure there is no blood in your stool.  This is 3 separate samplings.  Continue to monitor your blood pressure and pulses.  Follow-up with cardiology as planned in the near future.  Also please make sure you are using your wheeled walker at all times for extra support at this moment.     Anemia  Anemia is a condition in which you do not have enough red blood cells or hemoglobin. Hemoglobin is a substance in red blood cells that carries oxygen. When you do not have enough red blood cells or hemoglobin (are anemic), your body cannot get enough oxygen and your organs may not work properly. As a result, you may feel very tired or have other problems. What are the causes? Common causes of anemia include:  Excessive bleeding. Anemia can be caused by excessive bleeding inside or outside the body, including bleeding from the intestine or from periods in women.  Poor nutrition.  Long-lasting (chronic) kidney, thyroid, and liver disease.  Bone marrow disorders.  Cancer and treatments for cancer.  HIV (human immunodeficiency virus) and AIDS (acquired immunodeficiency syndrome).  Treatments for HIV and AIDS.  Spleen problems.  Blood disorders.  Infections, medicines, and autoimmune disorders that destroy red blood cells. What are the signs or symptoms? Symptoms of this condition include:  Minor weakness.  Dizziness.  Headache.  Feeling heartbeats that are irregular or faster than normal (palpitations).  Shortness of breath, especially with exercise.  Paleness.  Cold sensitivity.  Indigestion.  Nausea.  Difficulty sleeping.  Difficulty concentrating. Symptoms may occur suddenly or develop slowly. If your anemia is mild, you may not have symptoms. How is this diagnosed? This condition is  diagnosed based on:  Blood tests.  Your medical history.  A physical exam.  Bone marrow biopsy. Your health care provider may also check your stool (feces) for blood and may do additional testing to look for the cause of your bleeding. You may also have other tests, including:  Imaging tests, such as a CT scan or MRI.  Endoscopy.  Colonoscopy. How is this treated? Treatment for this condition depends on the cause. If you continue to lose a lot of blood, you may need to be treated at a hospital. Treatment may include:  Taking supplements of iron, vitamin X52, or folic acid.  Taking a hormone medicine (erythropoietin) that can help to stimulate red blood cell growth.  Having a blood transfusion. This may be needed if you lose a lot of blood.  Making changes to your diet.  Having surgery to remove your spleen. Follow these instructions at home:  Take over-the-counter and prescription medicines only as told by your health care provider.  Take supplements only as told by your health care provider.  Follow any diet instructions that you were given.  Keep all follow-up visits as told by your health care provider. This is important. Contact a health care provider if:  You develop new bleeding anywhere in the body. Get help right away if:  You are very weak.  You are short of breath.  You have pain in your abdomen or chest.  You are dizzy or feel faint.  You have trouble concentrating.  You have bloody or black, tarry stools.  You vomit repeatedly or you vomit up blood. Summary  Anemia is  a condition in which you do not have enough red blood cells or enough of a substance in your red blood cells that carries oxygen (hemoglobin).  Symptoms may occur suddenly or develop slowly.  If your anemia is mild, you may not have symptoms.  This condition is diagnosed with blood tests as well as a medical history and physical exam. Other tests may be needed.  Treatment for  this condition depends on the cause of the anemia. This information is not intended to replace advice given to you by your health care provider. Make sure you discuss any questions you have with your health care provider. Document Revised: 10/03/2017 Document Reviewed: 11/22/2016 Elsevier Patient Education  [place].

## 2019-12-02 ENCOUNTER — Telehealth: Payer: Self-pay | Admitting: Cardiology

## 2019-12-02 DIAGNOSIS — L304 Erythema intertrigo: Secondary | ICD-10-CM | POA: Diagnosis not present

## 2019-12-02 DIAGNOSIS — G894 Chronic pain syndrome: Secondary | ICD-10-CM | POA: Diagnosis not present

## 2019-12-02 DIAGNOSIS — Z48817 Encounter for surgical aftercare following surgery on the skin and subcutaneous tissue: Secondary | ICD-10-CM | POA: Diagnosis not present

## 2019-12-02 DIAGNOSIS — I5042 Chronic combined systolic (congestive) and diastolic (congestive) heart failure: Secondary | ICD-10-CM | POA: Diagnosis not present

## 2019-12-02 DIAGNOSIS — M5441 Lumbago with sciatica, right side: Secondary | ICD-10-CM | POA: Diagnosis not present

## 2019-12-02 DIAGNOSIS — Z4801 Encounter for change or removal of surgical wound dressing: Secondary | ICD-10-CM | POA: Diagnosis not present

## 2019-12-02 DIAGNOSIS — I110 Hypertensive heart disease with heart failure: Secondary | ICD-10-CM | POA: Diagnosis not present

## 2019-12-02 DIAGNOSIS — M5417 Radiculopathy, lumbosacral region: Secondary | ICD-10-CM | POA: Diagnosis not present

## 2019-12-02 DIAGNOSIS — L03115 Cellulitis of right lower limb: Secondary | ICD-10-CM | POA: Diagnosis not present

## 2019-12-02 DIAGNOSIS — M79604 Pain in right leg: Secondary | ICD-10-CM | POA: Diagnosis not present

## 2019-12-02 LAB — CBC WITH DIFFERENTIAL/PLATELET
Basophils Absolute: 0.1 10*3/uL (ref 0.0–0.2)
Basos: 1 %
EOS (ABSOLUTE): 0.4 10*3/uL (ref 0.0–0.4)
Eos: 4 %
Hematocrit: 30.8 % — ABNORMAL LOW (ref 37.5–51.0)
Hemoglobin: 9.8 g/dL — ABNORMAL LOW (ref 13.0–17.7)
Immature Grans (Abs): 0 10*3/uL (ref 0.0–0.1)
Immature Granulocytes: 0 %
Lymphocytes Absolute: 1.3 10*3/uL (ref 0.7–3.1)
Lymphs: 14 %
MCH: 26.6 pg (ref 26.6–33.0)
MCHC: 31.8 g/dL (ref 31.5–35.7)
MCV: 84 fL (ref 79–97)
Monocytes Absolute: 0.5 10*3/uL (ref 0.1–0.9)
Monocytes: 6 %
Neutrophils Absolute: 7.1 10*3/uL — ABNORMAL HIGH (ref 1.4–7.0)
Neutrophils: 75 %
Platelets: 469 10*3/uL — ABNORMAL HIGH (ref 150–450)
RBC: 3.68 x10E6/uL — ABNORMAL LOW (ref 4.14–5.80)
RDW: 15.4 % (ref 11.6–15.4)
WBC: 9.4 10*3/uL (ref 3.4–10.8)

## 2019-12-02 LAB — BASIC METABOLIC PANEL
BUN/Creatinine Ratio: 15 (ref 10–24)
BUN: 14 mg/dL (ref 8–27)
CO2: 23 mmol/L (ref 20–29)
Calcium: 9.1 mg/dL (ref 8.6–10.2)
Chloride: 102 mmol/L (ref 96–106)
Creatinine, Ser: 0.92 mg/dL (ref 0.76–1.27)
GFR calc Af Amer: 98 mL/min/{1.73_m2} (ref >59)
GFR calc non Af Amer: 85 mL/min/{1.73_m2} (ref >59)
Glucose: 113 mg/dL — ABNORMAL HIGH (ref 65–99)
Potassium: 4.5 mmol/L (ref 3.5–5.2)
Sodium: 137 mmol/L (ref 134–144)

## 2019-12-02 NOTE — Telephone Encounter (Signed)
Pt will need his wife to accompany him to his OV with Dr. Bettina Gavia on 12/07/19, due to having to use a walker and needing further assistance ambulating. Updated this in appt notes to allow his wife to accompany him to his appt.  Advised the pt that they both need to wear their mask to the appt, and during the entire duration of their appt.  Pt verbalized understanding and agrees with this plan.

## 2019-12-02 NOTE — Telephone Encounter (Signed)
Patient calling to request his wife come with him to his appointment 2/2. He says he uses a walker and needs his wife to help him walk as well.

## 2019-12-06 NOTE — Progress Notes (Signed)
Cardiology Office Note:    Date:  12/07/2019   ID:  Joel Dominguez, DOB [birth year], MRN [id number]  PCP:  Mellody Dance, DO  Cardiologist:  Shirlee More, MD    Referring MD: Mellody Dance, DO    ASSESSMENT:    1. Chronic atrial fibrillation ([place])   2. Chronic anticoagulation   3. Nonrheumatic aortic valve stenosis   4. Chronic combined systolic and diastolic heart failure ([place])   5. 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 fascicular block)   6. Hypertensive heart disease with heart failure ([place])    PLAN:    In order of problems listed above:  1. Rate controlled continue beta-blocker anticoagulant 2. Stable aortic stenosis will need a repeat echocardiogram 1 to 2 years 3. Heart failure is mildly decompensated fluid overload you take an extra tablet of Bumex Monday Wednesday and Friday. 4. Stable EKG pattern 5. BP mildly elevated his wife will contact me if systolics remain greater than 140 and we can put him back on an ARB if renal function is normalized   Next appointment: Months   Medication Adjustments/Labs and Tests Ordered: Current medicines are reviewed at length with the patient today.  Concerns regarding medicines are outlined above.  No orders of the defined types were placed in this encounter.  No orders of the defined types were placed in this encounter.   Chief Complaint  Patient presents with  . Follow-up  . Atrial Fibrillation  . Anticoagulation  . Congestive Heart Failure  . Aortic Stenosis  . Hypertension    History of Present Illness:     Joel Dominguez is a 69 y.o. male with a hx of chronic atrial fibrillation, mild aortic stenosis on echo 2016, heart failure, RBBB with LAHB, hypertension, anticoagulation and PAH  last seen 06/14/2019. Compliance with diet, lifestyle and medications: Yes  Echocardiogram 07/01/2019: Continued mild aortic stenosis normal ejection fraction 1. The left ventricle has low normal systolic function,  with an ejection  fraction of 50-55%. The cavity size was normal. There is moderate  concentric left ventricular hypertrophy. Left ventricular diastolic  function could not be evaluated secondary to  atrial fibrillation. No evidence of left ventricular regional wall motion  abnormalities.  2. The right ventricle has normal systolic function. The cavity was  moderately enlarged. There is no increase in right ventricular wall  thickness.  3. Left atrial size was mildly dilated.  4. The mitral valve is degenerative. There is moderate mitral annular  calcification present. Mitral valve regurgitation is moderate by color  flow Doppler. No evidence of mitral valve stenosis.  5. The aortic valve is tricuspid. Mild thickening of the aortic valve.  Aortic valve regurgitation is mild by color flow Doppler. Mild stenosis of  the aortic valve  6. The ascending aorta and aortic root are normal in size and structure.   Prolonged hospitalization December January with necrotizing fasciitis.  So stated he had renal dysfunction hypotension withdrawal of medications up with back on a low-dose of a diuretic.  His blood pressure at home is elevated greater than 140 and he has peripheral edema.  We will increase the dose of his loop diuretic which should help him with fluid retention and get a systolic blood pressure less than 140.  He is having no chest pain shortness of breath or palpitation. Past Medical History:  Diagnosis Date  . A-fib 10/08/2016  . Aortic stenosis 11/06/2017  . Atherosclerotic heart disease of native coronary artery with unspecified angina pectoris ([place])  10/08/2016  . Cellulitis 11/2019   right lower extremity  . CHF (congestive heart failure) ([place])   . Chronic anticoagulation 10/08/2016  . Foot drop- R 10/08/2016   Patient known foot drop for many years due to lumbar spinal stenosis.-Over the past couple of months symptoms have gotten worse.  He feels he is tripping more and cannot  lift his foot up as much. Toes are curling and calluses on end of toes.    - Did see spinal surgeon many yrs ago.  Declined sx at that time.     . Generalized OA 10/08/2016   Ortho doc-  Dr. [facility] in [place]- both knees replaced 08, 09.     . H/O noncompliance with medical treatment, presenting hazards to health 10/22/2017  . Heart disease   . Hypertension   . Obesity, Class III, BMI 40-49.9 (morbid obesity) ([place]) 10/08/2016  . OSA (obstructive sleep apnea) 10/08/2016   Bi-PAP nitely- been 20+ yrs now  . Pulmonary hypertension ([place]) 10/08/2016  . Spinal stenosis, lumbar region, with neurogenic claudication 10/08/2016   Back specialist in [place]-    only surgeon he would use- pt declined referral here for back pain mgt    . Stroke [facility])     Past Surgical History:  Procedure Laterality Date  . I & D EXTREMITY Right 10/30/2019   Procedure: IRRIGATION AND DEBRIDEMENT EXTREMITY, right leg possible wound vac placement;  Surgeon: Marchia Bond, MD;  Location: [facility];  Service: Orthopedics;  Laterality: Right;  . REPLACEMENT TOTAL KNEE Bilateral   . TIBIA FRACTURE SURGERY      Current Medications: Current Meds  Medication Sig  . apixaban (ELIQUIS) 5 MG TABS tablet Take 1 tablet (5 mg total) by mouth 2 (two) times daily.  . B Complex-C (B-COMPLEX WITH VITAMIN C) tablet Take 1 tablet by mouth daily.  . bumetanide (BUMEX) 1 MG tablet Take 1 tablet (1 mg total) by mouth daily.  . carvedilol (COREG) 12.5 MG tablet Take 1 tablet (12.5 mg total) by mouth 2 (two) times daily with a meal.  . cyclobenzaprine (FLEXERIL) 5 MG tablet Take 1 tablet (5 mg total) by mouth 3 (three) times daily as needed for muscle spasms.  . hydrocerin (EUCERIN) CREA Apply 1 application topically 2 (two) times daily.  Derrill Memo ON 12/30/2019] HYDROcodone-acetaminophen (NORCO) 10-325 MG tablet Take by mouth.  . Multiple Vitamin (MULTIVITAMIN) tablet Take 1 tablet by mouth daily.  Marland Kitchen oxyCODONE 10 MG TABS Take 0.5-1 tablets (5-10 mg  total) by mouth 3 (three) times daily as needed for severe pain.  Marland Kitchen senna-docusate (SENOKOT-S) 8.6-50 MG tablet Take 1 tablet by mouth 2 (two) times daily.  . Vitamin D, Cholecalciferol, 50 MCG (2000 UT) CAPS Take 50 mcg by mouth daily.  . Vitamin D, Ergocalciferol, (DRISDOL) 1.25 MG (50000 UT) CAPS capsule TAKE 1 CAPSULE BY MOUTH EVERY 7 DAYS.     Allergies:   Cephalexin and Dilaudid [hydromorphone]   Social History   Socioeconomic History  . Marital status: Married    Spouse name: Not on file  . Number of children: Not on file  . Years of education: Not on file  . Highest education level: Not on file  Occupational History  . Not on file  Tobacco Use  . Smoking status: Never Smoker  . Smokeless tobacco: Never Used  Substance and Sexual Activity  . Alcohol use: Not Currently  . Drug use: No  . Sexual activity: Not on file  Other Topics Concern  .  Not on file  Social History Narrative  . Not on file   Social Determinants of Health   Financial Resource Strain:   . Difficulty of Paying Living Expenses: Not on file  Food Insecurity:   . Worried About Charity fundraiser in the Last Year: Not on file  . Ran Out of Food in the Last Year: Not on file  Transportation Needs:   . Lack of Transportation (Medical): Not on file  . Lack of Transportation (Non-Medical): Not on file  Physical Activity:   . Days of Exercise per Week: Not on file  . Minutes of Exercise per Session: Not on file  Stress:   . Feeling of Stress : Not on file  Social Connections:   . Frequency of Communication with Friends and Family: Not on file  . Frequency of Social Gatherings with Friends and Family: Not on file  . Attends Religious Services: Not on file  . Active Member of Clubs or Organizations: Not on file  . Attends Archivist Meetings: Not on file  . Marital Status: Not on file     Family History: The patient's family history includes Cancer in his father; Congestive Heart Failure  in his mother and sister; Diabetes in his brother and mother; Hypertension in his brother, mother, sister, and sister. ROS:   Please see the history of present illness.    All other systems reviewed and are negative.  EKGs/Labs/Other Studies Reviewed:    The following studies were reviewed today:  EKG:  EKG 11/15/2019 atrial fibrillation 113 bpm bifascicular heart block  Recent Labs: 03/08/2019: TSH 0.800 11/01/2019: Magnesium 2.3 11/12/2019: ALT 16 12/01/2019: BUN 14; Creatinine, Ser 0.92; Hemoglobin 9.8; Platelets 469; Potassium 4.5; Sodium 137  Recent Lipid Panel    Component Value Date/Time   CHOL 178 03/08/2019 1038   TRIG 251 (H) 03/08/2019 1038   HDL 36 (L) 03/08/2019 1038   CHOLHDL 4.9 03/08/2019 1038   CHOLHDL 4.3 10/08/2016 1144   VLDL 45 (H) 10/08/2016 1144   LDLCALC 92 03/08/2019 1038    Physical Exam:    VS:  BP (!) 148/86   Pulse 99   Ht 6' (1.829 m)   Wt (!) 325 lb (147.4 kg)   SpO2 95%   BMI 44.08 kg/m     Wt Readings from Last 3 Encounters:  12/07/19 (!) 325 lb (147.4 kg)  12/01/19 (!) 331 lb 3.2 oz (150.2 kg)  11/17/19 (!) 320 lb 5.3 oz (145.3 kg)     GEN:  Well nourished, well developed in no acute distress HEENT: Normal NECK: No JVD; No carotid bruits LYMPHATICS: No lymphadenopathy CARDIAC: Irregular S1 variable 1/6 to 2/6 aortic outflow murmur , no  rubs, gallops RESPIRATORY:  Clear to auscultation without rales, wheezing or rhonchi  ABDOMEN: Soft, non-tender, non-distended MUSCULOSKELETAL: Right lower extremity 2-3+ pitting left lower extremity 1+ pitting edema; No deformity  SKIN: Warm and dry NEUROLOGIC:  Alert and oriented x 3 PSYCHIATRIC:  Normal affect    Signed, Shirlee More, MD  12/07/2019 2:57 PM    [facility]

## 2019-12-07 ENCOUNTER — Encounter: Payer: Self-pay | Admitting: Cardiology

## 2019-12-07 ENCOUNTER — Other Ambulatory Visit: Payer: Self-pay

## 2019-12-07 ENCOUNTER — Ambulatory Visit (INDEPENDENT_AMBULATORY_CARE_PROVIDER_SITE_OTHER): Payer: Medicare Other | Admitting: Cardiology

## 2019-12-07 VITALS — BP 148/86 | HR 99 | Ht 72.0 in | Wt 325.0 lb

## 2019-12-07 DIAGNOSIS — Z7901 Long term (current) use of anticoagulants: Secondary | ICD-10-CM | POA: Diagnosis not present

## 2019-12-07 DIAGNOSIS — I452 Bifascicular block: Secondary | ICD-10-CM | POA: Diagnosis not present

## 2019-12-07 DIAGNOSIS — I110 Hypertensive heart disease with heart failure: Secondary | ICD-10-CM | POA: Diagnosis not present

## 2019-12-07 DIAGNOSIS — I482 Chronic atrial fibrillation, unspecified: Secondary | ICD-10-CM

## 2019-12-07 DIAGNOSIS — I5042 Chronic combined systolic (congestive) and diastolic (congestive) heart failure: Secondary | ICD-10-CM

## 2019-12-07 DIAGNOSIS — I350 Nonrheumatic aortic (valve) stenosis: Secondary | ICD-10-CM

## 2019-12-07 MED ORDER — BUMETANIDE 1 MG PO TABS: 1.0000 mg | ORAL_TABLET | Freq: Every day | ORAL | 1 refills | Status: DC

## 2019-12-07 NOTE — Patient Instructions (Signed)
Medication Instructions:  Your physician has recommended you make the following change in your medication:   START taking Bumex M-W-F night  *If you need a refill on your cardiac medications before your next appointment, please call your pharmacy*  Lab Work: NONE If you have labs (blood work) drawn today and your tests are completely normal, you will receive your results only by: Marland Kitchen MyChart Message (if you have MyChart) OR . A paper copy in the mail If you have any lab test that is abnormal or we need to change your treatment, we will call you to review the results.  Testing/Procedures: NONE  Follow-Up: At [facility], you and your health needs are our priority.  As part of our continuing mission to provide you with exceptional heart care, we have created designated Provider Care Teams.  These Care Teams include your primary Cardiologist (physician) and Advanced Practice Providers (APPs -  Physician Assistants and Nurse Practitioners) who all work together to provide you with the care you need, when you need it.  Your next appointment:   3 month(s)  The format for your next appointment:   In Person  Provider:   Shirlee More, MD

## 2019-12-07 NOTE — Addendum Note (Signed)
Addended by: Beckey Rutter on: 12/07/2019 03:24 PM   Modules accepted: Orders

## 2019-12-08 ENCOUNTER — Telehealth: Payer: Self-pay | Admitting: Family Medicine

## 2019-12-08 DIAGNOSIS — L304 Erythema intertrigo: Secondary | ICD-10-CM | POA: Diagnosis not present

## 2019-12-08 DIAGNOSIS — I5042 Chronic combined systolic (congestive) and diastolic (congestive) heart failure: Secondary | ICD-10-CM | POA: Diagnosis not present

## 2019-12-08 DIAGNOSIS — Z48817 Encounter for surgical aftercare following surgery on the skin and subcutaneous tissue: Secondary | ICD-10-CM | POA: Diagnosis not present

## 2019-12-08 DIAGNOSIS — Z4801 Encounter for change or removal of surgical wound dressing: Secondary | ICD-10-CM | POA: Diagnosis not present

## 2019-12-08 DIAGNOSIS — L03115 Cellulitis of right lower limb: Secondary | ICD-10-CM | POA: Diagnosis not present

## 2019-12-08 DIAGNOSIS — I110 Hypertensive heart disease with heart failure: Secondary | ICD-10-CM | POA: Diagnosis not present

## 2019-12-08 NOTE — Telephone Encounter (Signed)
Called the number back that is listed below. The mailbox is full so I am unable to leave a voicemail.    Spoke with Dr. Raliegh Scarlet and she says YES to verbal order for PT. AS, CMA

## 2019-12-08 NOTE — Telephone Encounter (Signed)
Per Alden Benjamin @ [facility] [phone] they are requesting (Verbal Auth ) to continue home health PT for  (2xs) weekly for the next (4weeks).  --Forwarding request to med asst to contact Agency rep (Authorization can be left on private voicemail) @ [phone].  --glh

## 2019-12-10 DIAGNOSIS — L03115 Cellulitis of right lower limb: Secondary | ICD-10-CM | POA: Diagnosis not present

## 2019-12-14 DIAGNOSIS — L304 Erythema intertrigo: Secondary | ICD-10-CM | POA: Diagnosis not present

## 2019-12-14 DIAGNOSIS — L03115 Cellulitis of right lower limb: Secondary | ICD-10-CM | POA: Diagnosis not present

## 2019-12-14 DIAGNOSIS — Z48817 Encounter for surgical aftercare following surgery on the skin and subcutaneous tissue: Secondary | ICD-10-CM | POA: Diagnosis not present

## 2019-12-14 DIAGNOSIS — I110 Hypertensive heart disease with heart failure: Secondary | ICD-10-CM | POA: Diagnosis not present

## 2019-12-14 DIAGNOSIS — I5042 Chronic combined systolic (congestive) and diastolic (congestive) heart failure: Secondary | ICD-10-CM | POA: Diagnosis not present

## 2019-12-14 DIAGNOSIS — Z4801 Encounter for change or removal of surgical wound dressing: Secondary | ICD-10-CM | POA: Diagnosis not present

## 2019-12-15 ENCOUNTER — Other Ambulatory Visit: Payer: Self-pay | Admitting: Cardiology

## 2019-12-16 DIAGNOSIS — Z4801 Encounter for change or removal of surgical wound dressing: Secondary | ICD-10-CM | POA: Diagnosis not present

## 2019-12-16 DIAGNOSIS — L03115 Cellulitis of right lower limb: Secondary | ICD-10-CM | POA: Diagnosis not present

## 2019-12-16 DIAGNOSIS — I110 Hypertensive heart disease with heart failure: Secondary | ICD-10-CM | POA: Diagnosis not present

## 2019-12-16 DIAGNOSIS — L304 Erythema intertrigo: Secondary | ICD-10-CM | POA: Diagnosis not present

## 2019-12-16 DIAGNOSIS — I5042 Chronic combined systolic (congestive) and diastolic (congestive) heart failure: Secondary | ICD-10-CM | POA: Diagnosis not present

## 2019-12-16 DIAGNOSIS — Z48817 Encounter for surgical aftercare following surgery on the skin and subcutaneous tissue: Secondary | ICD-10-CM | POA: Diagnosis not present

## 2019-12-17 ENCOUNTER — Other Ambulatory Visit: Payer: Self-pay | Admitting: Physical Medicine and Rehabilitation

## 2019-12-17 DIAGNOSIS — I5042 Chronic combined systolic (congestive) and diastolic (congestive) heart failure: Secondary | ICD-10-CM | POA: Diagnosis not present

## 2019-12-17 DIAGNOSIS — Z4801 Encounter for change or removal of surgical wound dressing: Secondary | ICD-10-CM | POA: Diagnosis not present

## 2019-12-17 DIAGNOSIS — L03115 Cellulitis of right lower limb: Secondary | ICD-10-CM | POA: Diagnosis not present

## 2019-12-17 DIAGNOSIS — L304 Erythema intertrigo: Secondary | ICD-10-CM | POA: Diagnosis not present

## 2019-12-17 DIAGNOSIS — Z23 Encounter for immunization: Secondary | ICD-10-CM | POA: Diagnosis not present

## 2019-12-17 DIAGNOSIS — I110 Hypertensive heart disease with heart failure: Secondary | ICD-10-CM | POA: Diagnosis not present

## 2019-12-17 DIAGNOSIS — Z48817 Encounter for surgical aftercare following surgery on the skin and subcutaneous tissue: Secondary | ICD-10-CM | POA: Diagnosis not present

## 2019-12-17 NOTE — Telephone Encounter (Signed)
Will need to be refilled and managed by patients primary care provider.

## 2019-12-18 DIAGNOSIS — R2689 Other abnormalities of gait and mobility: Secondary | ICD-10-CM | POA: Diagnosis not present

## 2019-12-18 DIAGNOSIS — I482 Chronic atrial fibrillation, unspecified: Secondary | ICD-10-CM | POA: Diagnosis not present

## 2019-12-18 DIAGNOSIS — L03115 Cellulitis of right lower limb: Secondary | ICD-10-CM | POA: Diagnosis not present

## 2019-12-18 DIAGNOSIS — L304 Erythema intertrigo: Secondary | ICD-10-CM | POA: Diagnosis not present

## 2019-12-18 DIAGNOSIS — I110 Hypertensive heart disease with heart failure: Secondary | ICD-10-CM | POA: Diagnosis not present

## 2019-12-18 DIAGNOSIS — M21371 Foot drop, right foot: Secondary | ICD-10-CM | POA: Diagnosis not present

## 2019-12-18 DIAGNOSIS — Z6841 Body Mass Index (BMI) 40.0 and over, adult: Secondary | ICD-10-CM | POA: Diagnosis not present

## 2019-12-18 DIAGNOSIS — Z4801 Encounter for change or removal of surgical wound dressing: Secondary | ICD-10-CM | POA: Diagnosis not present

## 2019-12-18 DIAGNOSIS — I5042 Chronic combined systolic (congestive) and diastolic (congestive) heart failure: Secondary | ICD-10-CM | POA: Diagnosis not present

## 2019-12-18 DIAGNOSIS — Z48817 Encounter for surgical aftercare following surgery on the skin and subcutaneous tissue: Secondary | ICD-10-CM | POA: Diagnosis not present

## 2019-12-20 DIAGNOSIS — L304 Erythema intertrigo: Secondary | ICD-10-CM | POA: Diagnosis not present

## 2019-12-20 DIAGNOSIS — L03115 Cellulitis of right lower limb: Secondary | ICD-10-CM | POA: Diagnosis not present

## 2019-12-20 DIAGNOSIS — Z4801 Encounter for change or removal of surgical wound dressing: Secondary | ICD-10-CM | POA: Diagnosis not present

## 2019-12-20 DIAGNOSIS — Z48817 Encounter for surgical aftercare following surgery on the skin and subcutaneous tissue: Secondary | ICD-10-CM | POA: Diagnosis not present

## 2019-12-20 DIAGNOSIS — I110 Hypertensive heart disease with heart failure: Secondary | ICD-10-CM | POA: Diagnosis not present

## 2019-12-20 DIAGNOSIS — I5042 Chronic combined systolic (congestive) and diastolic (congestive) heart failure: Secondary | ICD-10-CM | POA: Diagnosis not present

## 2019-12-22 DIAGNOSIS — L304 Erythema intertrigo: Secondary | ICD-10-CM | POA: Diagnosis not present

## 2019-12-22 DIAGNOSIS — I5042 Chronic combined systolic (congestive) and diastolic (congestive) heart failure: Secondary | ICD-10-CM | POA: Diagnosis not present

## 2019-12-22 DIAGNOSIS — Z4801 Encounter for change or removal of surgical wound dressing: Secondary | ICD-10-CM | POA: Diagnosis not present

## 2019-12-22 DIAGNOSIS — L03115 Cellulitis of right lower limb: Secondary | ICD-10-CM | POA: Diagnosis not present

## 2019-12-22 DIAGNOSIS — Z48817 Encounter for surgical aftercare following surgery on the skin and subcutaneous tissue: Secondary | ICD-10-CM | POA: Diagnosis not present

## 2019-12-22 DIAGNOSIS — I110 Hypertensive heart disease with heart failure: Secondary | ICD-10-CM | POA: Diagnosis not present

## 2019-12-28 DIAGNOSIS — Z4801 Encounter for change or removal of surgical wound dressing: Secondary | ICD-10-CM | POA: Diagnosis not present

## 2019-12-28 DIAGNOSIS — Z48817 Encounter for surgical aftercare following surgery on the skin and subcutaneous tissue: Secondary | ICD-10-CM | POA: Diagnosis not present

## 2019-12-28 DIAGNOSIS — L304 Erythema intertrigo: Secondary | ICD-10-CM | POA: Diagnosis not present

## 2019-12-28 DIAGNOSIS — I5042 Chronic combined systolic (congestive) and diastolic (congestive) heart failure: Secondary | ICD-10-CM | POA: Diagnosis not present

## 2019-12-28 DIAGNOSIS — L03115 Cellulitis of right lower limb: Secondary | ICD-10-CM | POA: Diagnosis not present

## 2019-12-28 DIAGNOSIS — I110 Hypertensive heart disease with heart failure: Secondary | ICD-10-CM | POA: Diagnosis not present

## 2019-12-29 DIAGNOSIS — Z4801 Encounter for change or removal of surgical wound dressing: Secondary | ICD-10-CM | POA: Diagnosis not present

## 2019-12-29 DIAGNOSIS — L03115 Cellulitis of right lower limb: Secondary | ICD-10-CM | POA: Diagnosis not present

## 2019-12-29 DIAGNOSIS — I110 Hypertensive heart disease with heart failure: Secondary | ICD-10-CM | POA: Diagnosis not present

## 2019-12-29 DIAGNOSIS — I5042 Chronic combined systolic (congestive) and diastolic (congestive) heart failure: Secondary | ICD-10-CM | POA: Diagnosis not present

## 2019-12-29 DIAGNOSIS — L304 Erythema intertrigo: Secondary | ICD-10-CM | POA: Diagnosis not present

## 2019-12-29 DIAGNOSIS — Z48817 Encounter for surgical aftercare following surgery on the skin and subcutaneous tissue: Secondary | ICD-10-CM | POA: Diagnosis not present

## 2019-12-30 DIAGNOSIS — I110 Hypertensive heart disease with heart failure: Secondary | ICD-10-CM | POA: Diagnosis not present

## 2019-12-30 DIAGNOSIS — Z4801 Encounter for change or removal of surgical wound dressing: Secondary | ICD-10-CM | POA: Diagnosis not present

## 2019-12-30 DIAGNOSIS — I5042 Chronic combined systolic (congestive) and diastolic (congestive) heart failure: Secondary | ICD-10-CM | POA: Diagnosis not present

## 2019-12-30 DIAGNOSIS — Z48817 Encounter for surgical aftercare following surgery on the skin and subcutaneous tissue: Secondary | ICD-10-CM | POA: Diagnosis not present

## 2019-12-30 DIAGNOSIS — L304 Erythema intertrigo: Secondary | ICD-10-CM | POA: Diagnosis not present

## 2019-12-30 DIAGNOSIS — L03115 Cellulitis of right lower limb: Secondary | ICD-10-CM | POA: Diagnosis not present

## 2020-01-03 DIAGNOSIS — Z4801 Encounter for change or removal of surgical wound dressing: Secondary | ICD-10-CM | POA: Diagnosis not present

## 2020-01-03 DIAGNOSIS — L304 Erythema intertrigo: Secondary | ICD-10-CM | POA: Diagnosis not present

## 2020-01-03 DIAGNOSIS — I110 Hypertensive heart disease with heart failure: Secondary | ICD-10-CM | POA: Diagnosis not present

## 2020-01-03 DIAGNOSIS — L03115 Cellulitis of right lower limb: Secondary | ICD-10-CM | POA: Diagnosis not present

## 2020-01-03 DIAGNOSIS — Z48817 Encounter for surgical aftercare following surgery on the skin and subcutaneous tissue: Secondary | ICD-10-CM | POA: Diagnosis not present

## 2020-01-03 DIAGNOSIS — I5042 Chronic combined systolic (congestive) and diastolic (congestive) heart failure: Secondary | ICD-10-CM | POA: Diagnosis not present

## 2020-01-14 DIAGNOSIS — Z23 Encounter for immunization: Secondary | ICD-10-CM | POA: Diagnosis not present

## 2020-01-14 DIAGNOSIS — L03115 Cellulitis of right lower limb: Secondary | ICD-10-CM | POA: Diagnosis not present

## 2020-01-14 DIAGNOSIS — Z48817 Encounter for surgical aftercare following surgery on the skin and subcutaneous tissue: Secondary | ICD-10-CM | POA: Diagnosis not present

## 2020-01-14 DIAGNOSIS — I110 Hypertensive heart disease with heart failure: Secondary | ICD-10-CM | POA: Diagnosis not present

## 2020-01-14 DIAGNOSIS — I5042 Chronic combined systolic (congestive) and diastolic (congestive) heart failure: Secondary | ICD-10-CM | POA: Diagnosis not present

## 2020-01-14 DIAGNOSIS — L304 Erythema intertrigo: Secondary | ICD-10-CM | POA: Diagnosis not present

## 2020-01-14 DIAGNOSIS — Z4801 Encounter for change or removal of surgical wound dressing: Secondary | ICD-10-CM | POA: Diagnosis not present

## 2020-01-24 DIAGNOSIS — H3562 Retinal hemorrhage, left eye: Secondary | ICD-10-CM | POA: Diagnosis not present

## 2020-01-24 DIAGNOSIS — H34832 Tributary (branch) retinal vein occlusion, left eye, with macular edema: Secondary | ICD-10-CM | POA: Diagnosis not present

## 2020-01-24 DIAGNOSIS — H35033 Hypertensive retinopathy, bilateral: Secondary | ICD-10-CM | POA: Diagnosis not present

## 2020-01-24 DIAGNOSIS — H43392 Other vitreous opacities, left eye: Secondary | ICD-10-CM | POA: Diagnosis not present

## 2020-01-24 DIAGNOSIS — H5319 Other subjective visual disturbances: Secondary | ICD-10-CM | POA: Diagnosis not present

## 2020-01-31 ENCOUNTER — Other Ambulatory Visit: Payer: Self-pay | Admitting: Family Medicine

## 2020-01-31 DIAGNOSIS — I272 Pulmonary hypertension, unspecified: Secondary | ICD-10-CM

## 2020-01-31 DIAGNOSIS — I110 Hypertensive heart disease with heart failure: Secondary | ICD-10-CM

## 2020-01-31 DIAGNOSIS — I5042 Chronic combined systolic (congestive) and diastolic (congestive) heart failure: Secondary | ICD-10-CM

## 2020-02-24 DIAGNOSIS — M5441 Lumbago with sciatica, right side: Secondary | ICD-10-CM | POA: Diagnosis not present

## 2020-02-24 DIAGNOSIS — M5417 Radiculopathy, lumbosacral region: Secondary | ICD-10-CM | POA: Diagnosis not present

## 2020-02-24 DIAGNOSIS — G894 Chronic pain syndrome: Secondary | ICD-10-CM | POA: Diagnosis not present

## 2020-02-24 DIAGNOSIS — M79604 Pain in right leg: Secondary | ICD-10-CM | POA: Diagnosis not present

## 2020-02-28 ENCOUNTER — Ambulatory Visit (INDEPENDENT_AMBULATORY_CARE_PROVIDER_SITE_OTHER): Payer: Medicare Other | Admitting: Family Medicine

## 2020-02-28 ENCOUNTER — Encounter: Payer: Self-pay | Admitting: Family Medicine

## 2020-02-28 ENCOUNTER — Other Ambulatory Visit: Payer: Self-pay

## 2020-02-28 VITALS — BP 138/70 | HR 87 | Temp 97.7°F | Ht 72.0 in | Wt 325.3 lb

## 2020-02-28 DIAGNOSIS — R7303 Prediabetes: Secondary | ICD-10-CM

## 2020-02-28 DIAGNOSIS — R809 Proteinuria, unspecified: Secondary | ICD-10-CM | POA: Diagnosis not present

## 2020-02-28 DIAGNOSIS — Z7901 Long term (current) use of anticoagulants: Secondary | ICD-10-CM

## 2020-02-28 DIAGNOSIS — I482 Chronic atrial fibrillation, unspecified: Secondary | ICD-10-CM | POA: Diagnosis not present

## 2020-02-28 DIAGNOSIS — I110 Hypertensive heart disease with heart failure: Secondary | ICD-10-CM

## 2020-02-28 DIAGNOSIS — E559 Vitamin D deficiency, unspecified: Secondary | ICD-10-CM | POA: Diagnosis not present

## 2020-02-28 DIAGNOSIS — E66813 Obesity, class 3: Secondary | ICD-10-CM

## 2020-02-28 NOTE — Patient Instructions (Signed)
Your goal blood pressure should be around 135/85 or less on a regular basis.    However, if you are 69 years old or greater, then higher blood pressures are often tolerated based on your medical conditions.  Please follow the recommendations discussed with you by your primary care provider and/or cardiologist.       Hypertension Hypertension, commonly called high blood pressure, is when the force of blood pumping through the arteries is too strong. The arteries are the blood vessels that carry blood from the heart throughout the body. Hypertension forces the heart to work harder to pump blood and may cause arteries to become narrow or stiff. Having untreated or uncontrolled hypertension can cause heart attacks, strokes, kidney disease, and other problems. A blood pressure reading consists of a higher number over a lower number. Ideally, your blood pressure should be below 120/80. The first ("top") number is called the systolic pressure. It is a measure of the pressure in your arteries as your heart beats. The second ("bottom") number is called the diastolic pressure. It is a measure of the pressure in your arteries as the heart relaxes. What are the causes? The cause of this condition is not known. What increases the risk? Some risk factors for high blood pressure are under your control. Others are not. Factors you can change  Smoking.  Having type 2 diabetes mellitus, high cholesterol, or both.  Not getting enough exercise or physical activity.  Being overweight.  Having too much fat, sugar, calories, or salt (sodium) in your diet.  Drinking too much alcohol. Factors that are difficult or impossible to change  Having chronic kidney disease.  Having a family history of high blood pressure.  Age. Risk increases with age.  Race. You may be at higher risk if you are African-American.  Gender. Men are at higher risk than women before age 45. After age 69, women are at higher risk than  men.  Having obstructive sleep apnea.  Stress. What are the signs or symptoms? Extremely high blood pressure (hypertensive crisis) may cause:  Headache.  Anxiety.  Shortness of breath.  Nosebleed.  Nausea and vomiting.  Severe chest pain.  Jerky movements you cannot control (seizures).  How is this diagnosed? This condition is diagnosed by measuring your blood pressure while you are seated, with your arm resting on a surface. The cuff of the blood pressure monitor will be placed directly against the skin of your upper arm at the level of your heart. It should be measured at least twice using the same arm. Certain conditions can cause a difference in blood pressure between your right and left arms. Certain factors can cause blood pressure readings to be lower or higher than normal (elevated) for a short period of time:  When your blood pressure is higher when you are in a health care provider's office than when you are at home, this is called white coat hypertension. Most people with this condition do not need medicines.  When your blood pressure is higher at home than when you are in a health care provider's office, this is called masked hypertension. Most people with this condition may need medicines to control blood pressure.  If you have a high blood pressure reading during one visit or you have normal blood pressure with other risk factors:  You may be asked to return on a different day to have your blood pressure checked again.  You may be asked to monitor your blood pressure at home   for 1 week or longer.  If you are diagnosed with hypertension, you may have other blood or imaging tests to help your health care provider understand your overall risk for other conditions. How is this treated? This condition is treated by making healthy lifestyle changes, such as eating healthy foods, exercising more, and reducing your alcohol intake. Your health care provider may prescribe  medicine if lifestyle changes are not enough to get your blood pressure under control, and if:  Your systolic blood pressure is above 130.  Your diastolic blood pressure is above 80.  Your personal target blood pressure may vary depending on your medical conditions, your age, and other factors. Follow these instructions at home: Eating and drinking  Eat a diet that is high in fiber and potassium, and low in sodium, added sugar, and fat. An example eating plan is called the DASH (Dietary Approaches to Stop Hypertension) diet. To eat this way: ? Eat plenty of fresh fruits and vegetables. Try to fill half of your plate at each meal with fruits and vegetables. ? Eat whole grains, such as whole wheat pasta, brown rice, or whole grain bread. Fill about one quarter of your plate with whole grains. ? Eat or drink low-fat dairy products, such as skim milk or low-fat yogurt. ? Avoid fatty cuts of meat, processed or cured meats, and poultry with skin. Fill about one quarter of your plate with lean proteins, such as fish, chicken without skin, beans, eggs, and tofu. ? Avoid premade and processed foods. These tend to be higher in sodium, added sugar, and fat.  Reduce your daily sodium intake. Most people with hypertension should eat less than 1,500 mg of sodium a day.  Limit alcohol intake to no more than 1 drink a day for nonpregnant women and 2 drinks a day for men. One drink equals 12 oz of beer, 5 oz of wine, or 1 oz of hard liquor. Lifestyle  Work with your health care provider to maintain a healthy body weight or to lose weight. Ask what an ideal weight is for you.  Get at least 30 minutes of exercise that causes your heart to beat faster (aerobic exercise) most days of the week. Activities may include walking, swimming, or biking.  Include exercise to strengthen your muscles (resistance exercise), such as pilates or lifting weights, as part of your weekly exercise routine. Try to do these types  of exercises for 30 minutes at least 3 days a week.  Do not use any products that contain nicotine or tobacco, such as cigarettes and e-cigarettes. If you need help quitting, ask your health care provider.  Monitor your blood pressure at home as told by your health care provider.  Keep all follow-up visits as told by your health care provider. This is important. Medicines  Take over-the-counter and prescription medicines only as told by your health care provider. Follow directions carefully. Blood pressure medicines must be taken as prescribed.  Do not skip doses of blood pressure medicine. Doing this puts you at risk for problems and can make the medicine less effective.  Ask your health care provider about side effects or reactions to medicines that you should watch for. Contact a health care provider if:  You think you are having a reaction to a medicine you are taking.  You have headaches that keep coming back (recurring).  You feel dizzy.  You have swelling in your ankles.  You have trouble with your vision. Get help right away if:  You develop a severe headache or confusion.  You have unusual weakness or numbness.  You feel faint.  You have severe pain in your chest or abdomen.  You vomit repeatedly.  You have trouble breathing. Summary  Hypertension is when the force of blood pumping through your arteries is too strong. If this condition is not controlled, it may put you at risk for serious complications.  Your personal target blood pressure may vary depending on your medical conditions, your age, and other factors. For most people, a normal blood pressure is less than 120/80.  Hypertension is treated with lifestyle changes, medicines, or a combination of both. Lifestyle changes include weight loss, eating a healthy, low-sodium diet, exercising more, and limiting alcohol. This information is not intended to replace advice given to you by your health care provider.  Make sure you discuss any questions you have with your health care provider. Document Released: 10/21/2005 Document Revised: 09/18/2016 Document Reviewed: 09/18/2016 Elsevier Interactive Patient Education  2018 [employer].    How to Take Your Blood Pressure   Blood pressure is a measurement of how strongly your blood is pressing against the walls of your arteries. Arteries are blood vessels that carry blood from your heart throughout your body. Your health care provider takes your blood pressure at each office visit. You can also take your own blood pressure at home with a blood pressure machine. You may need to take your own blood pressure:  To confirm a diagnosis of high blood pressure (hypertension).  To monitor your blood pressure over time.  To make sure your blood pressure medicine is working.  Supplies needed: To take your blood pressure, you will need a blood pressure machine. You can buy a blood pressure machine, or blood pressure monitor, at most drugstores or online. There are several types of home blood pressure monitors. When choosing one, consider the following:  Choose a monitor that has an arm cuff.  Choose a monitor that wraps snugly around your upper arm. You should be able to fit only one finger between your arm and the cuff.  Do not choose a monitor that measures your blood pressure from your wrist or finger.  Your health care provider can suggest a reliable monitor that will meet your needs. How to prepare To get the most accurate reading, avoid the following for 30 minutes before you check your blood pressure:  Drinking caffeine.  Drinking alcohol.  Eating.  Smoking.  Exercising.  Five minutes before you check your blood pressure:  Empty your bladder.  Sit quietly without talking in a dining chair, rather than in a soft couch or armchair.  How to take your blood pressure To check your blood pressure, follow the instructions in the manual that  came with your blood pressure monitor. If you have a digital blood pressure monitor, the instructions may be as follows: 1. Sit up straight. 2. Place your feet on the floor. Do not cross your ankles or legs. 3. Rest your left arm at the level of your heart on a table or desk or on the arm of a chair. 4. Pull up your shirt sleeve. 5. Wrap the blood pressure cuff around the upper part of your left arm, 1 inch (2.5 cm) above your elbow. It is best to wrap the cuff around bare skin. 6. Fit the cuff snugly around your arm. You should be able to place only one finger between the cuff and your arm. 7. Position the cord inside the  groove of your elbow. 8. Press the power button. 9. Sit quietly while the cuff inflates and deflates. 10. Read the digital reading on the monitor screen and write it down (record it). 11. Wait 2-3 minutes, then repeat the steps, starting at step 1.  What does my blood pressure reading mean? A blood pressure reading consists of a higher number over a lower number. Ideally, your blood pressure should be below 120/80. The first ("top") number is called the systolic pressure. It is a measure of the pressure in your arteries as your heart beats. The second ("bottom") number is called the diastolic pressure. It is a measure of the pressure in your arteries as the heart relaxes. Blood pressure is classified into four stages. The following are the stages for adults who do not have a short-term serious illness or a chronic condition. Systolic pressure and diastolic pressure are measured in a unit called mm Hg. Normal  Systolic pressure: below 563.  Diastolic pressure: below 80. Elevated  Systolic pressure: 893-734.  Diastolic pressure: below 80. Hypertension stage 1  Systolic pressure: 287-681.  Diastolic pressure: 15-72. Hypertension stage 2  Systolic pressure: 620 or above.  Diastolic pressure: 90 or above. You can have prehypertension or hypertension even if only the  systolic or only the diastolic number in your reading is higher than normal. Follow these instructions at home:  Check your blood pressure as often as recommended by your health care provider.  Take your monitor to the next appointment with your health care provider to make sure: ? That you are using it correctly. ? That it provides accurate readings.  Be sure you understand what your goal blood pressure numbers are.  Tell your health care provider if you are having any side effects from blood pressure medicine. Contact a health care provider if:  Your blood pressure is consistently high. Get help right away if:  Your systolic blood pressure is higher than 180.  Your diastolic blood pressure is higher than 110. This information is not intended to replace advice given to you by your health care provider. Make sure you discuss any questions you have with your health care provider. Document Released: 03/29/2016 Document Revised: 06/11/2016 Document Reviewed: 03/29/2016 Elsevier Interactive Patient Education  [employer].

## 2020-02-28 NOTE — Progress Notes (Signed)
Impression and Recommendations:    1. Hypertensive heart disease with heart failure (HCC)   2. Obesity, Class III, BMI 40-49.9 (morbid obesity) ([place])   3. Chronic atrial fibrillation ([place])   4. Chronic anticoagulation   5. Positive for microalbuminuria   6. Prediabetes   7. Vitamin D deficiency     - Last full lab work checked 03/08/2019    Chronic atrial fibrillation - Managed by Cardiology.   - Patient is stable and asymptomatic at this time. - Patient will continue management as established.  See med list.  - Will continue to monitor alongside specialist.   Chronic anticoagulation - Managed by Cardiology.  Stable at this time. - Patient will continue management as established.  See med list.  - Will continue to monitor alongside specialist.   Prediabetes - A1c was 5.7 last check six months ago, stable from 5.7 eleven months ago.  - Counseled patient on prevention of diabetes and discussed dietary and lifestyle modification as first line.    - Importance of low carb, heart-healthy diet discussed with patient in addition to regular aerobic exercise of 54min 5d/week or more.   - We will continue to monitor and re-check as discussed.   Hypertensive Heart Disease w/ Heart Failure - Treatment plan managed by Cardiology.  - BP last OV was 157/82. - Asked patient to check BP at home and follow closely with [place].  - Blood pressure currently is elevated on intake. Repeat WNL's 138/70- Per patient, BP stable and controlled at home.  - Patient will continue current treatment regimen.  See med list.  - Counseled patient on pathophysiology of disease and discussed various treatment options, which always includes dietary and lifestyle modification as first line.   - Lifestyle changes such as dash and heart healthy diets and engaging in a regular exercise program discussed extensively with patient.   - Ambulatory blood pressure monitoring encouraged at least 3 times  weekly.  Keep log and bring in every office visit.  Reminded patient that if they ever feel poorly in any way, to check their blood pressure and pulse.  - We will continue to monitor.   Kidney Function - Last checked 12/01/2019 during hospital follow-up. - Creatinine stable at that time at 0.92.  - To maintain kidney health, advised patient to hydrate adequately, engage in regular physical activity, keep his blood pressure controlled, and keep his blood sugar controlled.  - Will continue to monitor and re-check as discussed.   BMI Counseling - Obesity, Class III, BMI 40-49.9 (morbid obesity) Explained to patient what BMI refers to, and what it means medically.    Told patient to think about it as a "medical risk stratification measurement" and how increasing BMI is associated with increasing risk/ or worsening state of various diseases such as hypertension, hyperlipidemia, diabetes, premature OA, depression etc.  American Heart Association guidelines for healthy diet, basically Mediterranean diet, and exercise guidelines of 30 minutes 5 days per week or more discussed in detail.  - If desired, patient knows he may request further assistance with weight loss through a program such as [facility].  - Health counseling performed.  All questions answered.   Health Counseling & Preventative Maintenance - Advised patient to continue working toward exercising to improve overall mental, physical, and emotional health.    - Reviewed the "spokes of the wheel" of wellbeing.  Stressed the importance of ongoing prudent habits, including regular exercise, appropriate sleep hygiene, healthful dietary  habits, and prayer/meditation to relax.  - Encouraged patient to engage in daily physical activity as tolerated, especially a formal exercise routine.  Recommended that the patient eventually strive for at least 150 minutes of moderate cardiovascular activity per week according to guidelines  established by the [facility].   - Healthy dietary habits encouraged, including low-carb, and high amounts of lean protein in diet.   - Patient should also consume adequate amounts of water.   Please see AVS handed out to patient at the end of our visit for further patient instructions/ counseling done pertaining to today's office visit.   Pt denies need for med RF's today  Return for f/up 4 months with A1c and other labs as needed.     Note:  This note was prepared with assistance of Dragon voice recognition software. Occasional wrong-word or sound-a-like substitutions may have occurred due to the inherent limitations of voice recognition software.   The [place] was signed into law in 2016 which includes the topic of electronic health records.  This provides immediate access to information in MyChart.  This includes consultation notes, operative notes, office notes, lab results and pathology reports.  If you have any questions about what you read please let us know at your next visit or call us at the office.  We are right here with you.   This case required medical decision making of at least moderate complexity.  This document serves as a record of services personally performed by Mellody Dance, DO. It was created on her behalf by Toni Amend, a trained medical scribe. The creation of this record is based on the scribe's personal observations and the provider's statements to them.    The above documentation from Toni Amend, medical scribe, has been reviewed by Marjory Sneddon, D.O.      --------------------------------------------------------------------------------------------------------------------------------------------------------------------------------------------------------------------------------------------    Subjective:     Joel Dominguez, am serving as scribe for Dr.Shiana Rappleye.   HPI: Joel Dominguez is a 69 y.o. male who  presents to [place] [facility] today for issues as discussed below.  Denies concerns overall.  Tolerating his medications well and feeling good in general.  At home, engages in yard work and housework to stay active.  He's doing stuff in his garage, washing his truck, going to various sports games and such with the grandkids, etc.  He and his wife have obtained both of their COVID-19 vaccinations.  Overall "fine, I can't think of anything" to ask in terms of health concerns.  He is continuing to work on losing weight.  Notes he's been exactly the same weight since the beginning of the year.   - Hypertensive Heart Disease with Heart Failure He continues to follow up with cardiology as established.  Says occasionally he experiences SOB; "if I do a lot, I'll get SOB, and I'll sit down and relax."  Denies chest tightness.  Reports that he has heart palpitations every once in a while.  Denies concerns overall.  - Eye Health In the near future, notes he will be receiving shots in his left eye for ulcers.  "He said high blood pressure was doing it."  - Pain management He continues to follow up with Dr. Maryruth Eve of Pain Medicine.  HPI:  Hypertension:  -  His blood pressure at home has been running: 138-139/70-80 at the most.  States that he checked his blood pressure 5 out of 7 days this past week.  He has had his  wrist cuff checked with the cardiologist's office cuff.  - Patient reports good compliance with medication and/or lifestyle modification  - His denies acute concerns or problems related to treatment plan  - He denies new onset of: chest pain, exercise intolerance, shortness of breath, dizziness, visual changes, headache, lower extremity swelling or claudication.   Last 3 blood pressure readings in our office are as follows: BP Readings from Last 3 Encounters:  02/28/20 138/70  12/07/19 (!) 148/86  12/01/19 (!) 157/82   Filed Weights   02/28/20 1314  Weight:  (!) 325 lb 4.8 oz (147.6 kg)    Last A1C in the office was:  Lab Results  Component Value Date   HGBA1C 5.7 (H) 08/05/2019   HGBA1C 5.7 (H) 03/08/2019   HGBA1C 5.6 10/22/2017   Lab Results  Component Value Date   MICROALBUR 30 08/05/2019   [place] 92 03/08/2019   CREATININE 0.92 12/01/2019   BP Readings from Last 3 Encounters:  02/28/20 138/70  12/07/19 (!) 148/86  12/01/19 (!) 157/82   Wt Readings from Last 3 Encounters:  02/28/20 (!) 325 lb 4.8 oz (147.6 kg)  12/07/19 (!) 325 lb (147.4 kg)  12/01/19 (!) 331 lb 3.2 oz (150.2 kg)    Pulse Readings from Last 3 Encounters:  02/28/20 87  12/07/19 99  12/01/19 91   BMI Readings from Last 3 Encounters:  02/28/20 44.12 kg/m  12/07/19 44.08 kg/m  12/01/19 44.92 kg/m     Patient Care Team    Relationship Specialty Notifications Start End  Mellody Dance, DO PCP - General Family Medicine  10/08/16   Richardo Priest, MD PCP - Cardiology Cardiology  11/11/19   Lucia Bitter., MD Physician Assistant Pain Medicine  11/13/17    Comment: NP Darlin Coco, MD Consulting Physician Cardiology  11/13/17   Edrick Kins, DPM Consulting Physician Podiatry  02/23/19    Comment: provider:  Dr. Prudence Davidson      Patient Active Problem List   Diagnosis Date Noted  . Hypertensive heart disease with heart failure ([place]) 11/10/2017  . Sleep apnea with use of continuous positive airway pressure (CPAP) 10/08/2016  . Pulmonary hypertension ([place]) 10/08/2016  . Obesity, Class III, BMI 40-49.9 (morbid obesity) ([place]) 10/08/2016  . Positive for microalbuminuria 11/19/2017  . h/o Stroke 10/08/2016  . Atherosclerotic heart disease of native coronary artery with unspecified angina pectoris ([place]) 10/08/2016  . Chronic combined systolic and diastolic heart failure ([place]) 10/08/2016  . Chronic atrial fibrillation ([place]) 10/08/2016  . Chronic anticoagulation 10/08/2016  . Generalized OA 10/08/2016  . Foot drop- R 10/08/2016  . Lumbar  spinal stenosis 10/08/2016  . Long term current use of diuretic 12/01/2019  . Anemia 12/01/2019  . Heme positive stool 11/17/2019  . Physical debility 11/11/2019  . Acute blood loss anemia 11/05/2019  . Chronic congestive heart failure ([place])   . Longstanding persistent atrial fibrillation ([place])   . Chronic foot ulcer ([place]) 08/08/2019  . Prediabetes 08/08/2019  . Left leg weakness 08/08/2019  . High risk medication use 08/08/2019  . Chronic ulcer of toe ([place]) 08/04/2019  . Pain due to onychomycosis of toenails of both feet 04/30/2019  . Coagulation disorder ([place]) 04/30/2019  . Chronic bilateral low back pain with right-sided sciatica 03/03/2018  . Lumbosacral radiculopathy 01/22/2018  . Osteoarthritis 11/19/2017  . Vitamin D deficiency 11/19/2017  . 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 fascicular block) 11/07/2017  . Aortic stenosis 11/06/2017  . H/O  noncompliance with medical treatment, presenting hazards to health 10/22/2017    Past Medical history, Surgical history, Family history, Social history, Allergies and Medications have been entered into the medical record, reviewed and changed as needed.    Current Meds  Medication Sig  . apixaban (ELIQUIS) 5 MG TABS tablet Take 1 tablet (5 mg total) by mouth 2 (two) times daily.  . B Complex-C (B-COMPLEX WITH VITAMIN C) tablet Take 1 tablet by mouth daily.  . bumetanide (BUMEX) 1 MG tablet Take 1 tablet (1 mg total) by mouth daily. Take an additional 1 mg on M-W-F  . carvedilol (COREG) 12.5 MG tablet Take 1 tablet (12.5 mg total) by mouth 2 (two) times daily with a meal.  . hydrocerin (EUCERIN) CREA Apply 1 application topically 2 (two) times daily.  . Multiple Vitamin (MULTIVITAMIN) tablet Take 1 tablet by mouth daily.  . Vitamin D, Cholecalciferol, 50 MCG (2000 UT) CAPS Take 50 mcg by mouth daily.  . Vitamin D, Ergocalciferol, (DRISDOL) 1.25 MG (50000 UT) CAPS capsule TAKE 1 CAPSULE BY MOUTH EVERY 7 DAYS.     Allergies:  Allergies  Allergen Reactions  . Cephalexin Rash  . Dilaudid [Hydromorphone] Other (See Comments)    Hallucinations     Review of Systems:  A fourteen system review of systems was performed and found to be positive as per HPI.   Objective:   Blood pressure 138/70, pulse 87, temperature 97.7 F (36.5 C), temperature source Oral, height 6' (1.829 m), weight (!) 325 lb 4.8 oz (147.6 kg), SpO2 97 %. Body mass index is 44.12 kg/m. General:  Well Developed, well nourished, appropriate for stated age.  Neuro:  Alert and oriented,  extra-ocular muscles intact  HEENT:  Normocephalic, atraumatic, neck supple, no carotid bruits appreciated  Skin:  no gross rash, warm, pink. Cardiac:  RRR, S1 S2 Respiratory:  ECTA B/L and A/P, Not using accessory muscles, speaking in full sentences- unlabored. Vascular:  Ext warm, no cyanosis apprec.; cap RF less 2 sec. Psych:  No HI/SI, judgement and insight good, Euthymic mood. Full Affect.

## 2020-02-29 DIAGNOSIS — H34832 Tributary (branch) retinal vein occlusion, left eye, with macular edema: Secondary | ICD-10-CM | POA: Diagnosis not present

## 2020-03-04 ENCOUNTER — Inpatient Hospital Stay: Admission: RE | Admit: 2020-03-04 | Payer: Medicare Other | Admitting: Ophthalmology

## 2020-03-04 ENCOUNTER — Ambulatory Visit (HOSPITAL_COMMUNITY)
Admission: EM | Admit: 2020-03-04 | Discharge: 2020-03-05 | Disposition: A | Discharge status: Home / Self Care | Payer: Medicare Other | Attending: Ophthalmology | Admitting: Ophthalmology

## 2020-03-04 ENCOUNTER — Encounter (HOSPITAL_COMMUNITY): Admission: EM | Disposition: A | Discharge status: Home / Self Care | Payer: Self-pay | Source: Home / Self Care

## 2020-03-04 ENCOUNTER — Ambulatory Visit: Payer: Self-pay | Admitting: Ophthalmology

## 2020-03-04 ENCOUNTER — Encounter (HOSPITAL_COMMUNITY): Payer: Self-pay | Admitting: Ophthalmology

## 2020-03-04 DIAGNOSIS — Z20822 Contact with and (suspected) exposure to covid-19: Secondary | ICD-10-CM | POA: Diagnosis not present

## 2020-03-04 DIAGNOSIS — Z8673 Personal history of transient ischemic attack (TIA), and cerebral infarction without residual deficits: Secondary | ICD-10-CM | POA: Diagnosis not present

## 2020-03-04 DIAGNOSIS — H43392 Other vitreous opacities, left eye: Secondary | ICD-10-CM | POA: Diagnosis not present

## 2020-03-04 DIAGNOSIS — Z6841 Body Mass Index (BMI) 40.0 and over, adult: Secondary | ICD-10-CM | POA: Diagnosis not present

## 2020-03-04 DIAGNOSIS — G4733 Obstructive sleep apnea (adult) (pediatric): Secondary | ICD-10-CM | POA: Insufficient documentation

## 2020-03-04 DIAGNOSIS — H21542 Posterior synechiae (iris), left eye: Secondary | ICD-10-CM | POA: Diagnosis not present

## 2020-03-04 DIAGNOSIS — H44002 Unspecified purulent endophthalmitis, left eye: Secondary | ICD-10-CM | POA: Diagnosis not present

## 2020-03-04 DIAGNOSIS — H53452 Other localized visual field defect, left eye: Secondary | ICD-10-CM | POA: Diagnosis not present

## 2020-03-04 DIAGNOSIS — H5712 Ocular pain, left eye: Secondary | ICD-10-CM | POA: Diagnosis not present

## 2020-03-04 DIAGNOSIS — H53132 Sudden visual loss, left eye: Secondary | ICD-10-CM | POA: Diagnosis not present

## 2020-03-04 DIAGNOSIS — H44012 Panophthalmitis (acute), left eye: Secondary | ICD-10-CM | POA: Diagnosis not present

## 2020-03-04 HISTORY — PX: VITRECTOMY PARS PLANA 23 GAUGE FOR ENDOPHTHALMITIS: SHX6185

## 2020-03-04 SURGERY — VITRECTOMY PARS PLANA 23 GAUGE FOR ENDOPHTHALMITIS
Anesthesia: General | Laterality: Left

## 2020-03-04 MED ORDER — DEXAMETHASONE SODIUM PHOSPHATE 10 MG/ML IJ SOLN
INTRAMUSCULAR | Status: AC
  Filled 2020-03-04: qty 1

## 2020-03-04 MED ORDER — HYPROMELLOSE (GONIOSCOPIC) 2.5 % OP SOLN
OPHTHALMIC | Status: AC
  Filled 2020-03-04: qty 15

## 2020-03-04 MED ORDER — AMIKACIN INTRAVITREAL INJECTION 0.4 MG/0.1 ML: 0.4000 mg | INTRAOCULAR | Status: DC

## 2020-03-04 MED ORDER — EPINEPHRINE PF 1 MG/ML IJ SOLN
INTRAMUSCULAR | Status: AC
  Filled 2020-03-04: qty 1

## 2020-03-04 MED ORDER — AMIKACIN INTRAVITREAL INJECTION 0.4 MG/0.1 ML
0.4000 mg | INTRAOCULAR | Status: AC
  Administered 2020-03-05: 01:00:00 0.4 mg via INTRAVITREAL
  Filled 2020-03-04: qty 0

## 2020-03-04 MED ORDER — TOBRAMYCIN-DEXAMETHASONE 0.3-0.1 % OP OINT
TOPICAL_OINTMENT | OPHTHALMIC | Status: AC
  Filled 2020-03-04: qty 3.5

## 2020-03-04 MED ORDER — BSS PLUS IO SOLN
INTRAOCULAR | Status: AC
  Filled 2020-03-04: qty 500

## 2020-03-04 MED ORDER — BSS IO SOLN
INTRAOCULAR | Status: AC
  Filled 2020-03-04: qty 15

## 2020-03-04 MED ORDER — VANCOMYCIN INTRAVITREAL INJECTION 1 MG/0.1 ML: 1.0000 mg | INTRAOCULAR | Status: DC

## 2020-03-04 MED ORDER — VANCOMYCIN INTRAVITREAL INJECTION 1 MG/0.1 ML
1.0000 mg | INTRAOCULAR | Status: AC
  Administered 2020-03-05: 02:00:00 1 mg via INTRAVITREAL
  Filled 2020-03-04: qty 0

## 2020-03-04 SURGICAL SUPPLY — 48 items
APPLICATOR COTTON TIP 6 STRL (MISCELLANEOUS) ×1 IMPLANT
APPLICATOR COTTON TIP 6IN STRL (MISCELLANEOUS) ×3
BLADE MVR KNIFE 20G (BLADE) IMPLANT
CANNULA ANT CHAM MAIN (OPHTHALMIC RELATED) IMPLANT
CANNULA DUAL BORE 23G (CANNULA) IMPLANT
CANNULA DUALBORE 25G (CANNULA) ×3 IMPLANT
CANNULA VLV SOFT TIP 25GA (OPHTHALMIC) ×3 IMPLANT
CLOSURE STERI-STRIP 1/2X4 (GAUZE/BANDAGES/DRESSINGS) ×1
CLSR STERI-STRIP ANTIMIC 1/2X4 (GAUZE/BANDAGES/DRESSINGS) ×2 IMPLANT
COVER MAYO STAND STRL (DRAPES) ×3 IMPLANT
DRAPE HALF SHEET 40X57 (DRAPES) ×3 IMPLANT
DRAPE INCISE 51X51 W/FILM STRL (DRAPES) ×3 IMPLANT
DRAPE RETRACTOR (MISCELLANEOUS) ×3 IMPLANT
ERASER HMR WETFIELD 23G BP (MISCELLANEOUS) ×3 IMPLANT
FORCEPS ECKARDT ILM 25G SERR (OPHTHALMIC RELATED) IMPLANT
FORCEPS GRIESHABER ILM 25G A (INSTRUMENTS) ×3 IMPLANT
GLOVE SURG SYN 7.5  E (GLOVE) ×4
GLOVE SURG SYN 7.5 E (GLOVE) ×2 IMPLANT
GOWN STRL REUS W/ TWL LRG LVL3 (GOWN DISPOSABLE) ×1 IMPLANT
GOWN STRL REUS W/TWL LRG LVL3 (GOWN DISPOSABLE) ×2
KIT BASIN OR (CUSTOM PROCEDURE TRAY) ×3 IMPLANT
KIT TURNOVER KIT B (KITS) ×3 IMPLANT
LENS BIOM SUPER VIEW SET DISP (MISCELLANEOUS) ×3 IMPLANT
NEEDLE 18GX1X1/2 (RX/OR ONLY) (NEEDLE) ×3 IMPLANT
NEEDLE 25GX 5/8IN NON SAFETY (NEEDLE) ×3 IMPLANT
NEEDLE FILTER BLUNT 18X 1/2SAF (NEEDLE) ×4
NEEDLE FILTER BLUNT 18X1 1/2 (NEEDLE) ×2 IMPLANT
NEEDLE HYPO 25GX1X1/2 BEV (NEEDLE) IMPLANT
NEEDLE HYPO 30X.5 LL (NEEDLE) ×12 IMPLANT
NS IRRIG 1000ML POUR BTL (IV SOLUTION) ×3 IMPLANT
PACK VITRECTOMY CUSTOM (CUSTOM PROCEDURE TRAY) ×3 IMPLANT
PAD ARMBOARD 7.5X6 YLW CONV (MISCELLANEOUS) ×6 IMPLANT
PAK PIK VITRECTOMY CVS 25GA (OPHTHALMIC) ×3 IMPLANT
PROBE LASER ILLUM FLEX CVD 25G (OPHTHALMIC) ×3 IMPLANT
SCRAPER DIAMOND 25GA (OPHTHALMIC RELATED) ×3 IMPLANT
SHIELD EYE LENSE ONLY DISP (GAUZE/BANDAGES/DRESSINGS) ×3 IMPLANT
SOL ANTI FOG 6CC (MISCELLANEOUS) ×1 IMPLANT
SOLUTION ANTI FOG 6CC (MISCELLANEOUS) ×2
SPONGE GAUZE 2X2 8PLY STER LF (GAUZE/BANDAGES/DRESSINGS) ×1
SPONGE GAUZE 2X2 8PLY STRL LF (GAUZE/BANDAGES/DRESSINGS) ×2 IMPLANT
STOPCOCK 4 WAY LG BORE MALE ST (IV SETS) ×6 IMPLANT
SUT VICRYL 7 0 TG140 8 (SUTURE) ×3 IMPLANT
SYR 10ML LL (SYRINGE) ×3 IMPLANT
SYR 20ML LL LF (SYRINGE) ×3 IMPLANT
SYR 5ML LL (SYRINGE) ×3 IMPLANT
SYR TB 1ML LUER SLIP (SYRINGE) ×9 IMPLANT
TOWEL GREEN STERILE FF (TOWEL DISPOSABLE) ×3 IMPLANT
WATER STERILE IRR 1000ML POUR (IV SOLUTION) ×3 IMPLANT

## 2020-03-04 NOTE — H&P (Signed)
Date of examination:  03/04/20  Indication for surgery: endophthalmitis left eye  Pertinent past medical history:  Past Medical History:  Diagnosis Date  . A-fib 10/08/2016  . Aortic stenosis 11/06/2017  . Atherosclerotic heart disease of native coronary artery with unspecified angina pectoris ([place]) 10/08/2016  . Cellulitis 11/2019   right lower extremity  . CHF (congestive heart failure) ([place])   . Chronic anticoagulation 10/08/2016  . Foot drop- R 10/08/2016   Patient known foot drop for many years due to lumbar spinal stenosis.-Over the past couple of months symptoms have gotten worse.  He feels he is tripping more and cannot lift his foot up as much. Toes are curling and calluses on end of toes.    - Did see spinal surgeon many yrs ago.  Declined sx at that time.     . Generalized OA 10/08/2016   Ortho doc-  Dr. [facility] in [place]- both knees replaced 08, 09.     . H/O noncompliance with medical treatment, presenting hazards to health 10/22/2017  . Heart disease   . Hypertension   . Obesity, Class III, BMI 40-49.9 (morbid obesity) ([place]) 10/08/2016  . OSA (obstructive sleep apnea) 10/08/2016   Bi-PAP nitely- been 20+ yrs now  . Pulmonary hypertension ([place]) 10/08/2016  . Spinal stenosis, lumbar region, with neurogenic claudication 10/08/2016   Back specialist in [place]-    only surgeon he would use- pt declined referral here for back pain mgt    . Stroke [facility])     Pertinent ocular history:  Left eye vein occlusion  Pertinent family history:  Family History  Problem Relation Age of Onset  . Congestive Heart Failure Mother   . Hypertension Mother   . Diabetes Mother   . Cancer Father        lung  . Congestive Heart Failure Sister   . Hypertension Sister   . Hypertension Brother   . Hypertension Sister   . Diabetes Brother     General:  Healthy appearing patient in no distress.    Eyes:    Acuity  OS CF  External: Within normal limits   Anterior segment: 2+ cell    Fundus:  arterial occlusion   Impression: Endophthalmitis left eye  Plan: Pars plana vitrectomy with injection of intravitreal antibiotics  Jalene Mullet, MD

## 2020-03-04 NOTE — ED Notes (Signed)
A rainbow of blood tubes has been drawn and in main lab if needed.

## 2020-03-04 NOTE — ED Notes (Signed)
Patient here for covid swab before surgery with Dr Posey Pronto, opthalmology.

## 2020-03-05 ENCOUNTER — Emergency Department (HOSPITAL_COMMUNITY): Payer: Medicare Other | Admitting: Registered Nurse

## 2020-03-05 DIAGNOSIS — Z20822 Contact with and (suspected) exposure to covid-19: Secondary | ICD-10-CM | POA: Diagnosis not present

## 2020-03-05 DIAGNOSIS — I272 Pulmonary hypertension, unspecified: Secondary | ICD-10-CM | POA: Diagnosis not present

## 2020-03-05 DIAGNOSIS — Z8673 Personal history of transient ischemic attack (TIA), and cerebral infarction without residual deficits: Secondary | ICD-10-CM | POA: Diagnosis not present

## 2020-03-05 DIAGNOSIS — M48061 Spinal stenosis, lumbar region without neurogenic claudication: Secondary | ICD-10-CM | POA: Diagnosis not present

## 2020-03-05 DIAGNOSIS — H44002 Unspecified purulent endophthalmitis, left eye: Secondary | ICD-10-CM | POA: Diagnosis not present

## 2020-03-05 DIAGNOSIS — I350 Nonrheumatic aortic (valve) stenosis: Secondary | ICD-10-CM | POA: Diagnosis not present

## 2020-03-05 DIAGNOSIS — G4733 Obstructive sleep apnea (adult) (pediatric): Secondary | ICD-10-CM | POA: Diagnosis not present

## 2020-03-05 DIAGNOSIS — Z6841 Body Mass Index (BMI) 40.0 and over, adult: Secondary | ICD-10-CM | POA: Diagnosis not present

## 2020-03-05 LAB — GRAM STAIN

## 2020-03-05 LAB — RESPIRATORY PANEL BY RT PCR (FLU A&B, COVID)
Influenza A by PCR: NEGATIVE
Influenza B by PCR: NEGATIVE
SARS Coronavirus 2 by RT PCR: NEGATIVE

## 2020-03-05 MED ORDER — FENTANYL CITRATE (PF) 250 MCG/5ML IJ SOLN
INTRAMUSCULAR | Status: DC | PRN
  Administered 2020-03-05 (×2): 75 ug via INTRAVENOUS

## 2020-03-05 MED ORDER — DEXAMETHASONE SODIUM PHOSPHATE 10 MG/ML IJ SOLN
INTRAMUSCULAR | Status: DC | PRN
  Administered 2020-03-05: 5 mg via INTRAVENOUS

## 2020-03-05 MED ORDER — LIDOCAINE HCL 2 % IJ SOLN
INTRAMUSCULAR | Status: AC
  Filled 2020-03-05: qty 20

## 2020-03-05 MED ORDER — DEXAMETHASONE SODIUM PHOSPHATE 10 MG/ML IJ SOLN
INTRAMUSCULAR | Status: DC | PRN
  Administered 2020-03-05: 10 mg

## 2020-03-05 MED ORDER — ONDANSETRON HCL 4 MG/2ML IJ SOLN
INTRAMUSCULAR | Status: DC | PRN
  Administered 2020-03-05: 4 mg via INTRAVENOUS

## 2020-03-05 MED ORDER — BUPIVACAINE HCL (PF) 0.75 % IJ SOLN
INTRAMUSCULAR | Status: AC
  Filled 2020-03-05: qty 10

## 2020-03-05 MED ORDER — PHENYLEPHRINE HCL-NACL 10-0.9 MG/250ML-% IV SOLN
INTRAVENOUS | Status: DC | PRN
  Administered 2020-03-05: 25 ug/min via INTRAVENOUS

## 2020-03-05 MED ORDER — BSS PLUS IO SOLN
INTRAOCULAR | Status: DC | PRN
  Administered 2020-03-05: 1 via INTRAOCULAR

## 2020-03-05 MED ORDER — PHENYLEPHRINE 40 MCG/ML (10ML) SYRINGE FOR IV PUSH (FOR BLOOD PRESSURE SUPPORT)
PREFILLED_SYRINGE | INTRAVENOUS | Status: DC | PRN
  Administered 2020-03-05 (×2): 80 ug via INTRAVENOUS

## 2020-03-05 MED ORDER — BSS IO SOLN
INTRAOCULAR | Status: DC | PRN
  Administered 2020-03-05: 15 mL via INTRAOCULAR

## 2020-03-05 MED ORDER — HYALURONIDASE HUMAN 150 UNIT/ML IJ SOLN
INTRAMUSCULAR | Status: AC
  Filled 2020-03-05: qty 1

## 2020-03-05 MED ORDER — TOBRAMYCIN-DEXAMETHASONE 0.3-0.1 % OP OINT
TOPICAL_OINTMENT | OPHTHALMIC | Status: DC | PRN
  Administered 2020-03-05: 1 via OPHTHALMIC

## 2020-03-05 MED ORDER — EPINEPHRINE PF 1 MG/ML IJ SOLN
INTRAOCULAR | Status: DC | PRN
  Administered 2020-03-05: 02:00:00 .3 mL

## 2020-03-05 MED ORDER — ROCURONIUM BROMIDE 10 MG/ML (PF) SYRINGE
PREFILLED_SYRINGE | INTRAVENOUS | Status: DC | PRN
  Administered 2020-03-05: 10 mg via INTRAVENOUS
  Administered 2020-03-05: 60 mg via INTRAVENOUS

## 2020-03-05 MED ORDER — FENTANYL CITRATE (PF) 100 MCG/2ML IJ SOLN
25.0000 ug | INTRAMUSCULAR | Status: DC | PRN
  Administered 2020-03-05 (×2): 50 ug via INTRAVENOUS

## 2020-03-05 MED ORDER — PROPOFOL 10 MG/ML IV BOLUS
INTRAVENOUS | Status: DC | PRN
  Administered 2020-03-05: 200 mg via INTRAVENOUS

## 2020-03-05 MED ORDER — LIDOCAINE 2% (20 MG/ML) 5 ML SYRINGE
INTRAMUSCULAR | Status: DC | PRN
  Administered 2020-03-05: 60 mg via INTRAVENOUS

## 2020-03-05 MED ORDER — ACETAZOLAMIDE ER 500 MG PO CP12
500.0000 mg | ORAL_CAPSULE | Freq: Once | ORAL | Status: AC
  Administered 2020-03-05: 500 mg via ORAL
  Filled 2020-03-05: qty 1

## 2020-03-05 MED ORDER — FENTANYL CITRATE (PF) 100 MCG/2ML IJ SOLN
INTRAMUSCULAR | Status: AC
  Filled 2020-03-05: qty 2

## 2020-03-05 MED ORDER — HYPROMELLOSE (GONIOSCOPIC) 2.5 % OP SOLN
OPHTHALMIC | Status: DC | PRN
  Administered 2020-03-05: 1 [drp] via OPHTHALMIC

## 2020-03-05 MED ORDER — SUGAMMADEX SODIUM 200 MG/2ML IV SOLN
INTRAVENOUS | Status: DC | PRN
  Administered 2020-03-05: 350 mg via INTRAVENOUS

## 2020-03-05 MED ORDER — SUCCINYLCHOLINE CHLORIDE 200 MG/10ML IV SOSY
PREFILLED_SYRINGE | INTRAVENOUS | Status: DC | PRN
  Administered 2020-03-05: 120 mg via INTRAVENOUS

## 2020-03-05 MED ORDER — ATROPINE SULFATE 1 % OP SOLN
OPHTHALMIC | Status: AC
  Filled 2020-03-05: qty 5

## 2020-03-05 MED ORDER — LACTATED RINGERS IV SOLN: INTRAVENOUS | Status: DC | PRN

## 2020-03-05 NOTE — Anesthesia Postprocedure Evaluation (Signed)
Anesthesia Post Note  Patient: Joel Dominguez  Procedure(s) Performed: VITRECTOMY PARS PLANA 23 GAUGE FOR ENDOPHTHALMITIS (Left )     Patient location during evaluation: PACU Anesthesia Type: General Level of consciousness: awake Pain management: pain level controlled Vital Signs Assessment: post-procedure vital signs reviewed and stable Respiratory status: spontaneous breathing Cardiovascular status: stable Postop Assessment: no apparent nausea or vomiting Anesthetic complications: no    Last Vitals:  Vitals:   03/05/20 0245 03/05/20 0300  BP: (!) 150/78 (!) 154/91  Pulse: (!) 104   Resp: 20   Temp:    SpO2: 99%     Last Pain:  Vitals:   03/05/20 0245  PainSc: 9                  Trayvon Trumbull

## 2020-03-05 NOTE — Transfer of Care (Signed)
Immediate Anesthesia Transfer of Care Note  Patient: Joel Dominguez  Procedure(s) Performed: VITRECTOMY PARS PLANA 23 GAUGE FOR ENDOPHTHALMITIS (Left )  Patient Location: PACU  Anesthesia Type:General  Level of Consciousness: awake, alert  and oriented  Airway & Oxygen Therapy: Patient Spontanous Breathing and Patient connected to face mask oxygen  Post-op Assessment: Report given to RN and Post -op Vital signs reviewed and stable  Post vital signs: Reviewed and stable  Last Vitals:  Vitals Value Taken Time  BP 145/72   Temp    Pulse 104   Resp 16   SpO2 [id number]        Complications: No apparent anesthesia complications

## 2020-03-05 NOTE — Anesthesia Preprocedure Evaluation (Signed)
Anesthesia Evaluation  Patient identified by MRN, date of birth, ID band Patient awake    Reviewed: Allergy & Precautions, NPO status , Patient's Chart, lab work & pertinent test results  Airway Mallampati: II  TM Distance: >3 FB     Dental   Pulmonary    breath sounds clear to auscultation       Cardiovascular hypertension, + angina + CAD and +CHF  + dysrhythmias  Rhythm:Regular Rate:Normal     Neuro/Psych    GI/Hepatic negative GI ROS, Neg liver ROS,   Endo/Other  negative endocrine ROS  Renal/GU negative Renal ROS     Musculoskeletal  (+) Arthritis ,   Abdominal   Peds  Hematology   Anesthesia Other Findings   Reproductive/Obstetrics                             Anesthesia Physical Anesthesia Plan  ASA: III  Anesthesia Plan: General   Post-op Pain Management:    Induction:   PONV Risk Score and Plan: 2 and Ondansetron and Dexamethasone  Airway Management Planned:   Additional Equipment:   Intra-op Plan:   Post-operative Plan: Extubation in OR  Informed Consent:     Dental advisory given  Plan Discussed with: CRNA and Anesthesiologist  Anesthesia Plan Comments:         Anesthesia Quick Evaluation

## 2020-03-05 NOTE — Anesthesia Postprocedure Evaluation (Signed)
Anesthesia Post Note  Patient: Joel Dominguez  Procedure(s) Performed: VITRECTOMY PARS PLANA 23 GAUGE FOR ENDOPHTHALMITIS (Left )     Patient location during evaluation: PACU Anesthesia Type: General Level of consciousness: awake Pain management: pain level not controlled Vital Signs Assessment: post-procedure vital signs reviewed and stable Respiratory status: spontaneous breathing Cardiovascular status: stable Postop Assessment: no apparent nausea or vomiting Anesthetic complications: no    Last Vitals:  Vitals:   03/05/20 0300 03/05/20 0304  BP: (!) 154/91 (!) 154/91  Pulse:  (!) 104  Resp:  19  Temp:    SpO2:  97%    Last Pain:  Vitals:   03/05/20 0300  PainSc: 2                  Lacrisha Bielicki

## 2020-03-05 NOTE — Op Note (Signed)
Kamden Leaper 03/05/2020 Diagnosis: Endophthalmitis left eye  Procedure: injection of intravitreal antibiotics Operative Eye:  left eye  Surgeon: Royston Cowper Estimated Blood Loss: minimal Specimens for Pathology:  None Complications: none   The  patient was prepped and draped in the usual fashion for ocular surgery on the  left eye .  A lid speculum was placed.  Infusion line and trocar was placed at the 4 o'clock position approximately 3.5 mm from the surgical limbus.   The infusion line was allowed to run and then clamped when placed at the cannula opening. The line was inserted and secured to the drape with an adhesive strip.   Active trocars/cannula were placed at the 10 and 2 o'clock positions approximately 3.5 mm from the surgical limbus. The cannula was visualized in the vitreous cavity.  The light pipe and vitreous cutter were inserted into the vitreous cavity and a core vitrectomy was performed.  Vitreous was aspirated through a 3 cc syringe and sent for gram stain and culture.  The vasculature appeared inflamed and arterioles were ischemic in appearance.  Venous perfusion was noted.  Peripheral intraretinal hemorrhages were noted, greater in the area of the branch retinal vein occlusion.  Care taken to remove the vitreous up to the vitreous base for 360 degrees.  Intravitreal antibtiotics were placed (1mg  Vancomycin and 400 mcg Amikacin) after a partial air/fluid exchange was performed.  BSS was added to the eye to leave a 5% air fill.    The superior cannulas were sequentially removed with concommitant tamponade using a cotton tipped applicator and noted to be air tight.  The infusion line and trocar were removed and the sclerotomy was noted to be air tight with normal intraocular pressure by digital palpapation.  Subconjunctival injections of  Dexamethasone 4mg /88ml was placed in the infero-medial quadrant.   The speculum and drapes were removed and the eye was patched with  Polymixin/Bacitracin ophthalmic ointment. An eye shield was placed and the patient was transferred alert and conversant with stable vital signs to the post operative recovery area.  The patient tolerated the procedure well and no complications were noted.  Royston Cowper MD

## 2020-03-05 NOTE — Anesthesia Procedure Notes (Signed)
Procedure Name: Intubation Date/Time: 03/05/2020 1:19 AM Performed by: Jearld Pies, CRNA Pre-anesthesia Checklist: Patient identified, Emergency Drugs available, Suction available and Patient being monitored Patient Re-evaluated:Patient Re-evaluated prior to induction Oxygen Delivery Method: Circle System Utilized Preoxygenation: Pre-oxygenation with 100% oxygen Induction Type: IV induction, Rapid sequence and Cricoid Pressure applied Laryngoscope Size: Mac and 4 Grade View: Grade I Tube type: Oral Tube size: 7.5 mm Number of attempts: 1 Airway Equipment and Method: Stylet Placement Confirmation: ETT inserted through vocal cords under direct vision,  positive ETCO2 and breath sounds checked- equal and bilateral Secured at: 23 cm Tube secured with: Tape Dental Injury: Teeth and Oropharynx as per pre-operative assessment

## 2020-03-06 ENCOUNTER — Ambulatory Visit: Payer: Medicare Other | Admitting: Cardiology

## 2020-03-06 LAB — POCT I-STAT, CHEM 8
BUN: 35 mg/dL — ABNORMAL HIGH (ref 8–23)
Calcium, Ion: 1.18 mmol/L (ref 1.15–1.40)
Chloride: 102 mmol/L (ref 98–111)
Creatinine, Ser: 1.6 mg/dL — ABNORMAL HIGH (ref 0.61–1.24)
Glucose, Bld: 126 mg/dL — ABNORMAL HIGH (ref 70–99)
HCT: 41 % (ref 39.0–52.0)
Hemoglobin: 13.9 g/dL (ref 13.0–17.0)
Potassium: 3.7 mmol/L (ref 3.5–5.1)
Sodium: 138 mmol/L (ref 135–145)
TCO2: 27 mmol/L (ref 22–32)

## 2020-03-06 MED FILL — Vancomycin HCl For IV Soln 1 GM (Base Equivalent): INTRAOCULAR | Qty: 1 | Status: AC

## 2020-03-09 ENCOUNTER — Ambulatory Visit: Payer: Medicare Other | Admitting: Cardiology

## 2020-03-09 DIAGNOSIS — H44002 Unspecified purulent endophthalmitis, left eye: Secondary | ICD-10-CM | POA: Diagnosis not present

## 2020-03-10 LAB — AEROBIC/ANAEROBIC CULTURE W GRAM STAIN (SURGICAL/DEEP WOUND): Gram Stain: NONE SEEN

## 2020-03-26 LAB — CULTURE, FUNGUS WITHOUT SMEAR

## 2020-03-29 ENCOUNTER — Other Ambulatory Visit: Payer: Self-pay

## 2020-03-29 NOTE — Progress Notes (Signed)
Cardiology Office Note:    Date:  03/30/2020   ID:  Joel Dominguez, DOB 14-Sep-1951, MRN [id number]  PCP:  Lorrene Reid, PA-C  Cardiologist:  Shirlee More, MD    Referring MD: Mellody Dance, DO    ASSESSMENT:    1. Chronic atrial fibrillation ([place])   2. Chronic anticoagulation   3. Nonrheumatic aortic valve stenosis   4. 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 fascicular block)   5. Hypertensive heart disease with heart failure ([place])    PLAN:    In order of problems listed above:  1. Stable atrial fibrillation continue his current anticoagulant has had no bleeding complication on carvedilol for BP control and rate control. 2. Continue his current anticoagulant stroke risk 3. Recheck echocardiogram this unfortunate was given the impression is severe aortic stenosis and I doubt he will have progression rechecked in September 4. Stable on most recent EKG 5. Stable BP at target.  Continue his diuretics his wife tells me he is back on spironolactone dose was 25 mg daily recheck renal function potassium today   Next appointment: 6 months   Medication Adjustments/Labs and Tests Ordered: Current medicines are reviewed at length with the patient today.  Concerns regarding medicines are outlined above.  Orders Placed This Encounter  Procedures  . Basic Metabolic Panel (BMET)  . ECHOCARDIOGRAM COMPLETE   No orders of the defined types were placed in this encounter.   Chief Complaint  Patient presents with  . Follow-up  . Atrial Fibrillation  . Congestive Heart Failure  . Aortic Stenosis    History of Present Illness:    Joel Dominguez is a 69 y.o. male with a hx of chronic atrial fibrillation, mild aortic stenosis on echo 2016, heart failure, RBBB with LAHB, hypertension, anticoagulation and PAH last seen 92/92/2021. Compliance with diet, lifestyle and medications: Yes  Unfortunately have permanent visual loss When he had surgery he had the impression from  anesthesia and severe aortic valve disease I reviewed his records and when I first met him he had mild aortic stenosis on echocardiogram last summer and has not progressed.  I told him in my opinion he was not high risk for his  surgical procedure  From cardiac perspective is done well he is very attentive his weight does not change his sodium restriction compliant with his medications.  His last creatinine was elevated recheck today as he is back on spironolactone.  No edema orthopnea chest pain palpitation or syncope.  Echocardiogram 07/01/2019: Continued mild aortic stenosis normal ejection fraction 1. The left ventricle has low normal systolic function, with an ejection  fraction of 50-55%. The cavity size was normal. There is moderate  concentric left ventricular hypertrophy. Left ventricular diastolic  function could not be evaluated secondary to  atrial fibrillation. No evidence of left ventricular regional wall motion  abnormalities.  2. The right ventricle has normal systolic function. The cavity was  moderately enlarged. There is no increase in right ventricular wall  thickness.  3. Left atrial size was mildly dilated.  4. The mitral valve is degenerative. There is moderate mitral annular  calcification present. Mitral valve regurgitation is moderate by color  flow Doppler. No evidence of mitral valve stenosis.  5. The aortic valve is tricuspid. Mild thickening of the aortic valve.  Aortic valve regurgitation is mild by color flow Doppler. Mild stenosis of  the aortic valve  6. The ascending aorta and aortic root are normal in size and structure.   Past  Medical History:  Diagnosis Date  . A-fib 10/08/2016  . Aortic stenosis 11/06/2017  . Atherosclerotic heart disease of native coronary artery with unspecified angina pectoris ([place]) 10/08/2016  . Cellulitis 11/2019   right lower extremity  . CHF (congestive heart failure) ([place])   . Chronic anticoagulation 10/08/2016  . Foot  drop- R 10/08/2016   Patient known foot drop for many years due to lumbar spinal stenosis.-Over the past couple of months symptoms have gotten worse.  He feels he is tripping more and cannot lift his foot up as much. Toes are curling and calluses on end of toes.    - Did see spinal surgeon many yrs ago.  Declined sx at that time.     . Generalized OA 10/08/2016   Ortho doc-  Dr. [facility] in [place]- both knees replaced 08, 09.     . H/O noncompliance with medical treatment, presenting hazards to health 10/22/2017  . Heart disease   . Hypertension   . Obesity, Class III, BMI 40-49.9 (morbid obesity) ([place]) 10/08/2016  . OSA (obstructive sleep apnea) 10/08/2016   Bi-PAP nitely- been 20+ yrs now  . Pulmonary hypertension ([place]) 10/08/2016  . Spinal stenosis, lumbar region, with neurogenic claudication 10/08/2016   Back specialist in [place]-    only surgeon he would use- pt declined referral here for back pain mgt    . Stroke [facility])     Past Surgical History:  Procedure Laterality Date  . I & D EXTREMITY Right 10/30/2019   Procedure: IRRIGATION AND DEBRIDEMENT EXTREMITY, right leg possible wound vac placement;  Surgeon: Marchia Bond, MD;  Location: [facility];  Service: Orthopedics;  Laterality: Right;  . REPLACEMENT TOTAL KNEE Bilateral   . TIBIA FRACTURE SURGERY    . VITRECTOMY PARS PLANA 23 GAUGE FOR ENDOPHTHALMITIS Left 03/04/2020   Procedure: VITRECTOMY PARS PLANA 23 GAUGE FOR ENDOPHTHALMITIS;  Surgeon: Jalene Mullet, MD;  Location: [facility];  Service: Ophthalmology;  Laterality: Left;    Current Medications: Current Meds  Medication Sig  . apixaban (ELIQUIS) 5 MG TABS tablet Take 1 tablet (5 mg total) by mouth 2 (two) times daily.  . B Complex-C (B-COMPLEX WITH VITAMIN C) tablet Take 1 tablet by mouth daily.  . bumetanide (BUMEX) 1 MG tablet Take 1 tablet (1 mg total) by mouth daily. Take an additional 1 mg on M-W-F  . carvedilol (COREG) 12.5 MG tablet Take 1 tablet (12.5 mg total) by mouth 2 (two)  times daily with a meal.  . hydrocerin (EUCERIN) CREA Apply 1 application topically 2 (two) times daily.  Marland Kitchen HYDROcodone-acetaminophen (NORCO) 10-325 MG tablet Take 1 tablet by mouth every 8 (eight) hours as needed.  . Multiple Vitamin (MULTIVITAMIN) tablet Take 1 tablet by mouth daily.  . Vitamin D, Cholecalciferol, 50 MCG (2000 UT) CAPS Take 50 mcg by mouth daily.  . Vitamin D, Ergocalciferol, (DRISDOL) 1.25 MG (50000 UT) CAPS capsule TAKE 1 CAPSULE BY MOUTH EVERY 7 DAYS.     Allergies:   Cephalexin and Dilaudid [hydromorphone]   Social History   Socioeconomic History  . Marital status: Married    Spouse name: Not on file  . Number of children: Not on file  . Years of education: Not on file  . Highest education level: Not on file  Occupational History  . Not on file  Tobacco Use  . Smoking status: Never Smoker  . Smokeless tobacco: Never Used  Substance and Sexual Activity  . Alcohol use: Not Currently  . Drug use:  No  . Sexual activity: Not on file  Other Topics Concern  . Not on file  Social History Narrative  . Not on file   Social Determinants of Health   Financial Resource Strain:   . Difficulty of Paying Living Expenses:   Food Insecurity:   . Worried About Charity fundraiser in the Last Year:   . Arboriculturist in the Last Year:   Transportation Needs:   . Film/video editor (Medical):   Marland Kitchen Lack of Transportation (Non-Medical):   Physical Activity:   . Days of Exercise per Week:   . Minutes of Exercise per Session:   Stress:   . Feeling of Stress :   Social Connections:   . Frequency of Communication with Friends and Family:   . Frequency of Social Gatherings with Friends and Family:   . Attends Religious Services:   . Active Member of Clubs or Organizations:   . Attends Archivist Meetings:   Marland Kitchen Marital Status:      Family History: The patient's family history includes Cancer in his father; Congestive Heart Failure in his mother and  sister; Diabetes in his brother and mother; Hypertension in his brother, mother, sister, and sister. ROS:   Please see the history of present illness.    All other systems reviewed and are negative.  EKGs/Labs/Other Studies Reviewed:    The following studies were reviewed today:    Recent Labs: 11/01/2019: Magnesium 2.3 11/12/2019: ALT 16 12/01/2019: Platelets 469 03/05/2020: BUN 35; Creatinine, Ser 1.60; Hemoglobin 13.9; Potassium 3.7; Sodium 138  Recent Lipid Panel    Component Value Date/Time   CHOL 178 03/08/2019 1038   TRIG 251 (H) 03/08/2019 1038   HDL 36 (L) 03/08/2019 1038   CHOLHDL 4.9 03/08/2019 1038   CHOLHDL 4.3 10/08/2016 1144   VLDL 45 (H) 10/08/2016 1144   LDLCALC 92 03/08/2019 1038    Physical Exam:    VS:  BP 136/84   Pulse 80   Ht 6' (1.829 m)   Wt (!) 326 lb 3.2 oz (148 kg)   SpO2 96%   BMI 44.24 kg/m     Wt Readings from Last 3 Encounters:  03/30/20 (!) 326 lb 3.2 oz (148 kg)  02/28/20 (!) 325 lb 4.8 oz (147.6 kg)  12/07/19 (!) 325 lb (147.4 kg)     GEN:  Well nourished, well developed in no acute distress HEENT: Normal NECK: No JVD; No carotid bruits LYMPHATICS: No lymphadenopathy CARDIAC: RRR, no murmurs, rubs, gallops RESPIRATORY:  Clear to auscultation without rales, wheezing or rhonchi  ABDOMEN: Soft, non-tender, non-distended MUSCULOSKELETAL:  No edema; No deformity  SKIN: Warm and dry NEUROLOGIC:  Alert and oriented x 3 PSYCHIATRIC:  Normal affect    Signed, Shirlee More, MD  03/30/2020 11:39 AM    [place]

## 2020-03-30 ENCOUNTER — Ambulatory Visit (INDEPENDENT_AMBULATORY_CARE_PROVIDER_SITE_OTHER): Payer: Medicare Other | Admitting: Cardiology

## 2020-03-30 ENCOUNTER — Other Ambulatory Visit: Payer: Self-pay

## 2020-03-30 ENCOUNTER — Encounter: Payer: Self-pay | Admitting: Cardiology

## 2020-03-30 VITALS — BP 136/84 | HR 80 | Ht 72.0 in | Wt 326.2 lb

## 2020-03-30 DIAGNOSIS — I110 Hypertensive heart disease with heart failure: Secondary | ICD-10-CM

## 2020-03-30 DIAGNOSIS — Z7901 Long term (current) use of anticoagulants: Secondary | ICD-10-CM | POA: Diagnosis not present

## 2020-03-30 DIAGNOSIS — I350 Nonrheumatic aortic (valve) stenosis: Secondary | ICD-10-CM

## 2020-03-30 DIAGNOSIS — I482 Chronic atrial fibrillation, unspecified: Secondary | ICD-10-CM

## 2020-03-30 DIAGNOSIS — I452 Bifascicular block: Secondary | ICD-10-CM | POA: Diagnosis not present

## 2020-03-30 NOTE — Patient Instructions (Signed)
Medication Instructions:  Your physician recommends that you continue on your current medications as directed. Please refer to the Current Medication list given to you today.  *If you need a refill on your cardiac medications before your next appointment, please call your pharmacy*   Lab Work: Your physician recommends that you return for lab work in: TODAY BMP If you have labs (blood work) drawn today and your tests are completely normal, you will receive your results only by: Marland Kitchen MyChart Message (if you have MyChart) OR . A paper copy in the mail If you have any lab test that is abnormal or we need to change your treatment, we will call you to review the results.   Testing/Procedures: Your physician has requested that you have an echocardiogram. Echocardiography is a painless test that uses sound waves to create images of your heart. It provides your doctor with information about the size and shape of your heart and how well your heart's chambers and valves are working. This procedure takes approximately one hour. There are no restrictions for this procedure.     Follow-Up: At [facility], you and your health needs are our priority.  As part of our continuing mission to provide you with exceptional heart care, we have created designated Provider Care Teams.  These Care Teams include your primary Cardiologist (physician) and Advanced Practice Providers (APPs -  Physician Assistants and Nurse Practitioners) who all work together to provide you with the care you need, when you need it.  We recommend signing up for the patient portal called "MyChart".  Sign up information is provided on this After Visit Summary.  MyChart is used to connect with patients for Virtual Visits (Telemedicine).  Patients are able to view lab/test results, encounter notes, upcoming appointments, etc.  Non-urgent messages can be sent to your provider as well.   To learn more about what you can do with MyChart, go to  [email or web address].    Your next appointment:   6 month(s)  The format for your next appointment:   In Person  Provider:   Shirlee More, MD   Other Instructions

## 2020-03-31 LAB — BASIC METABOLIC PANEL
BUN/Creatinine Ratio: 17 (ref 10–24)
BUN: 20 mg/dL (ref 8–27)
CO2: 23 mmol/L (ref 20–29)
Calcium: 9.2 mg/dL (ref 8.6–10.2)
Chloride: 101 mmol/L (ref 96–106)
Creatinine, Ser: 1.15 mg/dL (ref 0.76–1.27)
GFR calc Af Amer: 75 mL/min/{1.73_m2} (ref >59)
GFR calc non Af Amer: 65 mL/min/{1.73_m2} (ref >59)
Glucose: 104 mg/dL — ABNORMAL HIGH (ref 65–99)
Potassium: 4.1 mmol/L (ref 3.5–5.2)
Sodium: 139 mmol/L (ref 134–144)

## 2020-04-04 ENCOUNTER — Other Ambulatory Visit: Payer: Self-pay

## 2020-04-04 ENCOUNTER — Encounter: Payer: Self-pay | Admitting: Emergency Medicine

## 2020-04-04 ENCOUNTER — Ambulatory Visit
Admission: EM | Admit: 2020-04-04 | Discharge: 2020-04-04 | Disposition: A | Discharge status: Home / Self Care | Payer: Medicare Other | Attending: Emergency Medicine | Admitting: Emergency Medicine

## 2020-04-04 DIAGNOSIS — J069 Acute upper respiratory infection, unspecified: Secondary | ICD-10-CM | POA: Diagnosis not present

## 2020-04-04 MED ORDER — FLUTICASONE PROPIONATE 50 MCG/ACT NA SUSP
1.0000 | Freq: Every day | NASAL | 0 refills | Status: DC
Start: 2020-04-04 — End: 2021-04-03

## 2020-04-04 MED ORDER — BENZONATATE 100 MG PO CAPS
100.0000 mg | ORAL_CAPSULE | Freq: Three times a day (TID) | ORAL | 0 refills | Status: DC
Start: 2020-04-04 — End: 2020-06-29

## 2020-04-04 MED ORDER — AYR SALINE NASAL NO-DRIP NA GEL: 1.0000 | Freq: Every day | NASAL | 0 refills | Status: DC

## 2020-04-04 NOTE — ED Provider Notes (Signed)
[facility]    CSN: [id number] Arrival date & time: 04/04/20  1047      History   Chief Complaint Chief Complaint  Patient presents with  . Nasal Congestion  . Cough    HPI Joel Dominguez is a 69 y.o. male with extensive medical history as outlined below including OSA (with BiPAP use), CAD, stroke, obesity presenting for 3-day course of nasal congestion, cough.  States he received second dose of Materna Covid vaccine 3/12.  Underwent Covid testing 3 weeks ago: Negative.  Denies known sick contacts.  Denies chest pain, difficulty breathing, lower extremity edema.  Does not have humidifier for BiPAP.  Does not like using intranasal medications.  Has taken Zyrtec, Mucinex with some relief.   Past Medical History:  Diagnosis Date  . A-fib 10/08/2016  . Aortic stenosis 11/06/2017  . Atherosclerotic heart disease of native coronary artery with unspecified angina pectoris ([place]) 10/08/2016  . Cellulitis 11/2019   right lower extremity  . CHF (congestive heart failure) ([place])   . Chronic anticoagulation 10/08/2016  . Foot drop- R 10/08/2016   Patient known foot drop for many years due to lumbar spinal stenosis.-Over the past couple of months symptoms have gotten worse.  He feels he is tripping more and cannot lift his foot up as much. Toes are curling and calluses on end of toes.    - Did see spinal surgeon many yrs ago.  Declined sx at that time.     . Generalized OA 10/08/2016   Ortho doc-  Dr. [facility] in [place]- both knees replaced 08, 09.     . H/O noncompliance with medical treatment, presenting hazards to health 10/22/2017  . Heart disease   . Hypertension   . Obesity, Class III, BMI 40-49.9 (morbid obesity) ([place]) 10/08/2016  . OSA (obstructive sleep apnea) 10/08/2016   Bi-PAP nitely- been 20+ yrs now  . Pulmonary hypertension ([place]) 10/08/2016  . Spinal stenosis, lumbar region, with neurogenic claudication 10/08/2016   Back specialist in [place]-    only surgeon he would use- pt  declined referral here for back pain mgt    . Stroke [facility])     Patient Active Problem List   Diagnosis Date Noted  . Long term current use of diuretic 12/01/2019  . Anemia 12/01/2019  . Heme positive stool 11/17/2019  . Physical debility 11/11/2019  . Acute blood loss anemia 11/05/2019  . Chronic congestive heart failure ([place])   . Longstanding persistent atrial fibrillation ([place])   . Chronic foot ulcer ([place]) 08/08/2019  . Prediabetes 08/08/2019  . Left leg weakness 08/08/2019  . High risk medication use 08/08/2019  . Chronic ulcer of toe ([place]) 08/04/2019  . Pain due to onychomycosis of toenails of both feet 04/30/2019  . Coagulation disorder ([place]) 04/30/2019  . Chronic bilateral low back pain with right-sided sciatica 03/03/2018  . Lumbosacral radiculopathy 01/22/2018  . Osteoarthritis 11/19/2017  . Vitamin D deficiency 11/19/2017  . Positive for microalbuminuria 11/19/2017  . Hypertensive heart disease with heart failure ([place]) 11/10/2017  . 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 fascicular block) 11/07/2017  . Aortic stenosis 11/06/2017  . H/O noncompliance with medical treatment, presenting hazards to health 10/22/2017  . h/o Stroke 10/08/2016  . Atherosclerotic heart disease of native coronary artery with unspecified angina pectoris ([place]) 10/08/2016  . Sleep apnea with use of continuous positive airway pressure (CPAP) 10/08/2016  . Chronic combined systolic and diastolic heart failure ([place]) 10/08/2016  . Generalized OA 10/08/2016  .  Foot drop- R 10/08/2016  . Lumbar spinal stenosis 10/08/2016  . Pulmonary hypertension ([place]) 10/08/2016  . Chronic atrial fibrillation ([place]) 10/08/2016  . Chronic anticoagulation 10/08/2016  . Obesity, Class III, BMI 40-49.9 (morbid obesity) ([place]) 10/08/2016    Past Surgical History:  Procedure Laterality Date  . I & D EXTREMITY Right 10/30/2019   Procedure: IRRIGATION AND DEBRIDEMENT EXTREMITY, right leg possible wound vac  placement;  Surgeon: Marchia Bond, MD;  Location: [place];  Service: Orthopedics;  Laterality: Right;  . REPLACEMENT TOTAL KNEE Bilateral   . TIBIA FRACTURE SURGERY    . VITRECTOMY PARS PLANA 23 GAUGE FOR ENDOPHTHALMITIS Left 03/04/2020   Procedure: VITRECTOMY PARS PLANA 23 GAUGE FOR ENDOPHTHALMITIS;  Surgeon: Jalene Mullet, MD;  Location: [facility];  Service: Ophthalmology;  Laterality: Left;       Home Medications    Prior to Admission medications   Medication Sig Start Date End Date Taking? Authorizing Provider  apixaban (ELIQUIS) 5 MG TABS tablet Take 1 tablet (5 mg total) by mouth 2 (two) times daily. 04/13/19   Richardo Priest, MD  [place] Saline Nasal No-Drip GEL Place 1 spray into the nose at bedtime. 04/04/20   Hall-Potvin, [place], PA-C  B Complex-C (B-COMPLEX WITH VITAMIN C) tablet Take 1 tablet by mouth daily. 11/17/19   Love, Ivan Anchors, PA-C  benzonatate (TESSALON) 100 MG capsule Take 1 capsule (100 mg total) by mouth every 8 (eight) hours. 04/04/20   Hall-Potvin, [place], PA-C  bumetanide (BUMEX) 1 MG tablet Take 1 tablet (1 mg total) by mouth daily. Take an additional 1 mg on M-W-F 12/07/19   Richardo Priest, MD  carvedilol (COREG) 12.5 MG tablet Take 1 tablet (12.5 mg total) by mouth 2 (two) times daily with a meal. 11/17/19   Love, Ivan Anchors, PA-C  fluticasone (FLONASE) 50 MCG/ACT nasal spray Place 1 spray into both nostrils daily. 04/04/20   Hall-Potvin, [place], PA-C  hydrocerin (EUCERIN) CREA Apply 1 application topically 2 (two) times daily. 11/17/19   Love, Ivan Anchors, PA-C  HYDROcodone-acetaminophen (NORCO) 10-325 MG tablet Take 1 tablet by mouth every 8 (eight) hours as needed. 02/28/20   [provider]  Multiple Vitamin (MULTIVITAMIN) tablet Take 1 tablet by mouth daily.    [provider]  Vitamin D, Cholecalciferol, 50 MCG (2000 UT) CAPS Take 50 mcg by mouth daily.    [provider]  Vitamin D, Ergocalciferol, (DRISDOL) 1.25 MG (50000 UT) CAPS capsule TAKE 1  CAPSULE BY MOUTH EVERY 7 DAYS. 03/19/19   Mellody Dance, DO    Family History Family History  Problem Relation Age of Onset  . Congestive Heart Failure Mother   . Hypertension Mother   . Diabetes Mother   . Cancer Father        lung  . Congestive Heart Failure Sister   . Hypertension Sister   . Hypertension Brother   . Hypertension Sister   . Diabetes Brother     Social History Social History   Tobacco Use  . Smoking status: Never Smoker  . Smokeless tobacco: Never Used  Substance Use Topics  . Alcohol use: Not Currently  . Drug use: No     Allergies   Cephalexin and Dilaudid [hydromorphone]   Review of Systems As per HPI   Physical Exam Triage Vital Signs ED Triage Vitals  Enc Vitals Group     BP 04/04/20 1054 138/74     Pulse Rate 04/04/20 1054 76     Resp 04/04/20 1054  16     Temp 04/04/20 1054 97.6 F (36.4 C)     Temp Source 04/04/20 1054 Oral     SpO2 04/04/20 1054 93 %     Weight --      Height --      Head Circumference --      Peak Flow --      Pain Score 04/04/20 1057 0     Pain Loc --      Pain Edu? --      Excl. in [place]? --    No data found.  Updated Vital Signs BP 138/74 (BP Location: Left Arm)   Pulse 76   Temp 97.6 F (36.4 C) (Oral)   Resp 16   SpO2 93%   Visual Acuity Right Eye Distance:   Left Eye Distance:   Bilateral Distance:    Right Eye Near:   Left Eye Near:    Bilateral Near:     Physical Exam Constitutional:      General: He is not in acute distress.    Appearance: He is obese. He is not toxic-appearing or diaphoretic.  HENT:     Head: Normocephalic and atraumatic.     Right Ear: Tympanic membrane, ear canal and external ear normal.     Left Ear: Tympanic membrane, ear canal and external ear normal.     Nose:     Comments: Lateral turbinate edema with mucosal pallor.  No mucous plug    Mouth/Throat:     Mouth: Mucous membranes are moist.     Pharynx: Oropharynx is clear.  Eyes:     General: No scleral  icterus.    Conjunctiva/sclera: Conjunctivae normal.     Pupils: Pupils are equal, round, and reactive to light.  Neck:     Comments: Trachea midline, negative JVD Cardiovascular:     Rate and Rhythm: Normal rate and regular rhythm.  Pulmonary:     Effort: Pulmonary effort is normal. No respiratory distress.     Breath sounds: No wheezing or rales.  Musculoskeletal:     Cervical back: Neck supple. No tenderness.  Lymphadenopathy:     Cervical: No cervical adenopathy.  Skin:    Capillary Refill: Capillary refill takes less than 2 seconds.     Coloration: Skin is not jaundiced or pale.     Findings: No rash.  Neurological:     Mental Status: He is alert and oriented to person, place, and time.      UC Treatments / Results  Labs (all labs ordered are listed, but only abnormal results are displayed) Labs Reviewed  NOVEL CORONAVIRUS, NAA    EKG   Radiology No results found.  Procedures Procedures (including critical care time)  Medications Ordered in UC Medications - No data to display  Initial Impression / Assessment and Plan / UC Course  I have reviewed the triage vital signs and the nursing notes.  Pertinent labs & imaging results that were available during my care of the patient were reviewed by me and considered in my medical decision making (see chart for details).     Patient afebrile, nontoxic, with SpO2 93%.  Covid PCR pending.  Patient to quarantine until results are back.  We will treat supportively as outlined below.  Return precautions discussed, patient verbalized understanding and is agreeable to plan. Final Clinical Impressions(s) / UC Diagnoses   Final diagnoses:  URI with cough and congestion     Discharge Instructions     Tessalon for  cough. Start flonase, atrovent nasal spray for nasal congestion/drainage. You can use over the counter nasal saline rinse such as neti pot for nasal congestion. Keep hydrated, your urine should be clear to pale  yellow in color. Tylenol/motrin for fever and pain. Monitor for any worsening of symptoms, chest pain, shortness of breath, wheezing, swelling of the throat, go to the emergency department for further evaluation needed.     ED Prescriptions    Medication Sig Dispense Auth. Provider   fluticasone (FLONASE) 50 MCG/ACT nasal spray Place 1 spray into both nostrils daily. 16 g Hall-Potvin, [place], PA-C   [place] Saline Nasal No-Drip GEL Place 1 spray into the nose at bedtime. 22 mL Hall-Potvin, [place], PA-C   benzonatate (TESSALON) 100 MG capsule Take 1 capsule (100 mg total) by mouth every 8 (eight) hours. 21 capsule Hall-Potvin, [place], PA-C     PDMP not reviewed this encounter.   Hall-Potvin, [place], [place] 04/04/20 1119

## 2020-04-04 NOTE — Discharge Instructions (Addendum)

## 2020-04-04 NOTE — ED Triage Notes (Signed)
Pt here for nasal congestion and cough x 3 days; pt sts vaccinated for covid and declines covid testing at present

## 2020-04-05 LAB — NOVEL CORONAVIRUS, NAA: SARS-CoV-2, NAA: NOT DETECTED

## 2020-04-05 LAB — SARS-COV-2, NAA 2 DAY TAT

## 2020-04-06 ENCOUNTER — Telehealth: Payer: Self-pay | Admitting: Emergency Medicine

## 2020-04-06 MED ORDER — DOXYCYCLINE HYCLATE 100 MG PO CAPS: 100.0000 mg | ORAL_CAPSULE | Freq: Two times a day (BID) | ORAL | 0 refills | Status: AC

## 2020-04-06 NOTE — Telephone Encounter (Signed)
Patient called stating he still feels poorly.  Has been coughing last night.  No hemoptysis, chest pain, fever.  Covid test was negative.  Given history of OSA, URI will send in doxycycline.  Confirm pharmacy with patient.  No additional questions at this time.  Return precautions discussed, patient verbalized understanding and is agreeable to plan.

## 2020-04-12 ENCOUNTER — Other Ambulatory Visit: Payer: Self-pay | Admitting: Family Medicine

## 2020-04-12 DIAGNOSIS — I272 Pulmonary hypertension, unspecified: Secondary | ICD-10-CM

## 2020-04-12 DIAGNOSIS — I5042 Chronic combined systolic (congestive) and diastolic (congestive) heart failure: Secondary | ICD-10-CM

## 2020-04-12 DIAGNOSIS — I110 Hypertensive heart disease with heart failure: Secondary | ICD-10-CM

## 2020-04-15 ENCOUNTER — Other Ambulatory Visit: Payer: Self-pay | Admitting: Family Medicine

## 2020-04-15 DIAGNOSIS — E559 Vitamin D deficiency, unspecified: Secondary | ICD-10-CM

## 2020-06-01 DIAGNOSIS — H30112 Disseminated chorioretinal inflammation of posterior pole, left eye: Secondary | ICD-10-CM | POA: Diagnosis not present

## 2020-06-01 DIAGNOSIS — H53142 Visual discomfort, left eye: Secondary | ICD-10-CM | POA: Diagnosis not present

## 2020-06-01 DIAGNOSIS — H35352 Cystoid macular degeneration, left eye: Secondary | ICD-10-CM | POA: Diagnosis not present

## 2020-06-01 DIAGNOSIS — H5712 Ocular pain, left eye: Secondary | ICD-10-CM | POA: Diagnosis not present

## 2020-06-20 ENCOUNTER — Other Ambulatory Visit: Payer: Self-pay

## 2020-06-20 ENCOUNTER — Encounter: Payer: Self-pay | Admitting: Podiatry

## 2020-06-20 ENCOUNTER — Other Ambulatory Visit: Payer: Self-pay | Admitting: Cardiology

## 2020-06-20 ENCOUNTER — Ambulatory Visit (INDEPENDENT_AMBULATORY_CARE_PROVIDER_SITE_OTHER): Payer: Medicare Other | Admitting: Podiatry

## 2020-06-20 DIAGNOSIS — M21371 Foot drop, right foot: Secondary | ICD-10-CM | POA: Diagnosis not present

## 2020-06-20 DIAGNOSIS — M79674 Pain in right toe(s): Secondary | ICD-10-CM

## 2020-06-20 DIAGNOSIS — M79675 Pain in left toe(s): Secondary | ICD-10-CM | POA: Diagnosis not present

## 2020-06-20 DIAGNOSIS — B351 Tinea unguium: Secondary | ICD-10-CM

## 2020-06-20 DIAGNOSIS — D689 Coagulation defect, unspecified: Secondary | ICD-10-CM | POA: Diagnosis not present

## 2020-06-20 DIAGNOSIS — I83015 Varicose veins of right lower extremity with ulcer other part of foot: Secondary | ICD-10-CM

## 2020-06-20 DIAGNOSIS — L97519 Non-pressure chronic ulcer of other part of right foot with unspecified severity: Secondary | ICD-10-CM

## 2020-06-20 NOTE — Progress Notes (Signed)
This patient returns to my office for at risk foot care.  This patient requires this care by a professional since this patient will be at risk due to having coagulation disorder, for which he takes eliquis.  This patient is unable to cut nails himself since the patient cannot reach his nails.These nails are painful walking and wearing shoes.  This patient presents for at risk foot care today. He presents to the office with his wife. Foot drop right foot.  General Appearance  Alert, conversant and in no acute stress.  Vascular  Dorsalis pedis and posterior tibial  pulses are palpable  bilaterally.  Capillary return is within normal limits  bilaterally. Temperature is within normal limits  bilaterally.  Neurologic  Senn-Weinstein monofilament wire test diminished  bilaterally. Muscle power within normal limits bilaterally.  Nails Thick disfigured discolored nails with subungual debris  from hallux to fifth toes bilaterally. No evidence of bacterial infection or drainage bilaterally.  Orthopedic  No limitations of motion  feet .  No crepitus or effusions noted.  No bony pathology or digital deformities noted. Foot drop right.  Skin  normotropic skin with no porokeratosis noted bilaterally.  No signs of infections or ulcers noted.     Onychomycosis  Pain in right toes  Pain in left toes  Consent was obtained for treatment procedures.   Mechanical debridement of nails 1-5  bilaterally performed with a nail nipper.  Filed with dremel without incident. Iatrogenic lesion second toenail left foot.  Cauterization second toe.   Return office visit   34 months.                  Told patient to return for periodic foot care and evaluation due to potential at risk complications.   Gardiner Barefoot DPM

## 2020-06-29 ENCOUNTER — Other Ambulatory Visit: Payer: Self-pay

## 2020-06-29 ENCOUNTER — Encounter: Payer: Self-pay | Admitting: Physician Assistant

## 2020-06-29 ENCOUNTER — Ambulatory Visit (INDEPENDENT_AMBULATORY_CARE_PROVIDER_SITE_OTHER): Payer: Medicare Other | Admitting: Physician Assistant

## 2020-06-29 VITALS — BP 138/72 | HR 85 | Ht 72.0 in | Wt 325.0 lb

## 2020-06-29 DIAGNOSIS — G4733 Obstructive sleep apnea (adult) (pediatric): Secondary | ICD-10-CM

## 2020-06-29 DIAGNOSIS — R7303 Prediabetes: Secondary | ICD-10-CM | POA: Diagnosis not present

## 2020-06-29 DIAGNOSIS — I110 Hypertensive heart disease with heart failure: Secondary | ICD-10-CM

## 2020-06-29 MED ORDER — SPIRONOLACTONE 25 MG PO TABS: 25.0000 mg | ORAL_TABLET | Freq: Every day | ORAL | 1 refills | Status: DC

## 2020-06-29 NOTE — Patient Instructions (Signed)

## 2020-06-29 NOTE — Progress Notes (Signed)
Established Patient Office Visit  Subjective:  Patient ID: Joel Dominguez, male    DOB: 1951/01/15  Age: 69 y.o. MRN: [id number]  CC:  Chief Complaint  Patient presents with   Prediabetes   Hypertension    HPI Joel Dominguez presents for follow-up on hypertension and prediabetes. Pt is accompanied by his wife. Pt reports he got notification his BiPAP machine is being recalled due to side effects from the foam and is inquiring about switching. He currently has [employer]ips dream station version. Pt's wife reports insurance will not pay for a new one and isn't eligible until a couple more months.  HTN: Pt denies new onset chest pain, palpitations, dizziness or lower extremity swelling. Taking medication as directed without side effects. Checks BP at home and readings usually range in 130s/70-80. Requesting refill of spironolactone.  Prediabetes: Denies increased thirst or urination.    Past Medical History:  Diagnosis Date   A-fib 10/08/2016   Aortic stenosis 11/06/2017   Atherosclerotic heart disease of native coronary artery with unspecified angina pectoris ([place]) 10/08/2016   Cellulitis 11/2019   right lower extremity   CHF (congestive heart failure) ([place])    Chronic anticoagulation 10/08/2016   Foot drop- R 10/08/2016   Patient known foot drop for many years due to lumbar spinal stenosis.-Over the past couple of months symptoms have gotten worse.  He feels he is tripping more and cannot lift his foot up as much. Toes are curling and calluses on end of toes.    - Did see spinal surgeon many yrs ago.  Declined sx at that time.      Generalized OA 10/08/2016   Ortho doc-  Dr. [facility] in [place]- both knees replaced 08, 09.      H/O noncompliance with medical treatment, presenting hazards to health 10/22/2017   Heart disease    Hypertension    Obesity, Class III, BMI 40-49.9 (morbid obesity) ([place]) 10/08/2016   OSA (obstructive sleep apnea) 10/08/2016   Bi-PAP nitely- been 20+  yrs now   Pulmonary hypertension ([place]) 10/08/2016   Spinal stenosis, lumbar region, with neurogenic claudication 10/08/2016   Back specialist in [place]-    only surgeon he would use- pt declined referral here for back pain mgt     Stroke [facility])     Past Surgical History:  Procedure Laterality Date   I & D EXTREMITY Right 10/30/2019   Procedure: IRRIGATION AND DEBRIDEMENT EXTREMITY, right leg possible wound vac placement;  Surgeon: Marchia Bond, MD;  Location: [place];  Service: Orthopedics;  Laterality: Right;   REPLACEMENT TOTAL KNEE Bilateral    TIBIA FRACTURE SURGERY     VITRECTOMY PARS PLANA 23 GAUGE FOR ENDOPHTHALMITIS Left 03/04/2020   Procedure: VITRECTOMY PARS PLANA 23 GAUGE FOR ENDOPHTHALMITIS;  Surgeon: Jalene Mullet, MD;  Location: [place];  Service: Ophthalmology;  Laterality: Left;    Family History  Problem Relation Age of Onset   Congestive Heart Failure Mother    Hypertension Mother    Diabetes Mother    Cancer Father        lung   Congestive Heart Failure Sister    Hypertension Sister    Hypertension Brother    Hypertension Sister    Diabetes Brother     Social History   Socioeconomic History   Marital status: Married    Spouse name: Not on file   Number of children: Not on file   Years of education: Not on file   Highest education level: Not  on file  Occupational History   Not on file  Tobacco Use   Smoking status: Never Smoker   Smokeless tobacco: Never Used  Vaping Use   Vaping Use: Never used  Substance and Sexual Activity   Alcohol use: Not Currently   Drug use: No   Sexual activity: Not on file  Other Topics Concern   Not on file  Social History Narrative   Not on file   Social Determinants of Health   Financial Resource Strain:    Difficulty of Paying Living Expenses: Not on file  Food Insecurity:    Worried About [place] in the Last Year: Not on file   Ran Out of Food in the Last Year: Not on file   Transportation Needs:    Lack of Transportation (Medical): Not on file   Lack of Transportation (Non-Medical): Not on file  Physical Activity:    Days of Exercise per Week: Not on file   Minutes of Exercise per Session: Not on file  Stress:    Feeling of Stress : Not on file  Social Connections:    Frequency of Communication with Friends and Family: Not on file   Frequency of Social Gatherings with Friends and Family: Not on file   Attends Religious Services: Not on file   Active Member of Clubs or Organizations: Not on file   Attends Archivist Meetings: Not on file   Marital Status: Not on file  Intimate Partner Violence:    Fear of Current or Ex-Partner: Not on file   Emotionally Abused: Not on file   Physically Abused: Not on file   Sexually Abused: Not on file    Outpatient Medications Prior to Visit  Medication Sig Dispense Refill   apixaban (ELIQUIS) 5 MG TABS tablet Take 1 tablet (5 mg total) by mouth 2 (two) times daily. 180 tablet 3   [place] Saline Nasal No-Drip GEL Place 1 spray into the nose at bedtime. 22 mL 0   B Complex-C (B-COMPLEX WITH VITAMIN C) tablet Take 1 tablet by mouth daily. 30 tablet 0   bumetanide (BUMEX) 1 MG tablet TAKE 1 TABLET BY MOUTH DAILY. TAKE AN ADDITIONAL 1 TABLET ON MONDAY, WEDNESDAY, AND FRIDAY. 90 tablet 1   carvedilol (COREG) 12.5 MG tablet Take 1 tablet (12.5 mg total) by mouth 2 (two) times daily with a meal. 60 tablet 0   fluticasone (FLONASE) 50 MCG/ACT nasal spray Place 1 spray into both nostrils daily. 16 g 0   hydrocerin (EUCERIN) CREA Apply 1 application topically 2 (two) times daily. 454 g 0   HYDROcodone-acetaminophen (NORCO) 10-325 MG tablet Take 1 tablet by mouth every 8 (eight) hours as needed.     Multiple Vitamin (MULTIVITAMIN) tablet Take 1 tablet by mouth daily.     Vitamin D, Cholecalciferol, 50 MCG (2000 UT) CAPS Take 50 mcg by mouth daily.     Vitamin D, Ergocalciferol, (DRISDOL) 1.25 MG  (50000 UT) CAPS capsule TAKE 1 CAPSULE BY MOUTH EVERY 7 DAYS. 12 capsule 10   spironolactone (ALDACTONE) 25 MG tablet Take 25 mg by mouth daily.     benzonatate (TESSALON) 100 MG capsule Take 1 capsule (100 mg total) by mouth every 8 (eight) hours. (Patient not taking: Reported on 06/29/2020) 21 capsule 0   No facility-administered medications prior to visit.    Allergies  Allergen Reactions   Cephalexin Rash   Dilaudid [Hydromorphone] Other (See Comments)    Hallucinations    ROS Review  of Systems A fourteen system review of systems was performed and found to be positive as per HPI.  Objective:    Physical Exam General:  Well Developed, well nourished, appropriate for stated age.  Neuro:  Alert and oriented,  extra-ocular muscles intact  HEENT:  Normocephalic, atraumatic, neck supple, no JVD Skin:  no gross rash, warm, pink. Cardiac:  RRR, S1 S2, no murmur Respiratory:  ECTA B/L and A/P, Not using accessory muscles, speaking in full sentences- unlabored. Vascular:  Ext warm, no cyanosis apprec.; cap RF less 2 sec. Psych:  No HI/SI, judgement and insight good, Euthymic mood. Full Affect.  BP 138/72    Pulse 85    Ht 6' (1.829 m)    Wt (!) 325 lb (147.4 kg)    SpO2 97%    BMI 44.08 kg/m  Wt Readings from Last 3 Encounters:  06/29/20 (!) 325 lb (147.4 kg)  03/30/20 (!) 326 lb 3.2 oz (148 kg)  02/28/20 (!) 325 lb 4.8 oz (147.6 kg)     Health Maintenance Due  Topic Date Due   Hepatitis C Screening  Never done   COVID-19 Vaccine (1) Never done   TETANUS/TDAP  Never done   PNA vac Low Risk Adult (2 of 2 - PPSV23) 05/06/2016   INFLUENZA VACCINE  06/04/2020    There are no preventive care reminders to display for this patient.  Lab Results  Component Value Date   TSH 0.800 03/08/2019   Lab Results  Component Value Date   WBC 9.4 12/01/2019   HGB 13.9 03/05/2020   HCT 41.0 03/05/2020   MCV 84 12/01/2019   PLT 469 (H) 12/01/2019   Lab Results  Component  Value Date   NA 139 03/30/2020   K 4.1 03/30/2020   CO2 23 03/30/2020   GLUCOSE 104 (H) 03/30/2020   BUN 20 03/30/2020   CREATININE 1.15 03/30/2020   BILITOT 0.4 11/12/2019   ALKPHOS 60 11/12/2019   AST 15 11/12/2019   ALT 16 11/12/2019   PROT 7.8 11/12/2019   ALBUMIN 2.6 (L) 11/12/2019   CALCIUM 9.2 03/30/2020   ANIONGAP 7 11/15/2019   Lab Results  Component Value Date   CHOL 178 03/08/2019   Lab Results  Component Value Date   HDL 36 (L) 03/08/2019   Lab Results  Component Value Date   LDLCALC 92 03/08/2019   Lab Results  Component Value Date   TRIG 251 (H) 03/08/2019   Lab Results  Component Value Date   CHOLHDL 4.9 03/08/2019   Lab Results  Component Value Date   HGBA1C 5.7 (H) 08/05/2019      Assessment & Plan:   Problem List Items Addressed This Visit      Cardiovascular and Mediastinum   Hypertensive heart disease with heart failure (HCC) - Primary   Relevant Medications   spironolactone (ALDACTONE) 25 MG tablet     Other   Prediabetes    Other Visit Diagnoses    OSA (obstructive sleep apnea)         Hypertensive heart disease with heart failure: -Followed by Cardiology. -BP stable -Continue current medication therapy. Provided requested refill. -Follow low sodium diet. -Last BMP stable  Prediabetes: -Last A1c 5.7, stable -Follow low carbohydrate and glucose diet. -Will continue to monitor.  OSA: -Pt declined referral to sleep specialist to discuss alternative options for sleep apnea. -Discussed with patient will notify of alternatives, if available. Will contact DME/[employer].   Meds ordered this encounter  Medications  spironolactone (ALDACTONE) 25 MG tablet    Sig: Take 1 tablet (25 mg total) by mouth daily.    Dispense:  90 tablet    Refill:  1    Order Specific Question:   Supervising Provider    Answer:   Beatrice Lecher D [[id number]]    Follow-up: Return for MCW and FBW few days prior to OV in 3-4 months .   Note:   This note was prepared with assistance of Dragon voice recognition software. Occasional wrong-word or sound-a-like substitutions may have occurred due to the inherent limitations of voice recognition software.  Lorrene Reid, PA-C

## 2020-07-05 ENCOUNTER — Other Ambulatory Visit: Payer: Self-pay

## 2020-07-05 ENCOUNTER — Ambulatory Visit (INDEPENDENT_AMBULATORY_CARE_PROVIDER_SITE_OTHER): Payer: Medicare Other

## 2020-07-05 ENCOUNTER — Telehealth: Payer: Self-pay | Admitting: Physician Assistant

## 2020-07-05 DIAGNOSIS — I350 Nonrheumatic aortic (valve) stenosis: Secondary | ICD-10-CM

## 2020-07-05 LAB — ECHOCARDIOGRAM COMPLETE
AR max vel: 1.73 cm2
AV Area VTI: 1.49 cm2
AV Area mean vel: 1.65 cm2
AV Mean grad: 10.3 mmHg
AV Peak grad: 17.3 mmHg
Ao pk vel: 2.08 m/s
Area-P 1/2: 3.56 cm2
Calc EF: 49.2 %
P 1/2 time: 430 msec
S' Lateral: 3.6 cm
Single Plane A2C EF: 46.3 %
Single Plane A4C EF: 49.3 %

## 2020-07-05 NOTE — Progress Notes (Signed)
Complete echocardiogram has been performed.  Jimmy Sanyla Summey RDCS, RVT 

## 2020-07-05 NOTE — Telephone Encounter (Signed)
Call DME company to figure out what bipap machine is replacing the phillips dreamsation machine.

## 2020-07-06 NOTE — Telephone Encounter (Signed)
Left message with [facility] store to request below information. AS, CMA

## 2020-07-11 NOTE — Telephone Encounter (Signed)
I have called 2 DME stores that say they do not carry the bypap machines.   I have called [employer] and [employer] and they are out of bypap machines.   I have contacted patient and suggested he call his insurance company and request a replacement since the bypap machine was recalled. Patient states they have done this and insurance will not replace.   Please advise what to do since we can not find replacement bypap and insurance wont cover and patient is unable to pay out of pocket. AS, CMA

## 2020-07-13 NOTE — Telephone Encounter (Signed)
During OV patient's wife stated he will be eligible for a new machine in a few months that will be covered by Medicare and recommend to wait until then since other options cannot be pursued.   Thank you, Herb Grays

## 2020-07-14 NOTE — Telephone Encounter (Signed)
Patient is aware of below and verbalized understanding. AS, CMA

## 2020-07-31 ENCOUNTER — Other Ambulatory Visit: Payer: Self-pay | Admitting: Cardiology

## 2020-08-09 ENCOUNTER — Telehealth: Payer: Self-pay | Admitting: Physician Assistant

## 2020-08-09 DIAGNOSIS — E559 Vitamin D deficiency, unspecified: Secondary | ICD-10-CM

## 2020-08-09 MED ORDER — VITAMIN D (ERGOCALCIFEROL) 1.25 MG (50000 UNIT) PO CAPS: 50000.0000 [IU] | ORAL_CAPSULE | ORAL | 1 refills | Status: DC

## 2020-08-09 NOTE — Addendum Note (Signed)
Addended by: Mickel Crow on: 08/09/2020 10:00 AM   Modules accepted: Orders

## 2020-08-09 NOTE — Telephone Encounter (Signed)
Refill sent to pharmacy. AS, CMA 

## 2020-08-09 NOTE — Telephone Encounter (Signed)
Patient is requesting a refill of his Vit D, if approved please send to [facility].

## 2020-08-28 DIAGNOSIS — H30112 Disseminated chorioretinal inflammation of posterior pole, left eye: Secondary | ICD-10-CM | POA: Diagnosis not present

## 2020-08-28 DIAGNOSIS — H35352 Cystoid macular degeneration, left eye: Secondary | ICD-10-CM | POA: Diagnosis not present

## 2020-08-28 DIAGNOSIS — H54415A Blindness right eye category 5, normal vision left eye: Secondary | ICD-10-CM | POA: Diagnosis not present

## 2020-08-28 DIAGNOSIS — H35031 Hypertensive retinopathy, right eye: Secondary | ICD-10-CM | POA: Diagnosis not present

## 2020-09-07 ENCOUNTER — Other Ambulatory Visit: Payer: Self-pay

## 2020-09-07 ENCOUNTER — Ambulatory Visit (INDEPENDENT_AMBULATORY_CARE_PROVIDER_SITE_OTHER): Payer: Medicare Other

## 2020-09-07 DIAGNOSIS — Z23 Encounter for immunization: Secondary | ICD-10-CM | POA: Diagnosis not present

## 2020-09-22 DIAGNOSIS — I509 Heart failure, unspecified: Secondary | ICD-10-CM | POA: Insufficient documentation

## 2020-09-22 DIAGNOSIS — I10 Essential (primary) hypertension: Secondary | ICD-10-CM | POA: Insufficient documentation

## 2020-09-22 DIAGNOSIS — I519 Heart disease, unspecified: Secondary | ICD-10-CM | POA: Insufficient documentation

## 2020-10-01 NOTE — Progress Notes (Signed)
Cardiology Office Note:    Date:  10/02/2020   ID:  Joel Dominguez, DOB May 04, 1951, MRN [id number]  PCP:  Lorrene Reid, PA-C  Cardiologist:  Shirlee More, MD    Referring MD: Lorrene Reid, PA-C    ASSESSMENT:    1. Chronic atrial fibrillation ([place])   2. Chronic anticoagulation   3. Nonrheumatic aortic valve stenosis   4. Hypertensive heart disease with heart failure ([place])    PLAN:    In order of problems listed above:  1. He has done well rate is controlled continue beta-blocker anticoagulant.  Check CBC on chronic anticoagulant therapy 2. Stable aortic stenosis is mild recheck an echocardiogram likely in 2 years 3. Stable continue current antihypertensives carvedilol along with his diuretics Bumex and spironolactone.   Next appointment: 6 months   Medication Adjustments/Labs and Tests Ordered: Current medicines are reviewed at length with the patient today.  Concerns regarding medicines are outlined above.  No orders of the defined types were placed in this encounter.  No orders of the defined types were placed in this encounter.   Chief Complaint  Patient presents with  . Follow-up  . Congestive Heart Failure  . Atrial Fibrillation  . Anticoagulation    History of Present Illness:    Joel Dominguez is a 69 y.o. male with a hx of permanent atrial fibrillation mild aortic stenosis on echocardiogram 2016 chronic heart failure with previous ejection fraction 50 to 55% right bundle branch block left anterior hemiblock hypertensive heart disease and chronic anticoagulation.  He was last seen 03/30/2020.  Compliance with diet, lifestyle and medications: Yes  His wife is present participates in evaluation medical decision-making I reviewed his echocardiogram with the patient. He continues to do very well meticulous with sodium intake compliant with medications and has not had trouble with edema shortness of breath chest pain palpitations syncope.  No bleeding with  his anticoagulant.  Echo cardiogram 07/05/2020 shows EF 45 to 50% global hypokinesia with mild aortic stenosis mean gradient 10 mmHg and mild enlargement of the ascending aorta 40 mm.  Calculated EF 51% I reviewed the images myself I would have read ejection fraction is low normal 50 to 55% unchanged from the last echocardiogram 2020.  Past Medical History:  Diagnosis Date  . A-fib 10/08/2016  . Aortic stenosis 11/06/2017  . Atherosclerotic heart disease of native coronary artery with unspecified angina pectoris ([place]) 10/08/2016  . Cellulitis 11/2019   right lower extremity  . CHF (congestive heart failure) ([place])   . Chronic anticoagulation 10/08/2016  . Chronic combined systolic and diastolic heart failure ([place]) 10/08/2016   EF range 35-45% in 2016  . Chronic congestive heart failure ([place])   . Chronic ulcer of toe ([place]) 08/04/2019  . Coagulation disorder ([place]) 04/30/2019  . Foot drop- R 10/08/2016   Patient known foot drop for many years due to lumbar spinal stenosis.-Over the past couple of months symptoms have gotten worse.  He feels he is tripping more and cannot lift his foot up as much. Toes are curling and calluses on end of toes.    - Did see spinal surgeon many yrs ago.  Declined sx at that time.     . Generalized OA 10/08/2016   Ortho doc-  [facility]. [facility] in [place]- both knees replaced 08, 09.     . H/O noncompliance with medical treatment, presenting hazards to health 10/22/2017  . Heart disease   . Hypertension   . Hypertensive heart disease with heart failure ([place])  11/10/2017  . Longstanding persistent atrial fibrillation ([place])   . Obesity, Class III, BMI 40-49.9 (morbid obesity) ([place]) 10/08/2016  . OSA (obstructive sleep apnea) 10/08/2016   Bi-PAP nitely- been 20+ yrs now  . Osteoarthritis 11/19/2017  . Pulmonary hypertension ([place]) 10/08/2016  . 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 fascicular block) 11/07/2017  . Sleep apnea with use of continuous positive airway pressure  (CPAP) 10/08/2016   Bi-PAP nitely- been 20+ yrs now  . Spinal stenosis, lumbar region, with neurogenic claudication 10/08/2016   Back specialist in [place]-    only surgeon he would use- pt declined referral here for back pain mgt    . Stroke [facility])     Past Surgical History:  Procedure Laterality Date  . I & D EXTREMITY Right 10/30/2019   Procedure: IRRIGATION AND DEBRIDEMENT EXTREMITY, right leg possible wound vac placement;  Surgeon: Marchia Bond, MD;  Location: [place];  Service: Orthopedics;  Laterality: Right;  . REPLACEMENT TOTAL KNEE Bilateral   . TIBIA FRACTURE SURGERY    . VITRECTOMY PARS PLANA 23 GAUGE FOR ENDOPHTHALMITIS Left 03/04/2020   Procedure: VITRECTOMY PARS PLANA 23 GAUGE FOR ENDOPHTHALMITIS;  Surgeon: Jalene Mullet, MD;  Location: [place];  Service: Ophthalmology;  Laterality: Left;    Current Medications: Current Meds  Medication Sig  . B Complex-C (B-COMPLEX WITH VITAMIN C) tablet Take 1 tablet by mouth daily.  . bumetanide (BUMEX) 1 MG tablet TAKE 1 TABLET BY MOUTH DAILY. TAKE AN ADDITIONAL 1 TABLET ON MONDAY, WEDNESDAY, AND FRIDAY.  . carvedilol (COREG) 12.5 MG tablet Take 1 tablet (12.5 mg total) by mouth 2 (two) times daily with a meal.  . ELIQUIS 5 MG TABS tablet TAKE 1 TABLET BY MOUTH 2 TIMES DAILY.  . fluticasone (FLONASE) 50 MCG/ACT nasal spray Place 1 spray into both nostrils daily.  . hydrocerin (EUCERIN) CREA Apply 1 application topically 2 (two) times daily.  Marland Kitchen HYDROcodone-acetaminophen (NORCO) 10-325 MG tablet Take 1 tablet by mouth every 8 (eight) hours as needed.  . Multiple Vitamin (MULTIVITAMIN) tablet Take 1 tablet by mouth daily.  Marland Kitchen spironolactone (ALDACTONE) 25 MG tablet Take 1 tablet (25 mg total) by mouth daily.  . Vitamin D, Cholecalciferol, 50 MCG (2000 UT) CAPS Take 50 mcg by mouth daily.  . Vitamin D, Ergocalciferol, (DRISDOL) 1.25 MG (50000 UNIT) CAPS capsule Take 1 capsule (50,000 Units total) by mouth every 7 (seven) days.     Allergies:    Cephalexin and Dilaudid [hydromorphone]   Social History   Socioeconomic History  . Marital status: Married    Spouse name: Not on file  . Number of children: Not on file  . Years of education: Not on file  . Highest education level: Not on file  Occupational History  . Not on file  Tobacco Use  . Smoking status: Never Smoker  . Smokeless tobacco: Never Used  Vaping Use  . Vaping Use: Never used  Substance and Sexual Activity  . Alcohol use: Not Currently  . Drug use: No  . Sexual activity: Not on file  Other Topics Concern  . Not on file  Social History Narrative  . Not on file   Social Determinants of Health   Financial Resource Strain:   . Difficulty of Paying Living Expenses: Not on file  Food Insecurity:   . Worried About [place] in the Last Year: Not on file  . Ran Out of Food in the Last Year: Not on file  Transportation  Needs:   . Lack of Transportation (Medical): Not on file  . Lack of Transportation (Non-Medical): Not on file  Physical Activity:   . Days of Exercise per Week: Not on file  . Minutes of Exercise per Session: Not on file  Stress:   . Feeling of Stress : Not on file  Social Connections:   . Frequency of Communication with Friends and Family: Not on file  . Frequency of Social Gatherings with Friends and Family: Not on file  . Attends Religious Services: Not on file  . Active Member of Clubs or Organizations: Not on file  . Attends Archivist Meetings: Not on file  . Marital Status: Not on file     Family History: The patient's family history includes Cancer in his father; Congestive Heart Failure in his mother and sister; Diabetes in his brother and mother; Hypertension in his brother, mother, sister, and sister. ROS:   Please see the history of present illness.    All other systems reviewed and are negative.  EKGs/Labs/Other Studies Reviewed:    The following studies were reviewed today:   Recent  Labs: 11/01/2019: Magnesium 2.3 11/12/2019: ALT 16 12/01/2019: Platelets 469 03/05/2020: Hemoglobin 13.9 03/30/2020: BUN 20; Creatinine, Ser 1.15; Potassium 4.1; Sodium 139  Recent Lipid Panel    Component Value Date/Time   CHOL 178 03/08/2019 1038   TRIG 251 (H) 03/08/2019 1038   HDL 36 (L) 03/08/2019 1038   CHOLHDL 4.9 03/08/2019 1038   CHOLHDL 4.3 10/08/2016 1144   VLDL 45 (H) 10/08/2016 1144   LDLCALC 92 03/08/2019 1038    Physical Exam:    VS:  BP (!) 155/77   Pulse 80   Ht 6' (1.829 m)   Wt (!) 330 lb (149.7 kg) Comment: Per patient weight at home 10/01/2020  SpO2 95%   BMI 44.76 kg/m     Wt Readings from Last 3 Encounters:  10/02/20 (!) 330 lb (149.7 kg)  06/29/20 (!) 325 lb (147.4 kg)  03/30/20 (!) 326 lb 3.2 oz (148 kg)     GEN: Obese well developed in no acute distress HEENT: Normal NECK: No JVD; No carotid bruits LYMPHATICS: No lymphadenopathy CARDIAC: 1/6 midsystolic aortic ejection murmur does not radiate to the carotids does not encompass S2  no murmurs, rubs, gallops RESPIRATORY:  Clear to auscultation without rales, wheezing or rhonchi  ABDOMEN: Soft, non-tender, non-distended MUSCULOSKELETAL:  No edema; No deformity  SKIN: Warm and dry NEUROLOGIC:  Alert and oriented x 3 PSYCHIATRIC:  Normal affect    Signed, Shirlee More, MD  10/02/2020 11:41 AM    [place]

## 2020-10-02 ENCOUNTER — Other Ambulatory Visit: Payer: Self-pay

## 2020-10-02 ENCOUNTER — Encounter: Payer: Self-pay | Admitting: Cardiology

## 2020-10-02 ENCOUNTER — Ambulatory Visit (INDEPENDENT_AMBULATORY_CARE_PROVIDER_SITE_OTHER): Payer: Medicare Other | Admitting: Cardiology

## 2020-10-02 VITALS — BP 155/77 | HR 80 | Ht 72.0 in | Wt 330.0 lb

## 2020-10-02 DIAGNOSIS — Z7901 Long term (current) use of anticoagulants: Secondary | ICD-10-CM

## 2020-10-02 DIAGNOSIS — I110 Hypertensive heart disease with heart failure: Secondary | ICD-10-CM

## 2020-10-02 DIAGNOSIS — I350 Nonrheumatic aortic (valve) stenosis: Secondary | ICD-10-CM

## 2020-10-02 DIAGNOSIS — I482 Chronic atrial fibrillation, unspecified: Secondary | ICD-10-CM | POA: Diagnosis not present

## 2020-10-02 NOTE — Patient Instructions (Addendum)
  Medication Instructions:  Your physician recommends that you continue on your current medications as directed. Please refer to the Current Medication list given to you today.  *If you need a refill on your cardiac medications before your next appointment, please call your pharmacy*   Lab Work Your physician recommends that you return for lab work in: BMP, Pro BNP, CBC If you have labs (blood work) drawn today and your tests are completely normal, you will receive your results only by: Marland Kitchen MyChart Message (if you have MyChart) OR . A paper copy in the mail If you have any lab test that is abnormal or we need to change your treatment, we will call you to review the results.   Testing/Procedures: None   Follow-Up: At [facility], you and your health needs are our priority.  As part of our continuing mission to provide you with exceptional heart care, we have created designated Provider Care Teams.  These Care Teams include your primary Cardiologist (physician) and Advanced Practice Providers (APPs -  Physician Assistants and Nurse Practitioners) who all work together to provide you with the care you need, when you need it.  We recommend signing up for the patient portal called "MyChart".  Sign up information is provided on this After Visit Summary.  MyChart is used to connect with patients for Virtual Visits (Telemedicine).  Patients are able to view lab/test results, encounter notes, upcoming appointments, etc.  Non-urgent messages can be sent to your provider as well.   To learn more about what you can do with MyChart, go to [email or web address].    Your next appointment:   6 months  The format for your next appointment:   In Person  Provider:   Shirlee More, MD   Other Instructions None

## 2020-10-03 ENCOUNTER — Telehealth: Payer: Self-pay

## 2020-10-03 ENCOUNTER — Encounter: Payer: Self-pay | Admitting: Physician Assistant

## 2020-10-03 LAB — BASIC METABOLIC PANEL
BUN/Creatinine Ratio: 18 (ref 10–24)
BUN: 21 mg/dL (ref 8–27)
CO2: 29 mmol/L (ref 20–29)
Calcium: 9.7 mg/dL (ref 8.6–10.2)
Chloride: 96 mmol/L (ref 96–106)
Creatinine, Ser: 1.18 mg/dL (ref 0.76–1.27)
GFR calc Af Amer: 72 mL/min/{1.73_m2} (ref >59)
GFR calc non Af Amer: 63 mL/min/{1.73_m2} (ref >59)
Glucose: 109 mg/dL — ABNORMAL HIGH (ref 65–99)
Potassium: 4 mmol/L (ref 3.5–5.2)
Sodium: 139 mmol/L (ref 134–144)

## 2020-10-03 LAB — CBC
Hematocrit: 44.6 % (ref 37.5–51.0)
Hemoglobin: 15.2 g/dL (ref 13.0–17.7)
MCH: 29.6 pg (ref 26.6–33.0)
MCHC: 34.1 g/dL (ref 31.5–35.7)
MCV: 87 fL (ref 79–97)
Platelets: 267 10*3/uL (ref 150–450)
RBC: 5.13 x10E6/uL (ref 4.14–5.80)
RDW: 14.1 % (ref 11.6–15.4)
WBC: 7.5 10*3/uL (ref 3.4–10.8)

## 2020-10-03 LAB — PRO B NATRIURETIC PEPTIDE: NT-Pro BNP: 356 pg/mL (ref 0–376)

## 2020-10-03 NOTE — Telephone Encounter (Signed)
Per Dr. Munley lab results:  Good result no changes in treatment   Spoke with patient regarding results and recommendation.  Patient verbalizes understanding and is agreeable to plan of care. Advised patient to call back with any issues or concerns.   

## 2020-10-16 ENCOUNTER — Other Ambulatory Visit: Payer: Self-pay

## 2020-10-16 ENCOUNTER — Other Ambulatory Visit: Payer: Medicare Other

## 2020-10-16 DIAGNOSIS — E559 Vitamin D deficiency, unspecified: Secondary | ICD-10-CM | POA: Diagnosis not present

## 2020-10-16 DIAGNOSIS — Z79899 Other long term (current) drug therapy: Secondary | ICD-10-CM | POA: Diagnosis not present

## 2020-10-16 DIAGNOSIS — I10 Essential (primary) hypertension: Secondary | ICD-10-CM

## 2020-10-16 DIAGNOSIS — R7303 Prediabetes: Secondary | ICD-10-CM | POA: Diagnosis not present

## 2020-10-16 DIAGNOSIS — D508 Other iron deficiency anemias: Secondary | ICD-10-CM

## 2020-10-16 DIAGNOSIS — Z0000 Encounter for general adult medical examination without abnormal findings: Secondary | ICD-10-CM

## 2020-10-17 LAB — CBC WITH DIFFERENTIAL/PLATELET
Basophils Absolute: 0.1 10*3/uL (ref 0.0–0.2)
Basos: 1 %
EOS (ABSOLUTE): 0.3 10*3/uL (ref 0.0–0.4)
Eos: 4 %
Hematocrit: 43.6 % (ref 37.5–51.0)
Hemoglobin: 14.5 g/dL (ref 13.0–17.7)
Immature Grans (Abs): 0.1 10*3/uL (ref 0.0–0.1)
Immature Granulocytes: 1 %
Lymphocytes Absolute: 2.3 10*3/uL (ref 0.7–3.1)
Lymphs: 29 %
MCH: 29.1 pg (ref 26.6–33.0)
MCHC: 33.3 g/dL (ref 31.5–35.7)
MCV: 87 fL (ref 79–97)
Monocytes Absolute: 0.7 10*3/uL (ref 0.1–0.9)
Monocytes: 9 %
Neutrophils Absolute: 4.5 10*3/uL (ref 1.4–7.0)
Neutrophils: 56 %
Platelets: 294 10*3/uL (ref 150–450)
RBC: 4.99 x10E6/uL (ref 4.14–5.80)
RDW: 13.6 % (ref 11.6–15.4)
WBC: 7.9 10*3/uL (ref 3.4–10.8)

## 2020-10-17 LAB — COMPREHENSIVE METABOLIC PANEL
ALT: 31 IU/L (ref 0–44)
AST: 26 IU/L (ref 0–40)
Albumin/Globulin Ratio: 1.3 (ref 1.2–2.2)
Albumin: 4.2 g/dL (ref 3.8–4.8)
Alkaline Phosphatase: 92 IU/L (ref 44–121)
BUN/Creatinine Ratio: 17 (ref 10–24)
BUN: 19 mg/dL (ref 8–27)
Bilirubin Total: 0.4 mg/dL (ref 0.0–1.2)
CO2: 29 mmol/L (ref 20–29)
Calcium: 9.6 mg/dL (ref 8.6–10.2)
Chloride: 99 mmol/L (ref 96–106)
Creatinine, Ser: 1.11 mg/dL (ref 0.76–1.27)
GFR calc Af Amer: 78 mL/min/{1.73_m2} (ref >59)
GFR calc non Af Amer: 67 mL/min/{1.73_m2} (ref >59)
Globulin, Total: 3.2 g/dL (ref 1.5–4.5)
Glucose: 124 mg/dL — ABNORMAL HIGH (ref 65–99)
Potassium: 4.2 mmol/L (ref 3.5–5.2)
Sodium: 140 mmol/L (ref 134–144)
Total Protein: 7.4 g/dL (ref 6.0–8.5)

## 2020-10-17 LAB — LIPID PANEL
Chol/HDL Ratio: 4.2 ratio (ref 0.0–5.0)
Cholesterol, Total: 162 mg/dL (ref 100–199)
HDL: 39 mg/dL — ABNORMAL LOW (ref >39)
LDL Chol Calc (NIH): 89 mg/dL (ref 0–99)
Triglycerides: 197 mg/dL — ABNORMAL HIGH (ref 0–149)
VLDL Cholesterol Cal: 34 mg/dL (ref 5–40)

## 2020-10-17 LAB — TSH: TSH: 1.09 u[IU]/mL (ref 0.450–4.500)

## 2020-10-17 LAB — HEMOGLOBIN A1C
Est. average glucose Bld gHb Est-mCnc: 117 mg/dL
Hgb A1c MFr Bld: 5.7 % — ABNORMAL HIGH (ref 4.8–5.6)

## 2020-10-17 LAB — VITAMIN D 25 HYDROXY (VIT D DEFICIENCY, FRACTURES): Vit D, 25-Hydroxy: 49.4 ng/mL (ref 30.0–100.0)

## 2020-10-19 ENCOUNTER — Other Ambulatory Visit: Payer: Self-pay

## 2020-10-19 ENCOUNTER — Encounter: Payer: Self-pay | Admitting: Physician Assistant

## 2020-10-19 ENCOUNTER — Ambulatory Visit (INDEPENDENT_AMBULATORY_CARE_PROVIDER_SITE_OTHER): Payer: Medicare Other | Admitting: Physician Assistant

## 2020-10-19 VITALS — BP 144/60 | HR 77 | Ht 72.0 in | Wt 320.0 lb

## 2020-10-19 DIAGNOSIS — Z0000 Encounter for general adult medical examination without abnormal findings: Secondary | ICD-10-CM

## 2020-10-19 NOTE — Progress Notes (Signed)
Virtual Visit via Telephone Note:  I connected with Joel Dominguez by telephone and verified that I am speaking with the correct person using two identifiers.    I discussed the limitations, risks, security and privacy concerns for performing an evaluation and management service by telephone and the availability of in person appointments. The staff discussed with patient that there may be a patient responsible charge related to this service. The patient expressed understanding and agreed to proceed.   Location of Patient- Home Location of Provider- Office    Subjective:   Joel Dominguez is a 69 y.o. male who presents for Medicare Annual/Subsequent preventive examination.  Review of Systems:    General:   No F/C, wt loss Pulm:   No DIB, SOB, pleuritic chest pain Card:  No CP, palpitations Abd:  No n/v/d or pain Ext:  No inc edema from baseline    Objective:    Today's Vitals   10/19/20 1010  BP: (!) 144/60  Pulse: 77  Weight: (!) 320 lb (145.2 kg)  Height: 6' (1.829 m)   Body mass index is 43.4 kg/m.  Advanced Directives 11/11/2019 11/09/2019 04/15/2019 10/08/2016  Does Patient Have a Medical Advance Directive? Yes Yes No Yes  Type of Paramedic of [place];Living will [place];Living will - [place];Living will  Does patient want to make changes to medical advance directive? No - Patient declined No - Patient declined - -  Copy of [place] in Chart? No - copy requested No - copy requested - -    Current Medications (verified) Outpatient Encounter Medications as of 10/19/2020  Medication Sig  . B Complex-C (B-COMPLEX WITH VITAMIN C) tablet Take 1 tablet by mouth daily.  . bumetanide (BUMEX) 1 MG tablet TAKE 1 TABLET BY MOUTH DAILY. TAKE AN ADDITIONAL 1 TABLET ON MONDAY, WEDNESDAY, AND FRIDAY.  . carvedilol (COREG) 12.5 MG tablet Take 1 tablet (12.5 mg total) by mouth 2 (two) times daily with  a meal.  . ELIQUIS 5 MG TABS tablet TAKE 1 TABLET BY MOUTH 2 TIMES DAILY.  . fluticasone (FLONASE) 50 MCG/ACT nasal spray Place 1 spray into both nostrils daily.  . hydrocerin (EUCERIN) CREA Apply 1 application topically 2 (two) times daily.  Marland Kitchen HYDROcodone-acetaminophen (NORCO) 10-325 MG tablet Take 1 tablet by mouth every 8 (eight) hours as needed.  . Multiple Vitamin (MULTIVITAMIN) tablet Take 1 tablet by mouth daily.  Marland Kitchen spironolactone (ALDACTONE) 25 MG tablet Take 1 tablet (25 mg total) by mouth daily.  . Vitamin D, Cholecalciferol, 50 MCG (2000 UT) CAPS Take 50 mcg by mouth daily.  . Vitamin D, Ergocalciferol, (DRISDOL) 1.25 MG (50000 UNIT) CAPS capsule Take 1 capsule (50,000 Units total) by mouth every 7 (seven) days.  Skipper Cliche Saline Nasal No-Drip GEL Place 1 spray into the nose at bedtime. (Patient not taking: No sig reported)   No facility-administered encounter medications on file as of 10/19/2020.    Allergies (verified) Cephalexin and Dilaudid [hydromorphone]   History: Past Medical History:  Diagnosis Date  . A-fib 10/08/2016  . Aortic stenosis 11/06/2017  . Atherosclerotic heart disease of native coronary artery with unspecified angina pectoris ([place]) 10/08/2016  . Cellulitis 11/2019   right lower extremity  . CHF (congestive heart failure) ([place])   . Chronic anticoagulation 10/08/2016  . Chronic combined systolic and diastolic heart failure ([place]) 10/08/2016   EF range 35-45% in 2016  . Chronic congestive heart failure ([place])   .  Chronic ulcer of toe ([place]) 08/04/2019  . Coagulation disorder ([place]) 04/30/2019  . Foot drop- R 10/08/2016   Patient known foot drop for many years due to lumbar spinal stenosis.-Over the past couple of months symptoms have gotten worse.  He feels he is tripping more and cannot lift his foot up as much. Toes are curling and calluses on end of toes.    - Did see spinal surgeon many yrs ago.  Declined sx at that time.     . Generalized OA 10/08/2016   Ortho  doc-  Dr. [facility] in [place]- both knees replaced 08, 09.     . H/O noncompliance with medical treatment, presenting hazards to health 10/22/2017  . Heart disease   . Hypertension   . Hypertensive heart disease with heart failure ([place]) 11/10/2017  . Longstanding persistent atrial fibrillation ([place])   . Obesity, Class III, BMI 40-49.9 (morbid obesity) ([place]) 10/08/2016  . OSA (obstructive sleep apnea) 10/08/2016   Bi-PAP nitely- been 20+ yrs now  . Osteoarthritis 11/19/2017  . Pulmonary hypertension ([place]) 10/08/2016  . 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 fascicular block) 11/07/2017  . Sleep apnea with use of continuous positive airway pressure (CPAP) 10/08/2016   Bi-PAP nitely- been 20+ yrs now  . Spinal stenosis, lumbar region, with neurogenic claudication 10/08/2016   Back specialist in [place]-    only surgeon he would use- pt declined referral here for back pain mgt    . Stroke [facility])    Past Surgical History:  Procedure Laterality Date  . I & D EXTREMITY Right 10/30/2019   Procedure: IRRIGATION AND DEBRIDEMENT EXTREMITY, right leg possible wound vac placement;  Surgeon: Marchia Bond, MD;  Location: [facility];  Service: Orthopedics;  Laterality: Right;  . REPLACEMENT TOTAL KNEE Bilateral   . TIBIA FRACTURE SURGERY    . VITRECTOMY PARS PLANA 23 GAUGE FOR ENDOPHTHALMITIS Left 03/04/2020   Procedure: VITRECTOMY PARS PLANA 23 GAUGE FOR ENDOPHTHALMITIS;  Surgeon: Jalene Mullet, MD;  Location: [facility];  Service: Ophthalmology;  Laterality: Left;   Family History  Problem Relation Age of Onset  . Congestive Heart Failure Mother   . Hypertension Mother   . Diabetes Mother   . Cancer Father        lung  . Congestive Heart Failure Sister   . Hypertension Sister   . Hypertension Brother   . Hypertension Sister   . Diabetes Brother    Social History   Socioeconomic History  . Marital status: Married    Spouse name: Not on file  . Number of children: Not on file  . Years of  education: Not on file  . Highest education level: Not on file  Occupational History  . Not on file  Tobacco Use  . Smoking status: Never Smoker  . Smokeless tobacco: Never Used  Vaping Use  . Vaping Use: Never used  Substance and Sexual Activity  . Alcohol use: Not Currently  . Drug use: No  . Sexual activity: Not on file  Other Topics Concern  . Not on file  Social History Narrative  . Not on file   Social Determinants of Health   Financial Resource Strain: Not on file  Food Insecurity: Not on file  Transportation Needs: Not on file  Physical Activity: Not on file  Stress: Not on file  Social Connections: Not on file    Tobacco Counseling Counseling given: Not Answered    Diabetic?No  Activities of Daily Living In your present state of health, do you have any difficulty performing the following activities: 10/19/2020 11/11/2019  Hearing? Y N  Vision? Y Y  Difficulty concentrating or making decisions? N N  Walking or climbing stairs? Y Y  Dressing or bathing? N N  Doing errands, shopping? N -  Some recent data might be hidden    Patient Care Team: Lorrene Reid, PA-C as PCP - General Bettina Gavia Hilton Cork, MD as PCP - Cardiology (Cardiology) Lucia Bitter., MD as Physician Assistant (Pain Medicine) Richardo Priest, MD as Consulting Physician (Cardiology) Edrick Kins, DPM as Consulting Physician (Podiatry)  Indicate any recent Medical Services you may have received from other than [facility] providers in the past year (date may be approximate).     Assessment:   This is a routine wellness examination for [place].  Hearing/Vision screen No exam data present  Dietary issues and exercise activities discussed: -Recommend to monitor/reduce simple carbohydrates and continue a heart healthy diet. Stay well hydrated.   Goals   None    Depression Screen PHQ 2/9 Scores 10/19/2020 06/29/2020 02/28/2020 12/01/2019 10/21/2019 08/05/2019 03/09/2019  PHQ - 2  Score 0 0 0 0 0 0 0  PHQ- 9 Score 2 2 0 2 1 1 3     Fall Risk Fall Risk  10/19/2020 06/29/2020 03/09/2019 02/23/2019 04/30/2018  Falls in the past year? 0 1 0 0 No  Number falls in past yr: - 0 - - -  Injury with Fall? - 0 - - -  Risk for fall due to : - - Impaired mobility;Impaired balance/gait - -  Follow up Falls evaluation completed Falls evaluation completed - Falls evaluation completed -    FALL RISK PREVENTION PERTAINING TO THE HOME:  Any stairs in or around the home? Yes  If so, are there any without handrails? Yes  Home free of loose throw rugs in walkways, pet beds, electrical cords, etc? No  Adequate lighting in your home to reduce risk of falls? Yes   ASSISTIVE DEVICES UTILIZED TO PREVENT FALLS:  Life alert? No  Use of a cane, walker or w/c? Yes  Grab bars in the bathroom? Yes  Shower chair or bench in shower? Yes  Elevated toilet seat or a handicapped toilet? Yes   TIMED UP AND GO:  Was the test performed? No .  Length of time to ambulate 10 feet: 0 sec.   TELEHEALTH  Cognitive Function: wnl   6CIT Screen 10/19/2020 03/09/2019  What Year? 0 points 0 points  What month? 0 points 0 points  What time? 0 points 0 points  Count back from 20 0 points 0 points  Months in reverse 0 points 0 points  Repeat phrase 0 points 4 points  Total Score 0 4    Immunizations Immunization History  Administered Date(s) Administered  . Fluad Quad(high Dose 65+) 08/05/2019, 09/07/2020  . Influenza, High Dose Seasonal PF 10/08/2016, 09/18/2018  . Influenza-Unspecified 09/18/2018  . Moderna Sars-Covid-2 Vaccination 12/18/2019, 01/15/2020  . Pneumococcal Conjugate-13 05/13/2014  . Pneumococcal Polysaccharide-23 03/11/2007, 08/04/2010    TDAP status: Due, Education has been provided regarding the importance of this vaccine. Advised may receive this vaccine at local pharmacy or Health Dept. Aware to provide a copy of the vaccination record if obtained from local pharmacy or Health  Dept. Verbalized acceptance and understanding.  Flu Vaccine status: Up to date  Pneumococcal vaccine status: Up to date  Covid-19 vaccine status: Completed vaccines  Qualifies for  Shingles Vaccine? Yes   Zostavax completed No   Shingrix Completed?: No.    Education has been provided regarding the importance of this vaccine. Patient has been advised to call insurance company to determine out of pocket expense if they have not yet received this vaccine. Advised may also receive vaccine at local pharmacy or Health Dept. Verbalized acceptance and understanding.  Screening Tests Health Maintenance  Topic Date Due  . PNA vac Low Risk Adult (2 of 2 - PPSV23) 05/06/2016  . COVID-19 Vaccine (3 - Booster for Moderna series) 07/17/2020  . TETANUS/TDAP  10/19/2021 (Originally 05/06/1970)  . Hepatitis C Screening  10/19/2021 (Originally 1951/07/26)  . COLONOSCOPY  10/23/2027  . INFLUENZA VACCINE  Completed    Health Maintenance  Health Maintenance Due  Topic Date Due  . PNA vac Low Risk Adult (2 of 2 - PPSV23) 05/06/2016  . COVID-19 Vaccine (3 - Booster for Moderna series) 07/17/2020    Colorectal cancer screening: Type of screening: Colonoscopy. Completed 08/20/2013. Repeat every 10 years  Lung Cancer Screening: (Low Dose CT Chest recommended if Age 11-80 years, 30 pack-year currently smoking OR have quit w/in 15years.) does not qualify.   Lung Cancer Screening Referral:   Additional Screening:  Hepatitis C Screening: does qualify; Completed Patient declined   Vision Screening: Recommended annual ophthalmology exams for early detection of glaucoma and other disorders of the eye. Is the patient up to date with their annual eye exam?  Yes  Who is the provider or what is the name of the office in which the patient attends annual eye exams? Dr. Posey Pronto If pt is not established with a provider, would they like to be referred to a provider to establish care? No .   Dental Screening: Recommended  annual dental exams for proper oral hygiene  Community Resource Referral / Chronic Care Management: CRR required this visit?  No   CCM required this visit?  No      Plan:  -Continue current medication regimen. -Continue to follow up with various specialists.  -Discussed most recent labs which are essentially within normal limits or stable from prior. -Follow up in 4 months for HTN, PreDM  I have personally reviewed and noted the following in the patient's chart:   . Medical and social history . Use of alcohol, tobacco or illicit drugs  . Current medications and supplements . Functional ability and status . Nutritional status . Physical activity . Advanced directives . List of other physicians . Hospitalizations, surgeries, and ER visits in previous 12 months . Vitals . Screenings to include cognitive, depression, and falls . Referrals and appointments  In addition, I have reviewed and discussed with patient certain preventive protocols, quality metrics, and best practice recommendations. A written personalized care plan for preventive services as well as general preventive health recommendations were provided to patient.

## 2020-10-20 ENCOUNTER — Ambulatory Visit: Payer: Medicare Other | Admitting: Podiatry

## 2020-10-21 IMAGING — CT CT EXTREM LOW W/O CM*R*
3 of 8 series · 13 of 33 positions shown, 15 images · non-contrast
Comparison: None.

CLINICAL DATA: Severe right leg cellulitis

EXAM:
CT OF THE LOWER RIGHT EXTREMITY WITHOUT CONTRAST
TECHNIQUE: Multidetector CT imaging of the right lower extremity was performed
according to the standard protocol.

[Series 3: lower ext (id) bone · axial · 0.64mm/px · z∈[+136,+862]mm · 7 of 646 slices shown, 9 images]
[im 81/646  soft-tissue]
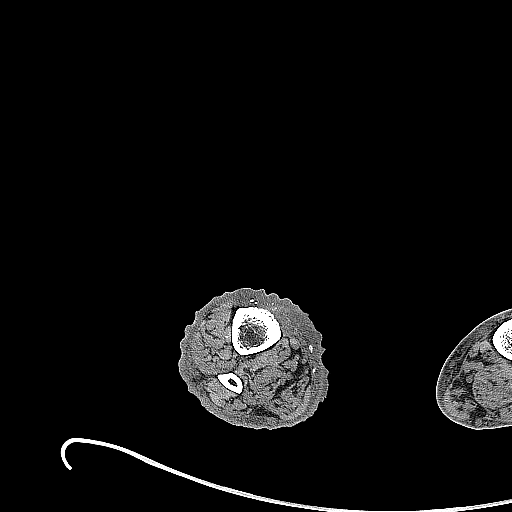
[im 81/646  bone]
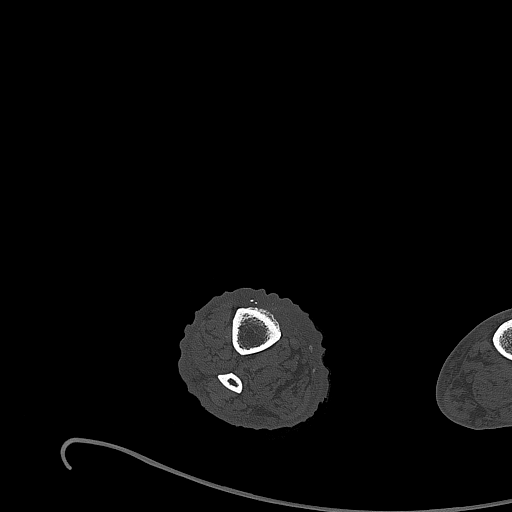
[im 162/646  bone]
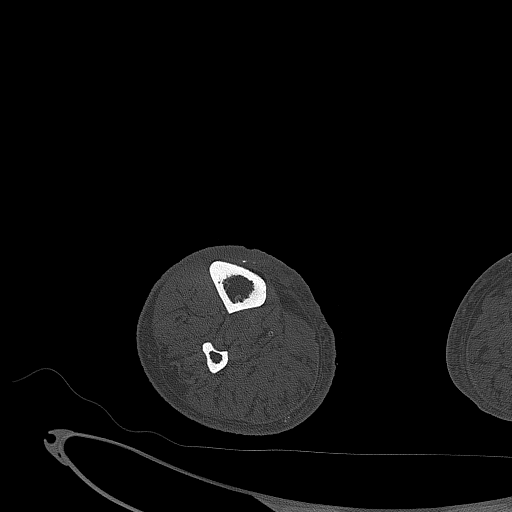
[im 242/646  bone]
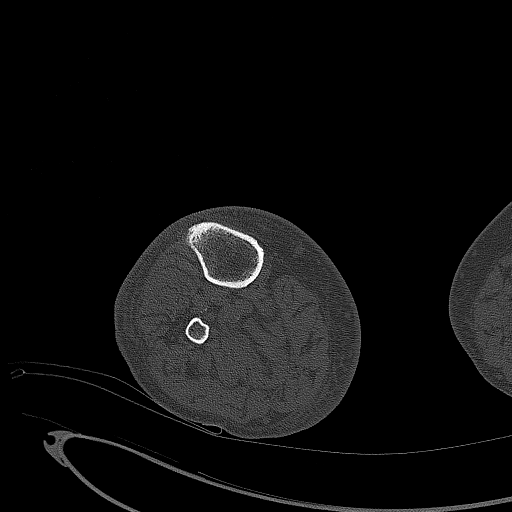
[im 323/646  bone]
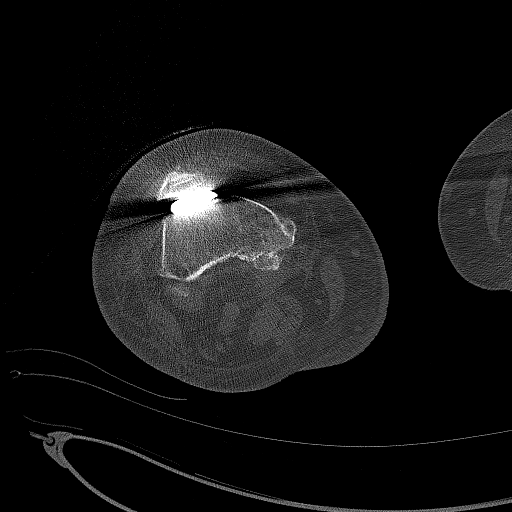
[im 404/646  soft-tissue]
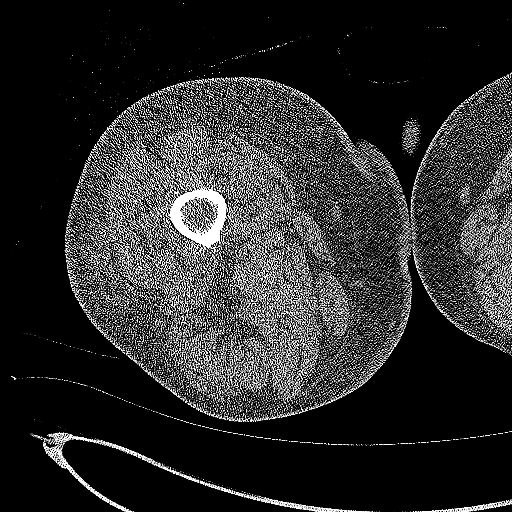
[im 404/646  bone]
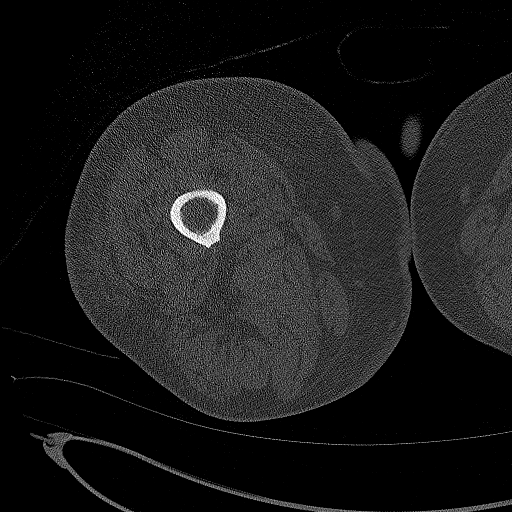
[im 484/646  bone]
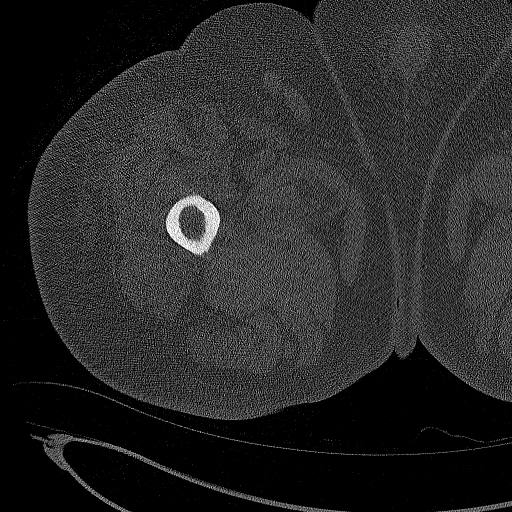
[im 565/646  bone]
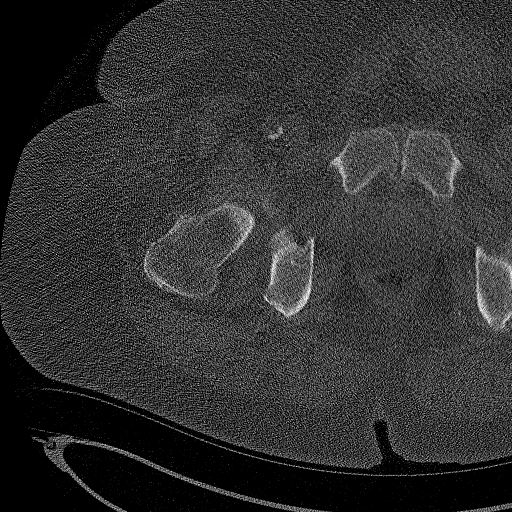

[Series 4: lower ext 1.5 st · axial · 0.64mm/px · z∈[+542,+836]mm · 3 of 392 slices shown]
[im 98/392  bone]
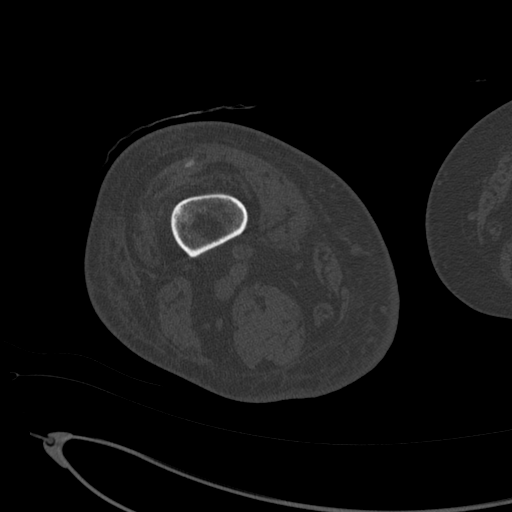
[im 196/392  bone]
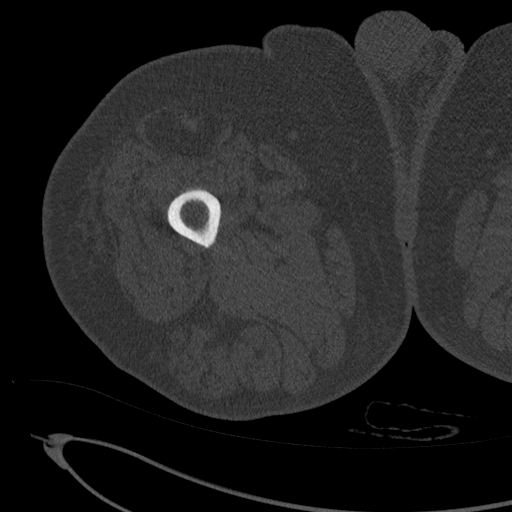
[im 294/392  bone]
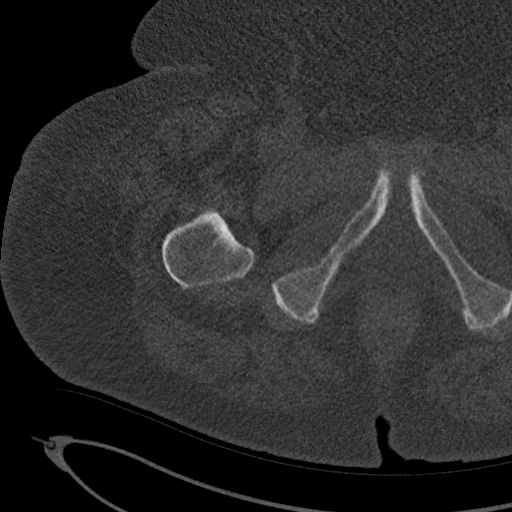

[Series 10: lower ext sag st · sagittal · 0.64mm/px · 3 of 202 slices shown]
[im 51/202  bone]
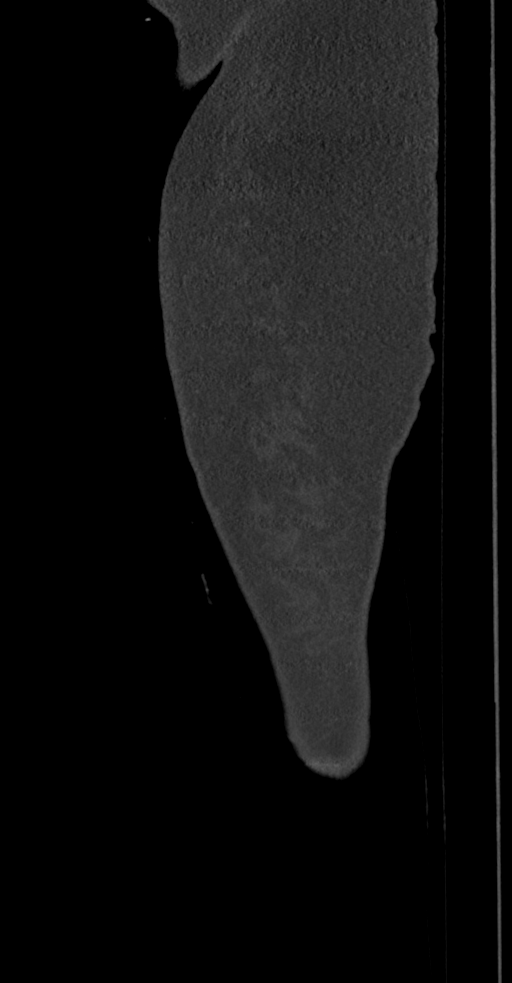
[im 101/202  bone]
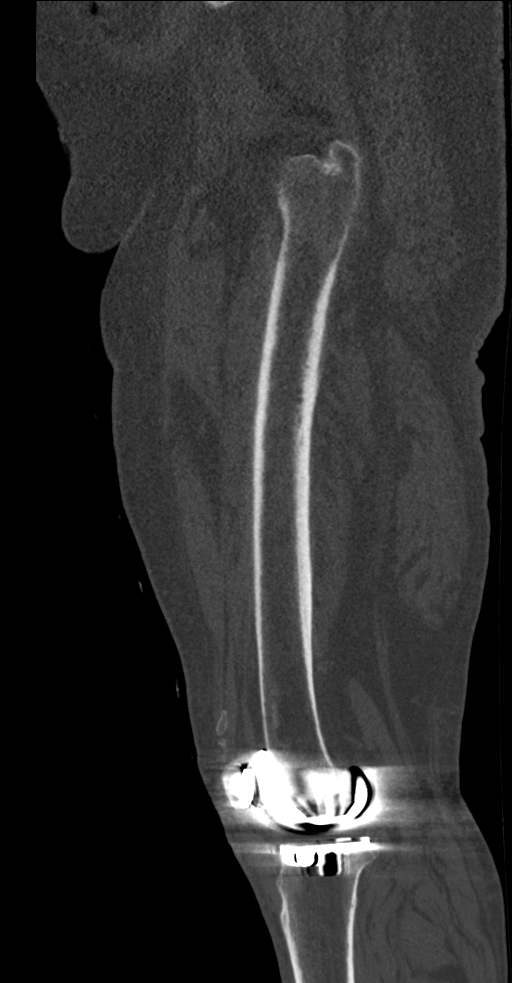
[im 151/202  bone]
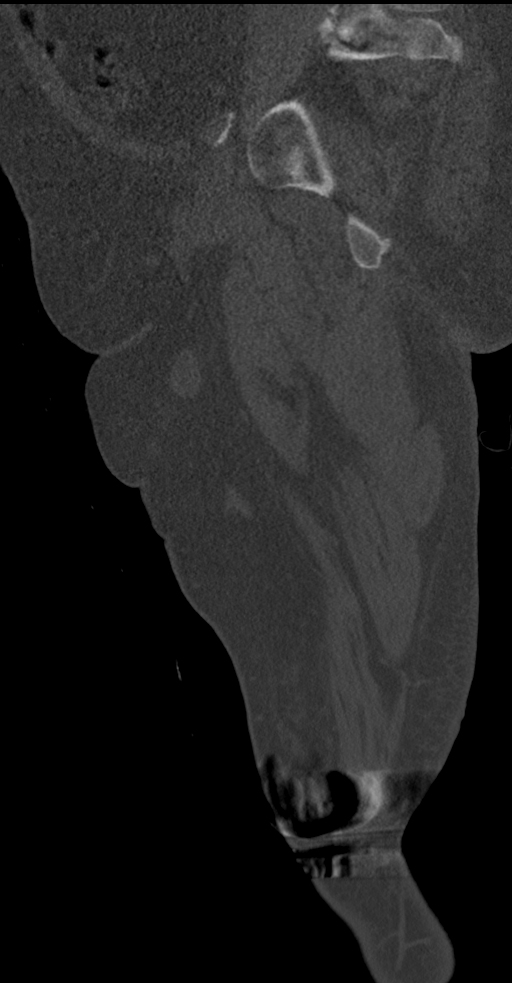

[13 of 33 positions shown; findings below may reference images not displayed]

FINDINGS: Bones/Joint/Cartilage

No fracture or dislocation. There is diffuse osteopenia. Moderate
right hip osteoarthritis is seen with superior joint space loss and
marginal osteophyte formation. The patient is status post right
total knee arthroplasty. No periprosthetic lucency or fracture is
identified. The sacroiliac joints appear to be intact. There is a
small fragmented osteophyte seen at the superior right sacroiliac
joint.

Ligaments

Suboptimally assessed by CT.

Muscles and Tendons

There is focal fatty atrophy seen within the mid rectus muscle with
a tiny focus calcification, likely from prior focal tear. There is
mild fatty atrophy noted throughout the remainder of the muscles.
The visualized portions the tendons appear to be intact, however
suboptimally visualized.

Soft tissues

There is diffuse subcutaneous edema and skin thickening seen
surrounding the posterior medial lower leg there is a superficial
cystic collection. Best seen on series 204, image 312 measuring 3 cm
in length.
IMPRESSION: 1. No acute osseous abnormality. No definite evidence of
osteomyelitis.
2. Diffuse findings suggestive of cellulitis with superficial
loculated collection over the posterior medial lower extremity which
could represent phlegmon or early superficial abscess.
3. Focal fatty atrophy of the mid rectus femoris musculature, likely
from a prior tear.

## 2020-10-23 ENCOUNTER — Other Ambulatory Visit: Payer: Self-pay | Admitting: Cardiology

## 2020-10-23 NOTE — Telephone Encounter (Signed)
Rx refill sent to pharmacy. 

## 2020-11-07 ENCOUNTER — Ambulatory Visit: Payer: Medicare Other | Admitting: Podiatry

## 2020-11-15 DIAGNOSIS — H43811 Vitreous degeneration, right eye: Secondary | ICD-10-CM | POA: Diagnosis not present

## 2020-11-15 DIAGNOSIS — H11432 Conjunctival hyperemia, left eye: Secondary | ICD-10-CM | POA: Diagnosis not present

## 2020-11-15 DIAGNOSIS — H21542 Posterior synechiae (iris), left eye: Secondary | ICD-10-CM | POA: Diagnosis not present

## 2020-11-15 DIAGNOSIS — H43391 Other vitreous opacities, right eye: Secondary | ICD-10-CM | POA: Diagnosis not present

## 2020-11-15 DIAGNOSIS — H35031 Hypertensive retinopathy, right eye: Secondary | ICD-10-CM | POA: Diagnosis not present

## 2020-11-15 DIAGNOSIS — H25813 Combined forms of age-related cataract, bilateral: Secondary | ICD-10-CM | POA: Diagnosis not present

## 2020-11-29 ENCOUNTER — Other Ambulatory Visit: Payer: Self-pay | Admitting: Physician Assistant

## 2020-11-29 DIAGNOSIS — H43391 Other vitreous opacities, right eye: Secondary | ICD-10-CM | POA: Diagnosis not present

## 2020-11-29 DIAGNOSIS — H43811 Vitreous degeneration, right eye: Secondary | ICD-10-CM | POA: Diagnosis not present

## 2020-11-29 DIAGNOSIS — H25811 Combined forms of age-related cataract, right eye: Secondary | ICD-10-CM | POA: Diagnosis not present

## 2020-11-29 DIAGNOSIS — H11432 Conjunctival hyperemia, left eye: Secondary | ICD-10-CM | POA: Diagnosis not present

## 2020-12-04 DIAGNOSIS — I110 Hypertensive heart disease with heart failure: Secondary | ICD-10-CM

## 2020-12-05 MED ORDER — CARVEDILOL 12.5 MG PO TABS: 12.5000 mg | ORAL_TABLET | Freq: Two times a day (BID) | ORAL | 3 refills | Status: DC

## 2020-12-05 MED ORDER — APIXABAN 5 MG PO TABS: 5.0000 mg | ORAL_TABLET | Freq: Two times a day (BID) | ORAL | 3 refills | Status: DC

## 2020-12-05 MED ORDER — SPIRONOLACTONE 25 MG PO TABS: 25.0000 mg | ORAL_TABLET | Freq: Every day | ORAL | 3 refills | Status: DC

## 2020-12-22 ENCOUNTER — Other Ambulatory Visit: Payer: Self-pay | Admitting: Physician Assistant

## 2020-12-22 DIAGNOSIS — I110 Hypertensive heart disease with heart failure: Secondary | ICD-10-CM

## 2020-12-25 ENCOUNTER — Other Ambulatory Visit: Payer: Self-pay | Admitting: Physician Assistant

## 2020-12-25 DIAGNOSIS — I110 Hypertensive heart disease with heart failure: Secondary | ICD-10-CM

## 2021-01-09 ENCOUNTER — Encounter: Payer: Self-pay | Admitting: Physician Assistant

## 2021-01-09 ENCOUNTER — Telehealth: Payer: Self-pay | Admitting: Physician Assistant

## 2021-01-09 ENCOUNTER — Ambulatory Visit (INDEPENDENT_AMBULATORY_CARE_PROVIDER_SITE_OTHER): Payer: Medicare Other | Admitting: Physician Assistant

## 2021-01-09 ENCOUNTER — Other Ambulatory Visit: Payer: Self-pay

## 2021-01-09 VITALS — BP 150/74 | HR 86 | Temp 96.3°F | Ht 72.0 in | Wt 330.0 lb

## 2021-01-09 DIAGNOSIS — R062 Wheezing: Secondary | ICD-10-CM | POA: Diagnosis not present

## 2021-01-09 DIAGNOSIS — J0140 Acute pansinusitis, unspecified: Secondary | ICD-10-CM

## 2021-01-09 MED ORDER — PREDNISONE 10 MG PO TABS
10.0000 mg | ORAL_TABLET | Freq: Every day | ORAL | 0 refills | Status: DC
Start: 2021-01-09 — End: 2021-01-17

## 2021-01-09 MED ORDER — METHYLPREDNISOLONE SODIUM SUCC 125 MG IJ SOLR
40.0000 mg | Freq: Once | INTRAMUSCULAR | Status: AC
  Administered 2021-01-09: 40 mg via INTRAMUSCULAR

## 2021-01-09 MED ORDER — DOXYCYCLINE HYCLATE 100 MG PO TABS: 100.0000 mg | ORAL_TABLET | Freq: Two times a day (BID) | ORAL | 0 refills | Status: DC

## 2021-01-09 NOTE — Progress Notes (Signed)
Acute Office Visit  Subjective:    Patient ID: Joel Dominguez, male    DOB: 05/12/1951, 70 y.o.   MRN: [id number]  Chief Complaint  Patient presents with  . Nasal Congestion  . Cough    HPI Patient is in today with c/o sinus pressure, congestion, postnasal drainage, fatigue, cough, wheezing and shortness of breath for 1 week. Denies fever, chills, chest pain, loss of taste or smell, nausea or vomiting. In the mornings has a runny nose. Did an at home Covid test this morning which resulted negative. Has been using nebulizer which helps with congestion and breathing. Taking Mucinex and using Flonase which has provided mild-moderate relief.    Past Medical History:  Diagnosis Date  . A-fib 10/08/2016  . Aortic stenosis 11/06/2017  . Atherosclerotic heart disease of native coronary artery with unspecified angina pectoris ([place]) 10/08/2016  . Cellulitis 11/2019   right lower extremity  . CHF (congestive heart failure) ([place])   . Chronic anticoagulation 10/08/2016  . Chronic combined systolic and diastolic heart failure ([place]) 10/08/2016   EF range 35-45% in 2016  . Chronic congestive heart failure ([place])   . Chronic ulcer of toe ([place]) 08/04/2019  . Coagulation disorder ([place]) 04/30/2019  . Foot drop- R 10/08/2016   Patient known foot drop for many years due to lumbar spinal stenosis.-Over the past couple of months symptoms have gotten worse.  He feels he is tripping more and cannot lift his foot up as much. Toes are curling and calluses on end of toes.    - Did see spinal surgeon many yrs ago.  Declined sx at that time.     . Generalized OA 10/08/2016   Ortho doc-  [facility]. [facility] in [place]- both knees replaced 08, 09.     . H/O noncompliance with medical treatment, presenting hazards to health 10/22/2017  . Heart disease   . Hypertension   . Hypertensive heart disease with heart failure ([place]) 11/10/2017  . Longstanding persistent atrial fibrillation ([place])   . Obesity, Class III, BMI 40-49.9 (morbid  obesity) ([place]) 10/08/2016  . OSA (obstructive sleep apnea) 10/08/2016   Bi-PAP nitely- been 20+ yrs now  . Osteoarthritis 11/19/2017  . Pulmonary hypertension ([place]) 10/08/2016  . 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 fascicular block) 11/07/2017  . Sleep apnea with use of continuous positive airway pressure (CPAP) 10/08/2016   Bi-PAP nitely- been 20+ yrs now  . Spinal stenosis, lumbar region, with neurogenic claudication 10/08/2016   Back specialist in [place]-    only surgeon he would use- pt declined referral here for back pain mgt    . Stroke [facility])     Past Surgical History:  Procedure Laterality Date  . I & D EXTREMITY Right 10/30/2019   Procedure: IRRIGATION AND DEBRIDEMENT EXTREMITY, right leg possible wound vac placement;  Surgeon: Marchia Bond, MD;  Location: [facility];  Service: Orthopedics;  Laterality: Right;  . REPLACEMENT TOTAL KNEE Bilateral   . TIBIA FRACTURE SURGERY    . VITRECTOMY PARS PLANA 23 GAUGE FOR ENDOPHTHALMITIS Left 03/04/2020   Procedure: VITRECTOMY PARS PLANA 23 GAUGE FOR ENDOPHTHALMITIS;  Surgeon: Jalene Mullet, MD;  Location: [facility];  Service: Ophthalmology;  Laterality: Left;    Family History  Problem Relation Age of Onset  . Congestive Heart Failure Mother   . Hypertension Mother   . Diabetes Mother   . Cancer Father        lung  . Congestive Heart Failure Sister   .  Hypertension Sister   . Hypertension Brother   . Hypertension Sister   . Diabetes Brother     Social History   Socioeconomic History  . Marital status: Married    Spouse name: Not on file  . Number of children: Not on file  . Years of education: Not on file  . Highest education level: Not on file  Occupational History  . Not on file  Tobacco Use  . Smoking status: Never Smoker  . Smokeless tobacco: Never Used  Vaping Use  . Vaping Use: Never used  Substance and Sexual Activity  . Alcohol use: Not Currently  . Drug use: No  . Sexual activity: Not on file  Other Topics  Concern  . Not on file  Social History Narrative  . Not on file   Social Determinants of Health   Financial Resource Strain: Not on file  Food Insecurity: Not on file  Transportation Needs: Not on file  Physical Activity: Not on file  Stress: Not on file  Social Connections: Not on file  Intimate Partner Violence: Not on file    Outpatient Medications Prior to Visit  Medication Sig Dispense Refill  . apixaban (ELIQUIS) 5 MG TABS tablet Take 1 tablet (5 mg total) by mouth 2 (two) times daily. 180 tablet 3  . [place] Saline Nasal No-Drip GEL Place 1 spray into the nose at bedtime. 22 mL 0  . B Complex-C (B-COMPLEX WITH VITAMIN C) tablet Take 1 tablet by mouth daily. 30 tablet 0  . bumetanide (BUMEX) 1 MG tablet TAKE 1 TABLET BY MOUTH DAILY. TAKE AN ADDITIONAL 1 TABLET ON MONDAY, WEDNESDAY, AND FRIDAY. 90 tablet 1  . carvedilol (COREG) 12.5 MG tablet Take 1 tablet (12.5 mg total) by mouth 2 (two) times daily with a meal. 180 tablet 3  . fluticasone (FLONASE) 50 MCG/ACT nasal spray Place 1 spray into both nostrils daily. 16 g 0  . hydrocerin (EUCERIN) CREA Apply 1 application topically 2 (two) times daily. 454 g 0  . HYDROcodone-acetaminophen (NORCO) 10-325 MG tablet Take 1 tablet by mouth every 8 (eight) hours as needed.    . Multiple Vitamin (MULTIVITAMIN) tablet Take 1 tablet by mouth daily.    Marland Kitchen spironolactone (ALDACTONE) 25 MG tablet Take 1 tablet (25 mg total) by mouth daily. 90 tablet 3  . Vitamin D, Cholecalciferol, 50 MCG (2000 UT) CAPS Take 50 mcg by mouth daily.    . Vitamin D, Ergocalciferol, (DRISDOL) 1.25 MG (50000 UNIT) CAPS capsule Take 1 capsule (50,000 Units total) by mouth every 7 (seven) days. 12 capsule 1   No facility-administered medications prior to visit.    Allergies  Allergen Reactions  . Cephalexin Rash  . Dilaudid [Hydromorphone] Other (See Comments)    Hallucinations    Review of Systems A fourteen system review of systems was performed and found to be  positive as per HPI.    Objective:    Physical Exam General:  Well Developed, well nourished, in no acute distress  Neuro:  Alert and oriented,  extra-ocular muscles intact  HEENT:  Normocephalic, atraumatic, right ear cerumen impaction, normal TM of left ear, TTP of frontal and maxillary sinus, boggy turbinates, mildly erythematous posterior oropharynx without exudates.  Skin:  no gross rash, warm, pink. Cardiac:  RRR, S1 S2 wnl's Respiratory:  Inspiratory and expiratory wheezing with scattered rhonchi, no crackles/rales, Not using accessory muscles, speaking in full sentences. Vascular:  Ext warm, no cyanosis apprec.; cap RF less 2 sec. Psych:  No HI/SI, judgement and insight good, Euthymic mood. Full Affect.   BP (!) 158/77   Pulse 86   Temp (!) 96.3 F (35.7 C)   Ht 6' (1.829 m)   Wt (!) 330 lb (149.7 kg)   SpO2 93%   BMI 44.76 kg/m  Wt Readings from Last 3 Encounters:  01/09/21 (!) 330 lb (149.7 kg)  10/19/20 (!) 320 lb (145.2 kg)  10/02/20 (!) 330 lb (149.7 kg)    Health Maintenance Due  Topic Date Due  . PNA vac Low Risk Adult (2 of 2 - PPSV23) 05/06/2016  . COVID-19 Vaccine (3 - Booster for Moderna series) 07/17/2020    There are no preventive care reminders to display for this patient.   Lab Results  Component Value Date   TSH 1.090 10/16/2020   Lab Results  Component Value Date   WBC 7.9 10/16/2020   HGB 14.5 10/16/2020   HCT 43.6 10/16/2020   MCV 87 10/16/2020   PLT 294 10/16/2020   Lab Results  Component Value Date   NA 140 10/16/2020   K 4.2 10/16/2020   CO2 29 10/16/2020   GLUCOSE 124 (H) 10/16/2020   BUN 19 10/16/2020   CREATININE 1.11 10/16/2020   BILITOT 0.4 10/16/2020   ALKPHOS 92 10/16/2020   AST 26 10/16/2020   ALT 31 10/16/2020   PROT 7.4 10/16/2020   ALBUMIN 4.2 10/16/2020   CALCIUM 9.6 10/16/2020   ANIONGAP 7 11/15/2019   Lab Results  Component Value Date   CHOL 162 10/16/2020   Lab Results  Component Value Date   HDL 39  (L) 10/16/2020   Lab Results  Component Value Date   LDLCALC 89 10/16/2020   Lab Results  Component Value Date   TRIG 197 (H) 10/16/2020   Lab Results  Component Value Date   CHOLHDL 4.2 10/16/2020   Lab Results  Component Value Date   HGBA1C 5.7 (H) 10/16/2020       Assessment & Plan:   Problem List Items Addressed This Visit   None   Visit Diagnoses    Acute non-recurrent pansinusitis    -  Primary   Relevant Medications   doxycycline (VIBRA-TABS) 100 MG tablet   predniSONE (DELTASONE) 10 MG tablet     Acute non-recurrent pansinusitis: -Given severity of symptoms with minimal improvement for >7 days, SpO2 93%, and significant wheezing/rhonchi on exam will start antibiotic therapy and administer Solu-medrol 40 mg in office and start a low dose prednisone x 5 days (adivsed to start tomorrow). -Continue home supportive care, monitor symptoms and advised can use nebulizer every 6 hours. -Advised to let me know if symptoms fail to improve or worsen and will do further evaluation with CXR.   Meds ordered this encounter  Medications  . doxycycline (VIBRA-TABS) 100 MG tablet    Sig: Take 1 tablet (100 mg total) by mouth 2 (two) times daily.    Dispense:  20 tablet    Refill:  0    Order Specific Question:   Supervising Provider    Answer:   Beatrice Lecher D [[id number]]  . predniSONE (DELTASONE) 10 MG tablet    Sig: Take 1 tablet (10 mg total) by mouth daily with breakfast.    Dispense:  5 tablet    Refill:  0    Order Specific Question:   Supervising Provider    Answer:   Beatrice Lecher D [[id number]]   Note:  This note was prepared with assistance of Dragon voice  recognition software. Occasional wrong-word or sound-a-like substitutions may have occurred due to the inherent limitations of voice recognition software.   Lorrene Reid, PA-C

## 2021-01-09 NOTE — Telephone Encounter (Signed)
Patient states he believes his CPAP machine is going him severe congestion as the machine itself has been recalled. He cannot seem to get the congestion broke up and is feeling some pressure from it, please advise. Thanks

## 2021-01-09 NOTE — Telephone Encounter (Signed)
Pt states he thinks he has sinusitis. Pt has taken home covid test that was negative. Pt scheduled today at 115pm. AS< CMA

## 2021-01-09 NOTE — Patient Instructions (Signed)

## 2021-01-11 ENCOUNTER — Telehealth: Payer: Self-pay | Admitting: Physician Assistant

## 2021-01-11 ENCOUNTER — Other Ambulatory Visit: Payer: Self-pay | Admitting: Physician Assistant

## 2021-01-11 DIAGNOSIS — R0602 Shortness of breath: Secondary | ICD-10-CM

## 2021-01-11 DIAGNOSIS — R062 Wheezing: Secondary | ICD-10-CM

## 2021-01-11 MED ORDER — ALBUTEROL SULFATE (2.5 MG/3ML) 0.083% IN NEBU: 2.5000 mg | INHALATION_SOLUTION | Freq: Four times a day (QID) | RESPIRATORY_TRACT | 1 refills | Status: DC | PRN

## 2021-01-11 NOTE — Telephone Encounter (Signed)
Spoke with Gerald Stabs (pt spouse-on DPR) who states this is covered under pt medicare and that shes taking a copy of chucks insurance card to pharmacy. AS, CMA

## 2021-01-11 NOTE — Telephone Encounter (Signed)
Albuterol sent to pharmacy per [facility]. AS, CMA

## 2021-01-13 ENCOUNTER — Other Ambulatory Visit: Payer: Self-pay | Admitting: Cardiology

## 2021-01-15 NOTE — Telephone Encounter (Signed)
Bumex approved and sent

## 2021-01-17 ENCOUNTER — Telehealth: Payer: Self-pay | Admitting: Physician Assistant

## 2021-01-17 DIAGNOSIS — R062 Wheezing: Secondary | ICD-10-CM

## 2021-01-17 DIAGNOSIS — R0602 Shortness of breath: Secondary | ICD-10-CM

## 2021-01-17 MED ORDER — PREDNISONE 20 MG PO TABS
ORAL_TABLET | ORAL | 0 refills | Status: DC
Start: 2021-01-17 — End: 2021-01-21

## 2021-01-17 NOTE — Telephone Encounter (Signed)
Called patient. Overall feeling better but has trouble with sleeping at night and breathing due to congestion. States not able to bring anything up.  Advised to continue with home supportive care with nebulizer treatments, Mucinex, complete antibiotic and will send an extended course of prednisone.   Lorrene Reid, PA-C

## 2021-01-17 NOTE — Telephone Encounter (Signed)
Patient was in the past week for a sinus infection. His chest is still congested and he can't cough up the congestion. He is feeling somewhat better he states though. He cannot sleep in a bed as when he lays down to sleep he has trouble breathing. He has been sleeping in a chair to sleep due to that. His antibiotics end tomorrow he states. Please advise, thanks.

## 2021-01-17 NOTE — Addendum Note (Signed)
Addended by: Lorrene Reid on: 01/17/2021 02:40 PM   Modules accepted: Orders

## 2021-01-21 ENCOUNTER — Ambulatory Visit
Admission: EM | Admit: 2021-01-21 | Discharge: 2021-01-21 | Disposition: A | Discharge status: Home / Self Care | Payer: Medicare Other | Attending: Emergency Medicine | Admitting: Emergency Medicine

## 2021-01-21 ENCOUNTER — Other Ambulatory Visit: Payer: Self-pay

## 2021-01-21 ENCOUNTER — Encounter: Payer: Self-pay | Admitting: *Deleted

## 2021-01-21 ENCOUNTER — Ambulatory Visit (INDEPENDENT_AMBULATORY_CARE_PROVIDER_SITE_OTHER): Payer: Medicare Other

## 2021-01-21 DIAGNOSIS — J22 Unspecified acute lower respiratory infection: Secondary | ICD-10-CM

## 2021-01-21 DIAGNOSIS — R059 Cough, unspecified: Secondary | ICD-10-CM

## 2021-01-21 DIAGNOSIS — R0989 Other specified symptoms and signs involving the circulatory and respiratory systems: Secondary | ICD-10-CM

## 2021-01-21 MED ORDER — ALBUTEROL SULFATE HFA 108 (90 BASE) MCG/ACT IN AERS
2.0000 | INHALATION_SPRAY | Freq: Once | RESPIRATORY_TRACT | Status: AC
  Administered 2021-01-21: 2 via RESPIRATORY_TRACT

## 2021-01-21 MED ORDER — AZITHROMYCIN 250 MG PO TABS: ORAL_TABLET | ORAL | 0 refills | Status: DC

## 2021-01-21 MED ORDER — METHYLPREDNISOLONE 4 MG PO TBPK: ORAL_TABLET | ORAL | 0 refills | Status: DC

## 2021-01-21 MED ORDER — IPRATROPIUM-ALBUTEROL 0.5-2.5 (3) MG/3ML IN SOLN: 3.0000 mL | Freq: Four times a day (QID) | RESPIRATORY_TRACT | 0 refills | Status: DC | PRN

## 2021-01-21 MED ORDER — GUAIFENESIN-CODEINE 100-10 MG/5ML PO SOLN: 5.0000 mL | Freq: Every evening | ORAL | 0 refills | Status: DC | PRN

## 2021-01-21 MED ORDER — AMOXICILLIN-POT CLAVULANATE 875-125 MG PO TABS: 1.0000 | ORAL_TABLET | Freq: Two times a day (BID) | ORAL | 0 refills | Status: AC

## 2021-01-21 NOTE — ED Triage Notes (Signed)
C/O sinus congestion and chest congestion x 2 wks.  States thought he may have been improving, but started worsening last night, and feels like he cannot expectorate chest congestion.  Denies fevers. States has done 2 courses steroids, 1 steroid injection, and 1 course abx over past 2 wks.  Taking Mucinex, albuterol without relief.

## 2021-01-21 NOTE — ED Provider Notes (Signed)
[facility]    CSN: [id number] Arrival date & time: 01/21/21  1309      History   Chief Complaint Chief Complaint  Patient presents with  . Cough  . Facial Pain    HPI Joel Dominguez is a 70 y.o. male history of A. fib, aortic stenosis, CHF, pulmonary hypertension, presenting today for evaluation of congestion.  Reports symptoms x2 weeks.  Continued chest congestion cough shortness of breath wheezing and difficulty sleeping due to mucus.  Has received course of prednisone 40 mg daily; Solu-Medrol 40 mg initially, doxycycline, Mucinex and nebulizers without full relief of symptoms.  Continues to have chest pressure sensation increase in chest but cannot cough up.  HPI  Past Medical History:  Diagnosis Date  . A-fib 10/08/2016  . Aortic stenosis 11/06/2017  . Atherosclerotic heart disease of native coronary artery with unspecified angina pectoris ([place]) 10/08/2016  . Cellulitis 11/2019   right lower extremity  . CHF (congestive heart failure) ([place])   . Chronic anticoagulation 10/08/2016  . Chronic combined systolic and diastolic heart failure ([place]) 10/08/2016   EF range 35-45% in 2016  . Chronic congestive heart failure ([place])   . Chronic ulcer of toe ([place]) 08/04/2019  . Coagulation disorder ([place]) 04/30/2019  . Foot drop- R 10/08/2016   Patient known foot drop for many years due to lumbar spinal stenosis.-Over the past couple of months symptoms have gotten worse.  He feels he is tripping more and cannot lift his foot up as much. Toes are curling and calluses on end of toes.    - Did see spinal surgeon many yrs ago.  Declined sx at that time.     . Generalized OA 10/08/2016   Ortho doc-  [facility]. [facility] in [place]- both knees replaced 08, 09.     . H/O noncompliance with medical treatment, presenting hazards to health 10/22/2017  . Heart disease   . Hypertension   . Hypertensive heart disease with heart failure ([place]) 11/10/2017  . Longstanding persistent atrial fibrillation  ([place])   . Obesity, Class III, BMI 40-49.9 (morbid obesity) ([place]) 10/08/2016  . OSA (obstructive sleep apnea) 10/08/2016   Bi-PAP nitely- been 20+ yrs now  . Osteoarthritis 11/19/2017  . Pulmonary hypertension ([place]) 10/08/2016  . 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 fascicular block) 11/07/2017  . Sleep apnea with use of continuous positive airway pressure (CPAP) 10/08/2016   Bi-PAP nitely- been 20+ yrs now  . Spinal stenosis, lumbar region, with neurogenic claudication 10/08/2016   Back specialist in [place]-    only surgeon he would use- pt declined referral here for back pain mgt    . Stroke [facility])     Patient Active Problem List   Diagnosis Date Noted  . Hypertension   . Heart disease   . CHF (congestive heart failure) ([place])   . Long term current use of diuretic 12/01/2019  . Anemia 12/01/2019  . Heme positive stool 11/17/2019  . Physical debility 11/11/2019  . Acute blood loss anemia 11/05/2019  . Cellulitis 11/2019  . Chronic congestive heart failure ([place])   . Longstanding persistent atrial fibrillation ([place])   . Chronic foot ulcer ([place]) 08/08/2019  . Prediabetes 08/08/2019  . Left leg weakness 08/08/2019  . High risk medication use 08/08/2019  . Chronic ulcer of toe ([place]) 08/04/2019  . Pain due to onychomycosis of toenails of both feet 04/30/2019  . Coagulation disorder ([place]) 04/30/2019  . Chronic bilateral low back pain with right-sided sciatica  03/03/2018  . Lumbosacral radiculopathy 01/22/2018  . Osteoarthritis 11/19/2017  . Vitamin D deficiency 11/19/2017  . Positive for microalbuminuria 11/19/2017  . Hypertensive heart disease with heart failure ([place]) 11/10/2017  . 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 fascicular block) 11/07/2017  . Aortic stenosis 11/06/2017  . H/O noncompliance with medical treatment, presenting hazards to health 10/22/2017  . h/o Stroke 10/08/2016  . Atherosclerotic heart disease of native coronary artery with unspecified angina  pectoris ([place]) 10/08/2016  . Sleep apnea with use of continuous positive airway pressure (CPAP) 10/08/2016  . Chronic combined systolic and diastolic heart failure ([place]) 10/08/2016  . Generalized OA 10/08/2016  . Foot drop- R 10/08/2016  . Lumbar spinal stenosis 10/08/2016  . Pulmonary hypertension ([place]) 10/08/2016  . Chronic atrial fibrillation ([place]) 10/08/2016  . Chronic anticoagulation 10/08/2016  . Obesity, Class III, BMI 40-49.9 (morbid obesity) ([place]) 10/08/2016  . Spinal stenosis, lumbar region, with neurogenic claudication 10/08/2016  . OSA (obstructive sleep apnea) 10/08/2016  . A-fib 10/08/2016    Past Surgical History:  Procedure Laterality Date  . I & D EXTREMITY Right 10/30/2019   Procedure: IRRIGATION AND DEBRIDEMENT EXTREMITY, right leg possible wound vac placement;  Surgeon: Marchia Bond, MD;  Location: [facility];  Service: Orthopedics;  Laterality: Right;  . REPLACEMENT TOTAL KNEE Bilateral   . TIBIA FRACTURE SURGERY    . VITRECTOMY PARS PLANA 23 GAUGE FOR ENDOPHTHALMITIS Left 03/04/2020   Procedure: VITRECTOMY PARS PLANA 23 GAUGE FOR ENDOPHTHALMITIS;  Surgeon: Jalene Mullet, MD;  Location: [facility];  Service: Ophthalmology;  Laterality: Left;       Home Medications    Prior to Admission medications   Medication Sig Start Date End Date Taking? Authorizing Provider  albuterol (PROVENTIL) (2.5 MG/3ML) 0.083% nebulizer solution Take 3 mLs (2.5 mg total) by nebulization every 6 (six) hours as needed for wheezing or shortness of breath. 01/11/21  Yes Abonza, Maritza, PA-C  amoxicillin-clavulanate (AUGMENTIN) 875-125 MG tablet Take 1 tablet by mouth every 12 (twelve) hours for 7 days. 01/21/21 01/28/21 Yes Misha Vanoverbeke C, PA-C  apixaban (ELIQUIS) 5 MG TABS tablet Take 1 tablet (5 mg total) by mouth 2 (two) times daily. 12/05/20  Yes Richardo Priest, MD  [place] Saline Nasal No-Drip GEL Place 1 spray into the nose at bedtime. 04/04/20  Yes Hall-Potvin, [place], PA-C  azithromycin  (ZITHROMAX) 250 MG tablet Take first 2 tablets together on day 1, then 1 tab for the following 4 days until finished. 01/21/21  Yes Aesha Agrawal C, PA-C  B Complex-C (B-COMPLEX WITH VITAMIN C) tablet Take 1 tablet by mouth daily. 11/17/19  Yes Love, Ivan Anchors, PA-C  bumetanide (BUMEX) 1 MG tablet TAKE 1 TABLET BY MOUTH EVERY DAY AND TAKE AN ADDITIONAL 1 TABLET ON MONDAY, [facility] AND FRIDAY 01/15/21  Yes Richardo Priest, MD  carvedilol (COREG) 12.5 MG tablet Take 1 tablet (12.5 mg total) by mouth 2 (two) times daily with a meal. 12/05/20  Yes Richardo Priest, MD  fluticasone (FLONASE) 50 MCG/ACT nasal spray Place 1 spray into both nostrils daily. 04/04/20  Yes Hall-Potvin, [place], PA-C  guaiFENesin-codeine 100-10 MG/5ML syrup Take 5-10 mLs by mouth at bedtime as needed for cough. 01/21/21  Yes Serenity Batley C, PA-C  HYDROcodone-acetaminophen (NORCO) 10-325 MG tablet Take 1 tablet by mouth every 8 (eight) hours as needed. 02/28/20  Yes [provider]  ipratropium-albuterol (DUONEB) 0.5-2.5 (3) MG/3ML SOLN Take 3 mLs by nebulization every 6 (six) hours as needed. 01/21/21  Yes  Kaedin Hicklin C, PA-C  methylPREDNISolone (MEDROL DOSEPAK) 4 MG TBPK tablet Take as directed 01/21/21  Yes Norena Bratton C, PA-C  Multiple Vitamin (MULTIVITAMIN) tablet Take 1 tablet by mouth daily.   Yes [provider]  Vitamin D, Cholecalciferol, 50 MCG (2000 UT) CAPS Take 50 mcg by mouth daily.   Yes [provider]  Vitamin D, Ergocalciferol, (DRISDOL) 1.25 MG (50000 UNIT) CAPS capsule Take 1 capsule (50,000 Units total) by mouth every 7 (seven) days. 08/09/20  Yes Abonza, Maritza, PA-C  hydrocerin (EUCERIN) CREA Apply 1 application topically 2 (two) times daily. 11/17/19   Love, Ivan Anchors, PA-C  spironolactone (ALDACTONE) 25 MG tablet Take 1 tablet (25 mg total) by mouth daily. 12/05/20   Richardo Priest, MD    Family History Family History  Problem Relation Age of Onset  . Congestive Heart Failure  Mother   . Hypertension Mother   . Diabetes Mother   . Cancer Father        lung  . Congestive Heart Failure Sister   . Hypertension Sister   . Hypertension Brother   . Hypertension Sister   . Diabetes Brother     Social History Social History   Tobacco Use  . Smoking status: Never Smoker  . Smokeless tobacco: Never Used  Vaping Use  . Vaping Use: Never used  Substance Use Topics  . Alcohol use: Not Currently  . Drug use: No     Allergies   Cephalexin and Dilaudid [hydromorphone]   Review of Systems Review of Systems  Constitutional: Negative for activity change, appetite change, chills, fatigue and fever.  HENT: Positive for congestion and rhinorrhea. Negative for ear pain, sinus pressure, sore throat and trouble swallowing.   Eyes: Negative for discharge and redness.  Respiratory: Positive for cough. Negative for chest tightness and shortness of breath.   Cardiovascular: Negative for chest pain.  Gastrointestinal: Negative for abdominal pain, diarrhea, nausea and vomiting.  Musculoskeletal: Negative for myalgias.  Skin: Negative for rash.  Neurological: Negative for dizziness, light-headedness and headaches.     Physical Exam Triage Vital Signs ED Triage Vitals  Enc Vitals Group     BP      Pulse      Resp      Temp      Temp src      SpO2      Weight      Height      Head Circumference      Peak Flow      Pain Score      Pain Loc      Pain Edu?      Excl. in [place]?    No data found.  Updated Vital Signs BP (!) 169/101 (BP Location: Left Arm)   Pulse 92   Temp 97.9 F (36.6 C) (Oral)   Resp 20   SpO2 95%   Visual Acuity Right Eye Distance:   Left Eye Distance:   Bilateral Distance:    Right Eye Near:   Left Eye Near:    Bilateral Near:     Physical Exam Vitals and nursing note reviewed.  Constitutional:      Appearance: He is well-developed.     Comments: No acute distress  HENT:     Head: Normocephalic and atraumatic.     Ears:      Comments: Bilateral ears without tenderness to palpation of external auricle, tragus and mastoid, EAC's without erythema or swelling, TM's with good bony  landmarks and cone of light. Non erythematous.     Nose: Nose normal.     Mouth/Throat:     Comments: Oral mucosa pink and moist, no tonsillar enlargement or exudate. Posterior pharynx patent and nonerythematous, no uvula deviation or swelling. Normal phonation. Eyes:     Conjunctiva/sclera: Conjunctivae normal.  Cardiovascular:     Rate and Rhythm: Normal rate and regular rhythm.  Pulmonary:     Effort: Pulmonary effort is normal. No respiratory distress.     Comments: Breathing comfortably at rest, inspiratory and expiratory wheezing and rhonchi noted throughout bilateral lung fields, coarse cough Abdominal:     General: There is no distension.  Musculoskeletal:        General: Normal range of motion.     Cervical back: Neck supple.  Skin:    General: Skin is warm and dry.  Neurological:     Mental Status: He is alert and oriented to person, place, and time.      UC Treatments / Results  Labs (all labs ordered are listed, but only abnormal results are displayed) Labs Reviewed - No data to display  EKG   Radiology DG Chest 2 View  Result Date: 01/21/2021 CLINICAL DATA:  Cough and congestion for 2 weeks. EXAM: CHEST - 2 VIEW COMPARISON:  October 24, 2019 FINDINGS: The heart size and mediastinal contours are stable. There is no focal infiltrate, pulmonary edema, or pleural effusion. The visualized skeletal structures are stable. IMPRESSION: No active cardiopulmonary disease. Electronically Signed   By: Abelardo Diesel M.D.   On: 01/21/2021 14:10    Procedures Procedures (including critical care time)  Medications Ordered in UC Medications  albuterol (VENTOLIN HFA) 108 (90 Base) MCG/ACT inhaler 2 puff (has no administration in time range)    Initial Impression / Assessment and Plan / UC Course  I have reviewed the  triage vital signs and the nursing notes.  Pertinent labs & imaging results that were available during my care of the patient were reviewed by me and considered in my medical decision making (see chart for details).     X-ray negative for pneumonia, will continue treatment for bronchitis, switching from prednisone to Medrol Dosepak as alternative, continue Mucinex, Robitussin with codeine at bedtime, albuterol inhaler provided and provided DuoNebs as alternative to plain albuterol.  Given length of symptoms and patient's medical history did opt to go ahead and try alternative antibiotic therapy as well and covering with Augmentin and azithromycin.  Continue to monitor breathing and swelling while on steroids.  Discussed strict return precautions. Patient verbalized understanding and is agreeable with plan.  Final Clinical Impressions(s) / UC Diagnoses   Final diagnoses:  Lower respiratory infection (e.g., bronchitis, pneumonia, pneumonitis, pulmonitis)     Discharge Instructions     Begin Augmentin twice daily for 1 week Azithromycin-2 tablets today, 1 tablet for the following 4 days Albuterol inhaler nebulizers as needed for shortness of breath chest tightness and wheezing Continue Mucinex during the day, Robitussin with codeine at the bedtime Medrol Dosepak 6, 5, 4, 3, 2, 1-take with food   Please follow-up if not improving or worsening     ED Prescriptions    Medication Sig Dispense Auth. Provider   amoxicillin-clavulanate (AUGMENTIN) 875-125 MG tablet Take 1 tablet by mouth every 12 (twelve) hours for 7 days. 14 tablet Delio Slates C, PA-C   azithromycin (ZITHROMAX) 250 MG tablet Take first 2 tablets together on day 1, then 1 tab for the following 4 days until finished.  6 tablet Azharia Surratt C, PA-C   guaiFENesin-codeine 100-10 MG/5ML syrup Take 5-10 mLs by mouth at bedtime as needed for cough. 120 mL Anivea Velasques C, PA-C   methylPREDNISolone (MEDROL DOSEPAK) 4 MG TBPK  tablet Take as directed 21 tablet Zarria Towell C, PA-C   ipratropium-albuterol (DUONEB) 0.5-2.5 (3) MG/3ML SOLN Take 3 mLs by nebulization every 6 (six) hours as needed. 180 mL Ishmail Mcmanamon, [place] C, PA-C     PDMP not reviewed this encounter.   Janith Lima, PA-C 01/21/21 1430

## 2021-01-21 NOTE — Discharge Instructions (Addendum)
Begin Augmentin twice daily for 1 week Azithromycin-2 tablets today, 1 tablet for the following 4 days Albuterol inhaler nebulizers as needed for shortness of breath chest tightness and wheezing Continue Mucinex during the day, Robitussin with codeine at the bedtime Medrol Dosepak 6, 5, 4, 3, 2, 1-take with food   Please follow-up if not improving or worsening

## 2021-01-22 ENCOUNTER — Other Ambulatory Visit: Payer: Self-pay | Admitting: Physician Assistant

## 2021-01-22 DIAGNOSIS — E559 Vitamin D deficiency, unspecified: Secondary | ICD-10-CM

## 2021-01-24 DIAGNOSIS — Z5181 Encounter for therapeutic drug level monitoring: Secondary | ICD-10-CM | POA: Diagnosis not present

## 2021-01-24 DIAGNOSIS — Z79899 Other long term (current) drug therapy: Secondary | ICD-10-CM | POA: Diagnosis not present

## 2021-01-29 ENCOUNTER — Other Ambulatory Visit: Payer: Self-pay

## 2021-01-29 ENCOUNTER — Ambulatory Visit (INDEPENDENT_AMBULATORY_CARE_PROVIDER_SITE_OTHER): Payer: Medicare Other | Admitting: Podiatry

## 2021-01-29 DIAGNOSIS — I83015 Varicose veins of right lower extremity with ulcer other part of foot: Secondary | ICD-10-CM

## 2021-01-29 DIAGNOSIS — L97519 Non-pressure chronic ulcer of other part of right foot with unspecified severity: Secondary | ICD-10-CM

## 2021-01-29 DIAGNOSIS — R600 Localized edema: Secondary | ICD-10-CM | POA: Diagnosis not present

## 2021-01-29 MED ORDER — CLOTRIMAZOLE-BETAMETHASONE 1-0.05 % EX CREA: 1.0000 "application " | TOPICAL_CREAM | Freq: Two times a day (BID) | CUTANEOUS | 1 refills | Status: DC

## 2021-01-29 NOTE — Progress Notes (Signed)
HPI: 70 y.o. male presenting today for concern regarding discoloration and darkness to the right forefoot.  Patient states that since he was last seen here in the office in 2020 that he developed necrotizing cellulitis to the right lower extremity.  He ultimately had surgery and he states that a golf ball sized lymph node was removed from the posterior aspect of the leg RLE.  He states that since surgery he has had discoloration and swelling to his RLE.  They present for further treatment and evaluation  Past Medical History:  Diagnosis Date  . A-fib 10/08/2016  . Aortic stenosis 11/06/2017  . Atherosclerotic heart disease of native coronary artery with unspecified angina pectoris ([place]) 10/08/2016  . Cellulitis 11/2019   right lower extremity  . CHF (congestive heart failure) ([place])   . Chronic anticoagulation 10/08/2016  . Chronic combined systolic and diastolic heart failure ([place]) 10/08/2016   EF range 35-45% in 2016  . Chronic congestive heart failure ([place])   . Chronic ulcer of toe ([place]) 08/04/2019  . Coagulation disorder ([place]) 04/30/2019  . Foot drop- R 10/08/2016   Patient known foot drop for many years due to lumbar spinal stenosis.-Over the past couple of months symptoms have gotten worse.  He feels he is tripping more and cannot lift his foot up as much. Toes are curling and calluses on end of toes.    - Did see spinal surgeon many yrs ago.  Declined sx at that time.     . Generalized OA 10/08/2016   Ortho doc-  [facility]. [facility] in [place]- both knees replaced 08, 09.     . H/O noncompliance with medical treatment, presenting hazards to health 10/22/2017  . Heart disease   . Hypertension   . Hypertensive heart disease with heart failure ([place]) 11/10/2017  . Longstanding persistent atrial fibrillation ([place])   . Obesity, Class III, BMI 40-49.9 (morbid obesity) ([place]) 10/08/2016  . OSA (obstructive sleep apnea) 10/08/2016   Bi-PAP nitely- been 20+ yrs now  . Osteoarthritis 11/19/2017  . Pulmonary  hypertension ([place]) 10/08/2016  . 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 fascicular block) 11/07/2017  . Sleep apnea with use of continuous positive airway pressure (CPAP) 10/08/2016   Bi-PAP nitely- been 20+ yrs now  . Spinal stenosis, lumbar region, with neurogenic claudication 10/08/2016   Back specialist in [place]-    only surgeon he would use- pt declined referral here for back pain mgt    . Stroke [facility])      Physical Exam: General: The patient is alert and oriented x3 in no acute distress.  Dermatology: Skin is warm, dry and supple bilateral lower extremities. Negative for open lesions or macerations.  There is some diffuse hyperkeratosis of skin with peeling and xerosis bilateral weightbearing surfaces of the feet.  Intermittent occasional pruritus noted.  Vascular: Palpable pedal pulses bilaterally.  Moderate edema noted.  Skin is cool to touch.  Hemosiderin deposits with discoloration noted to the right lower extremity.  Neurological: Epicritic and protective threshold grossly intact bilaterally.   Musculoskeletal Exam: Range of motion within normal limits to all pedal and ankle joints bilateral. Muscle strength 5/5 in all groups bilateral.   Assessment: 1.  Venous insufficiency right lower extremity without ulceration 2.  Tinea pedis bilateral feet   Plan of Care:  1. Patient evaluated.  2.  Prescription for Lotrisone cream apply 2 times daily bilateral feet 3.  Continue wearing below-knee compression hose daily. 4.  Reassured the patient and  his spouse that I do not find anything on today's visit that is concerning or alarming.  Patient has chronic venous insufficiency without any ulcers so continue conservative management including compression hose daily 5.  Return to clinic as needed      Edrick Kins, DPM [facility]  Dr. Edrick Kins, DPM    [address], [place] [address]                Office  [phone]  Fax [phone]

## 2021-01-30 DIAGNOSIS — H21542 Posterior synechiae (iris), left eye: Secondary | ICD-10-CM | POA: Diagnosis not present

## 2021-01-30 DIAGNOSIS — H35361 Drusen (degenerative) of macula, right eye: Secondary | ICD-10-CM | POA: Diagnosis not present

## 2021-01-30 DIAGNOSIS — H54415A Blindness right eye category 5, normal vision left eye: Secondary | ICD-10-CM | POA: Diagnosis not present

## 2021-01-30 DIAGNOSIS — H43391 Other vitreous opacities, right eye: Secondary | ICD-10-CM | POA: Diagnosis not present

## 2021-01-30 DIAGNOSIS — H25813 Combined forms of age-related cataract, bilateral: Secondary | ICD-10-CM | POA: Diagnosis not present

## 2021-01-30 DIAGNOSIS — H35031 Hypertensive retinopathy, right eye: Secondary | ICD-10-CM | POA: Diagnosis not present

## 2021-03-08 ENCOUNTER — Encounter: Payer: Self-pay | Admitting: Physician Assistant

## 2021-03-08 DIAGNOSIS — G8929 Other chronic pain: Secondary | ICD-10-CM

## 2021-03-08 DIAGNOSIS — M5441 Lumbago with sciatica, right side: Secondary | ICD-10-CM

## 2021-03-19 DIAGNOSIS — M21371 Foot drop, right foot: Secondary | ICD-10-CM | POA: Diagnosis not present

## 2021-03-19 DIAGNOSIS — M4807 Spinal stenosis, lumbosacral region: Secondary | ICD-10-CM | POA: Diagnosis not present

## 2021-03-19 DIAGNOSIS — M5459 Other low back pain: Secondary | ICD-10-CM | POA: Diagnosis not present

## 2021-03-22 DIAGNOSIS — M21371 Foot drop, right foot: Secondary | ICD-10-CM | POA: Diagnosis not present

## 2021-03-22 DIAGNOSIS — M5459 Other low back pain: Secondary | ICD-10-CM | POA: Diagnosis not present

## 2021-03-22 DIAGNOSIS — M4807 Spinal stenosis, lumbosacral region: Secondary | ICD-10-CM | POA: Diagnosis not present

## 2021-03-27 DIAGNOSIS — M4807 Spinal stenosis, lumbosacral region: Secondary | ICD-10-CM | POA: Diagnosis not present

## 2021-03-27 DIAGNOSIS — M21371 Foot drop, right foot: Secondary | ICD-10-CM | POA: Diagnosis not present

## 2021-03-27 DIAGNOSIS — M5459 Other low back pain: Secondary | ICD-10-CM | POA: Diagnosis not present

## 2021-03-29 DIAGNOSIS — M5459 Other low back pain: Secondary | ICD-10-CM | POA: Diagnosis not present

## 2021-03-29 DIAGNOSIS — M21371 Foot drop, right foot: Secondary | ICD-10-CM | POA: Diagnosis not present

## 2021-03-29 DIAGNOSIS — M4807 Spinal stenosis, lumbosacral region: Secondary | ICD-10-CM | POA: Diagnosis not present

## 2021-04-02 NOTE — Progress Notes (Signed)
Cardiology Office Note:    Date:  04/03/2021   ID:  Joel Dominguez, DOB 1951/07/09, MRN [id number]  PCP:  Joel Reid, PA-C  Cardiologist:  Joel More, MD    Referring MD: Joel Reid, PA-C    ASSESSMENT:    1. Hypertensive heart disease with heart failure ([place])   2. Longstanding persistent atrial fibrillation ([employer])   3. 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 fascicular block)   4. Chronic anticoagulation   5. Nonrheumatic aortic valve stenosis    PLAN:    In order of problems listed above:  1. He continues to do well heart failure is compensated we will continue his current loop and distal diuretic recheck renal function today. 2. Stable rate controlled continue beta-blocker and his anticoagulant 3. Stable EKG pattern 4. Stable consider echocardiogram after next visit   Next appointment: 6 months   Medication Adjustments/Labs and Tests Ordered: Current medicines are reviewed at length with the patient today.  Concerns regarding medicines are outlined above.  Orders Placed This Encounter  Procedures  . Basic metabolic panel  . EKG 12-Lead   No orders of the defined types were placed in this encounter.   No chief complaint on file.   History of Present Illness:    Joel Dominguez is a 70 y.o. male with a hx of longstanding permanent atrial fibrillation mild aortic stenosis chronic diastolic heart failure bifascicular heart block with right bundle branch block left anterior hemiblock hypertensive heart disease and chronic anticoagulation last seen 09/29/2020. Compliance with diet, lifestyle and medications: Yes  Overall is doing better he is Dominguez active and is not having edema shortness of breath chest pain palpitation or syncope. He tracks heart rate with his watch and typically less than 110 bpm at rest.  Most recent labs 10/14/2020 cholesterol 162 HDL 39 LDL 89 triglycerides 197 A1c 5.7% creatinine 1.90   Echo cardiogram 07/05/2020 shows EF 45  to 50% global hypokinesia with mild aortic stenosis mean gradient 10 mmHg and mild enlargement of the ascending aorta 40 mm.  Calculated EF 51% I reviewed the images myself I would have read ejection fraction is low normal 50 to 55% unchanged from the last echocardiogram 2020.  Past Medical History:  Diagnosis Date  . A-fib 10/08/2016  . Aortic stenosis 11/06/2017  . Atherosclerotic heart disease of native coronary artery with unspecified angina pectoris ([place]) 10/08/2016  . Cellulitis 11/2019   right lower extremity  . CHF (congestive heart failure) ([place])   . Chronic anticoagulation 10/08/2016  . Chronic combined systolic and diastolic heart failure ([place]) 10/08/2016   EF range 35-45% in 2016  . Chronic congestive heart failure ([place])   . Chronic ulcer of toe ([place]) 08/04/2019  . Coagulation disorder ([place]) 04/30/2019  . Foot drop- R 10/08/2016   Patient known foot drop for many years due to lumbar spinal stenosis.-Over the past couple of months symptoms have gotten worse.  He feels he is tripping Dominguez and cannot lift his foot up as much. Toes are curling and calluses on end of toes.    - Did see spinal surgeon many yrs ago.  Declined sx at that time.     . Generalized OA 10/08/2016   Ortho doc-  Dr. Boston Dominguez in [place]- both knees replaced 08, 09.     . H/O noncompliance with medical treatment, presenting hazards to health 10/22/2017  . Heart disease   . Hypertension   . Hypertensive heart disease with heart failure ([place]) 11/10/2017  .  Longstanding persistent atrial fibrillation ([place])   . Obesity, Class III, BMI 40-49.9 (morbid obesity) ([place]) 10/08/2016  . OSA (obstructive sleep apnea) 10/08/2016   Bi-PAP nitely- been 20+ yrs now  . Osteoarthritis 11/19/2017  . Pulmonary hypertension ([place]) 10/08/2016  . 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 fascicular block) 11/07/2017  . Sleep apnea with use of continuous positive airway pressure (CPAP) 10/08/2016   Bi-PAP nitely- been 20+ yrs now  .  Spinal stenosis, lumbar region, with neurogenic claudication 10/08/2016   Back specialist in [place]-    only surgeon he would use- pt declined referral here for back pain mgt    . Stroke [facility])     Past Surgical History:  Procedure Laterality Date  . I & D EXTREMITY Right 10/30/2019   Procedure: IRRIGATION AND DEBRIDEMENT EXTREMITY, right leg possible wound vac placement;  Surgeon: Joel Bond, MD;  Location: [place];  Dominguez: Orthopedics;  Laterality: Right;  . REPLACEMENT TOTAL KNEE Bilateral   . TIBIA FRACTURE SURGERY    . VITRECTOMY PARS PLANA 23 GAUGE FOR ENDOPHTHALMITIS Left 03/04/2020   Procedure: VITRECTOMY PARS PLANA 23 GAUGE FOR ENDOPHTHALMITIS;  Surgeon: Joel Mullet, MD;  Location: [place];  Dominguez: Ophthalmology;  Laterality: Left;    Current Medications: Current Meds  Medication Sig  . apixaban (ELIQUIS) 5 MG TABS tablet Take 1 tablet (5 mg total) by mouth 2 (two) times daily.  . B Complex-C (B-COMPLEX WITH VITAMIN C) tablet Take 1 tablet by mouth daily.  . bumetanide (BUMEX) 1 MG tablet TAKE 1 TABLET BY MOUTH EVERY DAY AND TAKE AN ADDITIONAL 1 TABLET ON MONDAY, WEDNESDAY AND FRIDAY  . carvedilol (COREG) 12.5 MG tablet Take 1 tablet (12.5 mg total) by mouth 2 (two) times daily with a meal.  . hydrocerin (EUCERIN) CREA Apply 1 application topically 2 (two) times daily.  Marland Kitchen HYDROcodone-acetaminophen (NORCO) 10-325 MG tablet Take 1 tablet by mouth every 8 (eight) hours as needed.  . Multiple Vitamin (MULTIVITAMIN) tablet Take 1 tablet by mouth daily.  Marland Kitchen spironolactone (ALDACTONE) 25 MG tablet Take 1 tablet (25 mg total) by mouth daily.  . Vitamin D, Cholecalciferol, 50 MCG (2000 UT) CAPS Take 50 mcg by mouth daily.  . Vitamin D, Ergocalciferol, (DRISDOL) 1.25 MG (50000 UNIT) CAPS capsule TAKE 1 CAPSULE BY MOUTH EVERY 7 DAYS.     Allergies:   Cephalexin and Dilaudid [hydromorphone]   Social History   Socioeconomic History  . Marital status: Married    Spouse name: Not on file   . Number of children: Not on file  . Years of education: Not on file  . Highest education level: Not on file  Occupational History  . Not on file  Tobacco Use  . Smoking status: Never Smoker  . Smokeless tobacco: Never Used  Vaping Use  . Vaping Use: Never used  Substance and Sexual Activity  . Alcohol use: Not Currently  . Drug use: No  . Sexual activity: Not on file  Other Topics Concern  . Not on file  Social History Narrative  . Not on file   Social Determinants of Health   Financial Resource Strain: Not on file  Food Insecurity: Not on file  Transportation Needs: Not on file  Physical Activity: Not on file  Stress: Not on file  Social Connections: Not on file     Family History: The patient's family history includes Cancer in his father; Congestive Heart Failure in his mother and sister; Diabetes in his brother and  mother; Hypertension in his brother, mother, sister, and sister. ROS:   Please see the history of present illness.    All other systems reviewed and are negative.  EKGs/Labs/Other Studies Reviewed:    The following studies were reviewed today:  EKG:  EKG ordered today and personally reviewed.  The ekg ordered today demonstrates atrial fibrillation bifascicular heart block controlled ventricular rate.  Recent Labs: 10/02/2020: NT-Pro BNP 356 10/16/2020: ALT 31; BUN 19; Creatinine, Ser 1.11; Hemoglobin 14.5; Platelets 294; Potassium 4.2; Sodium 140; TSH 1.090  Recent Lipid Panel    Component Value Date/Time   CHOL 162 10/16/2020 0856   TRIG 197 (H) 10/16/2020 0856   HDL 39 (L) 10/16/2020 0856   CHOLHDL 4.2 10/16/2020 0856   CHOLHDL 4.3 10/08/2016 1144   VLDL 45 (H) 10/08/2016 1144   LDLCALC 89 10/16/2020 0856    Physical Exam:    VS:  BP (!) 150/81   Pulse 79   Ht 6' (1.829 m)   Wt (!) 319 lb 6.4 oz (144.9 kg)   SpO2 96%   BMI 43.32 kg/m     Wt Readings from Last 3 Encounters:  04/03/21 (!) 319 lb 6.4 oz (144.9 kg)  01/09/21 (!) 330  lb (149.7 kg)  10/19/20 (!) 320 lb (145.2 kg)     GEN:  Well nourished, well developed in no acute distress HEENT: Normal NECK: No JVD; No carotid bruits LYMPHATICS: No lymphadenopathy CARDIAC: Irregular rhythm variable first heart sound 1/6 aortic outflow murmur no  rubs, gallops RESPIRATORY:  Clear to auscultation without rales, wheezing or rhonchi  ABDOMEN: Soft, non-tender, non-distended MUSCULOSKELETAL:  No edema; No deformity  SKIN: Warm and dry NEUROLOGIC:  Alert and oriented x 3 PSYCHIATRIC:  Normal affect    Signed, Joel More, MD  04/03/2021 10:43 AM    [place]

## 2021-04-03 ENCOUNTER — Other Ambulatory Visit: Payer: Self-pay

## 2021-04-03 ENCOUNTER — Encounter: Payer: Self-pay | Admitting: Cardiology

## 2021-04-03 ENCOUNTER — Ambulatory Visit (INDEPENDENT_AMBULATORY_CARE_PROVIDER_SITE_OTHER): Payer: Medicare Other | Admitting: Cardiology

## 2021-04-03 VITALS — BP 150/81 | HR 79 | Ht 72.0 in | Wt 319.4 lb

## 2021-04-03 DIAGNOSIS — I110 Hypertensive heart disease with heart failure: Secondary | ICD-10-CM

## 2021-04-03 DIAGNOSIS — I350 Nonrheumatic aortic (valve) stenosis: Secondary | ICD-10-CM | POA: Diagnosis not present

## 2021-04-03 DIAGNOSIS — I4811 Longstanding persistent atrial fibrillation: Secondary | ICD-10-CM

## 2021-04-03 DIAGNOSIS — Z7901 Long term (current) use of anticoagulants: Secondary | ICD-10-CM | POA: Diagnosis not present

## 2021-04-03 DIAGNOSIS — I452 Bifascicular block: Secondary | ICD-10-CM

## 2021-04-03 LAB — BASIC METABOLIC PANEL
BUN/Creatinine Ratio: 16 (ref 10–24)
BUN: 17 mg/dL (ref 8–27)
CO2: 24 mmol/L (ref 20–29)
Calcium: 9.4 mg/dL (ref 8.6–10.2)
Chloride: 100 mmol/L (ref 96–106)
Creatinine, Ser: 1.06 mg/dL (ref 0.76–1.27)
Glucose: 117 mg/dL — ABNORMAL HIGH (ref 65–99)
Potassium: 4.3 mmol/L (ref 3.5–5.2)
Sodium: 139 mmol/L (ref 134–144)
eGFR: 76 mL/min/{1.73_m2} (ref >59)

## 2021-04-03 NOTE — Patient Instructions (Signed)
Medication Instructions:  Your physician recommends that you continue on your current medications as directed. Please refer to the Current Medication list given to you today.  *If you need a refill on your cardiac medications before your next appointment, please call your pharmacy*   Lab Work: Your physician recommends that you return for lab work in: TODAY BMP If you have labs (blood work) drawn today and your tests are completely normal, you will receive your results only by: MyChart Message (if you have MyChart) OR A paper copy in the mail If you have any lab test that is abnormal or we need to change your treatment, we will call you to review the results.   Testing/Procedures: None   Follow-Up: At [facility], you and your health needs are our priority.  As part of our continuing mission to provide you with exceptional heart care, we have created designated Provider Care Teams.  These Care Teams include your primary Cardiologist (physician) and Advanced Practice Providers (APPs -  Physician Assistants and Nurse Practitioners) who all work together to provide you with the care you need, when you need it.  We recommend signing up for the patient portal called "MyChart".  Sign up information is provided on this After Visit Summary.  MyChart is used to connect with patients for Virtual Visits (Telemedicine).  Patients are able to view lab/test results, encounter notes, upcoming appointments, etc.  Non-urgent messages can be sent to your provider as well.   To learn more about what you can do with MyChart, go to [email or web address].    Your next appointment:   6 month(s)  The format for your next appointment:   In Person  Provider:   Brian Munley, MD   Other Instructions   

## 2021-04-04 ENCOUNTER — Telehealth: Payer: Self-pay

## 2021-04-04 DIAGNOSIS — M4807 Spinal stenosis, lumbosacral region: Secondary | ICD-10-CM | POA: Diagnosis not present

## 2021-04-04 DIAGNOSIS — M5459 Other low back pain: Secondary | ICD-10-CM | POA: Diagnosis not present

## 2021-04-04 DIAGNOSIS — M21371 Foot drop, right foot: Secondary | ICD-10-CM | POA: Diagnosis not present

## 2021-04-04 NOTE — Telephone Encounter (Signed)
Spoke with patient regarding results and recommendation.  Patient verbalizes understanding and is agreeable to plan of care. Advised patient to call back with any issues or concerns.  

## 2021-04-04 NOTE — Telephone Encounter (Signed)
-----   Message from Richardo Priest, MD sent at 04/04/2021  7:47 AM EDT ----- Good result no changes

## 2021-04-09 DIAGNOSIS — M5459 Other low back pain: Secondary | ICD-10-CM | POA: Diagnosis not present

## 2021-04-09 DIAGNOSIS — M21371 Foot drop, right foot: Secondary | ICD-10-CM | POA: Diagnosis not present

## 2021-04-09 DIAGNOSIS — M4807 Spinal stenosis, lumbosacral region: Secondary | ICD-10-CM | POA: Diagnosis not present

## 2021-04-24 ENCOUNTER — Encounter: Payer: Self-pay | Admitting: Physician Assistant

## 2021-04-24 DIAGNOSIS — G4733 Obstructive sleep apnea (adult) (pediatric): Secondary | ICD-10-CM

## 2021-04-24 NOTE — Telephone Encounter (Signed)
Let's aske wendy about this. I have not done prescriptions for CPAP or Bi Pap before.

## 2021-04-25 NOTE — Telephone Encounter (Signed)
Followed up with the patient to request new Bi Pap machine from [facility]. Spoke with staff from [employer] and they informed me that they need clinical notes, an order, and copy of the insurance card to issue a new machine.

## 2021-04-30 NOTE — Telephone Encounter (Signed)
Faxed forms to [employer]

## 2021-05-18 ENCOUNTER — Telehealth: Payer: Self-pay | Admitting: Physician Assistant

## 2021-05-18 NOTE — Telephone Encounter (Signed)
Spoke with Wells Guiles and advised settings 12/9. AS, CMA

## 2021-05-18 NOTE — Telephone Encounter (Signed)
There is an encounter for dme continuous positive airway pressure. The order number is [id number]. The order states the demographics and order info. They need better clarification if it supplies and masks, and or a  replacement device. Thanks

## 2021-06-27 ENCOUNTER — Other Ambulatory Visit: Payer: Self-pay | Admitting: Cardiology

## 2021-08-21 ENCOUNTER — Other Ambulatory Visit: Payer: Self-pay

## 2021-08-21 ENCOUNTER — Ambulatory Visit (INDEPENDENT_AMBULATORY_CARE_PROVIDER_SITE_OTHER): Payer: Medicare Other | Admitting: Physician Assistant

## 2021-08-21 ENCOUNTER — Encounter: Payer: Self-pay | Admitting: Physician Assistant

## 2021-08-21 VITALS — BP 136/72 | HR 80 | Temp 98.0°F | Ht 72.0 in | Wt 295.0 lb

## 2021-08-21 DIAGNOSIS — G4733 Obstructive sleep apnea (adult) (pediatric): Secondary | ICD-10-CM | POA: Diagnosis not present

## 2021-08-21 DIAGNOSIS — I5042 Chronic combined systolic (congestive) and diastolic (congestive) heart failure: Secondary | ICD-10-CM | POA: Diagnosis not present

## 2021-08-21 DIAGNOSIS — I482 Chronic atrial fibrillation, unspecified: Secondary | ICD-10-CM | POA: Diagnosis not present

## 2021-08-21 DIAGNOSIS — R7303 Prediabetes: Secondary | ICD-10-CM | POA: Diagnosis not present

## 2021-08-21 DIAGNOSIS — G4730 Sleep apnea, unspecified: Secondary | ICD-10-CM

## 2021-08-21 DIAGNOSIS — E559 Vitamin D deficiency, unspecified: Secondary | ICD-10-CM

## 2021-08-21 DIAGNOSIS — Z23 Encounter for immunization: Secondary | ICD-10-CM

## 2021-08-21 DIAGNOSIS — I110 Hypertensive heart disease with heart failure: Secondary | ICD-10-CM

## 2021-08-21 NOTE — Assessment & Plan Note (Addendum)
-  Followed by Cardiology. -Echocardiogram 07/05/2020: LVEF 45-50%, mild aortic regurgitation and stenosis.  -On diuretic therapy. Will collect CMP to monitor renal function and electrolytes.

## 2021-08-21 NOTE — Assessment & Plan Note (Signed)
-  Followed by Cardiology. -On carvedilol and apixaban.

## 2021-08-21 NOTE — Patient Instructions (Signed)

## 2021-08-21 NOTE — Assessment & Plan Note (Signed)
>>  ASSESSMENT AND PLAN FOR CHRONIC COMBINED SYSTOLIC AND DIASTOLIC CHF (CONGESTIVE HEART FAILURE) (HCC) WRITTEN ON 08/21/2021  1:09 PM BY ABONZA, MARITZA, PA-C  -Followed by Cardiology. -Echocardiogram 07/05/2020: LVEF 45-50%, mild aortic regurgitation and stenosis.  -On diuretic therapy. Will collect CMP to monitor renal function and electrolytes.

## 2021-08-21 NOTE — Progress Notes (Signed)
Established Patient Office Visit  Subjective:  Patient ID: Joel Dominguez, male    DOB: Apr 21, 1951  Age: 70 y.o. MRN: [id number]  CC:  Chief Complaint  Patient presents with   Follow-up    PreDM   Hypertension    HPI Joel Dominguez presents for follow-up on hypertension and prediabetes.  HTN: Pt denies chest pain, palpitations, dizziness or worsening lower extremity swelling. Reports will take an extra dose of water pill when his weight is up and feels like is retaining more fluid. Taking medication as directed without side effects.   Prediabetes: Denies increased thirst or urination from baseline. Reports has reduced sugar intake. Has cut back on junk food. Working on weight loss.  OSA: Using new BiPAP machine. Using it every night for about 7 hours. Feels machine is some better. Settings are 9/13.    Past Medical History:  Diagnosis Date   A-fib 10/08/2016   Aortic stenosis 11/06/2017   Atherosclerotic heart disease of native coronary artery with unspecified angina pectoris ([place]) 10/08/2016   Cellulitis 11/2019   right lower extremity   CHF (congestive heart failure) ([place])    Chronic anticoagulation 10/08/2016   Chronic combined systolic and diastolic heart failure ([place]) 10/08/2016   EF range 35-45% in 2016   Chronic congestive heart failure ([place])    Chronic ulcer of toe ([place]) 08/04/2019   Coagulation disorder ([place]) 04/30/2019   Foot drop- R 10/08/2016   Patient known foot drop for many years due to lumbar spinal stenosis.-Over the past couple of months symptoms have gotten worse.  He feels he is tripping more and cannot lift his foot up as much. Toes are curling and calluses on end of toes.    - Did see spinal surgeon many yrs ago.  Declined sx at that time.      Generalized OA 10/08/2016   Ortho doc-  Dr. Boston [facility] in [place]- both knees replaced 08, 09.      H/O noncompliance with medical treatment, presenting hazards to health 10/22/2017   Heart disease    Hypertension     Hypertensive heart disease with heart failure ([place]) 11/10/2017   Longstanding persistent atrial fibrillation (HCC)    Obesity, Class III, BMI 40-49.9 (morbid obesity) ([place]) 10/08/2016   OSA (obstructive sleep apnea) 10/08/2016   Bi-PAP nitely- been 20+ yrs now   Osteoarthritis 11/19/2017   Pulmonary hypertension ([place]) 10/08/2016   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 fascicular block) 11/07/2017   Sleep apnea with use of continuous positive airway pressure (CPAP) 10/08/2016   Bi-PAP nitely- been 20+ yrs now   Spinal stenosis, lumbar region, with neurogenic claudication 10/08/2016   Back specialist in [place]-    only surgeon he would use- pt declined referral here for back pain mgt     Stroke [facility])     Past Surgical History:  Procedure Laterality Date   I & D EXTREMITY Right 10/30/2019   Procedure: IRRIGATION AND DEBRIDEMENT EXTREMITY, right leg possible wound vac placement;  Surgeon: Marchia Bond, MD;  Location: [place];  Service: Orthopedics;  Laterality: Right;   REPLACEMENT TOTAL KNEE Bilateral    TIBIA FRACTURE SURGERY     VITRECTOMY PARS PLANA 23 GAUGE FOR ENDOPHTHALMITIS Left 03/04/2020   Procedure: VITRECTOMY PARS PLANA 23 GAUGE FOR ENDOPHTHALMITIS;  Surgeon: Jalene Mullet, MD;  Location: [place];  Service: Ophthalmology;  Laterality: Left;    Family History  Problem Relation Age of Onset   Congestive Heart Failure Mother  Hypertension Mother    Diabetes Mother    Cancer Father        lung   Congestive Heart Failure Sister    Hypertension Sister    Hypertension Brother    Hypertension Sister    Diabetes Brother     Social History   Socioeconomic History   Marital status: Married    Spouse name: Not on file   Number of children: Not on file   Years of education: Not on file   Highest education level: Not on file  Occupational History   Not on file  Tobacco Use   Smoking status: Never   Smokeless tobacco: Never  Vaping Use   Vaping Use: Never used  Substance  and Sexual Activity   Alcohol use: Not Currently   Drug use: No   Sexual activity: Not on file  Other Topics Concern   Not on file  Social History Narrative   Not on file   Social Determinants of Health   Financial Resource Strain: Not on file  Food Insecurity: Not on file  Transportation Needs: Not on file  Physical Activity: Not on file  Stress: Not on file  Social Connections: Not on file  Intimate Partner Violence: Not on file    Outpatient Medications Prior to Visit  Medication Sig Dispense Refill   apixaban (ELIQUIS) 5 MG TABS tablet Take 1 tablet (5 mg total) by mouth 2 (two) times daily. 180 tablet 3   B Complex-C (B-COMPLEX WITH VITAMIN C) tablet Take 1 tablet by mouth daily. 30 tablet 0   bumetanide (BUMEX) 1 MG tablet TAKE 1 TABLET BY MOUTH EVERY DAY AND TAKE AN ADDITIONAL 1 TABLET ON MONDAY, WEDNESDAY AND FRIDAY 90 tablet 1   carvedilol (COREG) 12.5 MG tablet Take 1 tablet (12.5 mg total) by mouth 2 (two) times daily with a meal. 180 tablet 3   hydrocerin (EUCERIN) CREA Apply 1 application topically 2 (two) times daily. 454 g 0   HYDROcodone-acetaminophen (NORCO) 10-325 MG tablet Take 1 tablet by mouth every 8 (eight) hours as needed.     Multiple Vitamin (MULTIVITAMIN) tablet Take 1 tablet by mouth daily.     spironolactone (ALDACTONE) 25 MG tablet Take 1 tablet (25 mg total) by mouth daily. 90 tablet 3   Vitamin D, Cholecalciferol, 50 MCG (2000 UT) CAPS Take 50 mcg by mouth daily.     Vitamin D, Ergocalciferol, (DRISDOL) 1.25 MG (50000 UNIT) CAPS capsule TAKE 1 CAPSULE BY MOUTH EVERY 7 DAYS. 12 capsule 1   No facility-administered medications prior to visit.    Allergies  Allergen Reactions   Cephalexin Rash   Dilaudid [Hydromorphone] Other (See Comments)    Hallucinations    ROS Review of Systems Review of Systems:  A fourteen system review of systems was performed and found to be positive as per HPI.   Objective:    Physical Exam General:  Well  Developed, well nourished, appropriate for stated age.  Neuro:  Alert and oriented,  extra-ocular muscles intact  HEENT:  Normocephalic, atraumatic, neck supple Skin:  no gross rash, warm, pink. Cardiac: Irregular rhythm, + murmur Respiratory:  CTA B/L, Not using accessory muscles, speaking in full sentences- unlabored. Vascular:  Ext warm, no cyanosis apprec.; cap RF less 2 sec. (Wearing compression socks) Psych:  No HI/SI, judgement and insight good, Euthymic mood. Full Affect.  BP 136/72   Pulse 80   Temp 98 F (36.7 C)   Ht 6' (1.829 m)   [facility]  295 lb (133.8 kg)   SpO2 97%   BMI 40.01 kg/m  Wt Readings from Last 3 Encounters:  08/21/21 295 lb (133.8 kg)  04/03/21 (!) 319 lb 6.4 oz (144.9 kg)  01/09/21 (!) 330 lb (149.7 kg)     Health Maintenance Due  Topic Date Due   Zoster Vaccines- Shingrix (1 of 2) Never done   COVID-19 Vaccine (3 - Moderna risk series) 02/12/2020    There are no preventive care reminders to display for this patient.  Lab Results  Component Value Date   TSH 1.090 10/16/2020   Lab Results  Component Value Date   WBC 7.9 10/16/2020   HGB 14.5 10/16/2020   HCT 43.6 10/16/2020   MCV 87 10/16/2020   PLT 294 10/16/2020   Lab Results  Component Value Date   NA 139 04/03/2021   K 4.3 04/03/2021   CO2 24 04/03/2021   GLUCOSE 117 (H) 04/03/2021   BUN 17 04/03/2021   CREATININE 1.06 04/03/2021   BILITOT 0.4 10/16/2020   ALKPHOS 92 10/16/2020   AST 26 10/16/2020   ALT 31 10/16/2020   PROT 7.4 10/16/2020   ALBUMIN 4.2 10/16/2020   CALCIUM 9.4 04/03/2021   ANIONGAP 7 11/15/2019   EGFR 76 04/03/2021   Lab Results  Component Value Date   CHOL 162 10/16/2020   Lab Results  Component Value Date   HDL 39 (L) 10/16/2020   Lab Results  Component Value Date   LDLCALC 89 10/16/2020   Lab Results  Component Value Date   TRIG 197 (H) 10/16/2020   Lab Results  Component Value Date   CHOLHDL 4.2 10/16/2020   Lab Results  Component Value  Date   HGBA1C 5.7 (H) 10/16/2020      Assessment & Plan:   Problem List Items Addressed This Visit       Cardiovascular and Mediastinum   Chronic atrial fibrillation ( [place]) (Chronic)    -Followed by Cardiology. -On carvedilol and apixaban.      Chronic combined systolic and diastolic heart failure ([place])    -Followed by Cardiology. -Echocardiogram 07/05/2020: LVEF 45-50%, mild aortic regurgitation and stenosis.  -On diuretic therapy. Will collect CMP to monitor renal function and electrolytes.      Hypertensive heart disease with heart failure (HCC)    -Stable. -Continue current medication regimen. -Will collect CMP for medication monitoring. -Encourage to continue weight loss efforts.      Relevant Orders   Comp Met (CMET)   CBC w/Diff   Lipid Profile     Respiratory   Sleep apnea with use of continuous positive airway pressure (CPAP) (Chronic)   OSA (obstructive sleep apnea)    -Continue BiPAP machine.        Other   Vitamin D deficiency   Relevant Orders   Vitamin D (25 hydroxy)   Prediabetes - Primary    -Last A1c 5.7 stable, will repeat A1c. -Recommend to continue weight loss efforts and reduction of glucose and carbohydrate intake.       Relevant Orders   HgB A1c   Other Visit Diagnoses     Need for influenza vaccination       Relevant Orders   Flu Vaccine QUAD High Dose(Fluad) (Completed)       No orders of the defined types were placed in this encounter.   Follow-up: Return in about 3 months (around 11/29/2021) for [place].    Lorrene Reid, PA-C

## 2021-08-21 NOTE — Assessment & Plan Note (Signed)
-  Stable. Continue current medication regimen. Will collect CMP for medication monitoring. Encourage to continue weight loss efforts. 

## 2021-08-21 NOTE — Assessment & Plan Note (Signed)
-  Continue BiPAP machine.

## 2021-08-21 NOTE — Assessment & Plan Note (Signed)
-  Last A1c 5.7 stable, will repeat A1c. -Recommend to continue weight loss efforts and reduction of glucose and carbohydrate intake.

## 2021-08-22 LAB — COMPREHENSIVE METABOLIC PANEL
ALT: 15 IU/L (ref 0–44)
AST: 34 IU/L (ref 0–40)
Albumin/Globulin Ratio: 1.3 (ref 1.2–2.2)
Albumin: 4.5 g/dL (ref 3.8–4.8)
Alkaline Phosphatase: 102 IU/L (ref 44–121)
BUN/Creatinine Ratio: 20 (ref 10–24)
BUN: 28 mg/dL — ABNORMAL HIGH (ref 8–27)
Bilirubin Total: 0.7 mg/dL (ref 0.0–1.2)
CO2: 18 mmol/L — ABNORMAL LOW (ref 20–29)
Calcium: 10 mg/dL (ref 8.6–10.2)
Chloride: 101 mmol/L (ref 96–106)
Creatinine, Ser: 1.4 mg/dL — ABNORMAL HIGH (ref 0.76–1.27)
Globulin, Total: 3.6 g/dL (ref 1.5–4.5)
Glucose: 119 mg/dL — ABNORMAL HIGH (ref 70–99)
Potassium: 4.8 mmol/L (ref 3.5–5.2)
Sodium: 144 mmol/L (ref 134–144)
Total Protein: 8.1 g/dL (ref 6.0–8.5)
eGFR: 54 mL/min/{1.73_m2} — ABNORMAL LOW (ref >59)

## 2021-09-04 ENCOUNTER — Other Ambulatory Visit: Payer: Self-pay | Admitting: Cardiology

## 2021-09-04 ENCOUNTER — Other Ambulatory Visit: Payer: Self-pay

## 2021-09-04 ENCOUNTER — Encounter: Payer: Self-pay | Admitting: Physician Assistant

## 2021-09-04 DIAGNOSIS — I110 Hypertensive heart disease with heart failure: Secondary | ICD-10-CM

## 2021-09-04 DIAGNOSIS — E559 Vitamin D deficiency, unspecified: Secondary | ICD-10-CM

## 2021-09-04 DIAGNOSIS — Z79899 Other long term (current) drug therapy: Secondary | ICD-10-CM

## 2021-09-04 DIAGNOSIS — Z98818 Other dental procedure status: Secondary | ICD-10-CM

## 2021-09-04 DIAGNOSIS — R7303 Prediabetes: Secondary | ICD-10-CM

## 2021-09-04 DIAGNOSIS — I10 Essential (primary) hypertension: Secondary | ICD-10-CM

## 2021-09-04 DIAGNOSIS — D508 Other iron deficiency anemias: Secondary | ICD-10-CM

## 2021-09-05 ENCOUNTER — Other Ambulatory Visit: Payer: Medicare Other

## 2021-09-05 ENCOUNTER — Other Ambulatory Visit: Payer: Self-pay | Admitting: Physician Assistant

## 2021-09-05 DIAGNOSIS — R7303 Prediabetes: Secondary | ICD-10-CM | POA: Diagnosis not present

## 2021-09-05 DIAGNOSIS — Z98818 Other dental procedure status: Secondary | ICD-10-CM

## 2021-09-05 DIAGNOSIS — Z79899 Other long term (current) drug therapy: Secondary | ICD-10-CM | POA: Diagnosis not present

## 2021-09-05 DIAGNOSIS — D508 Other iron deficiency anemias: Secondary | ICD-10-CM

## 2021-09-05 DIAGNOSIS — I110 Hypertensive heart disease with heart failure: Secondary | ICD-10-CM | POA: Diagnosis not present

## 2021-09-05 DIAGNOSIS — I10 Essential (primary) hypertension: Secondary | ICD-10-CM | POA: Diagnosis not present

## 2021-09-05 DIAGNOSIS — E559 Vitamin D deficiency, unspecified: Secondary | ICD-10-CM

## 2021-09-05 MED ORDER — AMOXICILLIN 500 MG PO TABS: 2000.0000 mg | ORAL_TABLET | Freq: Two times a day (BID) | ORAL | 0 refills | Status: DC

## 2021-09-06 LAB — HEMOGLOBIN A1C
Est. average glucose Bld gHb Est-mCnc: 108 mg/dL
Hgb A1c MFr Bld: 5.4 % (ref 4.8–5.6)

## 2021-09-06 LAB — CBC WITH DIFFERENTIAL/PLATELET
Basophils Absolute: 0.1 10*3/uL (ref 0.0–0.2)
Basos: 1 %
EOS (ABSOLUTE): 0.3 10*3/uL (ref 0.0–0.4)
Eos: 4 %
Hematocrit: 46 % (ref 37.5–51.0)
Hemoglobin: 15.6 g/dL (ref 13.0–17.7)
Immature Grans (Abs): 0 10*3/uL (ref 0.0–0.1)
Immature Granulocytes: 0 %
Lymphocytes Absolute: 2.2 10*3/uL (ref 0.7–3.1)
Lymphs: 27 %
MCH: 29.9 pg (ref 26.6–33.0)
MCHC: 33.9 g/dL (ref 31.5–35.7)
MCV: 88 fL (ref 79–97)
Monocytes Absolute: 0.6 10*3/uL (ref 0.1–0.9)
Monocytes: 8 %
Neutrophils Absolute: 4.8 10*3/uL (ref 1.4–7.0)
Neutrophils: 60 %
Platelets: 284 10*3/uL (ref 150–450)
RBC: 5.21 x10E6/uL (ref 4.14–5.80)
RDW: 13.3 % (ref 11.6–15.4)
WBC: 8 10*3/uL (ref 3.4–10.8)

## 2021-09-06 LAB — COMPREHENSIVE METABOLIC PANEL
ALT: 17 IU/L (ref 0–44)
AST: 17 IU/L (ref 0–40)
Albumin/Globulin Ratio: 1.3 (ref 1.2–2.2)
Albumin: 4.4 g/dL (ref 3.8–4.8)
Alkaline Phosphatase: 98 IU/L (ref 44–121)
BUN/Creatinine Ratio: 22 (ref 10–24)
BUN: 32 mg/dL — ABNORMAL HIGH (ref 8–27)
Bilirubin Total: 0.8 mg/dL (ref 0.0–1.2)
CO2: 28 mmol/L (ref 20–29)
Calcium: 10 mg/dL (ref 8.6–10.2)
Chloride: 93 mmol/L — ABNORMAL LOW (ref 96–106)
Creatinine, Ser: 1.48 mg/dL — ABNORMAL HIGH (ref 0.76–1.27)
Globulin, Total: 3.4 g/dL (ref 1.5–4.5)
Glucose: 108 mg/dL — ABNORMAL HIGH (ref 70–99)
Potassium: 3.9 mmol/L (ref 3.5–5.2)
Sodium: 138 mmol/L (ref 134–144)
Total Protein: 7.8 g/dL (ref 6.0–8.5)
eGFR: 51 mL/min/{1.73_m2} — ABNORMAL LOW (ref >59)

## 2021-09-06 LAB — LIPID PANEL
Chol/HDL Ratio: 4.5 ratio (ref 0.0–5.0)
Cholesterol, Total: 184 mg/dL (ref 100–199)
HDL: 41 mg/dL (ref >39)
LDL Chol Calc (NIH): 106 mg/dL — ABNORMAL HIGH (ref 0–99)
Triglycerides: 211 mg/dL — ABNORMAL HIGH (ref 0–149)
VLDL Cholesterol Cal: 37 mg/dL (ref 5–40)

## 2021-09-06 LAB — VITAMIN D 25 HYDROXY (VIT D DEFICIENCY, FRACTURES): Vit D, 25-Hydroxy: 51.7 ng/mL (ref 30.0–100.0)

## 2021-09-12 ENCOUNTER — Other Ambulatory Visit: Payer: Self-pay

## 2021-09-12 DIAGNOSIS — I110 Hypertensive heart disease with heart failure: Secondary | ICD-10-CM

## 2021-09-13 ENCOUNTER — Other Ambulatory Visit: Payer: Self-pay

## 2021-09-13 ENCOUNTER — Other Ambulatory Visit: Payer: Medicare Other

## 2021-09-13 DIAGNOSIS — I110 Hypertensive heart disease with heart failure: Secondary | ICD-10-CM

## 2021-09-14 LAB — COMPREHENSIVE METABOLIC PANEL
ALT: 12 IU/L (ref 0–44)
AST: 17 IU/L (ref 0–40)
Albumin/Globulin Ratio: 1.4 (ref 1.2–2.2)
Albumin: 4.2 g/dL (ref 3.8–4.8)
Alkaline Phosphatase: 92 IU/L (ref 44–121)
BUN/Creatinine Ratio: 11 (ref 10–24)
BUN: 12 mg/dL (ref 8–27)
Bilirubin Total: 0.5 mg/dL (ref 0.0–1.2)
CO2: 28 mmol/L (ref 20–29)
Calcium: 9 mg/dL (ref 8.6–10.2)
Chloride: 102 mmol/L (ref 96–106)
Creatinine, Ser: 1.11 mg/dL (ref 0.76–1.27)
Globulin, Total: 3 g/dL (ref 1.5–4.5)
Glucose: 104 mg/dL — ABNORMAL HIGH (ref 70–99)
Potassium: 4.5 mmol/L (ref 3.5–5.2)
Sodium: 141 mmol/L (ref 134–144)
Total Protein: 7.2 g/dL (ref 6.0–8.5)
eGFR: 71 mL/min/{1.73_m2} (ref >59)

## 2021-09-15 ENCOUNTER — Encounter: Payer: Self-pay | Admitting: Physician Assistant

## 2021-09-17 NOTE — Telephone Encounter (Signed)
Patient was sent a [facility] message by provider discussing results.

## 2021-09-18 ENCOUNTER — Encounter: Payer: Self-pay | Admitting: Physician Assistant

## 2021-09-26 ENCOUNTER — Other Ambulatory Visit: Payer: Self-pay | Admitting: Cardiology

## 2021-09-26 ENCOUNTER — Ambulatory Visit (INDEPENDENT_AMBULATORY_CARE_PROVIDER_SITE_OTHER): Payer: Medicare Other

## 2021-09-26 ENCOUNTER — Other Ambulatory Visit: Payer: Self-pay

## 2021-09-26 ENCOUNTER — Ambulatory Visit (INDEPENDENT_AMBULATORY_CARE_PROVIDER_SITE_OTHER): Payer: Medicare Other | Admitting: Podiatry

## 2021-09-26 DIAGNOSIS — L97512 Non-pressure chronic ulcer of other part of right foot with fat layer exposed: Secondary | ICD-10-CM | POA: Diagnosis not present

## 2021-09-26 DIAGNOSIS — M2041 Other hammer toe(s) (acquired), right foot: Secondary | ICD-10-CM

## 2021-09-26 MED ORDER — DOXYCYCLINE HYCLATE 100 MG PO TABS: 100.0000 mg | ORAL_TABLET | Freq: Two times a day (BID) | ORAL | 0 refills | Status: DC

## 2021-09-26 NOTE — Progress Notes (Signed)
HPI: 70 y.o. male presenting today with his wife for evaluation of an ulcer to the distal tip of the right third toe that has been going on for about 6 months now.  They have not sought any medical treatment over the past 6 months for the wound.  She has been applying Betadine ointment on the toe however it continues to be symptomatic and nonhealing.  They present for further treatment and evaluation  Past Medical History:  Diagnosis Date   A-fib 10/08/2016   Aortic stenosis 11/06/2017   Atherosclerotic heart disease of native coronary artery with unspecified angina pectoris ([place]) 10/08/2016   Cellulitis 11/2019   right lower extremity   CHF (congestive heart failure) ([place])    Chronic anticoagulation 10/08/2016   Chronic combined systolic and diastolic heart failure ([place]) 10/08/2016   EF range 35-45% in 2016   Chronic congestive heart failure ([place])    Chronic ulcer of toe ([place]) 08/04/2019   Coagulation disorder ([place]) 04/30/2019   Foot drop- R 10/08/2016   Patient known foot drop for many years due to lumbar spinal stenosis.-Over the past couple of months symptoms have gotten worse.  He feels he is tripping more and cannot lift his foot up as much. Toes are curling and calluses on end of toes.    - Did see spinal surgeon many yrs ago.  Declined sx at that time.      Generalized OA 10/08/2016   Ortho doc-  Dr. [facility] in [place]- both knees replaced 08, 09.      H/O noncompliance with medical treatment, presenting hazards to health 10/22/2017   Heart disease    Hypertension    Hypertensive heart disease with heart failure ([place]) 11/10/2017   Longstanding persistent atrial fibrillation (HCC)    Obesity, Class III, BMI 40-49.9 (morbid obesity) ([place]) 10/08/2016   OSA (obstructive sleep apnea) 10/08/2016   Bi-PAP nitely- been 20+ yrs now   Osteoarthritis 11/19/2017   Pulmonary hypertension ([place]) 10/08/2016   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 fascicular block) 11/07/2017   Sleep apnea  with use of continuous positive airway pressure (CPAP) 10/08/2016   Bi-PAP nitely- been 20+ yrs now   Spinal stenosis, lumbar region, with neurogenic claudication 10/08/2016   Back specialist in [place]-    only surgeon he would use- pt declined referral here for back pain mgt     Stroke [facility])       Objective: Physical Exam General: The patient is alert and oriented x3 in no acute distress.  Dermatology: Skin is cool, dry and supple bilateral lower extremities.  Ulcer noted to the distal tip of the right third toe measuring approximately 0.7 x 0.5 x 0.1 cm.  To the noted ulceration there is no eschar.  There is a minimal amount of slough fibrin and necrotic tissue noted.  There is no exposed bone.  No malodor.  Minimal drainage noted.  Vascular: Palpable pedal pulses bilaterally. No edema or erythema noted. Capillary refill within normal limits.  Neurological: Epicritic and protective threshold grossly intact bilaterally.   Musculoskeletal Exam: All pedal and ankle joints range of motion within normal limits bilateral. Muscle strength 5/5 in all groups bilateral.  Reducible hammertoe contracture deformity noted to the symptomatic toe  Assessment: 1.  Reducible hammertoe third digit right foot 2.  Ulcer distal tip of the third digit right foot   Plan of Care:  1. Patient evaluated.  2.  Different treatment options were discussed with the patient.  I explained to the patient and his spouse that he is developing an ulcer to the distal tip of the toe secondary to the hammertoe deformity.  I would recommend in office flexor tenotomy of the third toe today. 3.  After evaluating the patient I do believe that a flexor tenotomy to the respective digit would help alleviate the patient's symptoms.  This would lift the toe to alleviate pressure from the digit.  The procedure was explained in detail and all patient questions were answered.  No guarantees were expressed or implied.  The patient consented for  correction here in the office 4.  Prior to procedure the toe was blocked in a digital block fashion using 3 mL of lidocaine 2%. 5.  Flexor tenotomy was performed of the respective digit using a surgical #11 scalpel and a small percutaneous stab incision on the plantar sulcus of the toe.  The toe was immediately in a more rectus position.  Betadine soaked dry sterile dressing was applied. 6.  Post care instructions were provided 7.  Surgical shoe dispensed 8.  Prescription for doxycycline 100 mg 2 times daily #20  9.  Return to clinic in 1 week    Edrick Kins, DPM [facility]  Dr. Edrick Kins, DPM    [address], [place] [address]                Office [phone]  Fax [phone]

## 2021-09-28 HISTORY — PX: HAMMER TOE SURGERY: SHX385

## 2021-10-08 ENCOUNTER — Ambulatory Visit (INDEPENDENT_AMBULATORY_CARE_PROVIDER_SITE_OTHER): Payer: Medicare Other | Admitting: Podiatry

## 2021-10-08 ENCOUNTER — Other Ambulatory Visit: Payer: Self-pay

## 2021-10-08 DIAGNOSIS — M2041 Other hammer toe(s) (acquired), right foot: Secondary | ICD-10-CM

## 2021-10-08 DIAGNOSIS — L97512 Non-pressure chronic ulcer of other part of right foot with fat layer exposed: Secondary | ICD-10-CM

## 2021-10-08 NOTE — Progress Notes (Signed)
   Subjective:  Patient presents today status post in office flexor tenotomy of the right third toe.. DOS: 09/26/2021.  Patient states that he is doing much better.  The toe is in a more rectus alignment and the wound to the distal tip is resolving nicely.  He completed the oral antibiotics as prescribed.  No new complaints at this time  Past Medical History:  Diagnosis Date   A-fib 10/08/2016   Aortic stenosis 11/06/2017   Atherosclerotic heart disease of native coronary artery with unspecified angina pectoris ([place]) 10/08/2016   Cellulitis 11/2019   right lower extremity   CHF (congestive heart failure) ([place])    Chronic anticoagulation 10/08/2016   Chronic combined systolic and diastolic heart failure ([place]) 10/08/2016   EF range 35-45% in 2016   Chronic congestive heart failure ([place])    Chronic ulcer of toe ([place]) 08/04/2019   Coagulation disorder ([place]) 04/30/2019   Foot drop- R 10/08/2016   Patient known foot drop for many years due to lumbar spinal stenosis.-Over the past couple of months symptoms have gotten worse.  He feels he is tripping more and cannot lift his foot up as much. Toes are curling and calluses on end of toes.    - Did see spinal surgeon many yrs ago.  Declined sx at that time.      Generalized OA 10/08/2016   Ortho doc-  Dr. [facility] in [place]- both knees replaced 08, 09.      H/O noncompliance with medical treatment, presenting hazards to health 10/22/2017   Heart disease    Hypertension    Hypertensive heart disease with heart failure ([place]) 11/10/2017   Longstanding persistent atrial fibrillation (HCC)    Obesity, Class III, BMI 40-49.9 (morbid obesity) ([place]) 10/08/2016   OSA (obstructive sleep apnea) 10/08/2016   Bi-PAP nitely- been 20+ yrs now   Osteoarthritis 11/19/2017   Pulmonary hypertension ([place]) 10/08/2016   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 fascicular block) 11/07/2017   Sleep apnea with use of continuous positive airway pressure (CPAP) 10/08/2016    Bi-PAP nitely- been 20+ yrs now   Spinal stenosis, lumbar region, with neurogenic claudication 10/08/2016   Back specialist in [place]-    only surgeon he would use- pt declined referral here for back pain mgt     Stroke [facility])       Objective/Physical Exam Neurovascular status intact.  Skin incisions appear to be well coapted and healed.  No infectious process noted.  No edema.  The toe is in a more rectus alignment with alleviation of the hammertoe deformity  Assessment: 1. s/p right third digit flexor tenotomy in office. DOS: 09/26/2021   Plan of Care:  1. Patient was evaluated. 2.  Patient may discontinue the surgical shoe. 3.  Return to full activity no restrictions 4.  Return to clinic as needed   Edrick Kins, DPM [facility]  Dr. Edrick Kins, DPM    [address], [place] [address]                Office [phone]  Fax [phone]

## 2021-10-09 NOTE — Progress Notes (Signed)
Cardiology Office Note:    Date:  10/10/2021   ID:  Joel Dominguez, DOB 06-10-1951, MRN [id number]  PCP:  Lorrene Reid, PA-C  Cardiologist:  Shirlee More, MD   Referring MD: Lorrene Reid, PA-C    ASSESSMENT:    1. Hypertensive heart disease with heart failure ([place])   2. Chronic anticoagulation   3. Chronic atrial fibrillation ([place])   4. 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 fascicular block)    PLAN:    In order of problems listed above:  He is decompensated weight is up to have him resume metolazone 1 day a week recheck his renal function today continue his Bumex and MRA BP at target he monitors with a peripheral device He is consistently less than 818 systolic we will continue his beta-blocker Stable continue his current anticoagulant Stable rate controlled continue carvedilol Stable he has a peripheral device monitoring blood pressure and heart rate has had no bradycardia   Next appointment: 6 months   Medication Adjustments/Labs and Tests Ordered: Current medicines are reviewed at length with the patient today.  Concerns regarding medicines are outlined above.  Orders Placed This Encounter  Procedures   Basic metabolic panel   Pro b natriuretic peptide (BNP)   No orders of the defined types were placed in this encounter.   Chief Complaint  Patient presents with   Follow-up    History of Present Illness:    Joel Dominguez is a 70 y.o. male with a hx of longstanding permanent atrial fibrillation mild aortic valvular stenosis hypertensive heart disease with chronic diastolic heart failure bifascicular heart block and chronic anticoagulant therapy last seen 04/03/2021.  Compliance with diet, lifestyle and medications: Yes  He has been taking metolazone probably twice a week and he had a decrease in his GFR to 51 cc creatinine 1.48 has been off metolazone his weight is up about 10 to 12 pounds and we will have him resume 1 day a week. He has a  little bit of edema he is not short of breath no orthopnea chest pain palpitation or syncope. He has had no bleeding with his anticoagulant.  Echo cardiogram 07/05/2020 shows EF 45 to 50% global hypokinesia with mild aortic stenosis mean gradient 10 mmHg and mild enlargement of the ascending aorta 40 mm.  Calculated EF 51% I reviewed the images myself I would have read ejection fraction is low normal 50 to 55% unchanged from the last echocardiogram 2020.  Past Medical History:  Diagnosis Date   A-fib 10/08/2016   Aortic stenosis 11/06/2017   Atherosclerotic heart disease of native coronary artery with unspecified angina pectoris ([place]) 10/08/2016   Cellulitis 11/2019   right lower extremity   CHF (congestive heart failure) ([place])    Chronic anticoagulation 10/08/2016   Chronic combined systolic and diastolic heart failure ([place]) 10/08/2016   EF range 35-45% in 2016   Chronic congestive heart failure ([place])    Chronic ulcer of toe ([place]) 08/04/2019   Coagulation disorder ([place]) 04/30/2019   Foot drop- R 10/08/2016   Patient known foot drop for many years due to lumbar spinal stenosis.-Over the past couple of months symptoms have gotten worse.  He feels he is tripping more and cannot lift his foot up as much. Toes are curling and calluses on end of toes.    - Did see spinal surgeon many yrs ago.  Declined sx at that time.      Generalized OA 10/08/2016   Ortho doc-  Dr. Freida Busman  [place] in [place]- both knees replaced 08, 09.      H/O noncompliance with medical treatment, presenting hazards to health 10/22/2017   Heart disease    Hypertension    Hypertensive heart disease with heart failure ([place]) 11/10/2017   Longstanding persistent atrial fibrillation (HCC)    Obesity, Class III, BMI 40-49.9 (morbid obesity) ([place]) 10/08/2016   OSA (obstructive sleep apnea) 10/08/2016   Bi-PAP nitely- been 20+ yrs now   Osteoarthritis 11/19/2017   Pulmonary hypertension ([place]) 10/08/2016   RBBB (right bundle branch block with left  anterior fascicular block) 11/07/2017   Sleep apnea with use of continuous positive airway pressure (CPAP) 10/08/2016   Bi-PAP nitely- been 20+ yrs now   Spinal stenosis, lumbar region, with neurogenic claudication 10/08/2016   Back specialist in [place]-    only surgeon he would use- pt declined referral here for back pain mgt     Stroke [facility])     Past Surgical History:  Procedure Laterality Date   HAMMER TOE SURGERY Right 09/28/2021   Middle toe   I & D EXTREMITY Right 10/30/2019   Procedure: IRRIGATION AND DEBRIDEMENT EXTREMITY, right leg possible wound vac placement;  Surgeon: Marchia Bond, MD;  Location: [facility];  Service: Orthopedics;  Laterality: Right;   REPLACEMENT TOTAL KNEE Bilateral    TIBIA FRACTURE SURGERY     VITRECTOMY PARS PLANA 23 GAUGE FOR ENDOPHTHALMITIS Left 03/04/2020   Procedure: VITRECTOMY PARS PLANA 23 GAUGE FOR ENDOPHTHALMITIS;  Surgeon: Jalene Mullet, MD;  Location: [facility];  Service: Ophthalmology;  Laterality: Left;    Current Medications: Current Meds  Medication Sig   apixaban (ELIQUIS) 5 MG TABS tablet Take 1 tablet (5 mg total) by mouth 2 (two) times daily.   B Complex-C (B-COMPLEX WITH VITAMIN C) tablet Take 1 tablet by mouth daily.   bumetanide (BUMEX) 1 MG tablet TAKE 1 TABLET BY MOUTH EVERY DAY AND TAKE AN ADDITIONAL 1 TABLET ON MONDAY, WEDNESDAY AND FRIDAY   carvedilol (COREG) 12.5 MG tablet TAKE 1 TABLET (12.5 MG TOTAL) BY MOUTH 2 (TWO) TIMES DAILY WITH A MEAL.   hydrocerin (EUCERIN) CREA Apply 1 application topically 2 (two) times daily.   HYDROcodone-acetaminophen (NORCO) 10-325 MG tablet Take 1 tablet by mouth every 8 (eight) hours as needed for moderate pain.   Multiple Vitamin (MULTIVITAMIN) tablet Take 1 tablet by mouth daily.   spironolactone (ALDACTONE) 25 MG tablet TAKE 1 TABLET (25 MG TOTAL) BY MOUTH DAILY.   Vitamin D, Cholecalciferol, 50 MCG (2000 UT) CAPS Take 50 mcg by mouth daily.   Vitamin D, Ergocalciferol, (DRISDOL) 1.25 MG (50000 UNIT)  CAPS capsule TAKE 1 CAPSULE BY MOUTH EVERY 7 DAYS.     Allergies:   Cephalexin and Dilaudid [hydromorphone]   Social History   Socioeconomic History   Marital status: Married    Spouse name: Not on file   Number of children: Not on file   Years of education: Not on file   Highest education level: Not on file  Occupational History   Not on file  Tobacco Use   Smoking status: Never   Smokeless tobacco: Never  Vaping Use   Vaping Use: Never used  Substance and Sexual Activity   Alcohol use: Not Currently   Drug use: No   Sexual activity: Not on file  Other Topics Concern   Not on file  Social History Narrative   Not on file   Social Determinants of Health   Financial Resource Strain: Not on file  Food Insecurity: Not on file  Transportation Needs: Not on file  Physical Activity: Not on file  Stress: Not on file  Social Connections: Not on file     Family History: The patient's family history includes Cancer in his father; Congestive Heart Failure in his mother and sister; Diabetes in his brother and mother; Hypertension in his brother, mother, sister, and sister. ROS:   Please see the history of present illness.    All other systems reviewed and are negative.  EKGs/Labs/Other Studies Reviewed:    The following studies were reviewed today:   Recent Labs: 10/16/2020: TSH 1.090 09/05/2021: Hemoglobin 15.6; Platelets 284 09/13/2021: ALT 12; BUN 12; Creatinine, Ser 1.11; Potassium 4.5; Sodium 141  Recent Lipid Panel    Component Value Date/Time   CHOL 184 09/05/2021 0901   TRIG 211 (H) 09/05/2021 0901   HDL 41 09/05/2021 0901   CHOLHDL 4.5 09/05/2021 0901   CHOLHDL 4.3 10/08/2016 1144   VLDL 45 (H) 10/08/2016 1144   LDLCALC 106 (H) 09/05/2021 0901    Physical Exam:    VS:  BP (!) 150/80 (BP Location: Right Arm, Patient Position: Sitting, Cuff Size: Large)   Pulse 82   Ht 6' (1.829 m)   Wt (!) 304 lb (137.9 kg)   SpO2 96%   BMI 41.23 kg/m     Wt  Readings from Last 3 Encounters:  10/10/21 (!) 304 lb (137.9 kg)  08/21/21 295 lb (133.8 kg)  04/03/21 (!) 319 lb 6.4 oz (144.9 kg)     GEN:  Well nourished, well developed in no acute distress HEENT: Normal NECK: No JVD; No carotid bruits LYMPHATICS: No lymphadenopathy CARDIAC: Regular rate and rhythm no murmurs, rubs, gallops RESPIRATORY:  Clear to auscultation without rales, wheezing or rhonchi  ABDOMEN: Soft, non-tender, non-distended MUSCULOSKELETAL: Currently edema; No deformity  SKIN: Warm and dry NEUROLOGIC:  Alert and oriented x 3 PSYCHIATRIC:  Normal affect    Signed, Shirlee More, MD  10/10/2021 11:47 AM    [place]

## 2021-10-10 ENCOUNTER — Other Ambulatory Visit: Payer: Self-pay

## 2021-10-10 ENCOUNTER — Ambulatory Visit (INDEPENDENT_AMBULATORY_CARE_PROVIDER_SITE_OTHER): Payer: Medicare Other | Admitting: Cardiology

## 2021-10-10 ENCOUNTER — Encounter: Payer: Self-pay | Admitting: Cardiology

## 2021-10-10 VITALS — BP 150/80 | HR 82 | Ht 72.0 in | Wt 304.0 lb

## 2021-10-10 DIAGNOSIS — Z7901 Long term (current) use of anticoagulants: Secondary | ICD-10-CM | POA: Diagnosis not present

## 2021-10-10 DIAGNOSIS — I482 Chronic atrial fibrillation, unspecified: Secondary | ICD-10-CM

## 2021-10-10 DIAGNOSIS — I110 Hypertensive heart disease with heart failure: Secondary | ICD-10-CM

## 2021-10-10 DIAGNOSIS — I452 Bifascicular block: Secondary | ICD-10-CM

## 2021-10-10 NOTE — Patient Instructions (Signed)
Medication Instructions:  Your physician has recommended you make the following change in your medication:  [place] 1 time per week.  *If you need a refill on your cardiac medications before your next appointment, please call your pharmacy*   Lab Work: Your physician recommends that you have a BMET and pro BNP.  If you have labs (blood work) drawn today and your tests are completely normal, you will receive your results only by: [place] (if you have MyChart) OR A paper copy in the mail If you have any lab test that is abnormal or we need to change your treatment, we will call you to review the results.   Testing/Procedures: None ordered   Follow-Up: At [facility], you and your health needs are our priority.  As part of our continuing mission to provide you with exceptional heart care, we have created designated Provider Care Teams.  These Care Teams include your primary Cardiologist (physician) and Advanced Practice Providers (APPs -  Physician Assistants and Nurse Practitioners) who all work together to provide you with the care you need, when you need it.  We recommend signing up for the patient portal called "MyChart".  Sign up information is provided on this After Visit Summary.  MyChart is used to connect with patients for Virtual Visits (Telemedicine).  Patients are able to view lab/test results, encounter notes, upcoming appointments, etc.  Non-urgent messages can be sent to your provider as well.   To learn more about what you can do with MyChart, go to [email or web address].    Your next appointment:   6 month(s)  The format for your next appointment:   In Person  Provider:   Shirlee More, MD   Other Instructions NA

## 2021-10-11 ENCOUNTER — Telehealth: Payer: Self-pay

## 2021-10-11 LAB — BASIC METABOLIC PANEL
BUN/Creatinine Ratio: 12 (ref 10–24)
BUN: 13 mg/dL (ref 8–27)
CO2: 26 mmol/L (ref 20–29)
Calcium: 9.4 mg/dL (ref 8.6–10.2)
Chloride: 102 mmol/L (ref 96–106)
Creatinine, Ser: 1.09 mg/dL (ref 0.76–1.27)
Glucose: 95 mg/dL (ref 70–99)
Potassium: 4.5 mmol/L (ref 3.5–5.2)
Sodium: 146 mmol/L — ABNORMAL HIGH (ref 134–144)
eGFR: 73 mL/min/{1.73_m2} (ref >59)

## 2021-10-11 LAB — PRO B NATRIURETIC PEPTIDE: NT-Pro BNP: 508 pg/mL — ABNORMAL HIGH (ref 0–376)

## 2021-10-11 NOTE — Telephone Encounter (Signed)
Tried calling patient. No answer and no voicemail set up for me to leave a message. 

## 2021-10-11 NOTE — Telephone Encounter (Signed)
-----   Message from Richardo Priest, MD sent at 10/11/2021  7:50 AM EST ----- Good result lets have him repeat a BMP either my office or his PCP 2 weeks after restarting metolazone

## 2021-10-12 ENCOUNTER — Telehealth: Payer: Self-pay

## 2021-10-12 DIAGNOSIS — I482 Chronic atrial fibrillation, unspecified: Secondary | ICD-10-CM

## 2021-10-12 NOTE — Telephone Encounter (Signed)
-----   Message from Richardo Priest, MD sent at 10/11/2021  7:50 AM EST ----- Good result lets have him repeat a BMP either my office or his PCP 2 weeks after restarting metolazone

## 2021-10-12 NOTE — Telephone Encounter (Signed)
Spoke with patient regarding results and recommendation.  Patient verbalizes understanding and is agreeable to plan of care. Advised patient to call back with any issues or concerns.  

## 2021-10-14 ENCOUNTER — Encounter: Payer: Self-pay | Admitting: Cardiology

## 2021-10-15 ENCOUNTER — Emergency Department (HOSPITAL_BASED_OUTPATIENT_CLINIC_OR_DEPARTMENT_OTHER): Payer: Medicare Other

## 2021-10-15 ENCOUNTER — Emergency Department (HOSPITAL_BASED_OUTPATIENT_CLINIC_OR_DEPARTMENT_OTHER)
Admission: EM | Admit: 2021-10-15 | Discharge: 2021-10-15 | Disposition: A | Discharge status: Home / Self Care | Payer: Medicare Other | Attending: Emergency Medicine | Admitting: Emergency Medicine

## 2021-10-15 ENCOUNTER — Encounter (HOSPITAL_BASED_OUTPATIENT_CLINIC_OR_DEPARTMENT_OTHER): Payer: Self-pay | Admitting: *Deleted

## 2021-10-15 ENCOUNTER — Other Ambulatory Visit: Payer: Self-pay

## 2021-10-15 DIAGNOSIS — Z96653 Presence of artificial knee joint, bilateral: Secondary | ICD-10-CM | POA: Diagnosis not present

## 2021-10-15 DIAGNOSIS — I4891 Unspecified atrial fibrillation: Secondary | ICD-10-CM | POA: Insufficient documentation

## 2021-10-15 DIAGNOSIS — I25119 Atherosclerotic heart disease of native coronary artery with unspecified angina pectoris: Secondary | ICD-10-CM | POA: Insufficient documentation

## 2021-10-15 DIAGNOSIS — R109 Unspecified abdominal pain: Secondary | ICD-10-CM | POA: Diagnosis not present

## 2021-10-15 DIAGNOSIS — K4020 Bilateral inguinal hernia, without obstruction or gangrene, not specified as recurrent: Secondary | ICD-10-CM | POA: Diagnosis not present

## 2021-10-15 DIAGNOSIS — I700 Atherosclerosis of aorta: Secondary | ICD-10-CM | POA: Diagnosis not present

## 2021-10-15 DIAGNOSIS — Z7901 Long term (current) use of anticoagulants: Secondary | ICD-10-CM | POA: Insufficient documentation

## 2021-10-15 DIAGNOSIS — Z79899 Other long term (current) drug therapy: Secondary | ICD-10-CM | POA: Insufficient documentation

## 2021-10-15 DIAGNOSIS — I5042 Chronic combined systolic (congestive) and diastolic (congestive) heart failure: Secondary | ICD-10-CM | POA: Insufficient documentation

## 2021-10-15 DIAGNOSIS — M549 Dorsalgia, unspecified: Secondary | ICD-10-CM | POA: Diagnosis not present

## 2021-10-15 DIAGNOSIS — R1032 Left lower quadrant pain: Secondary | ICD-10-CM | POA: Diagnosis not present

## 2021-10-15 DIAGNOSIS — I110 Hypertensive heart disease with heart failure: Secondary | ICD-10-CM | POA: Insufficient documentation

## 2021-10-15 DIAGNOSIS — K760 Fatty (change of) liver, not elsewhere classified: Secondary | ICD-10-CM | POA: Diagnosis not present

## 2021-10-15 LAB — CBC WITH DIFFERENTIAL/PLATELET
Abs Immature Granulocytes: 0.02 10*3/uL (ref 0.00–0.07)
Basophils Absolute: 0.1 10*3/uL (ref 0.0–0.1)
Basophils Relative: 1 %
Eosinophils Absolute: 0.4 10*3/uL (ref 0.0–0.5)
Eosinophils Relative: 5 %
HCT: 42.3 % (ref 39.0–52.0)
Hemoglobin: 14.5 g/dL (ref 13.0–17.0)
Immature Granulocytes: 0 %
Lymphocytes Relative: 19 %
Lymphs Abs: 1.5 10*3/uL (ref 0.7–4.0)
MCH: 29.8 pg (ref 26.0–34.0)
MCHC: 34.3 g/dL (ref 30.0–36.0)
MCV: 87 fL (ref 80.0–100.0)
Monocytes Absolute: 0.6 10*3/uL (ref 0.1–1.0)
Monocytes Relative: 8 %
Neutro Abs: 5.3 10*3/uL (ref 1.7–7.7)
Neutrophils Relative %: 67 %
Platelets: 246 10*3/uL (ref 150–400)
RBC: 4.86 MIL/uL (ref 4.22–5.81)
RDW: 13.5 % (ref 11.5–15.5)
WBC: 7.9 10*3/uL (ref 4.0–10.5)
nRBC: 0 % (ref 0.0–0.2)

## 2021-10-15 LAB — LIPASE, BLOOD: Lipase: 48 U/L (ref 11–51)

## 2021-10-15 LAB — COMPREHENSIVE METABOLIC PANEL
ALT: 16 U/L (ref 0–44)
AST: 19 U/L (ref 15–41)
Albumin: 3.9 g/dL (ref 3.5–5.0)
Alkaline Phosphatase: 68 U/L (ref 38–126)
Anion gap: 9 (ref 5–15)
BUN: 22 mg/dL (ref 8–23)
CO2: 25 mmol/L (ref 22–32)
Calcium: 9.3 mg/dL (ref 8.9–10.3)
Chloride: 102 mmol/L (ref 98–111)
Creatinine, Ser: 0.93 mg/dL (ref 0.61–1.24)
GFR, Estimated: >60 mL/min (ref >60)
Glucose, Bld: 108 mg/dL — ABNORMAL HIGH (ref 70–99)
Potassium: 4.1 mmol/L (ref 3.5–5.1)
Sodium: 136 mmol/L (ref 135–145)
Total Bilirubin: 0.9 mg/dL (ref 0.3–1.2)
Total Protein: 7.6 g/dL (ref 6.5–8.1)

## 2021-10-15 LAB — URINALYSIS, ROUTINE W REFLEX MICROSCOPIC
Bilirubin Urine: NEGATIVE
Glucose, UA: NEGATIVE mg/dL
Hgb urine dipstick: NEGATIVE
Ketones, ur: NEGATIVE mg/dL
Leukocytes,Ua: NEGATIVE
Nitrite: NEGATIVE
Protein, ur: NEGATIVE mg/dL
Specific Gravity, Urine: 1.02 (ref 1.005–1.030)
pH: 6 (ref 5.0–8.0)

## 2021-10-15 MED ORDER — LIDOCAINE 5 % EX PTCH: 1.0000 | MEDICATED_PATCH | CUTANEOUS | 0 refills | Status: DC

## 2021-10-15 MED ORDER — LIDOCAINE 5 % EX PTCH
1.0000 | MEDICATED_PATCH | CUTANEOUS | Status: DC
  Administered 2021-10-15: 1 via TRANSDERMAL
  Filled 2021-10-15: qty 1

## 2021-10-15 NOTE — ED Notes (Signed)
Pt. Reports that last week he started having trouble urinating and feeling like he was not emptying his bladder.

## 2021-10-15 NOTE — Discharge Instructions (Signed)
You can pick up the Lidoderm patches and try applying those that they help alleviate the pain.  Otherwise take the pain medicine you have at home.  Follow-up with your primary care doctor for additional evaluation later this week especially if the pain continues.  Return to the ED if you start having chest pain or feeling short of breath.

## 2021-10-15 NOTE — ED Triage Notes (Addendum)
Right lateral abdominal pain x 4 days. It feels like a pulled muscle. Pain has progressed to his back. His MD is ruling out renal insufficiency with lab test.

## 2021-10-15 NOTE — ED Provider Notes (Signed)
[facility] EMERGENCY DEPARTMENT Provider Note   CSN: [id number] Arrival date & time: 10/15/21  1128     History Chief Complaint  Patient presents with   Back Pain    Joel Dominguez is a 70 y.o. male.  HPI  Patient with history of spinal stenosis, CHF, A. fib on chronic anticoagulations, generalized OA presents due to right flank pain.  Started acutely 4 days ago, the pain is intermittent.  It is provoked by movement, it feels sharp.  Initially was only on the right side now it is radiating from the right flank across the back to the left flank.  No dysuria or hematuria, no fevers nausea or vomiting at home.  No previous history of kidney stones, denies any injury or exertion to the back.  Has never had spinal surgery, is on oxycodone for pain.  He has tried taking a pill once which has alleviated his symptoms.   Past Medical History:  Diagnosis Date   A-fib 10/08/2016   Aortic stenosis 11/06/2017   Atherosclerotic heart disease of native coronary artery with unspecified angina pectoris ([place]) 10/08/2016   Cellulitis 11/2019   right lower extremity   CHF (congestive heart failure) ([place])    Chronic anticoagulation 10/08/2016   Chronic combined systolic and diastolic heart failure ([place]) 10/08/2016   EF range 35-45% in 2016   Chronic congestive heart failure ([place])    Chronic ulcer of toe ([place]) 08/04/2019   Coagulation disorder ([place]) 04/30/2019   Foot drop- R 10/08/2016   Patient known foot drop for many years due to lumbar spinal stenosis.-Over the past couple of months symptoms have gotten worse.  He feels he is tripping more and cannot lift his foot up as much. Toes are curling and calluses on end of toes.    - Did see spinal surgeon many yrs ago.  Declined sx at that time.      Generalized OA 10/08/2016   Ortho doc-  Dr. [facility] in [place]- both knees replaced 08, 09.      H/O noncompliance with medical treatment, presenting hazards to health 10/22/2017   Heart disease     Hypertension    Hypertensive heart disease with heart failure ([place]) 11/10/2017   Longstanding persistent atrial fibrillation (HCC)    Obesity, Class III, BMI 40-49.9 (morbid obesity) ([place]) 10/08/2016   OSA (obstructive sleep apnea) 10/08/2016   Bi-PAP nitely- been 20+ yrs now   Osteoarthritis 11/19/2017   Pulmonary hypertension ([place]) 10/08/2016   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 fascicular block) 11/07/2017   Sleep apnea with use of continuous positive airway pressure (CPAP) 10/08/2016   Bi-PAP nitely- been 20+ yrs now   Spinal stenosis, lumbar region, with neurogenic claudication 10/08/2016   Back specialist in [place]-    only surgeon he would use- pt declined referral here for back pain mgt     Stroke [facility])     Patient Active Problem List   Diagnosis Date Noted   Hypertension    Heart disease    CHF (congestive heart failure) ([place])    Long term current use of diuretic 12/01/2019   Anemia 12/01/2019   Heme positive stool 11/17/2019   Physical debility 11/11/2019   Acute blood loss anemia 11/05/2019   Cellulitis 11/2019   Chronic congestive heart failure (HCC)    Longstanding persistent atrial fibrillation (HCC)    Chronic foot ulcer ([place]) 08/08/2019   Prediabetes 08/08/2019   Left leg weakness 08/08/2019   High risk medication  use 08/08/2019   Chronic ulcer of toe ([place]) 08/04/2019   Pain due to onychomycosis of toenails of both feet 04/30/2019   Coagulation disorder ([place]) 04/30/2019   Chronic bilateral low back pain with right-sided sciatica 03/03/2018   Lumbosacral radiculopathy 01/22/2018   Osteoarthritis 11/19/2017   Vitamin D deficiency 11/19/2017   Positive for microalbuminuria 11/19/2017   Hypertensive heart disease with heart failure ([place]) 11/10/2017   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 fascicular block) 11/07/2017   Aortic stenosis 11/06/2017   H/O noncompliance with medical treatment, presenting hazards to health 10/22/2017   h/o Stroke  10/08/2016   Atherosclerotic heart disease of native coronary artery with unspecified angina pectoris ([place]) 10/08/2016   Sleep apnea with use of continuous positive airway pressure (CPAP) 10/08/2016   Chronic combined systolic and diastolic heart failure ([place]) 10/08/2016   Generalized OA 10/08/2016   Foot drop- R 10/08/2016   Lumbar spinal stenosis 10/08/2016   Pulmonary hypertension ([place]) 10/08/2016   Chronic atrial fibrillation ([place]) 10/08/2016   Chronic anticoagulation 10/08/2016   Obesity, Class III, BMI 40-49.9 (morbid obesity) ([place]) 10/08/2016   Spinal stenosis, lumbar region, with neurogenic claudication 10/08/2016   OSA (obstructive sleep apnea) 10/08/2016   A-fib 10/08/2016    Past Surgical History:  Procedure Laterality Date   HAMMER TOE SURGERY Right 09/28/2021   Middle toe   I & D EXTREMITY Right 10/30/2019   Procedure: IRRIGATION AND DEBRIDEMENT EXTREMITY, right leg possible wound vac placement;  Surgeon: Marchia Bond, MD;  Location: [facility];  Service: Orthopedics;  Laterality: Right;   REPLACEMENT TOTAL KNEE Bilateral    TIBIA FRACTURE SURGERY     VITRECTOMY PARS PLANA 23 GAUGE FOR ENDOPHTHALMITIS Left 03/04/2020   Procedure: VITRECTOMY PARS PLANA 23 GAUGE FOR ENDOPHTHALMITIS;  Surgeon: Jalene Mullet, MD;  Location: [facility];  Service: Ophthalmology;  Laterality: Left;       Family History  Problem Relation Age of Onset   Congestive Heart Failure Mother    Hypertension Mother    Diabetes Mother    Cancer Father        lung   Congestive Heart Failure Sister    Hypertension Sister    Hypertension Brother    Hypertension Sister    Diabetes Brother     Social History   Tobacco Use   Smoking status: Never   Smokeless tobacco: Never  Vaping Use   Vaping Use: Never used  Substance Use Topics   Alcohol use: Not Currently   Drug use: No    Home Medications Prior to Admission medications   Medication Sig Start Date End Date Taking? Authorizing Provider   apixaban (ELIQUIS) 5 MG TABS tablet Take 1 tablet (5 mg total) by mouth 2 (two) times daily. 12/05/20   Richardo Priest, MD  B Complex-C (B-COMPLEX WITH VITAMIN C) tablet Take 1 tablet by mouth daily. 11/17/19   Love, Ivan Anchors, PA-C  bumetanide (BUMEX) 1 MG tablet TAKE 1 TABLET BY MOUTH EVERY DAY AND TAKE AN ADDITIONAL 1 TABLET ON MONDAY, [facility] AND FRIDAY 09/04/21   Richardo Priest, MD  carvedilol (COREG) 12.5 MG tablet TAKE 1 TABLET (12.5 MG TOTAL) BY MOUTH 2 (TWO) TIMES DAILY WITH A MEAL. 09/04/21   Richardo Priest, MD  hydrocerin (EUCERIN) CREA Apply 1 application topically 2 (two) times daily. 11/17/19   Love, Ivan Anchors, PA-C  HYDROcodone-acetaminophen (NORCO) 10-325 MG tablet Take 1 tablet by mouth every 8 (eight) hours as needed for moderate pain. 02/28/20  [provider]  Multiple Vitamin (MULTIVITAMIN) tablet Take 1 tablet by mouth daily.    [provider]  spironolactone (ALDACTONE) 25 MG tablet TAKE 1 TABLET (25 MG TOTAL) BY MOUTH DAILY. 09/04/21   Richardo Priest, MD  Vitamin D, Cholecalciferol, 50 MCG (2000 UT) CAPS Take 50 mcg by mouth daily.    [provider]  Vitamin D, Ergocalciferol, (DRISDOL) 1.25 MG (50000 UNIT) CAPS capsule TAKE 1 CAPSULE BY MOUTH EVERY 7 DAYS. 01/22/21   Lorrene Reid, PA-C    Allergies    Cephalexin and Dilaudid [hydromorphone]  Review of Systems   Review of Systems  Constitutional:  Negative for chills and fever.  HENT:  Negative for ear pain and sore throat.   Eyes:  Negative for pain and visual disturbance.  Respiratory:  Negative for cough and shortness of breath.   Cardiovascular:  Negative for chest pain and palpitations.  Gastrointestinal:  Negative for abdominal pain, nausea and vomiting.  Genitourinary:  Positive for flank pain. Negative for dysuria and hematuria.  Musculoskeletal:  Positive for back pain and myalgias. Negative for arthralgias.  Skin:  Negative for color change, rash and wound.  Neurological:   Negative for seizures and syncope.  All other systems reviewed and are negative.  Physical Exam Updated Vital Signs BP (!) 161/101 (BP Location: Right Arm)   Pulse 78   Temp 97.7 F (36.5 C) (Oral)   Resp (!) 24   Ht 6' (1.829 m)   Wt (!) 137.9 kg   SpO2 99%   BMI 41.23 kg/m   Physical Exam Vitals and nursing note reviewed. Exam conducted with a chaperone present.  Constitutional:      Appearance: Normal appearance.     Comments: Well-appearing  HENT:     Head: Normocephalic and atraumatic.  Eyes:     General: No scleral icterus.       Right eye: No discharge.        Left eye: No discharge.     Extraocular Movements: Extraocular movements intact.     Pupils: Pupils are equal, round, and reactive to light.  Cardiovascular:     Rate and Rhythm: Normal rate and regular rhythm.     Pulses: Normal pulses.     Heart sounds: Normal heart sounds. No murmur heard.   No friction rub. No gallop.  Pulmonary:     Effort: Pulmonary effort is normal. No respiratory distress.     Breath sounds: Normal breath sounds.  Chest:     Chest wall: No tenderness.  Abdominal:     General: Abdomen is flat. Bowel sounds are normal. There is no distension.     Palpations: Abdomen is soft.     Tenderness: There is no abdominal tenderness. There is right CVA tenderness.     Comments: Abdomen is soft and nontender, no rigidity or guarding, there is right CVA tenderness.  Musculoskeletal:        General: Tenderness present.     Comments: No midline tenderness, no left-sided paraspinal tenderness.  There is right paraspinal tenderness/flank pain.   Skin:    General: Skin is warm and dry.     Coloration: Skin is not jaundiced.  Neurological:     Mental Status: He is alert. Mental status is at baseline.     Coordination: Coordination normal.     Comments: Cranial nerves III through XII are grossly intact, grip strength equal bilaterally   ED Results / Procedures / Treatments   Labs (all labs  ordered are listed, but only abnormal results are displayed) Labs Reviewed  URINALYSIS, ROUTINE W REFLEX MICROSCOPIC  COMPREHENSIVE METABOLIC PANEL  CBC WITH DIFFERENTIAL/PLATELET  LIPASE, BLOOD    EKG None  Radiology No results found.  Procedures Procedures   Medications Ordered in ED Medications - No data to display  ED Course  I have reviewed the triage vital signs and the nursing notes.  Pertinent labs & imaging results that were available during my care of the patient were reviewed by me and considered in my medical decision making (see chart for details).    MDM Rules/Calculators/A&P                           Stable vitals, nontoxic.  Will check abdominal labs, urine and proceed with CT renal.  Could be musculoskeletal, nephrolithiasis is a consideration.  Although there is some back pain there is no associated chest pain is not going on for more than 4 days.  Blood pressure stable, patient denies any chest pains I very low suspicion for dissection or cardiac process.  Patient did declined on pain medicine.  No leukocytosis, no anemia.  Urine negative for any signs of UTI, no blood cells consistent with nephrolithiasis.  No gross electrolyte derangement, lipase is negative.  CT renal negative for nephrolithiasis.  No findings that would attribute for his sudden pain.  On reevaluation abdomen is still soft.  Pain is reproducible by movement, I suspect this is muscle other than kidney.  Creatinine stable, no impaired renal function.  He will think additional work-up is needed at this time, patient discharged in stable condition.  Return precautions Given, patient advised to follow-up with his PCP this week.  Final Clinical Impression(s) / ED Diagnoses Final diagnoses:  None    Rx / DC Orders ED Discharge Orders     None        Sherrill Raring, Hershal Coria 10/15/21 1535    Fredia Sorrow, MD 10/18/21 [phone]

## 2021-10-16 DIAGNOSIS — Z79899 Other long term (current) drug therapy: Secondary | ICD-10-CM | POA: Diagnosis not present

## 2021-10-16 DIAGNOSIS — Z5181 Encounter for therapeutic drug level monitoring: Secondary | ICD-10-CM | POA: Diagnosis not present

## 2021-10-20 ENCOUNTER — Other Ambulatory Visit: Payer: Self-pay | Admitting: Cardiology

## 2021-10-31 ENCOUNTER — Other Ambulatory Visit: Payer: Self-pay | Admitting: Physician Assistant

## 2021-10-31 DIAGNOSIS — E559 Vitamin D deficiency, unspecified: Secondary | ICD-10-CM

## 2021-11-04 DIAGNOSIS — I4811 Longstanding persistent atrial fibrillation: Secondary | ICD-10-CM | POA: Insufficient documentation

## 2021-11-14 ENCOUNTER — Encounter: Payer: Self-pay | Admitting: Cardiology

## 2021-11-14 ENCOUNTER — Other Ambulatory Visit: Payer: Self-pay

## 2021-11-14 MED ORDER — APIXABAN 5 MG PO TABS: 5.0000 mg | ORAL_TABLET | Freq: Two times a day (BID) | ORAL | 1 refills | Status: DC

## 2021-11-14 NOTE — Telephone Encounter (Signed)
Prescription refill request for Eliquis received. Indication:Afib Last office visit:12/22 Scr:0.9 Age: 71 Weight:137.9 kg  Prescription refilled

## 2021-11-15 ENCOUNTER — Other Ambulatory Visit: Payer: Self-pay

## 2021-11-21 ENCOUNTER — Ambulatory Visit (INDEPENDENT_AMBULATORY_CARE_PROVIDER_SITE_OTHER): Payer: Medicare Other | Admitting: Physician Assistant

## 2021-11-21 ENCOUNTER — Other Ambulatory Visit: Payer: Self-pay

## 2021-11-21 ENCOUNTER — Encounter: Payer: Self-pay | Admitting: Physician Assistant

## 2021-11-21 VITALS — BP 136/78 | HR 72 | Ht 72.0 in | Wt 300.0 lb

## 2021-11-21 DIAGNOSIS — Z0000 Encounter for general adult medical examination without abnormal findings: Secondary | ICD-10-CM | POA: Diagnosis not present

## 2021-11-21 NOTE — Progress Notes (Signed)
Virtual Visit via Telephone Note:  I connected with Joel Dominguez by telephone and verified that I am speaking with the correct person using two identifiers.    I discussed the limitations, risks, security and privacy concerns for performing an evaluation and management service by telephone and the availability of in person appointments. The staff discussed with patient that there may be a patient responsible charge related to this service. The patient expressed understanding and agreed to proceed.   Location of Patient- Home Location of Provider- Office   Subjective:   Joel Dominguez is a 71 y.o. male who presents for Medicare Annual/Subsequent preventive examination.  Review of Systems    General:   No F/C, wt loss Pulm:   No DIB, SOB, pleuritic chest pain Card:  No CP, palpitations Abd:  No n/v/d or pain Ext:  No inc edema from baseline    Objective:    Today's Vitals   11/21/21 1024  BP: 136/78  Pulse: 72  Weight: 300 lb (136.1 kg)  Height: 6' (1.829 m)   Body mass index is 40.69 kg/m.  Advanced Directives 10/15/2021 11/11/2019 11/09/2019 04/15/2019 10/08/2016  Does Patient Have a Medical Advance Directive? Yes Yes Yes No Yes  Type of Advance Directive Living will;Healthcare Power of [place];Living will [place];Living will - [place];Living will  Does patient want to make changes to medical advance directive? - No - Patient declined No - Patient declined - -  Copy of [place] in Chart? - No - copy requested No - copy requested - -  Would patient like information on creating a medical advance directive? No - Patient declined - - - -    Current Medications (verified) Outpatient Encounter Medications as of 11/21/2021  Medication Sig   apixaban (ELIQUIS) 5 MG TABS tablet Take 1 tablet (5 mg total) by mouth 2 (two) times daily.   B Complex-C (B-COMPLEX WITH VITAMIN C) tablet Take 1 tablet  by mouth daily.   bumetanide (BUMEX) 1 MG tablet TAKE 1 TABLET BY MOUTH EVERY DAY AND TAKE AN ADDITIONAL 1 TABLET ON MONDAY, WEDNESDAY AND FRIDAY   carvedilol (COREG) 12.5 MG tablet TAKE 1 TABLET (12.5 MG TOTAL) BY MOUTH 2 (TWO) TIMES DAILY WITH A MEAL.   hydrocerin (EUCERIN) CREA Apply 1 application topically 2 (two) times daily.   HYDROcodone-acetaminophen (NORCO) 10-325 MG tablet Take 1 tablet by mouth every 8 (eight) hours as needed for moderate pain.   Multiple Vitamin (MULTIVITAMIN) tablet Take 1 tablet by mouth daily.   spironolactone (ALDACTONE) 25 MG tablet TAKE 1 TABLET (25 MG TOTAL) BY MOUTH DAILY.   Vitamin D, Ergocalciferol, (DRISDOL) 1.25 MG (50000 UNIT) CAPS capsule TAKE 1 CAPSULE BY MOUTH EVERY 7 DAYS.   [DISCONTINUED] Vitamin D, Cholecalciferol, 50 MCG (2000 UT) CAPS Take 50 mcg by mouth daily.   [DISCONTINUED] lidocaine (LIDODERM) 5 % Place 1 patch onto the skin daily. Remove & Discard patch within 12 hours or as directed by MD   No facility-administered encounter medications on file as of 11/21/2021.    Allergies (verified) Cephalexin and Dilaudid [hydromorphone]   History: Past Medical History:  Diagnosis Date   A-fib 10/08/2016   Aortic stenosis 11/06/2017   Atherosclerotic heart disease of native coronary artery with unspecified angina pectoris ([place]) 10/08/2016   Cellulitis 11/2019   right lower extremity   CHF (congestive heart failure) (HCC)    Chronic anticoagulation 10/08/2016   Chronic combined systolic and diastolic  heart failure ([place]) 10/08/2016   EF range 35-45% in 2016   Chronic congestive heart failure (HCC)    Chronic ulcer of toe ([place]) 08/04/2019   Coagulation disorder ([place]) 04/30/2019   Foot drop- R 10/08/2016   Patient known foot drop for many years due to lumbar spinal stenosis.-Over the past couple of months symptoms have gotten worse.  He feels he is tripping more and cannot lift his foot up as much. Toes are curling and calluses on end of toes.    - Did  see spinal surgeon many yrs ago.  Declined sx at that time.      Generalized OA 10/08/2016   Ortho doc-  Dr. [facility] in [place]- both knees replaced 08, 09.      H/O noncompliance with medical treatment, presenting hazards to health 10/22/2017   Heart disease    Hypertension    Hypertensive heart disease with heart failure ([place]) 11/10/2017   Longstanding persistent atrial fibrillation (HCC)    Obesity, Class III, BMI 40-49.9 (morbid obesity) ([place]) 10/08/2016   OSA (obstructive sleep apnea) 10/08/2016   Bi-PAP nitely- been 20+ yrs now   Osteoarthritis 11/19/2017   Pulmonary hypertension ([place]) 10/08/2016   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 fascicular block) 11/07/2017   Sleep apnea with use of continuous positive airway pressure (CPAP) 10/08/2016   Bi-PAP nitely- been 20+ yrs now   Spinal stenosis, lumbar region, with neurogenic claudication 10/08/2016   Back specialist in [place]-    only surgeon he would use- pt declined referral here for back pain mgt     Stroke [facility])    Past Surgical History:  Procedure Laterality Date   HAMMER TOE SURGERY Right 09/28/2021   Middle toe   I & D EXTREMITY Right 10/30/2019   Procedure: IRRIGATION AND DEBRIDEMENT EXTREMITY, right leg possible wound vac placement;  Surgeon: Marchia Bond, MD;  Location: [facility];  Service: Orthopedics;  Laterality: Right;   REPLACEMENT TOTAL KNEE Bilateral    TIBIA FRACTURE SURGERY     VITRECTOMY PARS PLANA 23 GAUGE FOR ENDOPHTHALMITIS Left 03/04/2020   Procedure: VITRECTOMY PARS PLANA 23 GAUGE FOR ENDOPHTHALMITIS;  Surgeon: Jalene Mullet, MD;  Location: [facility];  Service: Ophthalmology;  Laterality: Left;   Family History  Problem Relation Age of Onset   Congestive Heart Failure Mother    Hypertension Mother    Diabetes Mother    Cancer Father        lung   Congestive Heart Failure Sister    Hypertension Sister    Hypertension Brother    Hypertension Sister    Diabetes Brother    Social History    Socioeconomic History   Marital status: Married    Spouse name: Not on file   Number of children: Not on file   Years of education: Not on file   Highest education level: Not on file  Occupational History   Not on file  Tobacco Use   Smoking status: Never   Smokeless tobacco: Never  Vaping Use   Vaping Use: Never used  Substance and Sexual Activity   Alcohol use: Not Currently   Drug use: No   Sexual activity: Not on file  Other Topics Concern   Not on file  Social History Narrative   Not on file   Social Determinants of Health   Financial Resource Strain: Not on file  Food Insecurity: Not on file  Transportation Needs: Not on file  Physical Activity: Not on file  Stress: Not on file  Social Connections: Not on file    Tobacco Counseling Counseling given: Not Answered   Diabetic?no         Activities of Daily Living In your present state of health, do you have any difficulty performing the following activities: 08/21/2021 01/09/2021  Hearing? Y Y  Vision? - N  Difficulty concentrating or making decisions? N N  Walking or climbing stairs? Y Y  Dressing or bathing? N N  Doing errands, shopping? N N  Some recent data might be hidden    Patient Care Team: Lorrene Reid, PA-C as PCP - General Bettina Gavia Hilton Cork, MD as PCP - Cardiology (Cardiology) Lucia Bitter., MD as Physician Assistant (Pain Medicine) Richardo Priest, MD as Consulting Physician (Cardiology) Edrick Kins, DPM as Consulting Physician (Podiatry)  Indicate any recent Medical Services you may have received from other than [facility] providers in the past year (date may be approximate).     Assessment:   This is a routine wellness examination for [place].  Hearing/Vision screen No results found.  Dietary issues and exercise activities discussed: -Heart healthy diet low in fat and carbohydrates. Stay as active as possible with house/yard work.   Goals Addressed   None   Depression  Screen PHQ 2/9 Scores 08/21/2021 01/09/2021 10/19/2020 06/29/2020 02/28/2020 12/01/2019 10/21/2019  PHQ - 2 Score 0 0 0 0 0 0 0  PHQ- 9 Score 3 0 2 2 0 2 1    Fall Risk Fall Risk  08/21/2021 01/09/2021 10/19/2020 06/29/2020 03/09/2019  Falls in the past year? 0 0 0 1 0  Number falls in past yr: 0 - - 0 -  Injury with Fall? 0 - - 0 -  Risk for fall due to : No Fall Risks - - - Impaired mobility;Impaired balance/gait  Follow up Falls evaluation completed Falls evaluation completed Falls evaluation completed Falls evaluation completed -    FALL RISK PREVENTION PERTAINING TO THE HOME:  Any stairs in or around the home? Yes  If so, are there any without handrails? Yes  two stairs to garage  Home free of loose throw rugs in walkways, pet beds, electrical cords, etc? Yes  Adequate lighting in your home to reduce risk of falls? Yes   ASSISTIVE DEVICES UTILIZED TO PREVENT FALLS:  Life alert? No  Use of a cane, walker or w/c? Yes  Grab bars in the bathroom? Yes  Shower chair or bench in shower? Yes  Elevated toilet seat or a handicapped toilet? Yes   TIMED UP AND GO:  Was the test performed? No . Telehealth  Length of time to ambulate 10 feet: 0 sec.     Cognitive Function:     6CIT Screen 11/21/2021 10/19/2020 03/09/2019  What Year? 0 points 0 points 0 points  What month? 0 points 0 points 0 points  What time? 0 points 0 points 0 points  Count back from 20 0 points 0 points 0 points  Months in reverse 0 points 0 points 0 points  Repeat phrase 0 points 0 points 4 points  Total Score 0 0 4    Immunizations Immunization History  Administered Date(s) Administered   Fluad Quad(high Dose 65+) 08/05/2019, 09/07/2020, 08/21/2021   Influenza, High Dose Seasonal PF 10/08/2016, 09/18/2018   Influenza-Unspecified 09/18/2018   Moderna Sars-Covid-2 Vaccination 12/18/2019, 01/15/2020   Pneumococcal Conjugate-13 05/13/2014   Pneumococcal Polysaccharide-23 03/11/2007, 08/04/2010    TDAP status:  Due, Education has been provided regarding the importance of  this vaccine. Advised may receive this vaccine at local pharmacy or Health Dept. Aware to provide a copy of the vaccination record if obtained from local pharmacy or Health Dept. Verbalized acceptance and understanding.  Flu Vaccine status: Up to date  Pneumococcal vaccine status: Due, Education has been provided regarding the importance of this vaccine. Advised may receive this vaccine at local pharmacy or Health Dept. Aware to provide a copy of the vaccination record if obtained from local pharmacy or Health Dept. Verbalized acceptance and understanding.  Covid-19 vaccine status: Completed vaccines  Qualifies for Shingles Vaccine? Yes   Zostavax completed No   Shingrix Completed?: No.    Education has been provided regarding the importance of this vaccine. Patient has been advised to call insurance company to determine out of pocket expense if they have not yet received this vaccine. Advised may also receive vaccine at local pharmacy or Health Dept. Verbalized acceptance and understanding.  Screening Tests Health Maintenance  Topic Date Due   Hepatitis C Screening  Never done   TETANUS/TDAP  Never done   Zoster Vaccines- Shingrix (1 of 2) Never done   Pneumonia Vaccine 67+ Years old (4 - PPSV23 if available, else PCV20) 05/06/2016   COVID-19 Vaccine (3 - Moderna risk series) 02/12/2020   COLONOSCOPY (Pts 45-75yrs Insurance coverage will need to be confirmed)  10/23/2027   INFLUENZA VACCINE  Completed   HPV VACCINES  Aged Out    Health Maintenance  Health Maintenance Due  Topic Date Due   Hepatitis C Screening  Never done   TETANUS/TDAP  Never done   Zoster Vaccines- Shingrix (1 of 2) Never done   Pneumonia Vaccine 78+ Years old (44 - PPSV23 if available, else PCV20) 05/06/2016   COVID-19 Vaccine (3 - Moderna risk series) 02/12/2020    Colorectal cancer screening: Type of screening: Colonoscopy. Completed 08/20/2013.  Repeat every 10 years  Lung Cancer Screening: (Low Dose CT Chest recommended if Age 16-80 years, 30 pack-year currently smoking OR have quit w/in 15years.) does not qualify.   Lung Cancer Screening Referral: n/a  Additional Screening:  Hepatitis C Screening: does qualify; Declined screening.   Vision Screening: Recommended annual ophthalmology exams for early detection of glaucoma and other disorders of the eye. Is the patient up to date with their annual eye exam?  Yes  Who is the provider or what is the name of the office in which the patient attends annual eye exams? Dr. Posey Pronto  If pt is not established with a provider, would they like to be referred to a provider to establish care? No .   Dental Screening: Recommended annual dental exams for proper oral hygiene  Community Resource Referral / Chronic Care Management: CRR required this visit?  No   CCM required this visit?  No      Plan:  -Continue to follow up with various specialists. -Continue current medication regimen. -Recommend repeating fasting labs at follow up visit. -Follow up in 4 months for HTN, prediabetes and OSA  I have personally reviewed and noted the following in the patients chart:   Medical and social history Use of alcohol, tobacco or illicit drugs  Current medications and supplements including opioid prescriptions. Patient is currently taking opioid prescriptions. Information provided to patient regarding non-opioid alternatives. Patient advised to discuss non-opioid treatment plan with their provider. Functional ability and status Nutritional status Physical activity Advanced directives List of other physicians Hospitalizations, surgeries, and ER visits in previous 12 months Vitals Screenings to include cognitive,  depression, and falls Referrals and appointments  In addition, I have reviewed and discussed with patient certain preventive protocols, quality metrics, and best practice  recommendations. A written personalized care plan for preventive services as well as general preventive health recommendations were provided to patient.     Lorrene Reid, PA-C   11/21/2021

## 2021-11-21 NOTE — Patient Instructions (Signed)
Preventive Care 65 Years and Older, Male °Preventive care refers to lifestyle choices and visits with your health care provider that can promote health and wellness. Preventive care visits are also called wellness exams. °What can I expect for my preventive care visit? °Counseling °During your preventive care visit, your health care provider may ask about your: °Medical history, including: °Past medical problems. °Family medical history. °History of falls. °Current health, including: °Emotional well-being. °Home life and relationship well-being. °Sexual activity. °Memory and ability to understand (cognition). °Lifestyle, including: °Alcohol, nicotine or tobacco, and drug use. °Access to firearms. °Diet, exercise, and sleep habits. °Work and work environment. °Sunscreen use. °Safety issues such as seatbelt and bike helmet use. °Physical exam °Your health care provider will check your: °Height and weight. These may be used to calculate your BMI (body mass index). BMI is a measurement that tells if you are at a healthy weight. °Waist circumference. This measures the distance around your waistline. This measurement also tells if you are at a healthy weight and may help predict your risk of certain diseases, such as type 2 diabetes and high blood pressure. °Heart rate and blood pressure. °Body temperature. °Skin for abnormal spots. °What immunizations do I need? °Vaccines are usually given at various ages, according to a schedule. Your health care provider will recommend vaccines for you based on your age, medical history, and lifestyle or other factors, such as travel or where you work. °What tests do I need? °Screening °Your health care provider may recommend screening tests for certain conditions. This may include: °Lipid and cholesterol levels. °Diabetes screening. This is done by checking your blood sugar (glucose) after you have not eaten for a while (fasting). °Hepatitis C test. °Hepatitis B test. °HIV (human  immunodeficiency virus) test. °STI (sexually transmitted infection) testing, if you are at risk. °Lung cancer screening. °Colorectal cancer screening. °Prostate cancer screening. °Abdominal aortic aneurysm (AAA) screening. You may need this if you are a current or former smoker. °Talk with your health care provider about your test results, treatment options, and if necessary, the need for more tests. °Follow these instructions at home: °Eating and drinking ° °Eat a diet that includes fresh fruits and vegetables, whole grains, lean protein, and low-fat dairy products. Limit your intake of foods with high amounts of sugar, saturated fats, and salt. °Take vitamin and mineral supplements as recommended by your health care provider. °Do not drink alcohol if your health care provider tells you not to drink. °If you drink alcohol: °Limit how much you have to 0-2 drinks a day. °Know how much alcohol is in your drink. In the [place] one drink equals one 12 oz bottle of beer (355 mL), one 5 oz glass of wine (148 mL), or one 1½ oz glass of hard liquor (44 mL). °Lifestyle °Brush your teeth every morning and night with fluoride toothpaste. Floss one time each day. °Exercise for at least 30 minutes 5 or more days each week. °Do not use any products that contain nicotine or tobacco. These products include cigarettes, chewing tobacco, and vaping devices, such as e-cigarettes. If you need help quitting, ask your health care provider. °Do not use drugs. °If you are sexually active, practice safe sex. Use a condom or other form of protection to prevent STIs. °Take aspirin only as told by your health care provider. Make sure that you understand how much to take and what form to take. Work with your health care provider to find out whether it is safe and   beneficial for you to take aspirin daily. °Ask your health care provider if you need to take a cholesterol-lowering medicine (statin). °Find healthy ways to manage stress, such  as: °Meditation, yoga, or listening to music. °Journaling. °Talking to a trusted person. °Spending time with friends and family. °Safety °Always wear your seat belt while driving or riding in a vehicle. °Do not drive: °If you have been drinking alcohol. Do not ride with someone who has been drinking. °When you are tired or distracted. °While texting. °If you have been using any mind-altering substances or drugs. °Wear a helmet and other protective equipment during sports activities. °If you have firearms in your house, make sure you follow all gun safety procedures. °Minimize exposure to UV radiation to reduce your risk of skin cancer. °What's next? °Visit your health care provider once a year for an annual wellness visit. °Ask your health care provider how often you should have your eyes and teeth checked. °Stay up to date on all vaccines. °This information is not intended to replace advice given to you by your health care provider. Make sure you discuss any questions you have with your health care provider. °Document Revised: 04/18/2021 Document Reviewed: 04/18/2021 °Elsevier Patient Education © 2022 Elsevier Inc. ° °

## 2021-12-06 ENCOUNTER — Encounter: Payer: Self-pay | Admitting: Cardiology

## 2021-12-07 ENCOUNTER — Telehealth: Payer: Self-pay

## 2021-12-07 NOTE — Telephone Encounter (Signed)
Spoke with patient about dentist clearance form. Per Maritza's guidance, the patient will have his BP rechecked Monday morning before the form is signed by the provider. The patient has already reached out to his cardiologist about the recommendations for his eliquis. He gave verbal understanding for BP recheck.

## 2021-12-10 ENCOUNTER — Other Ambulatory Visit: Payer: Self-pay

## 2021-12-10 ENCOUNTER — Other Ambulatory Visit: Payer: Medicare Other

## 2022-02-12 ENCOUNTER — Encounter: Payer: Self-pay | Admitting: Cardiology

## 2022-02-12 NOTE — Telephone Encounter (Signed)
Called pt regarding message to obtain Echo from hospital in [address]. Advised to come by [facility] or [facility] office to signa a records release. Advised him to bring name, ph number, fax number and address of the facility. Pt verbalized understanding and agreed.  ?

## 2022-02-18 ENCOUNTER — Encounter: Payer: Self-pay | Admitting: Physician Assistant

## 2022-02-18 ENCOUNTER — Ambulatory Visit (INDEPENDENT_AMBULATORY_CARE_PROVIDER_SITE_OTHER): Payer: Medicare Other | Admitting: Physician Assistant

## 2022-02-18 VITALS — BP 138/76 | HR 86 | Temp 98.0°F | Ht 72.0 in | Wt 310.0 lb

## 2022-02-18 DIAGNOSIS — A419 Sepsis, unspecified organism: Secondary | ICD-10-CM | POA: Diagnosis not present

## 2022-02-18 DIAGNOSIS — I5032 Chronic diastolic (congestive) heart failure: Secondary | ICD-10-CM | POA: Diagnosis not present

## 2022-02-18 DIAGNOSIS — I10 Essential (primary) hypertension: Secondary | ICD-10-CM

## 2022-02-18 DIAGNOSIS — Z09 Encounter for follow-up examination after completed treatment for conditions other than malignant neoplasm: Secondary | ICD-10-CM

## 2022-02-18 DIAGNOSIS — L03115 Cellulitis of right lower limb: Secondary | ICD-10-CM | POA: Diagnosis not present

## 2022-02-18 NOTE — Patient Instructions (Signed)
Cellulitis, Adult  Cellulitis is a skin infection. The infected area is often warm, red, swollen, and sore. It occurs most often in the arms and lower legs. It is very important to get treated for this condition. What are the causes? This condition is caused by bacteria. The bacteria enter through a break in the skin, such as a cut, burn, insect bite, open sore, or crack. What increases the risk? This condition is more likely to occur in people who: Have a weak body defense system (immune system). Have open cuts, burns, bites, or scrapes on the skin. Are older than 71 years of age. Have a blood sugar problem (diabetes). Have a long-lasting (chronic) liver disease (cirrhosis) or kidney disease. Are very overweight (obese). Have a skin problem, such as: Itchy rash (eczema). Slow movement of blood in the veins (venous stasis). Fluid buildup below the skin (edema). Have been treated with high-energy rays (radiation). Use IV drugs. What are the signs or symptoms? Symptoms of this condition include: Skin that is: Red. Streaking. Spotting. Swollen. Sore or painful when you touch it. Warm. A fever. Chills. Blisters. How is this diagnosed? This condition is diagnosed based on: Medical history. Physical exam. Blood tests. Imaging tests. How is this treated? Treatment for this condition may include: Medicines to treat infections or allergies. Home care, such as: Rest. Placing cold or warm cloths (compresses) on the skin. Hospital care, if the condition is very bad. Follow these instructions at home: Medicines Take over-the-counter and prescription medicines only as told by your doctor. If you were prescribed an antibiotic medicine, take it as told by your doctor. Do not stop taking it even if you start to feel better. General instructions  Drink enough fluid to keep your pee (urine) pale yellow. Do not touch or rub the infected area. Raise (elevate) the infected area above  the level of your heart while you are sitting or lying down. Place cold or warm cloths on the area as told by your doctor. Keep all follow-up visits as told by your doctor. This is important. Contact a doctor if: You have a fever. You do not start to get better after 1-2 days of treatment. Your bone or joint under the infected area starts to hurt after the skin has healed. Your infection comes back. This can happen in the same area or another area. You have a swollen bump in the area. You have new symptoms. You feel ill and have muscle aches and pains. Get help right away if: Your symptoms get worse. You feel very sleepy. You throw up (vomit) or have watery poop (diarrhea) for a long time. You see red streaks coming from the area. Your red area gets larger. Your red area turns dark in color. These symptoms may represent a serious problem that is an emergency. Do not wait to see if the symptoms will go away. Get medical help right away. Call your local emergency services (911 in the [place] Do not drive yourself to the hospital. Summary Cellulitis is a skin infection. The area is often warm, red, swollen, and sore. This condition is treated with medicines, rest, and cold and warm cloths. Take all medicines only as told by your doctor. Tell your doctor if symptoms do not start to get better after 1-2 days of treatment. This information is not intended to replace advice given to you by your health care provider. Make sure you discuss any questions you have with your health care provider. Document Revised: 08/02/2021 Document   Reviewed: 08/02/2021 Elsevier Patient Education  2023 Elsevier Inc.  

## 2022-02-18 NOTE — Progress Notes (Signed)
? ?Established Patient Office Visit ? ?Subjective:  ?Patient ID: Joel Dominguez, male    DOB: 1951/04/10  Age: 71 y.o. MRN: [id number] ? ?CC:  ?Chief Complaint  ?Patient presents with  ? Hospitalization Follow-up  ? ? ?HPI ?Joel Dominguez presents for hospital follow-up. Patient admitted at [facility] 02/05/22 and discharged 02/07/22 for sepsis secondary to right leg cellulitis. Patient was placed on antibiotic therpay with rocephin and doxycycline and then transitioned to IV vancoymycin and cefepime. WBC improved after 48 hrs of being on antibiotics and transitioned to Keflex 500 mg QID x 14 days. BNP was elevated at 3510, CXR showed cardiomegaly. Pt was continued on heart failure medications and advised to follow-up with cardiologist for more goal directed medical therapy such as Entresto vs [place]. Patient was stable for discharge. Patient reports feeling some better, gradually regaining his strength back. Still has 4 days left of antibiotic therapy (Keflex). Patient's wife states she has been checking his right leg and it has not been red, swollen or warm. No fever, chills, confusion, or night sweats.  ? ?Past Medical History:  ?Diagnosis Date  ? A-fib 10/08/2016  ? Aortic stenosis 11/06/2017  ? Atherosclerotic heart disease of native coronary artery with unspecified angina pectoris ([place]) 10/08/2016  ? Cellulitis 11/2019  ? right lower extremity  ? CHF (congestive heart failure) ([place])   ? Chronic anticoagulation 10/08/2016  ? Chronic combined systolic and diastolic heart failure ([place]) 10/08/2016  ? EF range 35-45% in 2016  ? Chronic congestive heart failure ([place])   ? Chronic ulcer of toe ([place]) 08/04/2019  ? Coagulation disorder ([place]) 04/30/2019  ? Foot drop- R 10/08/2016  ? Patient known foot drop for many years due to lumbar spinal stenosis.-Over the past couple of months symptoms have gotten worse.  He feels he is tripping more and cannot lift his foot up as much. Toes are curling and calluses  on end of toes.    - Did see spinal surgeon many yrs ago.  Declined sx at that time.     ? Generalized OA 10/08/2016  ? Ortho doc-  Dr. [facility] in [place]- both knees replaced 08, 09.     ? H/O noncompliance with medical treatment, presenting hazards to health 10/22/2017  ? Heart disease   ? Hypertension   ? Hypertensive heart disease with heart failure ([place]) 11/10/2017  ? Longstanding persistent atrial fibrillation ([place])   ? Obesity, Class III, BMI 40-49.9 (morbid obesity) ([place]) 10/08/2016  ? OSA (obstructive sleep apnea) 10/08/2016  ? Bi-PAP nitely- been 20+ yrs now  ? Osteoarthritis 11/19/2017  ? Pulmonary hypertension ([place]) 10/08/2016  ? 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 fascicular block) 11/07/2017  ? Sleep apnea with use of continuous positive airway pressure (CPAP) 10/08/2016  ? Bi-PAP nitely- been 20+ yrs now  ? Spinal stenosis, lumbar region, with neurogenic claudication 10/08/2016  ? Back specialist in [place]-    only surgeon he would use- pt declined referral here for back pain mgt    ? Stroke [facility])   ? ? ?Past Surgical History:  ?Procedure Laterality Date  ? HAMMER TOE SURGERY Right 09/28/2021  ? Middle toe  ? I & D EXTREMITY Right 10/30/2019  ? Procedure: IRRIGATION AND DEBRIDEMENT EXTREMITY, right leg possible wound vac placement;  Surgeon: Marchia Bond, MD;  Location: [facility];  Service: Orthopedics;  Laterality: Right;  ? REPLACEMENT TOTAL KNEE Bilateral   ? TIBIA FRACTURE SURGERY    ? VITRECTOMY PARS PLANA  [place] Left 03/04/2020  ? Procedure: VITRECTOMY PARS PLANA 23 GAUGE FOR ENDOPHTHALMITIS;  Surgeon: Jalene Mullet, MD;  Location: [facility];  Service: Ophthalmology;  Laterality: Left;  ? ? ?Family History  ?Problem Relation Age of Onset  ? Congestive Heart Failure Mother   ? Hypertension Mother   ? Diabetes Mother   ? Cancer Father   ?     lung  ? Congestive Heart Failure Sister   ? Hypertension Sister   ? Hypertension Brother   ? Hypertension Sister   ? Diabetes Brother    ? ? ?Social History  ? ?Socioeconomic History  ? Marital status: Married  ?  Spouse name: Not on file  ? Number of children: Not on file  ? Years of education: Not on file  ? Highest education level: Not on file  ?Occupational History  ? Not on file  ?Tobacco Use  ? Smoking status: Never  ? Smokeless tobacco: Never  ?Vaping Use  ? Vaping Use: Never used  ?Substance and Sexual Activity  ? Alcohol use: Not Currently  ? Drug use: No  ? Sexual activity: Not on file  ?Other Topics Concern  ? Not on file  ?Social History Narrative  ? Not on file  ? ?Social Determinants of Health  ? ?Financial Resource Strain: Not on file  ?Food Insecurity: Not on file  ?Transportation Needs: Not on file  ?Physical Activity: Not on file  ?Stress: Not on file  ?Social Connections: Not on file  ?Intimate Partner Violence: Not on file  ? ? ?Outpatient Medications Prior to Visit  ?Medication Sig Dispense Refill  ? apixaban (ELIQUIS) 5 MG TABS tablet Take 1 tablet (5 mg total) by mouth 2 (two) times daily. 180 tablet 1  ? B Complex-C (B-COMPLEX WITH VITAMIN C) tablet Take 1 tablet by mouth daily. 30 tablet 0  ? bumetanide (BUMEX) 1 MG tablet TAKE 1 TABLET BY MOUTH EVERY DAY AND TAKE AN ADDITIONAL 1 TABLET ON MONDAY, WEDNESDAY AND FRIDAY 102 tablet 1  ? carvedilol (COREG) 12.5 MG tablet TAKE 1 TABLET (12.5 MG TOTAL) BY MOUTH 2 (TWO) TIMES DAILY WITH A MEAL. 180 tablet 1  ? hydrocerin (EUCERIN) CREA Apply 1 application topically 2 (two) times daily. 454 g 0  ? HYDROcodone-acetaminophen (NORCO) 10-325 MG tablet Take 1 tablet by mouth every 8 (eight) hours as needed for moderate pain.    ? Multiple Vitamin (MULTIVITAMIN) tablet Take 1 tablet by mouth daily.    ? spironolactone (ALDACTONE) 25 MG tablet TAKE 1 TABLET (25 MG TOTAL) BY MOUTH DAILY. 90 tablet 1  ? Vitamin D, Ergocalciferol, (DRISDOL) 1.25 MG (50000 UNIT) CAPS capsule TAKE 1 CAPSULE BY MOUTH EVERY 7 DAYS. 12 capsule 1  ? ?No facility-administered medications prior to visit.   ? ? ?Allergies  ?Allergen Reactions  ? Cephalexin Rash  ? Dilaudid [Hydromorphone] Other (See Comments)  ?  Hallucinations  ? ? ?ROS ?Review of Systems ?Review of Systems:  ?A fourteen system review of systems was performed and found to be positive as per HPI. ? ?  ?Objective:  ?  ?Physical Exam ?General:  Well Developed, well nourished, appropriate for stated age.  ?Neuro:  Alert and oriented,  extra-ocular muscles intact  ?HEENT:  Normocephalic, atraumatic, neck supple  ?Skin:  no gross rash. No erythema or warmth of RLE noted. ?Cardiac:  RRR, S1 S2, no murmur  ?Respiratory: CTA B/L  ?Vascular:  Ext warm, no cyanosis apprec.; cap RF less 2 sec. ?Psych:  No HI/SI, judgement and insight good, Euthymic mood. Full Affect. ? ?BP 138/76   Pulse 86   Temp 98 ?F (36.7 ?C)   Ht 6' (1.829 m)   Wt (!) 310 lb (140.6 kg)   SpO2 98%   BMI 42.04 kg/m?  ?Wt Readings from Last 3 Encounters:  ?02/18/22 (!) 310 lb (140.6 kg)  ?11/21/21 300 lb (136.1 kg)  ?10/15/21 (!) 304 lb 0.2 oz (137.9 kg)  ? ? ? ?Health Maintenance Due  ?Topic Date Due  ? Hepatitis C Screening  Never done  ? TETANUS/TDAP  Never done  ? Zoster Vaccines- Shingrix (1 of 2) Never done  ? Pneumonia Vaccine 70+ Years old (58) 05/06/2016  ? COVID-19 Vaccine (3 - Moderna risk series) 02/12/2020  ? ? ?There are no preventive care reminders to display for this patient. ? ?Lab Results  ?Component Value Date  ? TSH 1.090 10/16/2020  ? ?Lab Results  ?Component Value Date  ? WBC 7.9 10/15/2021  ? HGB 14.5 10/15/2021  ? HCT 42.3 10/15/2021  ? MCV 87.0 10/15/2021  ? PLT 246 10/15/2021  ? ?Lab Results  ?Component Value Date  ? NA 136 10/15/2021  ? K 4.1 10/15/2021  ? CO2 25 10/15/2021  ? GLUCOSE 108 (H) 10/15/2021  ? BUN 22 10/15/2021  ? CREATININE 0.93 10/15/2021  ? BILITOT 0.9 10/15/2021  ? ALKPHOS 68 10/15/2021  ? AST 19 10/15/2021  ? ALT 16 10/15/2021  ? PROT 7.6 10/15/2021  ? ALBUMIN 3.9 10/15/2021  ? CALCIUM 9.3 10/15/2021  ? ANIONGAP 9 10/15/2021  ? EGFR 73  10/10/2021  ? ?Lab Results  ?Component Value Date  ? CHOL 184 09/05/2021  ? ?Lab Results  ?Component Value Date  ? HDL 41 09/05/2021  ? ?Lab Results  ?Component Value Date  ? LDLCALC 106 (H) 09/05/2021  ? ?Lab Results  ?Compone

## 2022-02-19 ENCOUNTER — Encounter: Payer: Self-pay | Admitting: Physician Assistant

## 2022-02-19 LAB — CBC WITH DIFFERENTIAL/PLATELET
Basophils Absolute: 0.1 10*3/uL (ref 0.0–0.2)
Basos: 1 %
EOS (ABSOLUTE): 0.2 10*3/uL (ref 0.0–0.4)
Eos: 3 %
Hematocrit: 40.9 % (ref 37.5–51.0)
Hemoglobin: 13.9 g/dL (ref 13.0–17.7)
Immature Grans (Abs): 0 10*3/uL (ref 0.0–0.1)
Immature Granulocytes: 0 %
Lymphocytes Absolute: 1.5 10*3/uL (ref 0.7–3.1)
Lymphs: 19 %
MCH: 29.3 pg (ref 26.6–33.0)
MCHC: 34 g/dL (ref 31.5–35.7)
MCV: 86 fL (ref 79–97)
Monocytes Absolute: 0.6 10*3/uL (ref 0.1–0.9)
Monocytes: 7 %
Neutrophils Absolute: 5.6 10*3/uL (ref 1.4–7.0)
Neutrophils: 70 %
Platelets: 265 10*3/uL (ref 150–450)
RBC: 4.74 x10E6/uL (ref 4.14–5.80)
RDW: 13.2 % (ref 11.6–15.4)
WBC: 8 10*3/uL (ref 3.4–10.8)

## 2022-02-19 LAB — COMPREHENSIVE METABOLIC PANEL
ALT: 16 IU/L (ref 0–44)
AST: 17 IU/L (ref 0–40)
Albumin/Globulin Ratio: 1.2 (ref 1.2–2.2)
Albumin: 4.1 g/dL (ref 3.8–4.8)
Alkaline Phosphatase: 87 IU/L (ref 44–121)
BUN/Creatinine Ratio: 19 (ref 10–24)
BUN: 17 mg/dL (ref 8–27)
Bilirubin Total: 0.4 mg/dL (ref 0.0–1.2)
CO2: 22 mmol/L (ref 20–29)
Calcium: 9.6 mg/dL (ref 8.6–10.2)
Chloride: 100 mmol/L (ref 96–106)
Creatinine, Ser: 0.89 mg/dL (ref 0.76–1.27)
Globulin, Total: 3.5 g/dL (ref 1.5–4.5)
Sodium: 140 mmol/L (ref 134–144)
Total Protein: 7.6 g/dL (ref 6.0–8.5)
eGFR: 92 mL/min/{1.73_m2} (ref >59)

## 2022-02-19 LAB — BRAIN NATRIURETIC PEPTIDE: BNP: 155.2 pg/mL — ABNORMAL HIGH (ref 0.0–100.0)

## 2022-02-20 ENCOUNTER — Telehealth: Payer: Self-pay | Admitting: Cardiology

## 2022-02-20 ENCOUNTER — Encounter: Payer: Self-pay | Admitting: Podiatry

## 2022-02-20 ENCOUNTER — Ambulatory Visit: Payer: Medicare Other | Admitting: Podiatry

## 2022-02-20 DIAGNOSIS — L97522 Non-pressure chronic ulcer of other part of left foot with fat layer exposed: Secondary | ICD-10-CM | POA: Diagnosis not present

## 2022-02-20 DIAGNOSIS — L97512 Non-pressure chronic ulcer of other part of right foot with fat layer exposed: Secondary | ICD-10-CM

## 2022-02-20 MED ORDER — DOXYCYCLINE HYCLATE 100 MG PO TABS: 100.0000 mg | ORAL_TABLET | Freq: Two times a day (BID) | ORAL | 1 refills | Status: DC

## 2022-02-20 NOTE — Telephone Encounter (Signed)
? ?  Pre-operative Risk Assessment  ?  ?Patient Name: Maxi Rodas  ?DOB: 11/06/1950 ?MRN: [id number]  ? ?  ? ?Request for Surgical Clearance   ? ?Procedure:   amputation of second toe bilateral ? ?Date of Surgery:  Clearance 04/11/22                              ?   ?Surgeon:  Dr. Daylene Katayama ?Surgeon's Group or Practice Name:  [facility] ?Phone number:  [phone] ?Fax number:  [phone] ?  ?Type of Clearance Requested:   ?- Medical  ?- Pharmacy:  Hold up to cardiologist    ?  ?Type of Anesthesia:  up to anesthesiologist  ?  ?Additional requests/questions:  Please advise surgeon/provider what medications should be held. ? ?Signed, ?Leah Newnam   ?02/20/2022, 4:21 PM   ?

## 2022-02-20 NOTE — Progress Notes (Signed)
? ?HPI: 71 y.o. male presenting today for follow-up evaluation of chronic ulcers to the bilateral second toes.  Patient was last seen in the office 10/08/2021.  Patient states that recently he went to [facility] for vacation and was admitted for sepsis.  They are unsure where the sepsis was coming from but he does have chronic ulcer to the left second toe with redness and swelling.  He is currently on Keflex 500 mg 4 times daily.  Unfortunately he has now had the ulcers to the second digits for over 1 year now despite conservative and surgical correction which included flexor tenotomy of the toes to help elevate the distal tips of the toes off of the ground.  He presents for further treatment and evaluation ? ?Past Medical History:  ?Diagnosis Date  ? A-fib 10/08/2016  ? Aortic stenosis 11/06/2017  ? Atherosclerotic heart disease of native coronary artery with unspecified angina pectoris ([place]) 10/08/2016  ? Cellulitis 11/2019  ? right lower extremity  ? CHF (congestive heart failure) ([place])   ? Chronic anticoagulation 10/08/2016  ? Chronic combined systolic and diastolic heart failure ([place]) 10/08/2016  ? EF range 35-45% in 2016  ? Chronic congestive heart failure ([place])   ? Chronic ulcer of toe ([place]) 08/04/2019  ? Coagulation disorder ([place]) 04/30/2019  ? Foot drop- R 10/08/2016  ? Patient known foot drop for many years due to lumbar spinal stenosis.-Over the past couple of months symptoms have gotten worse.  He feels he is tripping more and cannot lift his foot up as much. Toes are curling and calluses on end of toes.    - Did see spinal surgeon many yrs ago.  Declined sx at that time.     ? Generalized OA 10/08/2016  ? Ortho doc-  Dr. [facility] in [place]- both knees replaced 08, 09.     ? H/O noncompliance with medical treatment, presenting hazards to health 10/22/2017  ? Heart disease   ? Hypertension   ? Hypertensive heart disease with heart failure ([place]) 11/10/2017  ? Longstanding persistent atrial fibrillation ([place])   ?  Obesity, Class III, BMI 40-49.9 (morbid obesity) ([place]) 10/08/2016  ? OSA (obstructive sleep apnea) 10/08/2016  ? Bi-PAP nitely- been 20+ yrs now  ? Osteoarthritis 11/19/2017  ? Pulmonary hypertension ([place]) 10/08/2016  ? 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 fascicular block) 11/07/2017  ? Sleep apnea with use of continuous positive airway pressure (CPAP) 10/08/2016  ? Bi-PAP nitely- been 20+ yrs now  ? Spinal stenosis, lumbar region, with neurogenic claudication 10/08/2016  ? Back specialist in [place]-    only surgeon he would use- pt declined referral here for back pain mgt    ? Stroke [facility])   ? ? ?Past Surgical History:  ?Procedure Laterality Date  ? HAMMER TOE SURGERY Right 09/28/2021  ? Middle toe  ? I & D EXTREMITY Right 10/30/2019  ? Procedure: IRRIGATION AND DEBRIDEMENT EXTREMITY, right leg possible wound vac placement;  Surgeon: Marchia Bond, MD;  Location: [facility];  Service: Orthopedics;  Laterality: Right;  ? REPLACEMENT TOTAL KNEE Bilateral   ? TIBIA FRACTURE SURGERY    ? VITRECTOMY PARS PLANA 23 GAUGE FOR ENDOPHTHALMITIS Left 03/04/2020  ? Procedure: VITRECTOMY PARS PLANA 23 GAUGE FOR ENDOPHTHALMITIS;  Surgeon: Jalene Mullet, MD;  Location: [facility];  Service: Ophthalmology;  Laterality: Left;  ? ? ?Allergies  ?Allergen Reactions  ? Cephalexin Rash  ? Dilaudid [Hydromorphone] Other (See Comments)  ?  Hallucinations  ? ? ? ? ?  ?  Physical Exam: ?General: The patient is alert and oriented x3 in no acute distress. ? ?Dermatology: Skin is warm, dry and supple bilateral lower extremities. Negative for open lesions or macerations. ? ?Vascular: Palpable pedal pulses bilaterally. Capillary refill within normal limits.  Today there is redness and swelling to the distal tips of the bilateral great toes left greater than the right ? ?Neurological: Light touch and protective threshold grossly intact ? ?Musculoskeletal Exam: Hammertoe contracture deformities noted to the second digits bilateral with ulcers to the  distal tips of the toes. ? ?Assessment: ?1.  Chronic recurrent ulcers with associated cellulitis distal tips of the bilateral second toes ? ? ?Plan of Care:  ?1. Patient evaluated.  ?2.  The patient has now had these ulcers for over 1 year and he was recently admitted for sepsis which I do suspect may have been coming from the ulcers to the distal tips of the toes.  Ulcers or nonhealing and progressively becoming worse despite conservative treatment ?3.  I do believe it is appropriate this time to discuss possible partial toe amputation of the distal tips of the bilateral second toes.  Patient and spouse both agree.  All possible complications and details of the procedure were explained.  No guarantees were expressed or implied. ?4.  Authorization for surgery was initiated today.  Surgery will consist of partial second toe amputations bilateral. ?5.  Return to clinic 1 week postop.  We will plan this about 2 weeks after the spouses knee replacement surgery which is coming up May 8th.  ?6.  In the meantime I would like the patient to continue antibiotics.  Prescription for doxycycline 100 mg 2 times daily #20. ? ?  ?  ?Edrick Kins, DPM ?[facility] ? ?Dr. Edrick Kins, DPM  ?  ?[address]                                        ?[place], [place] [address]                ?Office [phone]  ?Fax [phone] ? ? ? ? ?

## 2022-02-21 NOTE — Telephone Encounter (Signed)
I s/w the [facility] with requesting office. I did go over the recommendations from our Pharm-D if ok surgeon ok with holding Eliquis x 1 day prior and resume as soon as safely possible afterwards.  ? ?Per Darrick Penna this is fine. I will send notes to DR. Munley for appt 02/28/22. Will send FYI to requesting office.  ?

## 2022-02-21 NOTE — Telephone Encounter (Signed)
See pharmD note below to clarify with surgeon on duration of holding. When you get reply, please route back to [place] pool. Given that pt needs in person OV, no need to route back to preop pool - just helpful for MD to have anticoag rec at time of visit. Thanks! ?

## 2022-02-21 NOTE — Telephone Encounter (Signed)
? ?  Name: Joel Dominguez  ?DOB: [birth year]  ?MRN: [id number] ? ?Primary Cardiologist: Shirlee More, MD ? ?Chart reviewed as part of pre-operative protocol coverage. Because of Joel Dominguez past medical history and time since last visit, he will require a follow-up in-office visit in order to better assess preoperative cardiovascular risk. ? ?Patient has complex PMH and MyChart message from 02/12/22 indicates he was recently hospitalized in [facility] and had cardiac testing done. Therefore needs in-office visit to re-assess cardiac risk stratification. ? ?Pre-op covering staff: ?- Please schedule appointment and call patient to inform them. Has appt in June with Dr. Bettina Gavia, can we move up? ?- Please contact requesting surgeon's office via preferred method (i.e, phone, fax) to inform them of need for appointment prior to surgery. ? ?This message will also be routed to pharmacy pool for input on holding Eliquis so that this information is available to the clearing provider at time of patient's appointment.  ? ?[name], PA-C  ?02/21/2022, 2:03 PM  ? ?

## 2022-02-21 NOTE — Telephone Encounter (Signed)
I will send a message to Dr. Bettina Gavia office to schedule a sooner appt for the pt for pre op clearance. Pt has appt 6/12 but procedure is 04/11/22.  ?

## 2022-02-21 NOTE — Telephone Encounter (Signed)
Patient with diagnosis of afib on Eliquis for anticoagulation.   ? ?Procedure: amputation of second toe bilateral ?Date of procedure: 04/11/22 ? ?CHA2DS2-VASc Score = 6  ?This indicates a 9.7% annual risk of stroke. ?The patient's score is based upon: ?CHF History: 1 ?HTN History: 1 ?Diabetes History: 0 ?Stroke History: 2 ?Vascular Disease History: 1 ?Age Score: 1 ?Gender Score: 0 ?  ?CrCl >150m/min ?Platelet count 265K ? ?Would see if MD performing procedure is ok with 1 day Eliquis hold prior to procedure given pt's elevated CV risk. He should resume Eliquis as soon as safely possible afterwards. If longer hold is needed, will require MD clearance. ?

## 2022-02-26 ENCOUNTER — Encounter: Payer: Self-pay | Admitting: Physician Assistant

## 2022-02-26 ENCOUNTER — Encounter: Payer: Self-pay | Admitting: Podiatry

## 2022-02-28 ENCOUNTER — Other Ambulatory Visit: Payer: Self-pay

## 2022-02-28 ENCOUNTER — Encounter: Payer: Self-pay | Admitting: Cardiology

## 2022-02-28 ENCOUNTER — Ambulatory Visit: Payer: Medicare Other | Admitting: Cardiology

## 2022-02-28 VITALS — BP 160/76 | HR 94 | Ht 72.0 in | Wt 310.0 lb

## 2022-02-28 DIAGNOSIS — I482 Chronic atrial fibrillation, unspecified: Secondary | ICD-10-CM

## 2022-02-28 DIAGNOSIS — Z7901 Long term (current) use of anticoagulants: Secondary | ICD-10-CM

## 2022-02-28 DIAGNOSIS — I452 Bifascicular block: Secondary | ICD-10-CM

## 2022-02-28 DIAGNOSIS — I110 Hypertensive heart disease with heart failure: Secondary | ICD-10-CM | POA: Diagnosis not present

## 2022-02-28 DIAGNOSIS — Z01810 Encounter for preprocedural cardiovascular examination: Secondary | ICD-10-CM

## 2022-02-28 DIAGNOSIS — I350 Nonrheumatic aortic (valve) stenosis: Secondary | ICD-10-CM

## 2022-02-28 MED ORDER — ENTRESTO 24-26 MG PO TABS: 1.0000 | ORAL_TABLET | Freq: Two times a day (BID) | ORAL | 3 refills | Status: DC

## 2022-02-28 MED ORDER — DAPAGLIFLOZIN PROPANEDIOL 10 MG PO TABS: 10.0000 mg | ORAL_TABLET | Freq: Every day | ORAL | 3 refills | Status: DC

## 2022-02-28 MED ORDER — ENTRESTO 24-26 MG PO TABS
1.0000 | ORAL_TABLET | Freq: Two times a day (BID) | ORAL | 3 refills | Status: DC
Start: 2022-02-28 — End: 2022-02-28

## 2022-02-28 NOTE — Patient Instructions (Addendum)
Medication Instructions:  ?Your physician has recommended you make the following change in your medication:  ? ?START: Entresto 24-26 mg twice daily ?START: Farxiga 10 mg daily ? ?*If you need a refill on your cardiac medications before your next appointment, please call your pharmacy* ? ? ?Lab Work: ?Your physician recommends that you return for lab work in:  ? ?Labs in 2 weeks: BMP, Pro BNP ? ?If you have labs (blood work) drawn today and your tests are completely normal, you will receive your results only by: ?MyChart Message (if you have MyChart) OR ?A paper copy in the mail ?If you have any lab test that is abnormal or we need to change your treatment, we will call you to review the results. ? ? ?Testing/Procedures: ?None ? ? ?Follow-Up: ?At [facility], you and your health needs are our priority.  As part of our continuing mission to provide you with exceptional heart care, we have created designated Provider Care Teams.  These Care Teams include your primary Cardiologist (physician) and Advanced Practice Providers (APPs -  Physician Assistants and Nurse Practitioners) who all work together to provide you with the care you need, when you need it. ? ?We recommend signing up for the patient portal called "MyChart".  Sign up information is provided on this After Visit Summary.  MyChart is used to connect with patients for Virtual Visits (Telemedicine).  Patients are able to view lab/test results, encounter notes, upcoming appointments, etc.  Non-urgent messages can be sent to your provider as well.   ?To learn more about what you can do with MyChart, go to [email or web address].   ? ?Your next appointment:   ?3 month(s) ? ?The format for your next appointment:   ?In Person ? ?Provider:   ?Shirlee More, MD  ? ? ?Other Instructions ?None ? ?Important Information About Sugar ? ? ? ? ? ? ?

## 2022-02-28 NOTE — Progress Notes (Signed)
?Cardiology Office Note:   ? ?Date:  02/28/2022  ? ?ID:  Joel Dominguez, DOB 05/26/1951, MRN [id number] ? ?PCP:  Lorrene Reid, PA-C  ?Cardiologist:  Shirlee More, MD   ? ?Referring MD: Lorrene Reid, PA-C  ? ? ?ASSESSMENT:   ? ?1. Chronic atrial fibrillation (HCC)   ?2. Chronic anticoagulation   ?3. Hypertensive heart disease with heart failure ([place])   ?4. Nonrheumatic aortic valve stenosis   ?5. 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 fascicular block)   ?6. Preoperative cardiovascular examination   ? ?PLAN:   ? ?In order of problems listed above: ? ?Rate is controlled continue his beta-blocker and anticoagulant. ?He will hold Eliquis 2 full days prior to surgery and generally resume 24 to 48 hours after. ?Stable continue his current diuretics optimize medical treatment and Entresto MRA check renal function proBNP 2 weeks and his wife will monitor blood pressure ?Although not mentioned in the echo report he has not aortic stenosis ?Optimized for his planned surgery ? ? ?Next appointment: He is scheduled to see me routinely in June ? ? ?Medication Adjustments/Labs and Tests Ordered: ?Current medicines are reviewed at length with the patient today.  Concerns regarding medicines are outlined above.  ?Orders Placed This Encounter  ?Procedures  ? Basic Metabolic Panel (BMET)  ? Pro b natriuretic peptide  ? EKG 12-Lead  ? ?Meds ordered this encounter  ?Medications  ? sacubitril-valsartan (ENTRESTO) 24-26 MG  ?  Sig: Take 1 tablet by mouth 2 (two) times daily.  ?  Dispense:  180 tablet  ?  Refill:  3  ? dapagliflozin propanediol (FARXIGA) 10 MG TABS tablet  ?  Sig: Take 1 tablet (10 mg total) by mouth daily before breakfast.  ?  Dispense:  90 tablet  ?  Refill:  3  ? ? ?No chief complaint on file. ? ? ?History of Present Illness:   ? ?Joel Dominguez is a 71 y.o. male with a hx of  longstanding permanent atrial fibrillation mild aortic valvular stenosis hypertensive heart disease with chronic diastolic heart  failure bifascicular heart block and chronic anticoagulant therapy  last seen 10/10/2021. ? ?There have been communications between surgery anticoagulant management and preoperative pool regarding procedure for amputation of the second toe. ? ?He was seen at [facility] echocardiogram performed 02/26/2022 shows EF 40 to 45% aortic valve is described as thickened restricted but there is no gradient.   ?He was admitted with cellulitis.  On IV antibiotics EF looked to be mildly reduced and was advised to follow-up with cardiology.  Previous echocardiogram showed EF of 45 to 50% with mild aortic stenosis mean gradient of 10 mmHg. ? ?Compliance with diet, lifestyle and medications: Yes ? ?He is seen today for multiple reasons ?He is pending bilateral second toe amputation he thinks under regional anesthesia ?Recent medical hospital with cellulitis systemic inflammatory response syndrome ?An echocardiogram was performed EF is similar to before but is mildly reduced and will initiate Entresto and SGLT2 inhibitor ?No chest pain edema shortness of breath palpitation or syncope ?Past Medical History:  ?Diagnosis Date  ? A-fib 10/08/2016  ? Aortic stenosis 11/06/2017  ? Atherosclerotic heart disease of native coronary artery with unspecified angina pectoris ([place]) 10/08/2016  ? Cellulitis 11/2019  ? right lower extremity  ? CHF (congestive heart failure) ([place])   ? Chronic anticoagulation 10/08/2016  ? Chronic combined systolic and diastolic heart failure ([place]) 10/08/2016  ? EF range 35-45% in 2016  ? Chronic congestive heart failure (  [place])   ? Chronic ulcer of toe ([place]) 08/04/2019  ? Coagulation disorder ([place]) 04/30/2019  ? Foot drop- R 10/08/2016  ? Patient known foot drop for many years due to lumbar spinal stenosis.-Over the past couple of months symptoms have gotten worse.  He feels he is tripping more and cannot lift his foot up as much. Toes are curling and calluses on end of toes.    - Did see spinal surgeon many yrs  ago.  Declined sx at that time.     ? Generalized OA 10/08/2016  ? Ortho doc-  [facility]. [facility] in [place]- both knees replaced 08, 09.     ? H/O noncompliance with medical treatment, presenting hazards to health 10/22/2017  ? Heart disease   ? Hypertension   ? Hypertensive heart disease with heart failure ([place]) 11/10/2017  ? Longstanding persistent atrial fibrillation ([place])   ? Obesity, Class III, BMI 40-49.9 (morbid obesity) ([place]) 10/08/2016  ? OSA (obstructive sleep apnea) 10/08/2016  ? Bi-PAP nitely- been 20+ yrs now  ? Osteoarthritis 11/19/2017  ? Pulmonary hypertension ([place]) 10/08/2016  ? 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 fascicular block) 11/07/2017  ? Sleep apnea with use of continuous positive airway pressure (CPAP) 10/08/2016  ? Bi-PAP nitely- been 20+ yrs now  ? Spinal stenosis, lumbar region, with neurogenic claudication 10/08/2016  ? Back specialist in [place]-    only surgeon he would use- pt declined referral here for back pain mgt    ? Stroke [facility])   ? ? ?Past Surgical History:  ?Procedure Laterality Date  ? HAMMER TOE SURGERY Right 09/28/2021  ? Middle toe  ? I & D EXTREMITY Right 10/30/2019  ? Procedure: IRRIGATION AND DEBRIDEMENT EXTREMITY, right leg possible wound vac placement;  Surgeon: Marchia Bond, MD;  Location: [place];  Service: Orthopedics;  Laterality: Right;  ? REPLACEMENT TOTAL KNEE Bilateral   ? TIBIA FRACTURE SURGERY    ? VITRECTOMY PARS PLANA 23 GAUGE FOR ENDOPHTHALMITIS Left 03/04/2020  ? Procedure: VITRECTOMY PARS PLANA 23 GAUGE FOR ENDOPHTHALMITIS;  Surgeon: Jalene Mullet, MD;  Location: [place];  Service: Ophthalmology;  Laterality: Left;  ? ? ?Current Medications: ?Current Meds  ?Medication Sig  ? apixaban (ELIQUIS) 5 MG TABS tablet Take 1 tablet (5 mg total) by mouth 2 (two) times daily.  ? B Complex-C (B-COMPLEX WITH VITAMIN C) tablet Take 1 tablet by mouth daily.  ? bumetanide (BUMEX) 1 MG tablet TAKE 1 TABLET BY MOUTH EVERY DAY AND TAKE AN ADDITIONAL 1 TABLET ON MONDAY,  WEDNESDAY AND FRIDAY  ? carvedilol (COREG) 12.5 MG tablet TAKE 1 TABLET (12.5 MG TOTAL) BY MOUTH 2 (TWO) TIMES DAILY WITH A MEAL.  ? dapagliflozin propanediol (FARXIGA) 10 MG TABS tablet Take 1 tablet (10 mg total) by mouth daily before breakfast.  ? doxycycline (VIBRA-TABS) 100 MG tablet Take 1 tablet (100 mg total) by mouth 2 (two) times daily.  ? hydrocerin (EUCERIN) CREA Apply 1 application topically 2 (two) times daily.  ? HYDROcodone-acetaminophen (NORCO) 10-325 MG tablet Take 1 tablet by mouth every 8 (eight) hours as needed for moderate pain.  ? Multiple Vitamin (MULTIVITAMIN) tablet Take 1 tablet by mouth daily.  ? sacubitril-valsartan (ENTRESTO) 24-26 MG Take 1 tablet by mouth 2 (two) times daily.  ? spironolactone (ALDACTONE) 25 MG tablet TAKE 1 TABLET (25 MG TOTAL) BY MOUTH DAILY.  ? Vitamin D, Ergocalciferol, (DRISDOL) 1.25 MG (50000 UNIT) CAPS capsule TAKE 1 CAPSULE BY MOUTH EVERY 7 DAYS.  ?  ? ?  Allergies:   Cephalexin and Dilaudid [hydromorphone]  ? ?Social History  ? ?Socioeconomic History  ? Marital status: Married  ?  Spouse name: Not on file  ? Number of children: Not on file  ? Years of education: Not on file  ? Highest education level: Not on file  ?Occupational History  ? Not on file  ?Tobacco Use  ? Smoking status: Never  ?  Passive exposure: Never  ? Smokeless tobacco: Never  ?Vaping Use  ? Vaping Use: Never used  ?Substance and Sexual Activity  ? Alcohol use: Not Currently  ? Drug use: No  ? Sexual activity: Not on file  ?Other Topics Concern  ? Not on file  ?Social History Narrative  ? Not on file  ? ?Social Determinants of Health  ? ?Financial Resource Strain: Not on file  ?Food Insecurity: Not on file  ?Transportation Needs: Not on file  ?Physical Activity: Not on file  ?Stress: Not on file  ?Social Connections: Not on file  ?  ? ?Family History: ?The patient's family history includes Cancer in his father; Congestive Heart Failure in his mother and sister; Diabetes in his brother and  mother; Hypertension in his brother, mother, sister, and sister. ?ROS:   ?Please see the history of present illness.    ?All other systems reviewed and are negative. ? ?EKGs/Labs/Other Studies Reviewed:   ? ?The

## 2022-02-28 NOTE — Telephone Encounter (Signed)
Please advise 

## 2022-03-01 ENCOUNTER — Inpatient Hospital Stay
Admission: EM | Admit: 2022-03-01 | Discharge: 2022-03-07 | DRG: 854 | Disposition: A | Discharge status: Skilled Nursing Facility | Payer: Medicare Other | Attending: Internal Medicine | Admitting: Internal Medicine

## 2022-03-01 ENCOUNTER — Other Ambulatory Visit: Payer: Self-pay | Admitting: Podiatry

## 2022-03-01 ENCOUNTER — Emergency Department: Payer: Medicare Other

## 2022-03-01 ENCOUNTER — Inpatient Hospital Stay: Payer: Medicare Other

## 2022-03-01 ENCOUNTER — Other Ambulatory Visit: Payer: Self-pay

## 2022-03-01 DIAGNOSIS — I272 Pulmonary hypertension, unspecified: Secondary | ICD-10-CM | POA: Diagnosis present

## 2022-03-01 DIAGNOSIS — R6510 Systemic inflammatory response syndrome (SIRS) of non-infectious origin without acute organ dysfunction: Secondary | ICD-10-CM | POA: Diagnosis present

## 2022-03-01 DIAGNOSIS — I482 Chronic atrial fibrillation, unspecified: Secondary | ICD-10-CM | POA: Diagnosis present

## 2022-03-01 DIAGNOSIS — M86172 Other acute osteomyelitis, left ankle and foot: Secondary | ICD-10-CM | POA: Diagnosis present

## 2022-03-01 DIAGNOSIS — M21371 Foot drop, right foot: Secondary | ICD-10-CM | POA: Diagnosis present

## 2022-03-01 DIAGNOSIS — A4181 Sepsis due to Enterococcus: Secondary | ICD-10-CM | POA: Diagnosis present

## 2022-03-01 DIAGNOSIS — G8929 Other chronic pain: Secondary | ICD-10-CM | POA: Diagnosis not present

## 2022-03-01 DIAGNOSIS — Z833 Family history of diabetes mellitus: Secondary | ICD-10-CM | POA: Diagnosis not present

## 2022-03-01 DIAGNOSIS — I350 Nonrheumatic aortic (valve) stenosis: Secondary | ICD-10-CM | POA: Diagnosis present

## 2022-03-01 DIAGNOSIS — A419 Sepsis, unspecified organism: Secondary | ICD-10-CM

## 2022-03-01 DIAGNOSIS — M545 Low back pain, unspecified: Secondary | ICD-10-CM

## 2022-03-01 DIAGNOSIS — I5042 Chronic combined systolic (congestive) and diastolic (congestive) heart failure: Secondary | ICD-10-CM | POA: Diagnosis present

## 2022-03-01 DIAGNOSIS — Z96653 Presence of artificial knee joint, bilateral: Secondary | ICD-10-CM | POA: Diagnosis present

## 2022-03-01 DIAGNOSIS — M86179 Other acute osteomyelitis, unspecified ankle and foot: Secondary | ICD-10-CM | POA: Diagnosis not present

## 2022-03-01 DIAGNOSIS — Z8249 Family history of ischemic heart disease and other diseases of the circulatory system: Secondary | ICD-10-CM

## 2022-03-01 DIAGNOSIS — I340 Nonrheumatic mitral (valve) insufficiency: Secondary | ICD-10-CM | POA: Diagnosis not present

## 2022-03-01 DIAGNOSIS — G4733 Obstructive sleep apnea (adult) (pediatric): Secondary | ICD-10-CM | POA: Diagnosis present

## 2022-03-01 DIAGNOSIS — M48062 Spinal stenosis, lumbar region with neurogenic claudication: Secondary | ICD-10-CM | POA: Diagnosis present

## 2022-03-01 DIAGNOSIS — Z885 Allergy status to narcotic agent status: Secondary | ICD-10-CM | POA: Diagnosis not present

## 2022-03-01 DIAGNOSIS — I110 Hypertensive heart disease with heart failure: Secondary | ICD-10-CM | POA: Diagnosis present

## 2022-03-01 DIAGNOSIS — B952 Enterococcus as the cause of diseases classified elsewhere: Secondary | ICD-10-CM | POA: Diagnosis not present

## 2022-03-01 DIAGNOSIS — I4811 Longstanding persistent atrial fibrillation: Secondary | ICD-10-CM | POA: Diagnosis present

## 2022-03-01 DIAGNOSIS — Z6841 Body Mass Index (BMI) 40.0 and over, adult: Secondary | ICD-10-CM

## 2022-03-01 DIAGNOSIS — M869 Osteomyelitis, unspecified: Secondary | ICD-10-CM

## 2022-03-01 DIAGNOSIS — Z7984 Long term (current) use of oral hypoglycemic drugs: Secondary | ICD-10-CM | POA: Diagnosis not present

## 2022-03-01 DIAGNOSIS — Z79899 Other long term (current) drug therapy: Secondary | ICD-10-CM | POA: Diagnosis not present

## 2022-03-01 DIAGNOSIS — Z801 Family history of malignant neoplasm of trachea, bronchus and lung: Secondary | ICD-10-CM

## 2022-03-01 DIAGNOSIS — Z881 Allergy status to other antibiotic agents status: Secondary | ICD-10-CM | POA: Diagnosis not present

## 2022-03-01 DIAGNOSIS — M159 Polyosteoarthritis, unspecified: Secondary | ICD-10-CM | POA: Diagnosis present

## 2022-03-01 DIAGNOSIS — I2510 Atherosclerotic heart disease of native coronary artery without angina pectoris: Secondary | ICD-10-CM | POA: Diagnosis present

## 2022-03-01 DIAGNOSIS — M86672 Other chronic osteomyelitis, left ankle and foot: Secondary | ICD-10-CM | POA: Diagnosis not present

## 2022-03-01 DIAGNOSIS — N179 Acute kidney failure, unspecified: Secondary | ICD-10-CM | POA: Diagnosis present

## 2022-03-01 DIAGNOSIS — Z7901 Long term (current) use of anticoagulants: Secondary | ICD-10-CM

## 2022-03-01 DIAGNOSIS — M86671 Other chronic osteomyelitis, right ankle and foot: Secondary | ICD-10-CM | POA: Diagnosis not present

## 2022-03-01 DIAGNOSIS — R7881 Bacteremia: Secondary | ICD-10-CM | POA: Diagnosis not present

## 2022-03-01 DIAGNOSIS — R6520 Severe sepsis without septic shock: Secondary | ICD-10-CM | POA: Diagnosis not present

## 2022-03-01 DIAGNOSIS — L97529 Non-pressure chronic ulcer of other part of left foot with unspecified severity: Secondary | ICD-10-CM | POA: Diagnosis present

## 2022-03-01 DIAGNOSIS — I361 Nonrheumatic tricuspid (valve) insufficiency: Secondary | ICD-10-CM | POA: Diagnosis not present

## 2022-03-01 HISTORY — DX: Sepsis, unspecified organism: A41.9

## 2022-03-01 LAB — CBC WITH DIFFERENTIAL/PLATELET
Abs Immature Granulocytes: 0.09 10*3/uL — ABNORMAL HIGH (ref 0.00–0.07)
Basophils Absolute: 0.1 10*3/uL (ref 0.0–0.1)
Basophils Relative: 0 %
Eosinophils Absolute: 0 10*3/uL (ref 0.0–0.5)
Eosinophils Relative: 0 %
HCT: 43.4 % (ref 39.0–52.0)
Hemoglobin: 13.4 g/dL (ref 13.0–17.0)
Immature Granulocytes: 1 %
Lymphocytes Relative: 5 %
Lymphs Abs: 0.7 10*3/uL (ref 0.7–4.0)
MCH: 29.1 pg (ref 26.0–34.0)
MCHC: 30.9 g/dL (ref 30.0–36.0)
MCV: 94.1 fL (ref 80.0–100.0)
Monocytes Absolute: 1.1 10*3/uL — ABNORMAL HIGH (ref 0.1–1.0)
Monocytes Relative: 8 %
Neutro Abs: 11.9 10*3/uL — ABNORMAL HIGH (ref 1.7–7.7)
Neutrophils Relative %: 86 %
Platelets: 194 10*3/uL (ref 150–400)
RBC: 4.61 MIL/uL (ref 4.22–5.81)
RDW: 14.1 % (ref 11.5–15.5)
WBC: 13.9 10*3/uL — ABNORMAL HIGH (ref 4.0–10.5)
nRBC: 0 % (ref 0.0–0.2)

## 2022-03-01 LAB — URINALYSIS, ROUTINE W REFLEX MICROSCOPIC
Bilirubin Urine: NEGATIVE
Glucose, UA: NEGATIVE mg/dL
Hgb urine dipstick: NEGATIVE
Ketones, ur: 5 mg/dL — AB
Leukocytes,Ua: NEGATIVE
Nitrite: NEGATIVE
Protein, ur: NEGATIVE mg/dL
Specific Gravity, Urine: 1.019 (ref 1.005–1.030)
pH: 5 (ref 5.0–8.0)

## 2022-03-01 LAB — COMPREHENSIVE METABOLIC PANEL
ALT: 14 U/L (ref 0–44)
AST: 18 U/L (ref 15–41)
Albumin: 3.6 g/dL (ref 3.5–5.0)
Alkaline Phosphatase: 60 U/L (ref 38–126)
Anion gap: 10 (ref 5–15)
BUN: 27 mg/dL — ABNORMAL HIGH (ref 8–23)
CO2: 22 mmol/L (ref 22–32)
Calcium: 9.3 mg/dL (ref 8.9–10.3)
Chloride: 102 mmol/L (ref 98–111)
Creatinine, Ser: 1.23 mg/dL (ref 0.61–1.24)
GFR, Estimated: >60 mL/min (ref >60)
Glucose, Bld: 125 mg/dL — ABNORMAL HIGH (ref 70–99)
Potassium: 4.3 mmol/L (ref 3.5–5.1)
Sodium: 134 mmol/L — ABNORMAL LOW (ref 135–145)
Total Bilirubin: 1.5 mg/dL — ABNORMAL HIGH (ref 0.3–1.2)
Total Protein: 7.9 g/dL (ref 6.5–8.1)

## 2022-03-01 LAB — LACTIC ACID, PLASMA
Lactic Acid, Venous: 1.1 mmol/L (ref 0.5–1.9)
Lactic Acid, Venous: 1.6 mmol/L (ref 0.5–1.9)

## 2022-03-01 LAB — CBG MONITORING, ED: Glucose-Capillary: 123 mg/dL — ABNORMAL HIGH (ref 70–99)

## 2022-03-01 MED ORDER — METRONIDAZOLE 500 MG/100ML IV SOLN
500.0000 mg | Freq: Once | INTRAVENOUS | Status: AC
  Administered 2022-03-01: 500 mg via INTRAVENOUS
  Filled 2022-03-01: qty 100

## 2022-03-01 MED ORDER — ADULT MULTIVITAMIN W/MINERALS CH
1.0000 | ORAL_TABLET | Freq: Every day | ORAL | Status: DC
  Administered 2022-03-02 – 2022-03-07 (×5): 1 via ORAL
  Filled 2022-03-01 (×5): qty 1

## 2022-03-01 MED ORDER — BUMETANIDE 1 MG PO TABS
1.0000 mg | ORAL_TABLET | Freq: Every day | ORAL | Status: DC
  Administered 2022-03-02: 1 mg via ORAL
  Filled 2022-03-01: qty 1

## 2022-03-01 MED ORDER — SODIUM CHLORIDE 0.9 % IV SOLN
2.0000 g | Freq: Three times a day (TID) | INTRAVENOUS | Status: DC
  Administered 2022-03-01 – 2022-03-02 (×2): 2 g via INTRAVENOUS
  Filled 2022-03-01 (×2): qty 12.5

## 2022-03-01 MED ORDER — SODIUM CHLORIDE 0.9% FLUSH
3.0000 mL | INTRAVENOUS | Status: DC | PRN
  Administered 2022-03-02: 3 mL via INTRAVENOUS

## 2022-03-01 MED ORDER — ACETAMINOPHEN 650 MG RE SUPP: 650.0000 mg | Freq: Four times a day (QID) | RECTAL | Status: DC | PRN

## 2022-03-01 MED ORDER — FUROSEMIDE 10 MG/ML IJ SOLN
40.0000 mg | Freq: Once | INTRAMUSCULAR | Status: AC
  Administered 2022-03-01: 40 mg via INTRAVENOUS
  Filled 2022-03-01: qty 4

## 2022-03-01 MED ORDER — VANCOMYCIN HCL IN DEXTROSE 1-5 GM/200ML-% IV SOLN: 1000.0000 mg | Freq: Once | INTRAVENOUS | Status: DC

## 2022-03-01 MED ORDER — VANCOMYCIN HCL IN DEXTROSE 1-5 GM/200ML-% IV SOLN
1000.0000 mg | Freq: Once | INTRAVENOUS | Status: AC
  Administered 2022-03-01: 1000 mg via INTRAVENOUS
  Filled 2022-03-01: qty 200

## 2022-03-01 MED ORDER — ACETAMINOPHEN 325 MG PO TABS
650.0000 mg | ORAL_TABLET | Freq: Four times a day (QID) | ORAL | Status: DC | PRN
  Administered 2022-03-01 – 2022-03-03 (×3): 650 mg via ORAL
  Filled 2022-03-01 (×3): qty 2

## 2022-03-01 MED ORDER — ACETAMINOPHEN 500 MG PO TABS
1000.0000 mg | ORAL_TABLET | Freq: Once | ORAL | Status: AC
  Administered 2022-03-01: 1000 mg via ORAL
  Filled 2022-03-01: qty 2

## 2022-03-01 MED ORDER — DOXYCYCLINE HYCLATE 100 MG PO TABS: 100.0000 mg | ORAL_TABLET | Freq: Two times a day (BID) | ORAL | 1 refills | Status: DC

## 2022-03-01 MED ORDER — HYDROCODONE-ACETAMINOPHEN 10-325 MG PO TABS
1.0000 | ORAL_TABLET | Freq: Three times a day (TID) | ORAL | Status: DC | PRN
  Administered 2022-03-02 – 2022-03-07 (×12): 1 via ORAL
  Filled 2022-03-01 (×12): qty 1

## 2022-03-01 MED ORDER — MORPHINE SULFATE (PF) 2 MG/ML IV SOLN
2.0000 mg | INTRAVENOUS | Status: DC | PRN
  Administered 2022-03-01 (×2): 2 mg via INTRAVENOUS
  Filled 2022-03-01 (×2): qty 1

## 2022-03-01 MED ORDER — SODIUM CHLORIDE 0.9% FLUSH
3.0000 mL | Freq: Two times a day (BID) | INTRAVENOUS | Status: DC
  Administered 2022-03-01 – 2022-03-06 (×8): 3 mL via INTRAVENOUS

## 2022-03-01 MED ORDER — SACUBITRIL-VALSARTAN 24-26 MG PO TABS
1.0000 | ORAL_TABLET | Freq: Two times a day (BID) | ORAL | Status: DC
  Administered 2022-03-02: 1 via ORAL
  Filled 2022-03-01: qty 1

## 2022-03-01 MED ORDER — B COMPLEX-C PO TABS
1.0000 | ORAL_TABLET | Freq: Every day | ORAL | Status: DC
  Administered 2022-03-02 – 2022-03-07 (×5): 1 via ORAL
  Filled 2022-03-01 (×6): qty 1

## 2022-03-01 MED ORDER — CARVEDILOL 12.5 MG PO TABS
12.5000 mg | ORAL_TABLET | Freq: Two times a day (BID) | ORAL | Status: DC
  Administered 2022-03-01 – 2022-03-07 (×12): 12.5 mg via ORAL
  Filled 2022-03-01 (×3): qty 1
  Filled 2022-03-01: qty 2
  Filled 2022-03-01 (×6): qty 1
  Filled 2022-03-01: qty 2
  Filled 2022-03-01: qty 1

## 2022-03-01 MED ORDER — METRONIDAZOLE 500 MG/100ML IV SOLN
500.0000 mg | Freq: Two times a day (BID) | INTRAVENOUS | Status: DC
  Administered 2022-03-02: 500 mg via INTRAVENOUS
  Filled 2022-03-01: qty 100

## 2022-03-01 MED ORDER — VANCOMYCIN HCL 2000 MG/400ML IV SOLN: 2000.0000 mg | INTRAVENOUS | Status: DC

## 2022-03-01 MED ORDER — MORPHINE SULFATE (PF) 4 MG/ML IV SOLN
4.0000 mg | Freq: Once | INTRAVENOUS | Status: AC
  Administered 2022-03-01: 4 mg via INTRAVENOUS
  Filled 2022-03-01: qty 1

## 2022-03-01 MED ORDER — ONDANSETRON HCL 4 MG PO TABS: 4.0000 mg | ORAL_TABLET | Freq: Four times a day (QID) | ORAL | Status: DC | PRN

## 2022-03-01 MED ORDER — VITAMIN D (ERGOCALCIFEROL) 1.25 MG (50000 UNIT) PO CAPS: 50000.0000 [IU] | ORAL_CAPSULE | ORAL | Status: DC

## 2022-03-01 MED ORDER — VANCOMYCIN HCL 1500 MG/300ML IV SOLN
1500.0000 mg | Freq: Once | INTRAVENOUS | Status: AC
  Administered 2022-03-01: 1500 mg via INTRAVENOUS
  Filled 2022-03-01: qty 300

## 2022-03-01 MED ORDER — SODIUM CHLORIDE 0.9 % IV SOLN: 2.0000 g | Freq: Once | INTRAVENOUS | Status: DC

## 2022-03-01 MED ORDER — SPIRONOLACTONE 25 MG PO TABS
25.0000 mg | ORAL_TABLET | Freq: Every day | ORAL | Status: DC
  Administered 2022-03-02: 25 mg via ORAL
  Filled 2022-03-01: qty 1

## 2022-03-01 MED ORDER — SODIUM CHLORIDE 0.9 % IV SOLN: 250.0000 mL | INTRAVENOUS | Status: DC | PRN

## 2022-03-01 MED ORDER — DAPAGLIFLOZIN PROPANEDIOL 5 MG PO TABS
10.0000 mg | ORAL_TABLET | Freq: Every day | ORAL | Status: DC
  Administered 2022-03-02: 10 mg via ORAL
  Filled 2022-03-01: qty 1
  Filled 2022-03-01: qty 2

## 2022-03-01 MED ORDER — APIXABAN 5 MG PO TABS
5.0000 mg | ORAL_TABLET | Freq: Two times a day (BID) | ORAL | Status: DC
  Administered 2022-03-01 – 2022-03-07 (×8): 5 mg via ORAL
  Filled 2022-03-01 (×10): qty 1

## 2022-03-01 MED ORDER — ONDANSETRON HCL 4 MG/2ML IJ SOLN: 4.0000 mg | Freq: Four times a day (QID) | INTRAMUSCULAR | Status: DC | PRN

## 2022-03-01 MED ORDER — LORAZEPAM 2 MG/ML IJ SOLN
1.0000 mg | INTRAMUSCULAR | Status: DC | PRN
  Administered 2022-03-01: 1 mg via INTRAVENOUS
  Filled 2022-03-01: qty 1

## 2022-03-01 MED ORDER — GADOBUTROL 1 MMOL/ML IV SOLN
10.0000 mL | Freq: Once | INTRAVENOUS | Status: AC | PRN
  Administered 2022-03-01: 10 mL via INTRAVENOUS
  Filled 2022-03-01: qty 10

## 2022-03-01 MED ORDER — SODIUM CHLORIDE 0.9 % IV SOLN
2.0000 g | Freq: Once | INTRAVENOUS | Status: AC
  Administered 2022-03-01: 2 g via INTRAVENOUS
  Filled 2022-03-01: qty 12.5

## 2022-03-01 NOTE — Assessment & Plan Note (Addendum)
Patient meets sepsis criteria with fever, tachycardia and leukocytosis. ?Lactic acid is within normal limits and source of infection is unclear at this time. ?Concern for possible osteomyelitis involving the left foot versus an epidural abscess resulting in worsening low back pain. ?Patient already received empiric antibiotic therapy with vancomycin, cefepime and Flagyl. ?Blood cultures positive for Enterococcus bacteremia, TEE negative for endocarditis.  Repeat blood cultures done on 03/03/2022 negative so far.Marland Kitchen ?Also found to have bilateral second toe osteomyelitis s/p amputation. ?-Continue Unasyn for 6 weeks ?-PICC line placed today ? ?

## 2022-03-01 NOTE — H&P (Signed)
?History and Physical  ? ? ?Patient: Joel Dominguez MEQ:[id number] DOB: 02-10-1951 ?DOA: 03/01/2022 ?DOS: the patient was seen and examined on 03/01/2022 ?PCP: Lorrene Reid, PA-C  ?Patient coming from: Home ? ?Chief Complaint:  ?Chief Complaint  ?Patient presents with  ? Back Pain  ? ?HPI: Joel Dominguez is a 71 y.o. male with medical history significant for chronic A-fib on anticoagulation with Eliquis, mild aortic stenosis, chronic combined systolic and diastolic dysfunction CHF with last known LVEF of 40 to 45% from a 2D echocardiogram which was done 04/23, spinal stenosis, right foot drop due to lumbar spinal stenosis, hypertension, morbid obesity (BMI 42) who presents to the ER for evaluation of 2 days of worsening low back pain. ?Patient has a history of chronic low back pain which got worse over the last 2 days. ?Per patient's wife who provides most of the history he was working in the yard 2 days prior to admission and had to bend over to pull some weeds.  He subsequently developed pain in his lower back and had tried over-the-counter pain medication as well as prescription meds without any relief.  He tried to get around the house with a rolling walker but over the last 12 hours has been unable to ambulate due to worsening pain that he rates 10 x 10 in intensity at its worst.  Any movement of his legs causes worsening of his back pain.  He denies having any urinary/fecal retention or incontinence and denies having any saddle anesthesia. ?He was recently discharged from the hospital and evaluated for sepsis from right leg cellulitis. ?He denies having any cough, no shortness of breath, no headache, no dizziness, no lightheadedness, no nausea, no vomiting, no changes in his bowel habits, no focal deficits or blurred vision. ?He had a fever upon arrival to the ER with a Tmax of 100.4 and is tachycardic. ?He had room air pulse oximetry of 88% and is currently on 2 L of oxygen ? ?Review of Systems: As mentioned  in the history of present illness. All other systems reviewed and are negative. ?Past Medical History:  ?Diagnosis Date  ? A-fib 10/08/2016  ? Aortic stenosis 11/06/2017  ? Atherosclerotic heart disease of native coronary artery with unspecified angina pectoris ([place]) 10/08/2016  ? Cellulitis 11/2019  ? right lower extremity  ? CHF (congestive heart failure) ([place])   ? Chronic anticoagulation 10/08/2016  ? Chronic combined systolic and diastolic heart failure ([place]) 10/08/2016  ? EF range 35-45% in 2016  ? Chronic congestive heart failure ([place])   ? Chronic ulcer of toe ([place]) 08/04/2019  ? Coagulation disorder ([place]) 04/30/2019  ? Foot drop- R 10/08/2016  ? Patient known foot drop for many years due to lumbar spinal stenosis.-Over the past couple of months symptoms have gotten worse.  He feels he is tripping more and cannot lift his foot up as much. Toes are curling and calluses on end of toes.    - Did see spinal surgeon many yrs ago.  Declined sx at that time.     ? Generalized OA 10/08/2016  ? Ortho doc-  [facility] in [place]- both knees replaced 08, 09.     ? H/O noncompliance with medical treatment, presenting hazards to health 10/22/2017  ? Heart disease   ? Hypertension   ? Hypertensive heart disease with heart failure ([place]) 11/10/2017  ? Longstanding persistent atrial fibrillation ([place])   ? Obesity, Class III, BMI 40-49.9 (morbid obesity) ([place]) 10/08/2016  ? OSA (obstructive sleep apnea)  10/08/2016  ? Bi-PAP nitely- been 20+ yrs now  ? Osteoarthritis 11/19/2017  ? Pulmonary hypertension ([place]) 10/08/2016  ? 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 fascicular block) 11/07/2017  ? Sleep apnea with use of continuous positive airway pressure (CPAP) 10/08/2016  ? Bi-PAP nitely- been 20+ yrs now  ? Spinal stenosis, lumbar region, with neurogenic claudication 10/08/2016  ? Back specialist in [place]-    only surgeon he would use- pt declined referral here for back pain mgt    ? Stroke [facility])   ? ?Past Surgical History:  ?Procedure  Laterality Date  ? HAMMER TOE SURGERY Right 09/28/2021  ? Middle toe  ? I & D EXTREMITY Right 10/30/2019  ? Procedure: IRRIGATION AND DEBRIDEMENT EXTREMITY, right leg possible wound vac placement;  Surgeon: Marchia Bond, MD;  Location: [place];  Service: Orthopedics;  Laterality: Right;  ? REPLACEMENT TOTAL KNEE Bilateral   ? TIBIA FRACTURE SURGERY    ? VITRECTOMY PARS PLANA 23 GAUGE FOR ENDOPHTHALMITIS Left 03/04/2020  ? Procedure: VITRECTOMY PARS PLANA 23 GAUGE FOR ENDOPHTHALMITIS;  Surgeon: Jalene Mullet, MD;  Location: [place];  Service: Ophthalmology;  Laterality: Left;  ? ?Social History:  reports that he has never smoked. He has never been exposed to tobacco smoke. He has never used smokeless tobacco. He reports that he does not currently use alcohol. He reports that he does not use drugs. ? ?Allergies  ?Allergen Reactions  ? Cephalexin Rash  ? Dilaudid [Hydromorphone] Other (See Comments)  ?  Hallucinations  ? ? ?Family History  ?Problem Relation Age of Onset  ? Congestive Heart Failure Mother   ? Hypertension Mother   ? Diabetes Mother   ? Cancer Father   ?     lung  ? Congestive Heart Failure Sister   ? Hypertension Sister   ? Hypertension Brother   ? Hypertension Sister   ? Diabetes Brother   ? ? ?Prior to Admission medications   ?Medication Sig Start Date End Date Taking? Authorizing Provider  ?apixaban (ELIQUIS) 5 MG TABS tablet Take 1 tablet (5 mg total) by mouth 2 (two) times daily. 11/14/21   Richardo Priest, MD  ?B Complex-C (B-COMPLEX WITH VITAMIN C) tablet Take 1 tablet by mouth daily. 11/17/19   Love, Ivan Anchors, PA-C  ?bumetanide (BUMEX) 1 MG tablet TAKE 1 TABLET BY MOUTH EVERY DAY AND TAKE AN ADDITIONAL 1 TABLET ON MONDAY, [facility] AND FRIDAY 10/22/21   Richardo Priest, MD  ?carvedilol (COREG) 12.5 MG tablet TAKE 1 TABLET (12.5 MG TOTAL) BY MOUTH 2 (TWO) TIMES DAILY WITH A MEAL. 09/04/21   Richardo Priest, MD  ?dapagliflozin propanediol (FARXIGA) 10 MG TABS tablet Take 1 tablet (10 mg total) by  mouth daily before breakfast. 02/28/22   Richardo Priest, MD  ?doxycycline (VIBRA-TABS) 100 MG tablet Take 1 tablet (100 mg total) by mouth 2 (two) times daily. 03/01/22   Edrick Kins, DPM  ?hydrocerin (EUCERIN) CREA Apply 1 application topically 2 (two) times daily. 11/17/19   Love, Ivan Anchors, PA-C  ?HYDROcodone-acetaminophen (NORCO) 10-325 MG tablet Take 1 tablet by mouth every 8 (eight) hours as needed for moderate pain. 02/28/20   [provider]  ?Multiple Vitamin (MULTIVITAMIN) tablet Take 1 tablet by mouth daily.    [provider]  ?sacubitril-valsartan (ENTRESTO) 24-26 MG Take 1 tablet by mouth 2 (two) times daily. 02/28/22   Richardo Priest, MD  ?spironolactone (ALDACTONE) 25 MG tablet TAKE 1 TABLET (25 MG TOTAL) BY  MOUTH DAILY. 09/04/21   Richardo Priest, MD  ?Vitamin D, Ergocalciferol, (DRISDOL) 1.25 MG (50000 UNIT) CAPS capsule TAKE 1 CAPSULE BY MOUTH EVERY 7 DAYS. 10/31/21   Lorrene Reid, PA-C  ? ? ?Physical Exam: ?Vitals:  ? 03/01/22 1210 03/01/22 1215 03/01/22 1310 03/01/22 1320  ?BP: 130/81  (!) 136/49   ?Pulse: 76  84 89  ?Resp: (!) '22  20 20  '$ ?Temp: (!) 100.4 ?F (38 ?C)     ?SpO2: 91%  99% 98%  ?Weight:  (!) 140.6 kg    ?Height:  6' (1.829 m)    ? ?Physical Exam ?Vitals and nursing note reviewed.  ?Constitutional:   ?   Appearance: He is obese.  ?   Comments: Appears to be in painful distress  ?HENT:  ?   Head: Normocephalic and atraumatic.  ?   Nose: Nose normal.  ?   Mouth/Throat:  ?   Mouth: Mucous membranes are moist.  ?Eyes:  ?   Pupils: Pupils are equal, round, and reactive to light.  ?Cardiovascular:  ?   Rate and Rhythm: Tachycardia present.  ?Pulmonary:  ?   Effort: Pulmonary effort is normal.  ?Abdominal:  ?   General: Bowel sounds are normal.  ?   Palpations: Abdomen is soft.  ?   Comments: Central adiposity  ?Musculoskeletal:  ?   Cervical back: Normal range of motion and neck supple.  ?   Right lower leg: Edema present.  ?   Left lower leg: Edema present.  ?    Comments: Unable to remove lower extremities.  Redness over the left second toe with callus over the second toe  ?Skin: ?   General: Skin is warm and dry.  ?Neurological:  ?   Mental Status: He is alert.  ?   Mo

## 2022-03-01 NOTE — Assessment & Plan Note (Signed)
Continue carvedilol for rate control Continue Eliquis as primary prophylaxis for an acute stroke 

## 2022-03-01 NOTE — Assessment & Plan Note (Signed)
Treatment as outlined in 2 

## 2022-03-01 NOTE — Progress Notes (Addendum)
PHARMACY -  BRIEF ANTIBIOTIC NOTE  ? ?Pharmacy has received consult(s) for vancomycin and cefepime from an ED provider.  The patient's profile has been reviewed for ht/wt/allergies/indication/available labs. Pt with noted cephalexin allergy but has received cephalosporins in the past and tolerated (stated in notes from 02/18/2022).  ? ?One time order(s) placed for: ?Cefepime 2 g IV ?Vancomycin 2.5 g (1 g followed by 1.5 g) ? ?Further antibiotics/pharmacy consults should be ordered by admitting physician if indicated.       ?                ?Thank you, ?Yazmine Sorey O Clea Dubach ?03/01/2022  1:40 PM ? ?

## 2022-03-01 NOTE — Sepsis Progress Note (Signed)
eLink is following this Code Sepsis. °

## 2022-03-01 NOTE — Progress Notes (Signed)
Cross Cover ?[facility] radiology - MRI-Osteomyelitis left second toe "tuft" ?

## 2022-03-01 NOTE — Progress Notes (Signed)
Pharmacy Antibiotic Note ? ?Joel Dominguez is a 71 y.o. male admitted on 03/01/2022 with sepsis.  Pharmacy has been consulted for vancomycin and cefepime dosing. ? ?Plan: ?Cefepime 2 g IV Q8H ?Vancomycin 2.5 g IV x 1 loading dose followed by vancomycin 2 g Q24H ?Est AUC: 514.4  ?Used: Scr 1.23, IBW, Vd 0.5 ?Obtain vanc levels around 4th or 5th dose if continued ?Monitor renal function and adjust dose as clinically indicated ? ?Height: 6' (182.9 cm) ?Weight: (!) 140.6 kg (310 lb) ?IBW/kg (Calculated) : 77.6 ? ?Temp (24hrs), Avg:100.4 ?F (38 ?C), Min:100.4 ?F (38 ?C), Max:100.4 ?F (38 ?C) ? ?Recent Labs  ?Lab 03/01/22 ?1222  ?WBC 13.9*  ?CREATININE 1.23  ?LATICACIDVEN 1.1  ?  ?Estimated Creatinine Clearance: 81.3 mL/min (by C-G formula based on SCr of 1.23 mg/dL).   ? ?Allergies  ?Allergen Reactions  ? Cephalexin Rash  ? Dilaudid [Hydromorphone] Other (See Comments)  ?  Hallucinations  ? ? ?Antimicrobials this admission: ?4/28 metronidazole >>  ?4/28 cefepime >>  ?4/28 vancomycin >> ? ?Dose adjustments this admission: ? ? ?Microbiology results: ?4/28 BCx: pending ? ? ?Thank you for allowing pharmacy to be a part of this patient?s care. ? ?Forde Dandy Lessie Funderburke ?03/01/2022 2:50 PM ? ?

## 2022-03-01 NOTE — Progress Notes (Signed)
CODE SEPSIS - PHARMACY COMMUNICATION ? ?**Broad Spectrum Antibiotics should be administered within 1 hour of Sepsis diagnosis** ? ?Time Code Sepsis Called/Page Received: 1338 ? ?Antibiotics Ordered: vancomycin, cefepime, metronidazole ? ?Time of 1st antibiotic administration: 1438 ? ? ? ?Sherilyn Banker ,PharmD ?Clinical Pharmacist  ?03/01/2022  1:36 PM ? ?

## 2022-03-01 NOTE — Assessment & Plan Note (Addendum)
Complicates overall prognosis and care ?Lifestyle modification and exercise discussed with patient ?

## 2022-03-01 NOTE — Assessment & Plan Note (Signed)
>>  ASSESSMENT AND PLAN FOR CHRONIC COMBINED SYSTOLIC AND DIASTOLIC CHF (CONGESTIVE HEART FAILURE) (HCC) WRITTEN ON 03/01/2022  3:11 PM BY AGBATA, TOCHUKWU, MD  Patient's last known LVEF is 40 to 45% with mild aortic stenosis from a 2D echocardiogram which was done 04/23 Continue GDMT with Bumex , Farxiga  carvedilol , Entresto  and spironolactone . Maintain low-sodium diet

## 2022-03-01 NOTE — ED Provider Notes (Signed)
? ?[facility] ?Provider Note ? ? ? Event Date/Time  ? First MD Initiated Contact with Patient 03/01/22 1258   ?  (approximate) ? ? ?History  ? ?Chief Complaint ?Back Pain ? ? ?HPI ? ?Joel Dominguez is a 71 y.o. male with past medical history of hypertension, CAD, CHF, chronic atrial fibrillation on Eliquis, and pulmonary hypertension who presents to the ED complaining of back pain.  That he has been dealing with increasing pain in his lower back after bending over to help his wife pull weeds 2 days ago.  He describes the pain as sharp and spasm-like extending across the entirety of his lumbar area.  It does not radiate down either leg but he has significant pain with movement of either leg.  He deals with chronic weakness in his right lower extremity but states that now the left lower extremity is starting to feel weak.  He denies any numbness in his legs or groin, has not had any saddle anesthesia or incontinence.  He does report that he was recently admitted in [facility] earlier this month for sepsis secondary to pneumonia as well as cellulitis of his right foot.  He feels like the right foot is improved but he has also noticed a wound to his left second toe with redness around the toe.  He was discharged home from the hospital at [facility] on [place], subsequently started on doxycycline by his podiatrist due to concern for osteomyelitis. ?  ? ? ?Physical Exam  ? ?Triage Vital Signs: ?ED Triage Vitals  ?Enc Vitals Group  ?   BP 03/01/22 1210 130/81  ?   Pulse Rate 03/01/22 1210 76  ?   Resp 03/01/22 1210 (!) 22  ?   Temp 03/01/22 1210 (!) 100.4 ?F (38 ?C)  ?   Temp src --   ?   SpO2 03/01/22 1210 91 %  ?   Weight 03/01/22 1215 (!) 310 lb (140.6 kg)  ?   Height 03/01/22 1215 6' (1.829 m)  ?   Head Circumference --   ?   Peak Flow --   ?   Pain Score 03/01/22 1215 7  ?   Pain Loc --   ?   Pain Edu? --   ?   Excl. in [place]? --   ? ? ?Most recent vital signs: ?Vitals:  ? 03/01/22 1310 03/01/22  1320  ?BP: (!) 136/49   ?Pulse: 84 89  ?Resp: 20 20  ?Temp:    ?SpO2: 99% 98%  ? ? ?Constitutional: Alert and oriented. ?Eyes: Conjunctivae are normal. ?Head: Atraumatic. ?Nose: No congestion/rhinnorhea. ?Mouth/Throat: Mucous membranes are moist.  ?Cardiovascular: Normal rate, regular rhythm. Grossly normal heart sounds.  2+ radial pulses bilaterally. ?Respiratory: Normal respiratory effort.  No retractions. Lungs with crackles to bilateral bases. ?Gastrointestinal: Soft and nontender. No distention. ?Musculoskeletal: Ulceration noted to the tips of both second toes with erythema and edema extending throughout left second toe.  Midline and paraspinal lumbar tenderness to palpation. ?Neurologic:  Normal speech and language.  3 out of 5 strength in right lower extremity, chronic per patient.  4/5 strength in left lower extremity with exam limited secondary to pain.  5 out of 5 strength in bilateral upper extremities. ? ? ? ?ED Results / Procedures / Treatments  ? ?Labs ?(all labs ordered are listed, but only abnormal results are displayed) ?Labs Reviewed  ?COMPREHENSIVE METABOLIC PANEL - Abnormal; Notable for the following components:  ?    Result  Value  ? Sodium 134 (*)   ? Glucose, Bld 125 (*)   ? BUN 27 (*)   ? Total Bilirubin 1.5 (*)   ? All other components within normal limits  ?CBC WITH DIFFERENTIAL/PLATELET - Abnormal; Notable for the following components:  ? WBC 13.9 (*)   ? Neutro Abs 11.9 (*)   ? Monocytes Absolute 1.1 (*)   ? Abs Immature Granulocytes 0.09 (*)   ? All other components within normal limits  ?URINALYSIS, ROUTINE W REFLEX MICROSCOPIC - Abnormal; Notable for the following components:  ? Color, Urine YELLOW (*)   ? APPearance HAZY (*)   ? Ketones, ur 5 (*)   ? All other components within normal limits  ?CULTURE, BLOOD (ROUTINE X 2)  ?CULTURE, BLOOD (ROUTINE X 2)  ?LACTIC ACID, PLASMA  ?LACTIC ACID, PLASMA  ? ? ?RADIOLOGY ?Chest x-ray reviewed by me with bilateral pulmonary edema, no focal  infiltrate or effusion noted. ? ?PROCEDURES: ? ?Critical Care performed: Yes, see critical care procedure note(s) ? ?.Critical Care ?Performed by: Blake Divine, MD ?Authorized by: Blake Divine, MD  ? ?Critical care provider statement:  ?  Critical care time (minutes):  45 ?  Critical care time was exclusive of:  Separately billable procedures and treating other patients and teaching time ?  Critical care was necessary to treat or prevent imminent or life-threatening deterioration of the following conditions:  Sepsis ?  Critical care was time spent personally by me on the following activities:  Development of treatment plan with patient or surrogate, discussions with consultants, evaluation of patient's response to treatment, examination of patient, ordering and review of laboratory studies, ordering and review of radiographic studies, ordering and performing treatments and interventions, pulse oximetry, re-evaluation of patient's condition and review of old charts ?  I assumed direction of critical care for this patient from another provider in my specialty: no   ?  Care discussed with: admitting provider   ? ? ?MEDICATIONS ORDERED IN ED: ?Medications  ?ceFEPIme (MAXIPIME) 2 g in sodium chloride 0.9 % 100 mL IVPB (has no administration in time range)  ?metroNIDAZOLE (FLAGYL) IVPB 500 mg (has no administration in time range)  ?LORazepam (ATIVAN) injection 1 mg (has no administration in time range)  ?furosemide (LASIX) injection 40 mg (has no administration in time range)  ?vancomycin (VANCOCIN) IVPB 1000 mg/200 mL premix (has no administration in time range)  ?  Followed by  ?vancomycin (VANCOREADY) IVPB 1500 mg/300 mL (has no administration in time range)  ?acetaminophen (TYLENOL) tablet 1,000 mg (has no administration in time range)  ?morphine (PF) 4 MG/ML injection 4 mg (has no administration in time range)  ? ? ? ?IMPRESSION / MDM / ASSESSMENT AND PLAN / ED COURSE  ?I reviewed the triage vital signs and the  nursing notes. ?             ?               ? ?71 y.o. male with past medical history of hypertension, hyperlipidemia, CAD, CHF, chronic atrial fibrillation on Eliquis, and pulmonary hypertension who presents to the ED complaining of increasing bilateral lower back pain after bending over to do yard work, now with increasing weakness to his left lower extremity, noted to be febrile in the ED. ? ?Differential diagnosis includes, but is not limited to, sepsis, epidural abscess, epidural hematoma, cauda equina, lumbar strain, osteomyelitis of left second toe, pneumonia, CHF exacerbation. ? ?Patient nontoxic-appearing and in no acute distress, noted  to be febrile here in the ED at 100.4.  Initial labs are concerning for leukocytosis and patient meets sepsis criteria with his fever.  Sepsis may be secondary to developing osteomyelitis at left second toe which appears infected, however with acute onset low back pain and increasing weakness in his left lower extremity, patient is at risk for epidural abscess or discitis.  We will further assess with MRI of his lumbar spine, start patient on broad-spectrum antibiotics.  There is initial concern for hypoxic respiratory failure and chest x-ray does appear concerning for CHF exacerbation.  We will diurese with IV Lasix, patient currently maintaining O2 sats at greater than 95% on room air. ? ?X-ray of left foot is concerning for early osteomyelitis of left second toe, which could be source of his sepsis.  BMP shows no AKI or electrolyte abnormality, lactic acid within normal limits. Case discussed with hospitalist, Dr. Francine Graven, who will evaluate the patient but prefers to wait on MRI results to place orders for admission. Patient turned over to oncoming provider pending MRI results. ? ?  ? ? ?FINAL CLINICAL IMPRESSION(S) / ED DIAGNOSES  ? ?Final diagnoses:  ?Sepsis without acute organ dysfunction, due to unspecified organism [facility])  ?Toe osteomyelitis, left ([place])  ?Acute  bilateral low back pain without sciatica  ? ? ? ?Rx / DC Orders  ? ?ED Discharge Orders   ? ? None  ? ?  ? ? ? ?Note:  This document was prepared using Systems analyst and may include uninten

## 2022-03-01 NOTE — ED Triage Notes (Signed)
Pt BIB EMS from home with c/o back pain that's started 2 days after working in the yard. Pt tried OTC and prescription meds without relief. Pt unable to stand now. Pt was just discharged from hospital for sepsis a couple of weeks ago. In triage pt is 88% on room air. Pt put on 2L and came up to 95%.  ?

## 2022-03-01 NOTE — Assessment & Plan Note (Signed)
Patient has a history of chronic low back pain from lumbar spinal stenosis ?He presented in acute exacerbation of his chronic low back pain and is unable to move his extremities due to same.   ?He also has a fever and a white count concerning for possible infectious etiology as the cause of his pain. ?Follow-up results of MRI of lumbar spine ?Patient already received 1 dose of IV vancomycin, cefepime and Flagyl ?Pain control and muscle relaxant as needed ?

## 2022-03-01 NOTE — Assessment & Plan Note (Signed)
>>  ASSESSMENT AND PLAN FOR LONGSTANDING PERSISTENT ATRIAL FIBRILLATION ([place]) WRITTEN ON 03/01/2022  3:10 PM BY AGBATA, TOCHUKWU, MD  Continue carvedilol for rate control Continue Eliquis as primary prophylaxis for an acute stroke

## 2022-03-01 NOTE — Assessment & Plan Note (Signed)
Patient's last known LVEF is 40 to 45% with mild aortic stenosis from a 2D echocardiogram which was done 04/23 ?Continue GDMT with Bumex, Farxiga carvedilol, Entresto and spironolactone. ?Maintain low-sodium diet ?

## 2022-03-01 NOTE — Assessment & Plan Note (Signed)
>>  ASSESSMENT AND PLAN FOR OBESITY, CLASS III, BMI 40-49.9 (MORBID OBESITY) (HCC) WRITTEN ON 03/01/2022  3:09 PM BY AGBATA, TOCHUKWU, MD  Complicates overall prognosis and care Lifestyle modification and exercise discussed with patient

## 2022-03-02 ENCOUNTER — Inpatient Hospital Stay: Payer: Medicare Other

## 2022-03-02 DIAGNOSIS — I4811 Longstanding persistent atrial fibrillation: Secondary | ICD-10-CM | POA: Diagnosis not present

## 2022-03-02 DIAGNOSIS — M545 Low back pain, unspecified: Secondary | ICD-10-CM | POA: Diagnosis not present

## 2022-03-02 DIAGNOSIS — R6510 Systemic inflammatory response syndrome (SIRS) of non-infectious origin without acute organ dysfunction: Secondary | ICD-10-CM | POA: Diagnosis not present

## 2022-03-02 DIAGNOSIS — M48062 Spinal stenosis, lumbar region with neurogenic claudication: Secondary | ICD-10-CM | POA: Diagnosis not present

## 2022-03-02 DIAGNOSIS — A419 Sepsis, unspecified organism: Secondary | ICD-10-CM | POA: Diagnosis not present

## 2022-03-02 DIAGNOSIS — R6520 Severe sepsis without septic shock: Secondary | ICD-10-CM | POA: Diagnosis not present

## 2022-03-02 LAB — BLOOD CULTURE ID PANEL (REFLEXED) - BCID2

## 2022-03-02 LAB — BASIC METABOLIC PANEL
Anion gap: 8 (ref 5–15)
Anion gap: 6 (ref 5–15)
BUN: 24 mg/dL — ABNORMAL HIGH (ref 8–23)
BUN: 28 mg/dL — ABNORMAL HIGH (ref 8–23)
CO2: 27 mmol/L (ref 22–32)
CO2: 28 mmol/L (ref 22–32)
Calcium: 8.7 mg/dL — ABNORMAL LOW (ref 8.9–10.3)
Calcium: 8.9 mg/dL (ref 8.9–10.3)
Chloride: 100 mmol/L (ref 98–111)
Chloride: 101 mmol/L (ref 98–111)
Creatinine, Ser: 1.31 mg/dL — ABNORMAL HIGH (ref 0.61–1.24)
Creatinine, Ser: 1.44 mg/dL — ABNORMAL HIGH (ref 0.61–1.24)
GFR, Estimated: 59 mL/min — ABNORMAL LOW (ref >60)
GFR, Estimated: 52 mL/min — ABNORMAL LOW (ref >60)
Glucose, Bld: 124 mg/dL — ABNORMAL HIGH (ref 70–99)
Glucose, Bld: 121 mg/dL — ABNORMAL HIGH (ref 70–99)
Potassium: 4.2 mmol/L (ref 3.5–5.1)
Potassium: 4.5 mmol/L (ref 3.5–5.1)
Sodium: 135 mmol/L (ref 135–145)
Sodium: 135 mmol/L (ref 135–145)

## 2022-03-02 LAB — CBG MONITORING, ED: Glucose-Capillary: 134 mg/dL — ABNORMAL HIGH (ref 70–99)

## 2022-03-02 LAB — CBC
HCT: 37.9 % — ABNORMAL LOW (ref 39.0–52.0)
Hemoglobin: 12.3 g/dL — ABNORMAL LOW (ref 13.0–17.0)
MCH: 29.5 pg (ref 26.0–34.0)
MCHC: 32.5 g/dL (ref 30.0–36.0)
MCV: 90.9 fL (ref 80.0–100.0)
Platelets: 188 10*3/uL (ref 150–400)
RBC: 4.17 MIL/uL — ABNORMAL LOW (ref 4.22–5.81)
RDW: 14.2 % (ref 11.5–15.5)
WBC: 10.8 10*3/uL — ABNORMAL HIGH (ref 4.0–10.5)
nRBC: 0 % (ref 0.0–0.2)

## 2022-03-02 LAB — PROTIME-INR
INR: 1.6 — ABNORMAL HIGH (ref 0.8–1.2)
Prothrombin Time: 18.6 seconds — ABNORMAL HIGH (ref 11.4–15.2)

## 2022-03-02 LAB — CORTISOL-AM, BLOOD: Cortisol - AM: 16.1 ug/dL (ref 6.7–22.6)

## 2022-03-02 LAB — GLUCOSE, CAPILLARY
Glucose-Capillary: 111 mg/dL — ABNORMAL HIGH (ref 70–99)
Glucose-Capillary: 126 mg/dL — ABNORMAL HIGH (ref 70–99)

## 2022-03-02 LAB — PROCALCITONIN: Procalcitonin: 0.23 ng/mL

## 2022-03-02 LAB — HIV ANTIBODY (ROUTINE TESTING W REFLEX): HIV Screen 4th Generation wRfx: NONREACTIVE

## 2022-03-02 MED ORDER — CYCLOBENZAPRINE HCL 10 MG PO TABS
10.0000 mg | ORAL_TABLET | Freq: Three times a day (TID) | ORAL | Status: DC | PRN
  Administered 2022-03-02 – 2022-03-07 (×6): 10 mg via ORAL
  Filled 2022-03-02 (×6): qty 1

## 2022-03-02 MED ORDER — AMPICILLIN SODIUM 2 G IJ SOLR
2.0000 g | INTRAMUSCULAR | Status: DC
  Administered 2022-03-02 (×2): 2 g via INTRAVENOUS
  Filled 2022-03-02 (×4): qty 2000

## 2022-03-02 MED ORDER — AMPICILLIN-SULBACTAM SODIUM 1.5 (1-0.5) G IJ SOLR
1.5000 g | Freq: Four times a day (QID) | INTRAMUSCULAR | Status: DC
  Administered 2022-03-02 – 2022-03-03 (×4): 1.5 g via INTRAVENOUS
  Filled 2022-03-02: qty 1.5
  Filled 2022-03-02 (×4): qty 4

## 2022-03-02 MED ORDER — MORPHINE SULFATE (PF) 2 MG/ML IV SOLN
2.0000 mg | INTRAVENOUS | Status: DC | PRN
  Administered 2022-03-02 – 2022-03-04 (×6): 2 mg via INTRAVENOUS
  Filled 2022-03-02 (×7): qty 1

## 2022-03-02 MED ORDER — POLYETHYLENE GLYCOL 3350 17 G PO PACK
17.0000 g | PACK | Freq: Every day | ORAL | Status: DC
  Administered 2022-03-05 – 2022-03-07 (×3): 17 g via ORAL
  Filled 2022-03-02 (×4): qty 1

## 2022-03-02 MED ORDER — SODIUM CHLORIDE 0.9 % IV SOLN: INTRAVENOUS | Status: DC

## 2022-03-02 NOTE — ED Notes (Signed)
Pt floor room assignment was changed. In communication with RN to bring pt to floor. ?

## 2022-03-02 NOTE — ED Notes (Signed)
Sent med messages for missing 0800 med and 1000 meds from pharmacy. ?

## 2022-03-02 NOTE — Plan of Care (Signed)

## 2022-03-02 NOTE — ED Notes (Signed)
Patient medication per PRN for fever.   ?

## 2022-03-02 NOTE — Consult Note (Signed)
?  [facility] for Infectious Disease  Total days of antibiotics 2 ?        ?Reason for Consult:enterococcal bacteremia in the setting of osteomyelitis    ?Referring Physician: auto consult ? ?Principal Problem: ?  Sepsis ([place]) ?Active Problems: ?  Chronic combined systolic and diastolic CHF (congestive heart failure) ([place]) ?  Obesity, Class III, BMI 40-49.9 (morbid obesity) ([place]) ?  Acute exacerbation of chronic low back pain ?  Longstanding persistent atrial fibrillation ([place]) ?  Spinal stenosis, lumbar region, with neurogenic claudication ?  Systemic inflammatory response syndrome (SIRS) (HCC) ? ? ? ?HPI: Joel Dominguez is a 71 y.o. male with hx of Afib, CHF, spinal stenosis with neurogenic claudication, and chronic low back pain. He reports worsening back pain after doing yard work that did not have any relief with OTC pain meds. In ed found to have fever of 100.4, lesion to his left foot concerning for ulcer/osteomyelitis. Symptoms did not suggest cauda equina syndrome. Imaging showed of spine did not show an discitis or osteo but has some paraspinous muscular inflammation. MRi of left foot did show acute osteo to distal phalanx of left second toe. Infectious work up revealed + blood cx for e.faecalis. he was initiatially placed on broad spectrum abtx narrowed to amp/sub ? ?Past Medical History:  ?Diagnosis Date  ? A-fib 10/08/2016  ? Aortic stenosis 11/06/2017  ? Atherosclerotic heart disease of native coronary artery with unspecified angina pectoris ([place]) 10/08/2016  ? Cellulitis 11/2019  ? right lower extremity  ? CHF (congestive heart failure) ([place])   ? Chronic anticoagulation 10/08/2016  ? Chronic combined systolic and diastolic heart failure ([place]) 10/08/2016  ? EF range 35-45% in 2016  ? Chronic congestive heart failure ([place])   ? Chronic ulcer of toe ([place]) 08/04/2019  ? Coagulation disorder ([place]) 04/30/2019  ? Foot drop- R 10/08/2016  ? Patient known foot drop for many years due to lumbar spinal  stenosis.-Over the past couple of months symptoms have gotten worse.  He feels he is tripping more and cannot lift his foot up as much. Toes are curling and calluses on end of toes.    - Did see spinal surgeon many yrs ago.  Declined sx at that time.     ? Generalized OA 10/08/2016  ? Ortho doc-  Dr. [facility] in [place]- both knees replaced 08, 09.     ? H/O noncompliance with medical treatment, presenting hazards to health 10/22/2017  ? Heart disease   ? Hypertension   ? Hypertensive heart disease with heart failure ([place]) 11/10/2017  ? Longstanding persistent atrial fibrillation ([place])   ? Obesity, Class III, BMI 40-49.9 (morbid obesity) ([place]) 10/08/2016  ? OSA (obstructive sleep apnea) 10/08/2016  ? Bi-PAP nitely- been 20+ yrs now  ? Osteoarthritis 11/19/2017  ? Pulmonary hypertension ([place]) 10/08/2016  ? 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 fascicular block) 11/07/2017  ? Sleep apnea with use of continuous positive airway pressure (CPAP) 10/08/2016  ? Bi-PAP nitely- been 20+ yrs now  ? Spinal stenosis, lumbar region, with neurogenic claudication 10/08/2016  ? Back specialist in [place]-    only surgeon he would use- pt declined referral here for back pain mgt    ? Stroke [facility])   ? ? ?Allergies:  ?Allergies  ?Allergen Reactions  ? Cephalexin Rash  ? Dilaudid [Hydromorphone] Other (See Comments)  ?  Hallucinations  ? ? ?MEDICATIONS: ? apixaban  5 mg Oral BID  ? B-complex with vitamin C  1 tablet Oral Daily  ? carvedilol  12.5 mg Oral BID WC  ? multivitamin with minerals  1 tablet Oral Daily  ? polyethylene glycol  17 g Oral Daily  ? sodium chloride flush  3 mL Intravenous Q12H  ? [START ON 03/08/2022] Vitamin D (Ergocalciferol)  50,000 Units Oral Q7 days  ? ? ?Social History  ? ?Tobacco Use  ? Smoking status: Never  ?  Passive exposure: Never  ? Smokeless tobacco: Never  ?Vaping Use  ? Vaping Use: Never used  ?Substance Use Topics  ? Alcohol use: Not Currently  ? Drug use: No  ? ? ?Family History  ?Problem Relation Age  of Onset  ? Congestive Heart Failure Mother   ? Hypertension Mother   ? Diabetes Mother   ? Cancer Father   ?     lung  ? Congestive Heart Failure Sister   ? Hypertension Sister   ? Hypertension Brother   ? Hypertension Sister   ? Diabetes Brother   ? ? ?Review of Systems -  ?Back pain and fevers. 12 point ros otherwise negative ? ?OBJECTIVE: ?Temp:  [98.4 ?F (36.9 ?C)-102 ?F (38.9 ?C)] 99.3 ?F (37.4 ?C) (04/29 1041) ?Pulse Rate:  [40-109] 40 (04/29 1041) ?Resp:  [18-20] 18 (04/29 1041) ?BP: (103-178)/(63-108) 121/64 (04/29 1041) ?SpO2:  [92 %-99 %] 94 % (04/29 1041) ? ? ?LABS: ?Results for orders placed or performed during the hospital encounter of 03/01/22 (from the past 48 hour(s))  ?Lactic acid, plasma     Status: None  ? Collection Time: 03/01/22 12:22 PM  ?Result Value Ref Range  ? Lactic Acid, Venous 1.1 0.5 - 1.9 mmol/L  ?  Comment: Performed at [facility], [address] [place], [place] [address]  ?Comprehensive metabolic panel     Status: Abnormal  ? Collection Time: 03/01/22 12:22 PM  ?Result Value Ref Range  ? Sodium 134 (L) 135 - 145 mmol/L  ? Potassium 4.3 3.5 - 5.1 mmol/L  ? Chloride 102 98 - 111 mmol/L  ? CO2 22 22 - 32 mmol/L  ? Glucose, Bld 125 (H) 70 - 99 mg/dL  ?  Comment: Glucose reference range applies only to samples taken after fasting for at least 8 hours.  ? BUN 27 (H) 8 - 23 mg/dL  ? Creatinine, Ser 1.23 0.61 - 1.24 mg/dL  ? Calcium 9.3 8.9 - 10.3 mg/dL  ? Total Protein 7.9 6.5 - 8.1 g/dL  ? Albumin 3.6 3.5 - 5.0 g/dL  ? AST 18 15 - 41 U/L  ? ALT 14 0 - 44 U/L  ? Alkaline Phosphatase 60 38 - 126 U/L  ? Total Bilirubin 1.5 (H) 0.3 - 1.2 mg/dL  ? GFR, Estimated >60 >60 mL/min  ?  Comment: (NOTE) ?Calculated using the CKD-EPI Creatinine Equation (2021) ?  ? Anion gap 10 5 - 15  ?  Comment: Performed at [facility], [address] [place], [place] [address]  ?CBC with Differential     Status: Abnormal  ? Collection Time: 03/01/22 12:22 PM  ?Result Value Ref Range  ?  WBC 13.9 (H) 4.0 - 10.5 K/uL  ? RBC 4.61 4.22 - 5.81 MIL/uL  ? Hemoglobin 13.4 13.0 - 17.0 g/dL  ? HCT 43.4 39.0 - 52.0 %  ? MCV 94.1 80.0 - 100.0 fL  ? MCH 29.1 26.0 - 34.0 pg  ? MCHC 30.9 30.0 - 36.0 g/dL  ? RDW 14.1 11.5 - 15.5 %  ? Platelets 194  150 - 400 K/uL  ? nRBC 0.0 0.0 - 0.2 %  ? Neutrophils Relative % 86 %  ? Neutro Abs 11.9 (H) 1.7 - 7.7 K/uL  ? Lymphocytes Relative 5 %  ? Lymphs Abs 0.7 0.7 - 4.0 K/uL  ? Monocytes Relative 8 %  ? Monocytes Absolute 1.1 (H) 0.1 - 1.0 K/uL  ? Eosinophils Relative 0 %  ? Eosinophils Absolute 0.0 0.0 - 0.5 K/uL  ? Basophils Relative 0 %  ? Basophils Absolute 0.1 0.0 - 0.1 K/uL  ? Immature Granulocytes 1 %  ? Abs Immature Granulocytes 0.09 (H) 0.00 - 0.07 K/uL  ?  Comment: Performed at [facility], [address], [place], [place] [address]  ?Culture, blood (routine x 2)     Status: None (Preliminary result)  ? Collection Time: 03/01/22 12:22 PM  ? Specimen: BLOOD  ?Result Value Ref Range  ? Specimen Description BLOOD LEFT HAND   ? Special Requests    ?  BOTTLES DRAWN AEROBIC AND ANAEROBIC Blood Culture results may not be optimal due to an inadequate volume of blood received in culture bottles  ? Culture  Setup Time    ?  GRAM POSITIVE COCCI ?IN BOTH AEROBIC AND ANAEROBIC BOTTLES ?CRITICAL RESULT CALLED TO, READ BACK BY AND VERIFIED WITH: ?JASON ROBINS @ 0535 02/27/22 LFD ?Performed at [facility], [address], [place], [place] [address] ?  ? Culture GRAM POSITIVE COCCI   ? Report Status PENDING   ?Urinalysis, Routine w reflex microscopic     Status: Abnormal  ? Collection Time: 03/01/22 12:40 PM  ?Result Value Ref Range  ? Color, Urine YELLOW (A) YELLOW  ? APPearance HAZY (A) CLEAR  ? Specific Gravity, Urine 1.019 1.005 - 1.030  ? pH 5.0 5.0 - 8.0  ? Glucose, UA NEGATIVE NEGATIVE mg/dL  ? Hgb urine dipstick NEGATIVE NEGATIVE  ? Bilirubin Urine NEGATIVE NEGATIVE  ? Ketones, ur 5 (A) NEGATIVE mg/dL  ? Protein, ur NEGATIVE NEGATIVE mg/dL  ? Nitrite  NEGATIVE NEGATIVE  ? Leukocytes,Ua NEGATIVE NEGATIVE  ?  Comment: Performed at [facility], [address], [place], [place] [address]  ?Culture, blood (routine x 2)     Status: None (Preliminary result)

## 2022-03-02 NOTE — Progress Notes (Addendum)
?PROGRESS NOTE ? ? ? ?Joel Dominguez  GGY:[id number] DOB: August 09, 1951 DOA: 03/01/2022 ?PCP: Lorrene Reid, PA-C  ?Outpatient Specialists: podiatry ? ? ? ?Brief Narrative:  ? ?From admission h and p ?Joel Dominguez is a 72 y.o. male with medical history significant for chronic A-fib on anticoagulation with Eliquis, mild aortic stenosis, chronic combined systolic and diastolic dysfunction CHF with last known LVEF of 40 to 45% from a 2D echocardiogram which was done 04/23, spinal stenosis, right foot drop due to lumbar spinal stenosis, hypertension, morbid obesity (BMI 42) who presents to the ER for evaluation of 2 days of worsening low back pain. ?Patient has a history of chronic low back pain which got worse over the last 2 days. ?Per patient's wife who provides most of the history he was working in the yard 2 days prior to admission and had to bend over to pull some weeds.  He subsequently developed pain in his lower back and had tried over-the-counter pain medication as well as prescription meds without any relief.  He tried to get around the house with a rolling walker but over the last 12 hours has been unable to ambulate due to worsening pain that he rates 10 x 10 in intensity at its worst.  Any movement of his legs causes worsening of his back pain.  He denies having any urinary/fecal retention or incontinence and denies having any saddle anesthesia. ?He was recently discharged from the hospital and evaluated for sepsis from right leg cellulitis. ?He denies having any cough, no shortness of breath, no headache, no dizziness, no lightheadedness, no nausea, no vomiting, no changes in his bowel habits, no focal deficits or blurred vision. ?He had a fever upon arrival to the ER with a Tmax of 100.4 and is tachycardic. ? ? ?Assessment & Plan: ?  ?Principal Problem: ?  Sepsis ([place]) ?Active Problems: ?  Systemic inflammatory response syndrome (SIRS) ([facility]) ?  Acute exacerbation of chronic low back pain ?  Spinal  stenosis, lumbar region, with neurogenic claudication ?  Chronic combined systolic and diastolic CHF (congestive heart failure) ([place]) ?  Longstanding persistent atrial fibrillation ([place]) ?  Obesity, Class III, BMI 40-49.9 (morbid obesity) ([place]) ? ?# Sepsis ?2/2 bacteremia. By leukocytosis, fever. Fluid resuscitated, hemodynamically stable ?- continue IVF and abx as below ? ?# Enterococcus bacteremia ?In 3/4 blood cultures. Presumably from left toe osteo ?- f/u sensitivities ?- ampicillin per pharmacy ?- tte ?- ID consult on Monday ? ?# Osteomyelitis ?Of left 2nd toe, on MRI ?- podiatry consulted ? ?# AKI ?Mild, 2/2 sepsis ?- ivf ?- monitor ? ?# Low back pain ?MRI with multilevel ddd, no signs infection ?- pain control ?- PT/OT ? ?# A fib ?Rate controlled ?- cont home coreg, apixaban ? ?# dCHF ?Appears compensated. Pressures soft ?- cont home coreg ?- hold home entresto, spiro ?- resume bumex when BP improves ?- stop farxiga, contraindicated given lower extremity infection ? ?# OSA ?- cpap qhs ? ?# morbid obesity ? ?DVT prophylaxis: lovenox ?Code Status: full ?Family Communication: wife updated telephonically 4/29 ? ?Level of care: Med-Surg ?Status is: Inpatient ?Remains inpatient appropriate because: severity of illness ? ? ? ?Consultants:  ?podiatry ? ?Procedures: ?none ? ?Antimicrobials:  ?ampicillin  ? ? ?Subjective: ?Severe low back pain. Fever. No n/v.  ? ?Objective: ?Vitals:  ? 03/02/22 0600 03/02/22 0700 03/02/22 0800 03/02/22 0900  ?BP: (!) 152/63 135/67 103/67 106/67  ?Pulse: 95 (!) 109 96 93  ?Resp: '20 20 20 20  '$ ?Temp: Marland Kitchen)  101.1 ?F (38.4 ?C) 99.1 ?F (37.3 ?C)    ?TempSrc: Oral Oral    ?SpO2: 97% 97% 98% 92%  ?Weight:      ?Height:      ? ? ?Intake/Output Summary (Last 24 hours) at 03/02/2022 0947 ?Last data filed at 03/02/2022 0830 ?Gross per 24 hour  ?Intake 961.83 ml  ?Output 850 ml  ?Net 111.83 ml  ? ?Filed Weights  ? 03/01/22 1215  ?Weight: (!) 140.6 kg  ? ? ?Examination: ? ?General exam: in pain.  diaphoretic ?Respiratory system: Clear to auscultation. Respiratory effort normal. ?Cardiovascular system: S1 & S2 heard, RRR. No JVD, murmurs, rubs, gallops or clicks. 1+ le edema ?Gastrointestinal system: Abdomen is obese, soft and nontender. No organomegaly or masses felt. Normal bowel sounds heard. ?Central nervous system: Alert and oriented. No focal neurological deficits. ?Extremities: Symmetric 5 x 5 power. Ulcer distal 2nd toes ?Skin: toe ulcers as above ?Psychiatry: Judgement and insight appear normal. Mood & affect appropriate.  ? ? ? ?Data Reviewed: I have personally reviewed following labs and imaging studies ? ?CBC: ?Recent Labs  ?Lab 03/01/22 ?1222 03/02/22 ?8413  ?WBC 13.9* 10.8*  ?NEUTROABS 11.9*  --   ?HGB 13.4 12.3*  ?HCT 43.4 37.9*  ?MCV 94.1 90.9  ?PLT 194 188  ? ?Basic Metabolic Panel: ?Recent Labs  ?Lab 03/01/22 ?1222 03/02/22 ?2440  ?NA 134* 135  ?K 4.3 4.2  ?CL 102 100  ?CO2 22 27  ?GLUCOSE 125* 124*  ?BUN 27* 24*  ?CREATININE 1.23 1.31*  ?CALCIUM 9.3 8.7*  ? ?GFR: ?Estimated Creatinine Clearance: 76.3 mL/min (A) (by C-G formula based on SCr of 1.31 mg/dL (H)). ?Liver Function Tests: ?Recent Labs  ?Lab 03/01/22 ?1222  ?AST 18  ?ALT 14  ?ALKPHOS 60  ?BILITOT 1.5*  ?PROT 7.9  ?ALBUMIN 3.6  ? ?No results for input(s): LIPASE, AMYLASE in the last 168 hours. ?No results for input(s): AMMONIA in the last 168 hours. ?Coagulation Profile: ?Recent Labs  ?Lab 03/02/22 ?1027  ?INR 1.6*  ? ?Cardiac Enzymes: ?No results for input(s): CKTOTAL, CKMB, CKMBINDEX, TROPONINI in the last 168 hours. ?BNP (last 3 results) ?Recent Labs  ?  10/10/21 ?1151  ?PROBNP 508*  ? ?HbA1C: ?No results for input(s): HGBA1C in the last 72 hours. ?CBG: ?Recent Labs  ?Lab 03/01/22 ?2033 03/02/22 ?0829  ?GLUCAP 123* 134*  ? ?Lipid Profile: ?No results for input(s): CHOL, HDL, LDLCALC, TRIG, CHOLHDL, LDLDIRECT in the last 72 hours. ?Thyroid Function Tests: ?No results for input(s): TSH, T4TOTAL, FREET4, T3FREE, THYROIDAB in the  last 72 hours. ?Anemia Panel: ?No results for input(s): VITAMINB12, FOLATE, FERRITIN, TIBC, IRON, RETICCTPCT in the last 72 hours. ?Urine analysis: ?   ?Component Value Date/Time  ? COLORURINE YELLOW (A) 03/01/2022 1240  ? APPEARANCEUR HAZY (A) 03/01/2022 1240  ? APPEARANCEUR Clear 08/28/2018 1150  ? LABSPEC 1.019 03/01/2022 1240  ? PHURINE 5.0 03/01/2022 1240  ? GLUCOSEU NEGATIVE 03/01/2022 1240  ? [place] NEGATIVE 03/01/2022 1240  ? [place] NEGATIVE 03/01/2022 1240  ? BILIRUBINUR Negative 08/28/2018 1150  ? KETONESUR 5 (A) 03/01/2022 1240  ? PROTEINUR NEGATIVE 03/01/2022 1240  ? NITRITE NEGATIVE 03/01/2022 1240  ? LEUKOCYTESUR NEGATIVE 03/01/2022 1240  ? ?Sepsis Labs: ?'@LABRCNTIP'$ (procalcitonin:4,lacticidven:4) ? ?) ?Recent Results (from the past 240 hour(s))  ?Culture, blood (routine x 2)     Status: None (Preliminary result)  ? Collection Time: 03/01/22 12:22 PM  ? Specimen: BLOOD  ?Result Value Ref Range Status  ? Specimen Description BLOOD LEFT HAND  Final  ?  Special Requests   Final  ?  BOTTLES DRAWN AEROBIC AND ANAEROBIC Blood Culture results may not be optimal due to an inadequate volume of blood received in culture bottles  ? Culture  Setup Time   Final  ?  GRAM POSITIVE COCCI ?IN BOTH AEROBIC AND ANAEROBIC BOTTLES ?CRITICAL RESULT CALLED TO, READ BACK BY AND VERIFIED WITH: ?JASON ROBINS @ 0535 02/27/22 LFD ?Performed at [facility], [address] [place], [place] [address] ?  ? Culture GRAM POSITIVE COCCI  Final  ? Report Status PENDING  Incomplete  ?Culture, blood (routine x 2)     Status: None (Preliminary result)  ? Collection Time: 03/01/22  1:30 PM  ? Specimen: BLOOD  ?Result Value Ref Range Status  ? Specimen Description   Final  ?  BLOOD RIGHT ANTECUBITAL ?Performed at [facility], [address] [place], [place] [address] ?  ? Special Requests   Final  ?  BOTTLES DRAWN AEROBIC AND ANAEROBIC Blood Culture results may not be optimal due to an inadequate volume of blood  received in culture bottles ?Performed at [facility], [address] [place], [place] [address] ?  ? Culture  Setup Time   Final  ?  GRAM POSITIVE COCCI ?ANAEROBIC BOTTLE ONLY ?CRITICAL RESULT CALLED TO, READ

## 2022-03-02 NOTE — ED Notes (Signed)
Stopped antibiotic infusion after starting due to order d/c and new medications being ordered  ?

## 2022-03-02 NOTE — ED Notes (Signed)
Dr Si Raider at bedside. Pt complaining of severe lower back pain. Attempting to give AM PO meds at this time.  ?

## 2022-03-02 NOTE — Progress Notes (Signed)
PT Cancellation Note ? ?Patient Details ?Name: Joel Dominguez ?MRN: [id number] ?DOB: 1951/05/04 ? ? ?Cancelled Treatment:    Reason Eval/Treat Not Completed: Patient at procedure or test/unavailable PT orders received, chart reviewed. During AM, nurse notified OT that pt was in too much pain to participate at this time. PT f/u after lunch but pt off unit for [place]. Will f/u as able & as pt is able/available to participate. ? ?Lavone Nian, PT, DPT ?03/02/22, 1:35 PM ? ? ?Joel Dominguez ?03/02/2022, 1:35 PM ?

## 2022-03-02 NOTE — ED Notes (Signed)
Patient resting quietly at this time.  Respirations even and unlabored.   

## 2022-03-02 NOTE — ED Notes (Signed)
Patient assisted to take small sips of water.  States his throat was dry.  No other complaints voiced ?

## 2022-03-02 NOTE — Progress Notes (Signed)
PHARMACY - PHYSICIAN COMMUNICATION ?CRITICAL VALUE ALERT - BLOOD CULTURE IDENTIFICATION (BCID) ? ?Joel Dominguez is an 71 y.o. male who presented to [facility] on 03/01/2022 with a chief complaint of CHF exacerbation, osteomyelitis.  ? ?Assessment:  E faecalis in 2 of 4 bottles , no resistance (include suspected source if known) ? ?Name of physician (or Provider) Contacted: Sharion Settler, NP  ? ?Current antibiotics: Vanc, Cefepime , metronidazole  ? ?Changes to prescribed antibiotics recommended:  ?[name] and start Ampicillin 2 gm IV Q4H  ? ?Results for orders placed or performed during the hospital encounter of 03/01/22  ?Blood Culture ID Panel (Reflexed) (Collected: 03/01/2022  1:30 PM)  ?Result Value Ref Range  ? Enterococcus faecalis DETECTED (A) NOT DETECTED  ? Enterococcus Faecium NOT DETECTED NOT DETECTED  ? Listeria monocytogenes NOT DETECTED NOT DETECTED  ? Staphylococcus species NOT DETECTED NOT DETECTED  ? Staphylococcus aureus (BCID) NOT DETECTED NOT DETECTED  ? Staphylococcus epidermidis NOT DETECTED NOT DETECTED  ? Staphylococcus lugdunensis NOT DETECTED NOT DETECTED  ? Streptococcus species NOT DETECTED NOT DETECTED  ? Streptococcus agalactiae NOT DETECTED NOT DETECTED  ? Streptococcus pneumoniae NOT DETECTED NOT DETECTED  ? Streptococcus pyogenes NOT DETECTED NOT DETECTED  ? A.calcoaceticus-baumannii NOT DETECTED NOT DETECTED  ? Bacteroides fragilis NOT DETECTED NOT DETECTED  ? Enterobacterales NOT DETECTED NOT DETECTED  ? Enterobacter cloacae complex NOT DETECTED NOT DETECTED  ? Escherichia coli NOT DETECTED NOT DETECTED  ? Klebsiella aerogenes NOT DETECTED NOT DETECTED  ? Klebsiella oxytoca NOT DETECTED NOT DETECTED  ? Klebsiella pneumoniae NOT DETECTED NOT DETECTED  ? Proteus species NOT DETECTED NOT DETECTED  ? Salmonella species NOT DETECTED NOT DETECTED  ? Serratia marcescens NOT DETECTED NOT DETECTED  ? Haemophilus influenzae NOT DETECTED NOT DETECTED  ? Neisseria meningitidis  NOT DETECTED NOT DETECTED  ? Pseudomonas aeruginosa NOT DETECTED NOT DETECTED  ? Stenotrophomonas maltophilia NOT DETECTED NOT DETECTED  ? Candida albicans NOT DETECTED NOT DETECTED  ? Candida auris NOT DETECTED NOT DETECTED  ? Candida glabrata NOT DETECTED NOT DETECTED  ? Candida krusei NOT DETECTED NOT DETECTED  ? Candida parapsilosis NOT DETECTED NOT DETECTED  ? Candida tropicalis NOT DETECTED NOT DETECTED  ? Cryptococcus neoformans/gattii NOT DETECTED NOT DETECTED  ? Vancomycin resistance NOT DETECTED NOT DETECTED  ? ? ?Joel Dominguez ?03/02/2022  6:38 AM ? ?

## 2022-03-02 NOTE — ED Notes (Signed)
Wrote to pharmacy for missing Farxiga tab. ?

## 2022-03-02 NOTE — Progress Notes (Signed)
OT Cancellation Note ? ?Patient Details ?Name: Joel Dominguez ?MRN: [id number] ?DOB: 1951-02-18 ? ? ?Cancelled Treatment:    Reason Eval/Treat Not Completed: Pain limiting ability to participate.  Pt asked for therapy to attempt tomorrow. ? ?Leta Speller, MS, OTR/L ? ?Darleene Cleaver ?03/02/2022, 11:58 AM ?

## 2022-03-03 ENCOUNTER — Encounter: Payer: Self-pay | Admitting: Podiatry

## 2022-03-03 ENCOUNTER — Inpatient Hospital Stay: Payer: Medicare Other

## 2022-03-03 ENCOUNTER — Inpatient Hospital Stay: Payer: Medicare Other | Admitting: Certified Registered"

## 2022-03-03 ENCOUNTER — Other Ambulatory Visit: Payer: Self-pay

## 2022-03-03 ENCOUNTER — Inpatient Hospital Stay (HOSPITAL_COMMUNITY)
Admit: 2022-03-03 | Discharge: 2022-03-03 | Disposition: A | Discharge status: Home / Self Care | Payer: Medicare Other | Attending: Obstetrics and Gynecology | Admitting: Obstetrics and Gynecology

## 2022-03-03 ENCOUNTER — Encounter
Admission: EM | Disposition: A | Discharge status: Skilled Nursing Facility | Payer: Self-pay | Source: Home / Self Care | Attending: Obstetrics and Gynecology

## 2022-03-03 DIAGNOSIS — M86671 Other chronic osteomyelitis, right ankle and foot: Secondary | ICD-10-CM | POA: Diagnosis not present

## 2022-03-03 DIAGNOSIS — A4181 Sepsis due to Enterococcus: Secondary | ICD-10-CM | POA: Insufficient documentation

## 2022-03-03 DIAGNOSIS — R7881 Bacteremia: Secondary | ICD-10-CM | POA: Diagnosis not present

## 2022-03-03 DIAGNOSIS — M86672 Other chronic osteomyelitis, left ankle and foot: Secondary | ICD-10-CM | POA: Diagnosis not present

## 2022-03-03 DIAGNOSIS — A419 Sepsis, unspecified organism: Secondary | ICD-10-CM | POA: Diagnosis not present

## 2022-03-03 DIAGNOSIS — R6520 Severe sepsis without septic shock: Secondary | ICD-10-CM | POA: Diagnosis not present

## 2022-03-03 HISTORY — DX: Morbid (severe) obesity due to excess calories: E66.01

## 2022-03-03 HISTORY — PX: AMPUTATION TOE: SHX6595

## 2022-03-03 LAB — CBC
HCT: 40.3 % (ref 39.0–52.0)
Hemoglobin: 12.8 g/dL — ABNORMAL LOW (ref 13.0–17.0)
MCH: 28.2 pg (ref 26.0–34.0)
MCHC: 31.8 g/dL (ref 30.0–36.0)
MCV: 88.8 fL (ref 80.0–100.0)
Platelets: 204 10*3/uL (ref 150–400)
RBC: 4.54 MIL/uL (ref 4.22–5.81)
RDW: 14.1 % (ref 11.5–15.5)
WBC: 8.9 10*3/uL (ref 4.0–10.5)
nRBC: 0 % (ref 0.0–0.2)

## 2022-03-03 LAB — ECHOCARDIOGRAM COMPLETE
AR max vel: 1.87 cm2
AV Area VTI: 2.27 cm2
AV Area mean vel: 2.12 cm2
AV Mean grad: 7 mmHg
AV Peak grad: 13.4 mmHg
Ao pk vel: 1.83 m/s
Area-P 1/2: 6.37 cm2
Calc EF: 54.9 %
Height: 72 in
MV VTI: 2.94 cm2
P 1/2 time: 453 msec
Single Plane A2C EF: 53 %
Single Plane A4C EF: 54.1 %
Weight: 4960 oz

## 2022-03-03 SURGERY — AMPUTATION, TOE
Anesthesia: Monitor Anesthesia Care | Site: Toe | Laterality: Bilateral

## 2022-03-03 MED ORDER — ONDANSETRON HCL 4 MG/2ML IJ SOLN: 4.0000 mg | Freq: Once | INTRAMUSCULAR | Status: DC | PRN

## 2022-03-03 MED ORDER — LIDOCAINE HCL (PF) 1 % IJ SOLN
INTRAMUSCULAR | Status: DC | PRN
  Administered 2022-03-03: 15 mL

## 2022-03-03 MED ORDER — 0.9 % SODIUM CHLORIDE (POUR BTL) OPTIME
TOPICAL | Status: DC | PRN
  Administered 2022-03-03: 200 mL

## 2022-03-03 MED ORDER — DEXMEDETOMIDINE HCL IN NACL 80 MCG/20ML IV SOLN
INTRAVENOUS | Status: AC
  Filled 2022-03-03: qty 20

## 2022-03-03 MED ORDER — SODIUM CHLORIDE 0.9 % IV SOLN
3.0000 g | Freq: Four times a day (QID) | INTRAVENOUS | Status: DC
  Filled 2022-03-03 (×2): qty 8

## 2022-03-03 MED ORDER — LIDOCAINE HCL (PF) 2 % IJ SOLN
INTRAMUSCULAR | Status: AC
  Filled 2022-03-03: qty 5

## 2022-03-03 MED ORDER — PROPOFOL 10 MG/ML IV BOLUS
INTRAVENOUS | Status: AC
  Filled 2022-03-03: qty 40

## 2022-03-03 MED ORDER — FENTANYL CITRATE (PF) 100 MCG/2ML IJ SOLN
INTRAMUSCULAR | Status: AC
Start: 2022-03-03 — End: 2022-03-04
  Filled 2022-03-03: qty 2

## 2022-03-03 MED ORDER — SODIUM CHLORIDE 0.9 % IV SOLN
2.0000 g | INTRAVENOUS | Status: DC
  Administered 2022-03-03 – 2022-03-07 (×24): 2 g via INTRAVENOUS
  Filled 2022-03-03: qty 2000
  Filled 2022-03-03: qty 2
  Filled 2022-03-03: qty 2000
  Filled 2022-03-03: qty 2
  Filled 2022-03-03: qty 2000
  Filled 2022-03-03 (×2): qty 2
  Filled 2022-03-03 (×2): qty 2000
  Filled 2022-03-03 (×2): qty 2
  Filled 2022-03-03 (×4): qty 2000
  Filled 2022-03-03: qty 2
  Filled 2022-03-03: qty 2000
  Filled 2022-03-03 (×10): qty 2

## 2022-03-03 MED ORDER — DEXMEDETOMIDINE HCL IN NACL 200 MCG/50ML IV SOLN
INTRAVENOUS | Status: DC | PRN
  Administered 2022-03-03: 20 ug via INTRAVENOUS

## 2022-03-03 MED ORDER — SODIUM CHLORIDE 0.9 % IV SOLN: INTRAVENOUS | Status: DC

## 2022-03-03 MED ORDER — FENTANYL CITRATE (PF) 100 MCG/2ML IJ SOLN
25.0000 ug | INTRAMUSCULAR | Status: DC | PRN
  Administered 2022-03-03: 25 ug via INTRAVENOUS

## 2022-03-03 MED ORDER — PERFLUTREN LIPID MICROSPHERE
1.0000 mL | INTRAVENOUS | Status: AC | PRN
  Administered 2022-03-03: 3 mL via INTRAVENOUS
  Filled 2022-03-03: qty 10

## 2022-03-03 MED ORDER — PROPOFOL 500 MG/50ML IV EMUL
INTRAVENOUS | Status: DC | PRN
Start: 2022-03-03 — End: 2022-03-03
  Administered 2022-03-03: 25 ug/kg/min via INTRAVENOUS

## 2022-03-03 MED ORDER — PROPOFOL 10 MG/ML IV BOLUS
INTRAVENOUS | Status: DC | PRN
  Administered 2022-03-03: 20 mg via INTRAVENOUS
  Administered 2022-03-03: 50 mg via INTRAVENOUS

## 2022-03-03 MED ORDER — LACTATED RINGERS IV SOLN: INTRAVENOUS | Status: DC | PRN

## 2022-03-03 SURGICAL SUPPLY — 39 items
BLADE SURG 15 STRL LF DISP TIS (BLADE) ×1 IMPLANT
BLADE SURG 15 STRL SS (BLADE) ×1
BLADE SURG MINI STRL (BLADE) ×2 IMPLANT
BNDG COHESIVE 4X5 TAN ST LF (GAUZE/BANDAGES/DRESSINGS) ×1 IMPLANT
BNDG ELASTIC 4X5.8 VLCR STR LF (GAUZE/BANDAGES/DRESSINGS) ×1 IMPLANT
BNDG ESMARK 4X12 TAN STRL LF (GAUZE/BANDAGES/DRESSINGS) ×2 IMPLANT
BNDG GAUZE ELAST 4 BULKY (GAUZE/BANDAGES/DRESSINGS) ×3 IMPLANT
COVER LIGHT HANDLE STERIS (MISCELLANEOUS) ×4 IMPLANT
DRAPE FLUOR MINI C-ARM 54X84 (DRAPES) ×2 IMPLANT
DRSG PAD ABDOMINAL 8X10 ST (GAUZE/BANDAGES/DRESSINGS) ×1 IMPLANT
DURAPREP 26ML APPLICATOR (WOUND CARE) ×3 IMPLANT
ELECT REM PT RETURN 9FT ADLT (ELECTROSURGICAL) ×2
ELECTRODE REM PT RTRN 9FT ADLT (ELECTROSURGICAL) ×1 IMPLANT
GAUZE SPONGE 4X4 12PLY STRL (GAUZE/BANDAGES/DRESSINGS) IMPLANT
GAUZE XEROFORM 1X8 LF (GAUZE/BANDAGES/DRESSINGS) ×1 IMPLANT
GLOVE BIO SURGEON STRL SZ7 (GLOVE) ×2 IMPLANT
GLOVE SURG UNDER POLY LF SZ7.5 (GLOVE) ×2 IMPLANT
GOWN STRL REUS W/ TWL LRG LVL3 (GOWN DISPOSABLE) ×2 IMPLANT
GOWN STRL REUS W/ TWL XL LVL3 (GOWN DISPOSABLE) ×1 IMPLANT
GOWN STRL REUS W/TWL LRG LVL3 (GOWN DISPOSABLE) ×2
GOWN STRL REUS W/TWL XL LVL3 (GOWN DISPOSABLE) ×1
HANDPIECE INTERPULSE COAX TIP (DISPOSABLE)
HANDPIECE VERSAJET DEBRIDEMENT (MISCELLANEOUS) IMPLANT
IV NS 1000ML (IV SOLUTION) ×2
IV NS 1000ML BAXH (IV SOLUTION) ×1 IMPLANT
KIT BASIN OR (CUSTOM PROCEDURE TRAY) ×2 IMPLANT
KIT TURNOVER KIT A (KITS) IMPLANT
MANIFOLD NEPTUNE II (INSTRUMENTS) ×2 IMPLANT
NEEDLE HYPO 22GX1.5 SAFETY (NEEDLE) IMPLANT
NS IRRIG 1000ML POUR BTL (IV SOLUTION) ×2 IMPLANT
PACK DRAINAGE HVY KERLIX W/SPG (MISCELLANEOUS) ×1 IMPLANT
PACK EXTREMITY ARMC (MISCELLANEOUS) ×2 IMPLANT
PAD ARMBOARD 7.5X6 YLW CONV (MISCELLANEOUS) ×2 IMPLANT
SET HNDPC FAN SPRY TIP SCT (DISPOSABLE) ×1 IMPLANT
STAPLER SKIN PROX 35W (STAPLE) ×1 IMPLANT
SUT PROLENE 2 0 FS (SUTURE) ×1 IMPLANT
SUT PROLENE 3 0 PS 2 (SUTURE) ×3 IMPLANT
SWAB CULTURE AMIES ANAERIB BLU (MISCELLANEOUS) IMPLANT
SYR CONTROL 10ML LL (SYRINGE) IMPLANT

## 2022-03-03 NOTE — Progress Notes (Signed)
PT Cancellation Note ? ?Patient Details ?Name: Joel Dominguez ?MRN: [id number] ?DOB: 06/15/1951 ? ? ?Cancelled Treatment:    Reason Eval/Treat Not Completed: Patient not medically ready (Per RN/MD, pt scheduled to go to OR today. Will defer PT evaluation to later date/time once appropriate and once postoperative clearance is provided.) To participate in PT evaluation pt will also need a formal weightbearing order from the surgeon, as well as any required DME orders for mobility (postop shoe, etc).  ? ?10:06 AM, 03/03/22 ?Etta Grandchild, PT, DPT ?Physical Therapist - [place] ?[facility]  ?[phone] (ASCOM)  ? ? ? ?Dandria Griego C ?03/03/2022, 10:05 AM ?

## 2022-03-03 NOTE — Progress Notes (Signed)
PHARMACY NOTE:  ANTIMICROBIAL RENAL DOSAGE ADJUSTMENT ? ?Current antimicrobial regimen includes a mismatch between antimicrobial dosage and estimated renal function.  As per policy approved by the Pharmacy & Therapeutics and Medical Executive Committees, the antimicrobial dosage will be adjusted accordingly. ? ?Current antimicrobial dosage: Unasyn 1.5 g IV q6h ? ?Indication: OM & E faecalis bacteremia ? ?Renal Function: ? ?Estimated Creatinine Clearance: 69.4 mL/min (A) (by C-G formula based on SCr of 1.44 mg/dL (H)). ?   ?Antimicrobial dosage has been changed to:  Unasyn 3 g IV q6h ? ?Additional comments: Dose increased per renal function, indication, and BMI ? ? ?Thank you for allowing pharmacy to be a part of this patient's care. ? ?Benita Gutter, RPH ?03/03/2022 8:40 AM ? ? ?   ?

## 2022-03-03 NOTE — Interval H&P Note (Signed)
History and Physical Interval Note: ? ?03/03/2022 ?12:46 PM ? ?Joel Dominguez  has presented today for surgery, with the diagnosis of osteomylitis.  The various methods of treatment have been discussed with the patient and family. After consideration of risks, benefits and other options for treatment, the patient has consented to  Procedure(s): ?BILATERAL 2ND DIGIT AMPUTATION (Bilateral) as a surgical intervention.  The patient's history has been reviewed, patient examined, no change in status, stable for surgery.  I have reviewed the patient's chart and labs.  Questions were answered to the patient's satisfaction.   ? ? ?[name] ? ? ?

## 2022-03-03 NOTE — Progress Notes (Signed)
ID PROGRESS NOTE ? ?71yo M with spinal stenosis presents with severe back pain, LE weakness, fever with left great toe ulcer found to have osteomyelitis with e.faecalis bacteremia. ? ?Afebrile, underwent bilateral 2nd digit LE amputation by dr Posey Pronto today for source control for osteomyelitis. ? ?A/P: e.faecalis bacteremia 2/2 LE osteomyelitis POD#0 s/p bilateral 2nd digit amputation ? ?- can switch amp/sub to ampicillin for remainin course ?- still needs TEE to evaluate for endocarditis ?- will repeat blood cx today to see that he is clearing his bacteremia ?- dr Delaine Lame to see tomorrow. ? ?Elzie Rings Baxter Flattery MD MPH ?[facility] ?[phone] ? ?

## 2022-03-03 NOTE — Transfer of Care (Signed)
Immediate Anesthesia Transfer of Care Note ? ?Patient: Joel Dominguez ? ?Procedure(s) Performed: BILATERAL 2ND DIGIT AMPUTATION (Bilateral: Toe) ? ?Patient Location: PACU ? ?Anesthesia Type:General ? ?Level of Consciousness: drowsy and patient cooperative ? ?Airway & Oxygen Therapy: Patient Spontanous Breathing and Patient connected to face mask oxygen ? ?Post-op Assessment: Report given to RN and Post -op Vital signs reviewed and stable ? ?Post vital signs: Reviewed and stable ? ?Last Vitals:  ?Vitals Value Taken Time  ?BP 129/87 03/03/22 1334  ?Temp 36.8 ?C 03/03/22 1334  ?Pulse 92 03/03/22 1336  ?Resp 17 03/03/22 1336  ?SpO2 98 % 03/03/22 1336  ?Vitals shown include unvalidated device data. ? ?Last Pain:  ?Vitals:  ? 03/03/22 1334  ?TempSrc:   ?PainSc: Asleep  ?   ? ?Patients Stated Pain Goal: 0 (03/03/22 1000) ? ?Complications: No notable events documented. ?

## 2022-03-03 NOTE — Progress Notes (Signed)
Consulted for TEE procedure. ?Patient diagnosed with bacteremia. ? ?Shared Decision Making/Informed Consent ?The risks [esophageal damage, perforation (1:10,000 risk), bleeding, pharyngeal hematoma as well as other potential complications associated with conscious sedation including aspiration, arrhythmia, respiratory failure and death], benefits (treatment guidance and diagnostic support) and alternatives of a transesophageal echocardiogram were discussed in detail with Mr. Craine and he is willing to proceed.   ? ?Plan tentatively for TEE tomorrow. Npo after midnight. ? ?Signed, ?Kate Sable, M.D. ?03/03/22 ?[place] ?[phone]. ? ?

## 2022-03-03 NOTE — Progress Notes (Signed)
?PROGRESS NOTE ? ? ? ?Berkeley Vanaken  LPF:[id number] DOB: [birth year] DOA: 03/01/2022 ?PCP: Lorrene Reid, PA-C  ?Outpatient Specialists: podiatry ? ? ? ?Brief Narrative:  ? ?From admission h and p ?Talal Fritchman is a 71 y.o. male with medical history significant for chronic A-fib on anticoagulation with Eliquis, mild aortic stenosis, chronic combined systolic and diastolic dysfunction CHF with last known LVEF of 40 to 45% from a 2D echocardiogram which was done 04/23, spinal stenosis, right foot drop due to lumbar spinal stenosis, hypertension, morbid obesity (BMI 42) who presents to the ER for evaluation of 2 days of worsening low back pain. ?Patient has a history of chronic low back pain which got worse over the last 2 days. ?Per patient's wife who provides most of the history he was working in the yard 2 days prior to admission and had to bend over to pull some weeds.  He subsequently developed pain in his lower back and had tried over-the-counter pain medication as well as prescription meds without any relief.  He tried to get around the house with a rolling walker but over the last 12 hours has been unable to ambulate due to worsening pain that he rates 10 x 10 in intensity at its worst.  Any movement of his legs causes worsening of his back pain.  He denies having any urinary/fecal retention or incontinence and denies having any saddle anesthesia. ?He was recently discharged from the hospital and evaluated for sepsis from right leg cellulitis. ?He denies having any cough, no shortness of breath, no headache, no dizziness, no lightheadedness, no nausea, no vomiting, no changes in his bowel habits, no focal deficits or blurred vision. ?He had a fever upon arrival to the ER with a Tmax of 100.4 and is tachycardic. ? ? ?Assessment & Plan: ?  ?Principal Problem: ?  Sepsis ([place]) ?Active Problems: ?  Systemic inflammatory response syndrome (SIRS) ([facility]) ?  Acute exacerbation of chronic low back pain ?  Spinal  stenosis, lumbar region, with neurogenic claudication ?  Chronic combined systolic and diastolic CHF (congestive heart failure) ([place]) ?  Longstanding persistent atrial fibrillation ([place]) ?  Obesity, Class III, BMI 40-49.9 (morbid obesity) ([place]) ? ?# Sepsis ?2/2 bacteremia. By leukocytosis, fever. Fluid resuscitated, hemodynamically stable ?- stop IVF, is tolerating po ? ?# Enterococcus bacteremia ?In 3/4 blood cultures. Presumably from toe osteo. TTE neg for vegetations, ID advising TEE ?- f/u sensitivities ?- ampicillin per pharmacy ?- ID advising TEE, will discuss w/ cardiology ? ?# Osteomyelitis ?Of b/l second toes, now s/p amputation on 4/30, clean margins ?- PT/OT consults ?- WBAT in surgical shoe, keep dressing intact until 1 week podiatry f/u ? ?# AKI ?Mild, 2/2 sepsis ?- repeat BMP in AM ? ?# Low back pain ?MRI with multilevel ddd, no signs infection. Pain improved today ?- pain control ?- PT/OT ? ?# A fib ?Rate controlled ?- cont home coreg, apixaban ? ?# dCHF ?Appears compensated. Bp wnl ?- cont home coreg ?- hold home entresto, spiro ?- resume bumex when BP improves ?- stop farxiga, contraindicated given lower extremity infection ? ?# OSA ?- cpap qhs ? ?# morbid obesity ? ?DVT prophylaxis: lovenox ?Code Status: full ?Family Communication: wife updated @ bedside 4/30 ? ?Level of care: Med-Surg ?Status is: Inpatient ?Remains inpatient appropriate because: severity of illness ? ? ? ?Consultants:  ?podiatry ? ?Procedures: ?none ? ?Antimicrobials:  ?ampicillin  ? ? ?Subjective: ?Back pain improved. Tolerated surgery ? ?Objective: ?Vitals:  ? 03/03/22 1345 03/03/22 1400 03/03/22  1415 03/03/22 1447  ?BP: (!) 146/71 138/87 (!) 142/83 130/68  ?Pulse: 99 88 86 87  ?Resp: 19 (!) 24 (!) 22 20  ?Temp:   97.8 ?F (36.6 ?C) 98.1 ?F (36.7 ?C)  ?TempSrc:      ?SpO2: 100% 93% 94% 93%  ?Weight:      ?Height:      ? ? ?Intake/Output Summary (Last 24 hours) at 03/03/2022 1459 ?Last data filed at 03/03/2022 1328 ?Gross per 24  hour  ?Intake 1106.9 ml  ?Output 1700 ml  ?Net -593.1 ml  ? ?Filed Weights  ? 03/01/22 1215  ?Weight: (!) 140.6 kg  ? ? ?Examination: ? ?General exam: in pain. diaphoretic ?Respiratory system: Clear to auscultation. Respiratory effort normal. ?Cardiovascular system: S1 & S2 heard, RRR. No JVD, murmurs, rubs, gallops or clicks. 1+ le edema ?Gastrointestinal system: Abdomen is obese, soft and nontender. No organomegaly or masses felt. Normal bowel sounds heard. ?Central nervous system: Alert and oriented. No focal neurological deficits. ?Extremities: Symmetric 5 x 5 power. Surgical dressing b/l feet ?Psychiatry: Judgement and insight appear normal. Mood & affect appropriate.  ? ? ? ?Data Reviewed: I have personally reviewed following labs and imaging studies ? ?CBC: ?Recent Labs  ?Lab 03/01/22 ?1222 03/02/22 ?0639 03/03/22 ?4854  ?WBC 13.9* 10.8* 8.9  ?NEUTROABS 11.9*  --   --   ?HGB 13.4 12.3* 12.8*  ?HCT 43.4 37.9* 40.3  ?MCV 94.1 90.9 88.8  ?PLT 194 188 204  ? ?Basic Metabolic Panel: ?Recent Labs  ?Lab 03/01/22 ?1222 03/02/22 ?0639 03/02/22 ?1113  ?NA 134* 135 135  ?K 4.3 4.2 4.5  ?CL 102 100 101  ?CO2 '22 27 28  '$ ?GLUCOSE 125* 124* 121*  ?BUN 27* 24* 28*  ?CREATININE 1.23 1.31* 1.44*  ?CALCIUM 9.3 8.7* 8.9  ? ?GFR: ?Estimated Creatinine Clearance: 69.4 mL/min (A) (by C-G formula based on SCr of 1.44 mg/dL (H)). ?Liver Function Tests: ?Recent Labs  ?Lab 03/01/22 ?1222  ?AST 18  ?ALT 14  ?ALKPHOS 60  ?BILITOT 1.5*  ?PROT 7.9  ?ALBUMIN 3.6  ? ?No results for input(s): LIPASE, AMYLASE in the last 168 hours. ?No results for input(s): AMMONIA in the last 168 hours. ?Coagulation Profile: ?Recent Labs  ?Lab 03/02/22 ?6270  ?INR 1.6*  ? ?Cardiac Enzymes: ?No results for input(s): CKTOTAL, CKMB, CKMBINDEX, TROPONINI in the last 168 hours. ?BNP (last 3 results) ?Recent Labs  ?  10/10/21 ?1151  ?PROBNP 508*  ? ?HbA1C: ?No results for input(s): HGBA1C in the last 72 hours. ?CBG: ?Recent Labs  ?Lab 03/01/22 ?2033 03/02/22 ?0829  03/02/22 ?1119 03/02/22 ?1635  ?GLUCAP 123* 134* 111* 126*  ? ?Lipid Profile: ?No results for input(s): CHOL, HDL, LDLCALC, TRIG, CHOLHDL, LDLDIRECT in the last 72 hours. ?Thyroid Function Tests: ?No results for input(s): TSH, T4TOTAL, FREET4, T3FREE, THYROIDAB in the last 72 hours. ?Anemia Panel: ?No results for input(s): VITAMINB12, FOLATE, FERRITIN, TIBC, IRON, RETICCTPCT in the last 72 hours. ?Urine analysis: ?   ?Component Value Date/Time  ? COLORURINE YELLOW (A) 03/01/2022 1240  ? APPEARANCEUR HAZY (A) 03/01/2022 1240  ? APPEARANCEUR Clear 08/28/2018 1150  ? LABSPEC 1.019 03/01/2022 1240  ? PHURINE 5.0 03/01/2022 1240  ? GLUCOSEU NEGATIVE 03/01/2022 1240  ? [place] NEGATIVE 03/01/2022 1240  ? [place] NEGATIVE 03/01/2022 1240  ? BILIRUBINUR Negative 08/28/2018 1150  ? KETONESUR 5 (A) 03/01/2022 1240  ? PROTEINUR NEGATIVE 03/01/2022 1240  ? NITRITE NEGATIVE 03/01/2022 1240  ? LEUKOCYTESUR NEGATIVE 03/01/2022 1240  ? ?Sepsis Labs: ?'@LABRCNTIP'$ (procalcitonin:4,lacticidven:4) ? ?) ?Recent  Results (from the past 240 hour(s))  ?Culture, blood (routine x 2)     Status: Abnormal (Preliminary result)  ? Collection Time: 03/01/22 12:22 PM  ? Specimen: BLOOD  ?Result Value Ref Range Status  ? Specimen Description   Final  ?  BLOOD LEFT HAND ?Performed at [facility], [address] [place], [place] [address] ?  ? Special Requests   Final  ?  BOTTLES DRAWN AEROBIC AND ANAEROBIC Blood Culture results may not be optimal due to an inadequate volume of blood received in culture bottles ?Performed at [facility], [address], [place], [place] [address] ?  ? Culture  Setup Time   Final  ?  GRAM POSITIVE COCCI ?IN BOTH AEROBIC AND ANAEROBIC BOTTLES ?CRITICAL RESULT CALLED TO, READ BACK BY AND VERIFIED WITH: ?JASON ROBINS @ 0535 02/27/22 LFD ?Performed at [facility], [address] [place], [place] [address] ?  ? Culture ENTEROCOCCUS FAECALIS (A)  Final  ? Report Status PENDING  Incomplete   ?Culture, blood (routine x 2)     Status: Abnormal (Preliminary result)  ? Collection Time: 03/01/22  1:30 PM  ? Specimen: BLOOD  ?Result Value Ref Range Status  ? Specimen Description   Final  ?  BLOOD RIGHT ANT

## 2022-03-03 NOTE — Op Note (Signed)
Surgeon: Surgeon(s): ?[name], DPM  ?Assistants: None ?Pre-operative diagnosis: osteomylitis  ?Post-operative diagnosis: same ?Procedure: Procedure(s) (LRB): ?BILATERAL 2ND DIGIT AMPUTATION (Bilateral)  ?Pathology:  ?ID Type Source Tests Collected by Time Destination  ?1 : Left 2nd toe Tissue PATH Other SURGICAL PATHOLOGY [name], DPM 03/03/2022 1315   ?2 : Right 2nd toe Tissue PATH Other SURGICAL PATHOLOGY [name], DPM 03/03/2022 1316   ?  ?Pertinent Intra-op findings: Osteomyelitis noted to bilateral second digit.  Clean margins were clear ?Anesthesia: Choice  ?Hemostasis: None ?EBL: 50 cc ?Materials: 3-0 Prolene ?Injectables: 15 cc of one-to-one mixture of half percent Marcaine plain 1% lidocaine plain to both digits ?Complications: None ? ?Indications for surgery: ?A 71 y.o. male presents with bilateral second digit osteomyelitis. Patient has failed all conservative therapy including but not limited to local wound care and IV antibiotics. He wishes to have surgical correction of the foot/deformity. It was determined that patient would benefit from bilateral second digit amputation partial. ?Informed surgical risk consent was reviewed and read aloud to the patient.  I reviewed the films.  I have discussed my findings with the patient in great detail.  I have discussed all risks including but not limited to infection, stiffness, scarring, limp, disability, deformity, damage to blood vessels and nerves, numbness, poor healing, need for braces, arthritis, chronic pain, amputation, death.  All benefits and realistic expectations discussed in great detail.  I have made no promises as to the outcome.  I have provided realistic expectations.  I have offered the patient a 2nd opinion, which they have declined and assured me they preferred to proceed despite the risks ? ? ?Procedure in detail: ?The patient was both verbally and visually identified by myself, the nursing staff, and anesthesia staff in  the preoperative holding area. They were then transferred to the operating room and placed on the operative table in supine position. ? ?Attention was directed to bilateral second digit proximal interphalangeal joint, skin marker was used to delineate a fishmouth style incision to both joints.  #15 blade was used to carry the incision from epidermal dermal down to the level of the bone.  The digit was disarticulated at the interphalangeal joint.  A bone cutter was used to perform partial head of the proximal phalanx removal in standard technique.  No purulent drainage was noted.  The area appeared clear of infection.  Both of the digits were sent off in standard technique to pathology.  At this time it was determined the wound can be primarily closed.  Using normal saline solution the wound was thoroughly irrigated.  3-0 Prolene to close both of the wound in simple interrupted suture technique.  Xeroform 4 oh followed by 4 x 4 gauze Kerlix Ace bandage was used to wrap the foot.  All bony prominences were adequately padded. ? ?Disposition: ?Patient is okay to be discharged from podiatric standpoint.  Antibiotics per infectious disease.  Patient has achieved surgical cure via amputation of bilateral second digit.  No dressing change until follow-up follow-up 1 week from discharge.  Weightbearing as tolerated with surgical shoe ? ?At the conclusion of the procedure the patient was awoken from anesthesia and found to have tolerated the procedure well any complications. There were transferred to PACU with vital signs stable and vascular status intact. ? ?Boneta Lucks, DPM ? ?

## 2022-03-03 NOTE — Anesthesia Preprocedure Evaluation (Signed)
Anesthesia Evaluation  ?Patient identified by MRN, date of birth, ID band ?Patient awake ? ? ? ?Reviewed: ?Allergy & Precautions, H&P , NPO status , Patient's Chart, lab work & pertinent test results, reviewed documented beta blocker date and time  ? ?Airway ?Mallampati: III ? ?TM Distance: >3 FB ?Neck ROM: full ? ? ? Dental ?no notable dental hx. ?(+) Teeth Intact, Poor Dentition, Missing ?  ?Pulmonary ?sleep apnea and Continuous Positive Airway Pressure Ventilation ,  ?  ?Pulmonary exam normal ?breath sounds clear to auscultation ? ? ? ? ? ? Cardiovascular ?Exercise Tolerance: Poor ?hypertension, On Medications ?+ angina with exertion + CAD and +CHF  ?+ dysrhythmias Atrial Fibrillation  ?Rhythm:regular Rate:Normal ? ? ?  ?Neuro/Psych ? Neuromuscular disease CVA negative psych ROS  ? GI/Hepatic ?negative GI ROS, Neg liver ROS,   ?Endo/Other  ?diabetes, Well ControlledMorbid obesity ? Renal/GU ?  ? ?  ?Musculoskeletal ? ? Abdominal ?  ?Peds ? Hematology ? ?(+) Blood dyscrasia, anemia ,   ?Anesthesia Other Findings ? ? Reproductive/Obstetrics ?negative OB ROS ? ?  ? ? ? ? ? ? ? ? ? ? ? ? ? ?  ?  ? ? ? ? ? ? ? ? ?Anesthesia Physical ?Anesthesia Plan ? ?ASA: 4 and emergent ? ?Anesthesia Plan: MAC  ? ?Post-op Pain Management:   ? ?Induction:  ? ?PONV Risk Score and Plan: 2 ? ?Airway Management Planned:  ? ?Additional Equipment:  ? ?Intra-op Plan:  ? ?Post-operative Plan:  ? ?Informed Consent: I have reviewed the patients History and Physical, chart, labs and discussed the procedure including the risks, benefits and alternatives for the proposed anesthesia with the patient or authorized representative who has indicated his/her understanding and acceptance.  ? ? ? ? ? ?Plan Discussed with: CRNA ? ?Anesthesia Plan Comments:   ? ? ? ? ? ? ?Anesthesia Quick Evaluation ? ?

## 2022-03-03 NOTE — Progress Notes (Signed)
OT Cancellation Note ? ?Patient Details ?Name: Joel Dominguez ?MRN: [id number] ?DOB: 1951/02/18 ? ? ?Cancelled Treatment:    Reason Eval/Treat Not Completed: Patient not medically ready. [name] is scheduled for surgery today for amputation of L 2nd toe. Will sign off on OT order at present. Will need new OT order post-surgery, with weightbearing and DME instructions.  ? ?Josiah Lobo, PhD, MS, OTR/L ?03/03/22, 12:01 PM ? ?

## 2022-03-03 NOTE — Consult Note (Addendum)
?  Subjective:  ?Patient ID: Joel Dominguez, male    DOB: [birth year],  MRN: [id number] ? ?A 71 y.o. male with past medical history ofwith medical history significant for chronic A-fib on anticoagulation with Eliquis, mild aortic stenosis, chronic combined systolic and diastolic dysfunction CHF with last known LVEF of 40 to 45% from a 2D echocardiogram which was done 04/23, spinal stenosis, right foot drop due to lumbar spinal stenosis, hypertension, morbid obesity presents with bilateral second digit osteomyelitis.  Patient states this was being managed by Dr. Amalia Hailey in an outpatient setting.  The left side has started getting worse and was admitted to the hospital for IV antibiotics and possible amputation.  However given that he was already scheduled as an outpatient for bilateral second digit amputation we made the determination that he will benefit from the right side second digit amputation as well.  He denies any other acute complaints he would like to proceed with surgery. ? ?Objective:  ? ?Vitals:  ? 03/03/22 0630 03/03/22 0814  ?BP: (!) 150/77 121/84  ?Pulse: 95 92  ?Resp:  18  ?Temp: 97.9 ?F (36.6 ?C) 98.1 ?F (36.7 ?C)  ?SpO2: 94% 96%  ? ?General AA&O x3. Normal mood and affect.  ?Vascular Dorsalis pedis and posterior tibial pulses 2/4 bilat. ?Brisk capillary refill to all digits. Pedal hair present.  ?Neurologic Epicritic sensation grossly intact.  ?Dermatologic Bilateral second digit distal tip callus noted with ulceration probing down to bone.Purulent drainage noted.  No erythema noted.  ?Orthopedic: MMT 5/5 in dorsiflexion, plantarflexion, inversion, and eversion. ?Normal joint ROM without pain or crepitus.  ? ?Right foot: ?IMPRESSION: ?1. Subtle bone marrow edema within the distal phalanx of the right ?second toe. Findings could be secondary to bone contusion versus. ?Reactive osteitis or early osteomyelitis in the setting of localized ?infection. ? ?Left foot ?IMPRESSION: ?1. Acute osteomyelitis  involving the distal phalanx of the left ?second toe. ?2. Generalized soft tissue edema.  No organized fluid collection. ?3. Moderate degenerative changes of the forefoot most pronounced at ?the first MTP and third TMT joints. ?4. Small volume tenosynovial fluid at the knot of Henry within the ?plantar midfoot, nonspecific. ? ?Assessment & Plan:  ?Patient was evaluated and treated and all questions answered. ? ?Bilateral second digit osteomyelitis left greater than right side ?-All questions and concerns were discussed with the patient in extensive detail. ?-Given the presence of osteomyelitis and in the setting of already being scheduled as an outpatient amputation I believe patient will benefit from partial second digit amputation while as an inpatient.  I discussed with the patient and his wife in extensive detail the they both understand would like to have both of the digit amputated ?-I made the determination that patient would benefit from bilateral second digit amputation today. ?-N.p.o. confirmed after midnight. ?-He can be weightbearing as tolerated in surgical shoe bilaterally ?-Antibiotics per infectious disease ?-No dressing change until follow-up. ?-Await the final read of arterial duplex ultrasound.  If within normal limits will not need vascular consultation. ? ? ?[name], DPM ? ?Accessible via secure chat for questions or concerns.  ?

## 2022-03-04 ENCOUNTER — Encounter: Payer: Self-pay | Admitting: Cardiology

## 2022-03-04 ENCOUNTER — Telehealth: Payer: Self-pay

## 2022-03-04 DIAGNOSIS — B952 Enterococcus as the cause of diseases classified elsewhere: Secondary | ICD-10-CM

## 2022-03-04 DIAGNOSIS — R6520 Severe sepsis without septic shock: Secondary | ICD-10-CM

## 2022-03-04 DIAGNOSIS — A419 Sepsis, unspecified organism: Secondary | ICD-10-CM

## 2022-03-04 DIAGNOSIS — M545 Low back pain, unspecified: Secondary | ICD-10-CM | POA: Diagnosis not present

## 2022-03-04 DIAGNOSIS — M86179 Other acute osteomyelitis, unspecified ankle and foot: Secondary | ICD-10-CM

## 2022-03-04 DIAGNOSIS — R7881 Bacteremia: Secondary | ICD-10-CM | POA: Diagnosis not present

## 2022-03-04 DIAGNOSIS — I4811 Longstanding persistent atrial fibrillation: Secondary | ICD-10-CM | POA: Diagnosis not present

## 2022-03-04 LAB — BASIC METABOLIC PANEL
Anion gap: 7 (ref 5–15)
BUN: 22 mg/dL (ref 8–23)
CO2: 24 mmol/L (ref 22–32)
Calcium: 8.3 mg/dL — ABNORMAL LOW (ref 8.9–10.3)
Chloride: 105 mmol/L (ref 98–111)
Creatinine, Ser: 0.92 mg/dL (ref 0.61–1.24)
GFR, Estimated: >60 mL/min (ref >60)
Glucose, Bld: 123 mg/dL — ABNORMAL HIGH (ref 70–99)
Potassium: 3.9 mmol/L (ref 3.5–5.1)
Sodium: 136 mmol/L (ref 135–145)

## 2022-03-04 LAB — CULTURE, BLOOD (ROUTINE X 2)

## 2022-03-04 MED ORDER — MORPHINE SULFATE (PF) 4 MG/ML IV SOLN
3.0000 mg | INTRAVENOUS | Status: DC | PRN
  Administered 2022-03-04 – 2022-03-05 (×5): 3 mg via INTRAVENOUS
  Filled 2022-03-04 (×5): qty 1

## 2022-03-04 MED ORDER — SENNA 8.6 MG PO TABS
1.0000 | ORAL_TABLET | Freq: Every day | ORAL | Status: DC
  Administered 2022-03-04 – 2022-03-07 (×4): 8.6 mg via ORAL
  Filled 2022-03-04 (×4): qty 1

## 2022-03-04 NOTE — Progress Notes (Signed)
?PROGRESS NOTE ? ? ? ?Joel Dominguez  BJY:[id number] DOB: 08-21-1951 DOA: 03/01/2022 ?PCP: Lorrene Reid, PA-C  ?Outpatient Specialists: podiatry ? ? ? ?Brief Narrative:  ? ?From admission h and p ?Joel Dominguez is a 71 y.o. male with medical history significant for chronic A-fib on anticoagulation with Eliquis, mild aortic stenosis, chronic combined systolic and diastolic dysfunction CHF with last known LVEF of 40 to 45% from a 2D echocardiogram which was done 04/23, spinal stenosis, right foot drop due to lumbar spinal stenosis, hypertension, morbid obesity (BMI 42) who presents to the ER for evaluation of 2 days of worsening low back pain. ?Patient has a history of chronic low back pain which got worse over the last 2 days. ?Per patient's wife who provides most of the history he was working in the yard 2 days prior to admission and had to bend over to pull some weeds.  He subsequently developed pain in his lower back and had tried over-the-counter pain medication as well as prescription meds without any relief.  He tried to get around the house with a rolling walker but over the last 12 hours has been unable to ambulate due to worsening pain that he rates 10 x 10 in intensity at its worst.  Any movement of his legs causes worsening of his back pain.  He denies having any urinary/fecal retention or incontinence and denies having any saddle anesthesia. ?He was recently discharged from the hospital and evaluated for sepsis from right leg cellulitis. ?He denies having any cough, no shortness of breath, no headache, no dizziness, no lightheadedness, no nausea, no vomiting, no changes in his bowel habits, no focal deficits or blurred vision. ?He had a fever upon arrival to the ER with a Tmax of 100.4 and is tachycardic. ? ? ?Assessment & Plan: ?  ?Principal Problem: ?  Sepsis ([place]) ?Active Problems: ?  Systemic inflammatory response syndrome (SIRS) (HCC) ?  Acute exacerbation of chronic low back pain ?  Spinal  stenosis, lumbar region, with neurogenic claudication ?  Chronic combined systolic and diastolic CHF (congestive heart failure) ([place]) ?  Longstanding persistent atrial fibrillation ([place]) ?  Obesity, Class III, BMI 40-49.9 (morbid obesity) ([place]) ? ?# Sepsis ?2/2 bacteremia. By leukocytosis, fever. Fluid resuscitated, hemodynamically stable ? ?# Enterococcus bacteremia ?In 3/4 blood cultures. Presumably from toe osteo. TTE neg for vegetations, ID advising TEE. Repeat blood cultures from 4/30 ngtd. Pan-sensitive ?- ampicillin per pharmacy ?- ID advising TEE, on schedule for that tomorrow ? ?# Osteomyelitis ?Of b/l second toes, now s/p amputation on 4/30, clean margins ?- PT/OT consults, have seen today, recs pending ?- WBAT in surgical shoe, keep dressing intact until 1 week podiatry f/u ? ?# AKI ?Mild, 2/2 sepsis. resolved ? ?# Low back pain ?MRI with multilevel ddd, no signs infection. Still with sig pain with movement ?- pain control, increase morphine dose today ?- PT/OT ? ?# A fib ?Rate controlled ?- cont home coreg, apixaban ? ?# dCHF ?Appears compensated. Bp wnl ?- cont home coreg ?- hold home entresto, spiro ?- resume bumex at some point ?- stop farxiga, contraindicated given lower extremity infection ? ?# OSA ?- cpap qhs ? ?# morbid obesity ? ?DVT prophylaxis: lovenox ?Code Status: full ?Family Communication: wife updated @ bedside 5/1 ? ?Level of care: Med-Surg ?Status is: Inpatient ?Remains inpatient appropriate because: severity of illness ? ? ? ?Consultants:  ?podiatry ? ?Procedures: ?none ? ?Antimicrobials:  ?ampicillin  ? ? ?Subjective: ?Back pain with PT today. Tolerating diet. ? ?  Objective: ?Vitals:  ? 03/03/22 1447 03/03/22 2002 03/04/22 3419 03/04/22 0805  ?BP: 130/68 138/78 (!) 169/88 (!) 145/67  ?Pulse: 87 99 (!) 101 93  ?Resp: '20 20  16  '$ ?Temp: 98.1 ?F (36.7 ?C) 99.4 ?F (37.4 ?C) 98.2 ?F (36.8 ?C) 98.1 ?F (36.7 ?C)  ?TempSrc:  Oral Oral Oral  ?SpO2: 93% 94% 97% 92%  ?Weight:      ?Height:       ? ? ?Intake/Output Summary (Last 24 hours) at 03/04/2022 1241 ?Last data filed at 03/04/2022 1009 ?Gross per 24 hour  ?Intake 2907.69 ml  ?Output 500 ml  ?Net 2407.69 ml  ? ?Filed Weights  ? 03/01/22 1215  ?Weight: (!) 140.6 kg  ? ? ?Examination: ? ?General exam: in pain. diaphoretic ?Respiratory system: Clear to auscultation. Respiratory effort normal. ?Cardiovascular system: S1 & S2 heard, RRR. No JVD, murmurs, rubs, gallops or clicks. 1+ le edema ?Gastrointestinal system: Abdomen is obese, soft and nontender. No organomegaly or masses felt. Normal bowel sounds heard. ?Central nervous system: Alert and oriented. No focal neurological deficits. ?Extremities: Symmetric 5 x 5 power. Surgical dressing b/l feet ?Psychiatry: Judgement and insight appear normal. Mood & affect appropriate.  ? ? ? ?Data Reviewed: I have personally reviewed following labs and imaging studies ? ?CBC: ?Recent Labs  ?Lab 03/01/22 ?1222 03/02/22 ?0639 03/03/22 ?3790  ?WBC 13.9* 10.8* 8.9  ?NEUTROABS 11.9*  --   --   ?HGB 13.4 12.3* 12.8*  ?HCT 43.4 37.9* 40.3  ?MCV 94.1 90.9 88.8  ?PLT 194 188 204  ? ?Basic Metabolic Panel: ?Recent Labs  ?Lab 03/01/22 ?1222 03/02/22 ?0639 03/02/22 ?1113 03/04/22 ?2409  ?NA 134* 135 135 136  ?K 4.3 4.2 4.5 3.9  ?CL 102 100 101 105  ?CO2 '22 27 28 24  '$ ?GLUCOSE 125* 124* 121* 123*  ?BUN 27* 24* 28* 22  ?CREATININE 1.23 1.31* 1.44* 0.92  ?CALCIUM 9.3 8.7* 8.9 8.3*  ? ?GFR: ?Estimated Creatinine Clearance: 108.6 mL/min (by C-G formula based on SCr of 0.92 mg/dL). ?Liver Function Tests: ?Recent Labs  ?Lab 03/01/22 ?1222  ?AST 18  ?ALT 14  ?ALKPHOS 60  ?BILITOT 1.5*  ?PROT 7.9  ?ALBUMIN 3.6  ? ?No results for input(s): LIPASE, AMYLASE in the last 168 hours. ?No results for input(s): AMMONIA in the last 168 hours. ?Coagulation Profile: ?Recent Labs  ?Lab 03/02/22 ?7353  ?INR 1.6*  ? ?Cardiac Enzymes: ?No results for input(s): CKTOTAL, CKMB, CKMBINDEX, TROPONINI in the last 168 hours. ?BNP (last 3 results) ?Recent Labs  ?   10/10/21 ?1151  ?PROBNP 508*  ? ?HbA1C: ?No results for input(s): HGBA1C in the last 72 hours. ?CBG: ?Recent Labs  ?Lab 03/01/22 ?2033 03/02/22 ?0829 03/02/22 ?1119 03/02/22 ?1635  ?GLUCAP 123* 134* 111* 126*  ? ?Lipid Profile: ?No results for input(s): CHOL, HDL, LDLCALC, TRIG, CHOLHDL, LDLDIRECT in the last 72 hours. ?Thyroid Function Tests: ?No results for input(s): TSH, T4TOTAL, FREET4, T3FREE, THYROIDAB in the last 72 hours. ?Anemia Panel: ?No results for input(s): VITAMINB12, FOLATE, FERRITIN, TIBC, IRON, RETICCTPCT in the last 72 hours. ?Urine analysis: ?   ?Component Value Date/Time  ? COLORURINE YELLOW (A) 03/01/2022 1240  ? APPEARANCEUR HAZY (A) 03/01/2022 1240  ? APPEARANCEUR Clear 08/28/2018 1150  ? LABSPEC 1.019 03/01/2022 1240  ? PHURINE 5.0 03/01/2022 1240  ? GLUCOSEU NEGATIVE 03/01/2022 1240  ? [place] NEGATIVE 03/01/2022 1240  ? [place] NEGATIVE 03/01/2022 1240  ? BILIRUBINUR Negative 08/28/2018 1150  ? KETONESUR 5 (A) 03/01/2022 1240  ? PROTEINUR NEGATIVE  03/01/2022 1240  ? NITRITE NEGATIVE 03/01/2022 1240  ? LEUKOCYTESUR NEGATIVE 03/01/2022 1240  ? ?Sepsis Labs: ?'@LABRCNTIP'$ (procalcitonin:4,lacticidven:4) ? ?) ?Recent Results (from the past 240 hour(s))  ?Culture, blood (routine x 2)     Status: Abnormal  ? Collection Time: 03/01/22 12:22 PM  ? Specimen: BLOOD  ?Result Value Ref Range Status  ? Specimen Description   Final  ?  BLOOD LEFT HAND ?Performed at [facility], [address], [place], [place] [address] ?  ? Special Requests   Final  ?  BOTTLES DRAWN AEROBIC AND ANAEROBIC Blood Culture results may not be optimal due to an inadequate volume of blood received in culture bottles ?Performed at [facility], [address], [place], [place] [address] ?  ? Culture  Setup Time   Final  ?  GRAM POSITIVE COCCI ?IN BOTH AEROBIC AND ANAEROBIC BOTTLES ?CRITICAL RESULT CALLED TO, READ BACK BY AND VERIFIED WITH: ?JASON ROBINS @ 0535 02/27/22 LFD ?Performed at [facility],  [address], [place], [place] [address] ?  ? Culture (A)  Final  ?  ENTEROCOCCUS FAECALIS ?SUSCEPTIBILITIES PERFORMED ON PREVIOUS CULTURE WITHIN THE LAST 5 DAYS. ?Performed at [facility], [place]

## 2022-03-04 NOTE — TOC Initial Note (Signed)
Transition of Care (TOC) - Initial/Assessment Note  ? ? ?Patient Details  ?Name: Joel Dominguez ?MRN: [id number] ?Date of Birth: [birth year] ? ?Transition of Care (TOC) CM/SW Contact:    ?Candie Chroman, LCSW ?Phone Number: ?03/04/2022, 3:01 PM ? ?Clinical Narrative:   CSW met with patient. Wife at bedside. CSW introduced role and explained that therapy recommendations would be discussed. Patient said he is going to work with therapy tomorrow after receiving pain medication and he's hoping he'll be able to do well enough to go home. He is agreeable to CSW sending out SNF referral. Gave CMS scores for facilities within 25 miles of his zip code. No further concerns. CSW encouraged patient and his wife to contact CSW as needed. CSW will continue to follow patient and his wife for support and facilitate discharge once medically stable.              ? ?Expected Discharge Plan: [place] ?Barriers to Discharge: Continued Medical Work up ? ? ?Patient Goals and CMS Choice ?Patient states their goals for this hospitalization and ongoing recovery are:: To go home ?CMS Medicare.gov Compare Post Acute Care list provided to:: Patient ?  ? ?Expected Discharge Plan and Services ?Expected Discharge Plan: [place] ?  ?  ?Post Acute Care Choice: [facility] (vs [facility]) ?Living arrangements for the past 2 months: [place] ?                ?  ?  ?  ?  ?  ?  ?  ?  ?  ?  ? ?Prior Living Arrangements/Services ?Living arrangements for the past 2 months: [place] ?Lives with:: Spouse ?Patient language and need for interpreter reviewed:: Yes ?Do you feel safe going back to the place where you live?: Yes      ?Need for Family Participation in Patient Care: Yes (Comment) ?Care giver support system in place?: Yes (comment) ?  ?Criminal Activity/Legal Involvement Pertinent to Current Situation/Hospitalization: No - Comment as needed ? ?Activities of Daily Living ?Home Assistive  Devices/Equipment: Cane (specify quad or straight) ?ADL Screening (condition at time of admission) ?Patient's cognitive ability adequate to safely complete daily activities?: Yes ?Is the patient deaf or have difficulty hearing?: No ?Does the patient have difficulty seeing, even when wearing glasses/contacts?: Yes ?Does the patient have difficulty concentrating, remembering, or making decisions?: No ?Patient able to express need for assistance with ADLs?: Yes ?Does the patient have difficulty dressing or bathing?: No ?Independently performs ADLs?: Yes (appropriate for developmental age) ?Does the patient have difficulty walking or climbing stairs?: Yes ?Weakness of Legs: Both ?Weakness of Arms/Hands: None ? ?Permission Sought/Granted ?Permission sought to share information with : Facility Sport and exercise psychologist, Family Supports ?Permission granted to share information with : Yes, Verbal Permission Granted ? Share Information with NAME: Alyas Creary ? Permission granted to share info w AGENCY: SNF's ? Permission granted to share info w Relationship: Wife ? Permission granted to share info w Contact Information: [phone] ? ?Emotional Assessment ?Appearance:: Appears stated age ?Attitude/Demeanor/Rapport: Engaged, Gracious ?Affect (typically observed): Accepting, Appropriate, Calm, Pleasant ?Orientation: : Oriented to Self, Oriented to Place, Oriented to  Time, Oriented to Situation ?Alcohol / Substance Use: Not Applicable ?Psych Involvement: No (comment) ? ?Admission diagnosis:  Toe osteomyelitis, left ([place]) [M86.9] ?Sepsis ([place]) [A41.9] ?Acute bilateral low back pain without sciatica [M54.50] ?Sepsis without acute organ dysfunction, due to unspecified organism [facility]) [A41.9] ?Patient Active Problem List  ? Diagnosis Date Noted  ?  Sepsis ([place]) 03/01/2022  ? Systemic inflammatory response syndrome (SIRS) ([place]) 03/01/2022  ? Hypertension   ? Heart disease   ? CHF (congestive heart failure) ([place])   ? Long term  current use of diuretic 12/01/2019  ? Anemia 12/01/2019  ? Heme positive stool 11/17/2019  ? Physical debility 11/11/2019  ? Acute blood loss anemia 11/05/2019  ? Cellulitis 11/2019  ? Chronic congestive heart failure ([place])   ? Longstanding persistent atrial fibrillation ([place])   ? Chronic foot ulcer ([place]) 08/08/2019  ? Prediabetes 08/08/2019  ? Left leg weakness 08/08/2019  ? High risk medication use 08/08/2019  ? Chronic ulcer of toe ([place]) 08/04/2019  ? Pain due to onychomycosis of toenails of both feet 04/30/2019  ? Coagulation disorder ([place]) 04/30/2019  ? Acute exacerbation of chronic low back pain 03/03/2018  ? Lumbosacral radiculopathy 01/22/2018  ? Osteoarthritis 11/19/2017  ? Vitamin D deficiency 11/19/2017  ? Positive for microalbuminuria 11/19/2017  ? Hypertensive heart disease with heart failure ([place]) 11/10/2017  ? 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 fascicular block) 11/07/2017  ? Aortic stenosis 11/06/2017  ? H/O noncompliance with medical treatment, presenting hazards to health 10/22/2017  ? h/o Stroke 10/08/2016  ? Atherosclerotic heart disease of native coronary artery with unspecified angina pectoris ([place]) 10/08/2016  ? Sleep apnea with use of continuous positive airway pressure (CPAP) 10/08/2016  ? Chronic combined systolic and diastolic CHF (congestive heart failure) ([place]) 10/08/2016  ? Generalized OA 10/08/2016  ? Foot drop- R 10/08/2016  ? Lumbar spinal stenosis 10/08/2016  ? Pulmonary hypertension ([place]) 10/08/2016  ? Chronic atrial fibrillation ([place]) 10/08/2016  ? Chronic anticoagulation 10/08/2016  ? Obesity, Class III, BMI 40-49.9 (morbid obesity) ([place]) 10/08/2016  ? Spinal stenosis, lumbar region, with neurogenic claudication 10/08/2016  ? OSA (obstructive sleep apnea) 10/08/2016  ? A-fib 10/08/2016  ? ?PCP:  Lorrene Reid, PA-C ?Pharmacy:   ?[id number] - [place], [address] ?[address] ?[place] [address] ?Phone: [phone] Fax:  [phone] ? ?[place], [address] ?[address] ?[address] ?[place] [address] ?Phone: [phone] Fax: [phone] ? ? ? ? ?Social Determinants of Health (SDOH) Interventions ?  ? ?Readmission Risk Interventions ?   ? View : No data to display.  ?  ?  ?  ? ? ? ?

## 2022-03-04 NOTE — Telephone Encounter (Signed)
Joel Dominguez wife called to cancel his surgery with Dr. Amalia Hailey on 04/11/2022. She stated he was admitted to [facility] and Dr. Posey Pronto amputated the 2 toes. Notified Dr. Amalia Hailey and Caren Griffins with [facility] ?

## 2022-03-04 NOTE — Consult Note (Signed)
NAME: Joel Dominguez  ?DOB: [birth year]  ?MRN: [id number]  ?Date/Time: 03/04/2022 8:54 AM ? ?REQUESTING PROVIDER: Dr. Si Raider ?Subjective:  ?REASON FOR CONSULT: Enterococcus bacteremia ??Spoke to patient, wife . Chart reviewed in detail ?Joel Dominguez is a 71 y.o. male with a history of chronic atrial fibrillation on anticoagulation with Eliquis, CHF, severe lumbar stenosis with right foot drop, hypertension, morbid obesity bilateral knee and replacement presented to the ED on 03/01/2022 with 2 days of worsening low back pain   As per patient and his wife he was working in the yard and bent forward to pull some weeds and then went for dinner and sat for a long period of time.  He then started having pain in the lower back.  Patient has severe lumbar spinal stenosis with right foot drop.  His mobility is limited.  He does not have any urinary or fecal retention or incontinence.  No numbness in the perineum.  Patient has been followed by podiatrist for second toe ulcers on both feet.   ? ? ? ? ?He was on doxycycline recently. ?He was hospitalized at [facility] April 4 fifth and sixth for right leg cellulitis. ?In the ED BP 160/76, temperature 100.2, pulse 93, sats 94%.  WBC 13.9, Hb 13.4, platelet 194 and creatinine 1.23. ?Blood culture was sent.  Patient had MRI of the spine that showed no abnormal marrow disc signal or enhancement to suggest discitis or osteomyelitis.  There was L2-L3 moderate to severe spinal canal stenosis with effacement of the lateral recesses which likely compresses the descending L3 nerve roots.  There was increased T2 signal enhancement of the paraspinous musculature left greater than right secondary to muscular inflammation.  Patient was started on broad-spectrum antibiotics.  His blood culture came back positive for Enterococcus faecalis antibiotic was narrowed down to Unasyn.  MRI of the left foot showed acute osteomyelitis involving the distal phalanx of the left second toe.  MRI of the right  foot showed possible osteomyelitis of the second toe as well.  Patient also was seen by podiatrist and underwent amputation of the second toes bilaterally. ?Repeat blood cultures have been sent ?Plan is to have a TEE tomorrow. ?I am seeing the patient for Enterococcus bacteremia. ?Patient has bilateral knee replacement but no pain and swelling in the knees. ?He has some shortness of breath ?He is morbidly obese and had lost 100 pounds after following weight watchers but gained back 30 pounds. ? ? ? ?Past Medical History:  ?Diagnosis Date  ? A-fib 10/08/2016  ? Aortic stenosis 11/06/2017  ? Atherosclerotic heart disease of native coronary artery with unspecified angina pectoris ([place]) 10/08/2016  ? Cellulitis 11/2019  ? right lower extremity  ? CHF (congestive heart failure) ([place])   ? Chronic anticoagulation 10/08/2016  ? Chronic combined systolic and diastolic heart failure ([employer]) 10/08/2016  ? EF range 35-45% in 2016  ? Chronic congestive heart failure ([place])   ? Chronic ulcer of toe ([place]) 08/04/2019  ? Coagulation disorder ([place]) 04/30/2019  ? Foot drop- R 10/08/2016  ? Patient known foot drop for many years due to lumbar spinal stenosis.-Over the past couple of months symptoms have gotten worse.  He feels he is tripping more and cannot lift his foot up as much. Toes are curling and calluses on end of toes.    - Did see spinal surgeon many yrs ago.  Declined sx at that time.     ? Generalized OA 10/08/2016  ? Ortho doc-  Dr. [facility]  in [place]- both knees replaced 08, 09.     ? H/O noncompliance with medical treatment, presenting hazards to health 10/22/2017  ? Heart disease   ? Hypertension   ? Hypertensive heart disease with heart failure ([place]) 11/10/2017  ? Longstanding persistent atrial fibrillation ([place])   ? Obesity, Class III, BMI 40-49.9 (morbid obesity) ([place]) 10/08/2016  ? OSA (obstructive sleep apnea) 10/08/2016  ? Bi-PAP nitely- been 20+ yrs now  ? Osteoarthritis 11/19/2017  ? Pulmonary hypertension ([place]) 10/08/2016  ?  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 fascicular block) 11/07/2017  ? Sleep apnea with use of continuous positive airway pressure (CPAP) 10/08/2016  ? Bi-PAP nitely- been 20+ yrs now  ? Spinal stenosis, lumbar region, with neurogenic claudication 10/08/2016  ? Back specialist in [place]-    only surgeon he would use- pt declined referral here for back pain mgt    ? Stroke [facility])   ?  ?Past Surgical History:  ?Procedure Laterality Date  ? AMPUTATION TOE Bilateral 03/03/2022  ? Procedure: BILATERAL 2ND DIGIT AMPUTATION;  Surgeon: [name], DPM;  Location: [facility];  Service: Podiatry;  Laterality: Bilateral;  ? HAMMER TOE SURGERY Right 09/28/2021  ? Middle toe  ? I & D EXTREMITY Right 10/30/2019  ? Procedure: IRRIGATION AND DEBRIDEMENT EXTREMITY, right leg possible wound vac placement;  Surgeon: Marchia Bond, MD;  Location: [facility];  Service: Orthopedics;  Laterality: Right;  ? REPLACEMENT TOTAL KNEE Bilateral   ? TIBIA FRACTURE SURGERY    ? VITRECTOMY PARS PLANA 23 GAUGE FOR ENDOPHTHALMITIS Left 03/04/2020  ? Procedure: VITRECTOMY PARS PLANA 23 GAUGE FOR ENDOPHTHALMITIS;  Surgeon: Jalene Mullet, MD;  Location: [facility];  Service: Ophthalmology;  Laterality: Left;  ?  ?Social History  ? ?Socioeconomic History  ? Marital status: Married  ?  Spouse name: Not on file  ? Number of children: Not on file  ? Years of education: Not on file  ? Highest education level: Not on file  ?Occupational History  ? Not on file  ?Tobacco Use  ? Smoking status: Never  ?  Passive exposure: Never  ? Smokeless tobacco: Never  ?Vaping Use  ? Vaping Use: Never used  ?Substance and Sexual Activity  ? Alcohol use: Not Currently  ? Drug use: No  ? Sexual activity: Not on file  ?Other Topics Concern  ? Not on file  ?Social History Narrative  ? Not on file  ? ?Social Determinants of Health  ? ?Financial Resource Strain: Not on file  ?Food Insecurity: Not on file  ?Transportation Needs: Not on file  ?Physical Activity: Not on file  ?Stress:  Not on file  ?Social Connections: Not on file  ?Intimate Partner Violence: Not on file  ?  ?Family History  ?Problem Relation Age of Onset  ? Congestive Heart Failure Mother   ? Hypertension Mother   ? Diabetes Mother   ? Cancer Father   ?     lung  ? Congestive Heart Failure Sister   ? Hypertension Sister   ? Hypertension Brother   ? Hypertension Sister   ? Diabetes Brother   ? ?Allergies  ?Allergen Reactions  ? Cephalexin Rash  ? Dilaudid [Hydromorphone] Other (See Comments)  ?  Hallucinations  ? ?I? ?Current Facility-Administered Medications  ?Medication Dose Route Frequency Provider Last Rate Last Admin  ? 0.9 %  sodium chloride infusion  250 mL Intravenous PRN [name], DPM      ? 0.9 %  sodium  chloride infusion   Intravenous Continuous Kate Sable, MD 20 mL/hr at 03/03/22 2106 New Bag at 03/03/22 2106  ? acetaminophen (TYLENOL) tablet 650 mg  650 mg Oral Q6H PRN [name], DPM   650 mg at 03/03/22 2107  ? Or  ? acetaminophen (TYLENOL) suppository 650 mg  650 mg Rectal Q6H PRN [name], DPM      ? ampicillin (OMNIPEN) 2 g in sodium chloride 0.9 % 100 mL IVPB  2 g Intravenous Q4H Gwynne Edinger, MD 300 mL/hr at 03/04/22 0850 2 g at 03/04/22 0850  ? apixaban (ELIQUIS) tablet 5 mg  5 mg Oral BID [name], DPM   5 mg at 03/04/22 1103  ? B-complex with vitamin C tablet 1 tablet  1 tablet Oral Daily [name], DPM   1 tablet at 03/04/22 1594  ? carvedilol (COREG) tablet 12.5 mg  12.5 mg Oral BID WC [name], DPM   12.5 mg at 03/04/22 0850  ? cyclobenzaprine (FLEXERIL) tablet 10 mg  10 mg Oral TID PRN [name], DPM   10 mg at 03/03/22 0441  ? HYDROcodone-acetaminophen (NORCO) 10-325 MG per tablet 1 tablet  1 tablet Oral Q8H PRN [name], DPM   1 tablet at 03/04/22 0302  ? morphine (PF) 2 MG/ML injection 2 mg  2 mg Intravenous Q4H PRN [name], DPM   2 mg at 03/04/22 0017  ? multivitamin with minerals tablet 1 tablet  1 tablet Oral Daily [name], DPM    1 tablet at 03/04/22 5859  ? ondansetron (ZOFRAN) tablet 4 mg  4 mg Oral Q6H PRN [name], DPM      ? Or  ? ondansetron (ZOFRAN) injection 4 mg  4 mg Intravenous Q6H PRN [name], DPM

## 2022-03-04 NOTE — Evaluation (Signed)
Occupational Therapy Evaluation ?Patient Details ?Name: Joel Dominguez ?MRN: [id number] ?DOB: 1951/07/02 ?Today's Date: 03/04/2022 ? ? ?History of Present Illness Joel Dominguez is a 19yoM who comes to [facility] on 4/28 c acute on chronic back pain. PMH: AF on eliquis, aortic stenosis, combined CHF, spinal stenosis, Rt foot drop 2/2 spondylosis, HTN. Pt admitted with SIRS c fever, tachycardia, and leukocytosis. Imaging of spine did not show any discitis or osteo but has some paraspinous muscular inflammation. MRI of left foot did show acute osteo to distal phalanx of left second toe. Infectious work up revealed + blood cx for e.faecalis. Pt taken to OR 4/30 c Dr. Posey Pronto podiatry for bilat 2nd digit partial ray resection. Postoperatively pt is WBAT in postop shoes bilat.  ? ?Clinical Impression ?  ?Patient presenting with decreased Ind in self care,balance, functional mobility/transfers, endurance, and safety awareness.  Patient reports living at home with wife. Pt ambulates short distances with SPC and use of power scooter for shorter distances. Pt reports being able to independently perform self care tasks other than assistance to don compression socks. Patient found seated on EOB and diaphoretic with reports of back spasms. Pt unable to stand from elevated surface. Pt needing Min - mod +2 assistance for lateral scoots and eventually squat pivots towards HOB for positioning. Bed was placed into trendelenburg with pt scooting downward in bed. +2 assistance to return to supine with pt calling out in pain. RN notified. Pt is very pain limited this session from lower back. Pt does not report any B foot pain and is unable to tell that therapist removed post op shoes once returning to bed. Patient will benefit from acute OT to increase overall independence in the areas of ADLs, functional mobility, and safety awareness in order to safely discharge to next venue of care.  ?   ? ?Recommendations for follow up therapy are one component  of a multi-disciplinary discharge planning process, led by the attending physician.  Recommendations may be updated based on patient status, additional functional criteria and insurance authorization.  ? ?Follow Up Recommendations ? Skilled nursing-short term rehab (<3 hours/day)  ?  ?Assistance Recommended at Discharge Frequent or constant Supervision/Assistance  ?Patient can return home with the following A lot of help with walking and/or transfers;A lot of help with bathing/dressing/bathroom;Assistance with cooking/housework;Help with stairs or ramp for entrance;Assist for transportation ? ?  ?Functional Status Assessment ? Patient has had a recent decline in their functional status and demonstrates the ability to make significant improvements in function in a reasonable and predictable amount of time.  ?Equipment Recommendations ? Other (comment) (defer to next venue of care)  ?  ?   ?Precautions / Restrictions Precautions ?Precautions: Fall ?Restrictions ?Weight Bearing Restrictions: Yes ?RLE Weight Bearing: Weight bearing as tolerated ?LLE Weight Bearing: Weight bearing as tolerated ?Other Position/Activity Restrictions: with bilateral post op shoes donned  ? ?  ? ?Mobility Bed Mobility ?Overal bed mobility: Needs Assistance ?Bed Mobility: Sit to Supine ?  ?  ?  ?Sit to supine: +2 for physical assistance, Max assist ?  ?General bed mobility comments: very, very guarded, painful and limited ?  ? ?Transfers ?Overall transfer level: Needs assistance ?Equipment used: 2 person hand held assist ?  ?  ?  ?  ?  ?  ?  ?General transfer comment: unable to come to full standing. He is able to laterally scoot and squat pivot while in trendelenburg (downwards towards HOB to increase ease) with min - mod A of  2 ?  ? ?  ?Balance Overall balance assessment: Needs assistance ?Sitting-balance support: Feet supported, Single extremity supported ?Sitting balance-Leahy Scale: Good ?  ?  ?  ?  ?  ?  ?  ?  ?  ?  ?  ?  ?  ?  ?  ?  ?    ? ?ADL either performed or assessed with clinical judgement  ? ?ADL   ?  ?  ?  ?  ?  ?  ?  ?  ?  ?  ?  ?  ?  ?  ?  ?  ?  ?  ?  ?General ADL Comments: Pt is very pain limited on eval and would require total A  ? ? ? ?Vision Patient Visual Report: No change from baseline ?   ?   ?   ?   ? ?Pertinent Vitals/Pain Pain Assessment ?Pain Assessment: Faces ?Faces Pain Scale: Hurts worst ?Pain Location: back pain ?Pain Descriptors / Indicators: Aching, Crying, Grimacing, Guarding, Jabbing, Spasm, Shooting ?Pain Intervention(s): Monitored during session, Premedicated before session, Repositioned, Patient requesting pain meds-RN notified, Limited activity within patient's tolerance  ? ? ? ?Hand Dominance Right ?  ?Extremity/Trunk Assessment Upper Extremity Assessment ?Upper Extremity Assessment: Overall WFL for tasks assessed;Generalized weakness ?  ?Lower Extremity Assessment ?Lower Extremity Assessment: Generalized weakness ?  ?  ?  ?Communication Communication ?Communication: No difficulties ?  ?Cognition Arousal/Alertness: Awake/alert ?Behavior During Therapy: [facility] for tasks assessed/performed ?Overall Cognitive Status: Within Functional Limits for tasks assessed ?  ?  ?  ?  ?  ?  ?  ?  ?  ?  ?  ?  ?  ?  ?  ?  ?  ?  ?  ?   ?   ?   ? ? ?Home Living Family/patient expects to be discharged to:: Private residence ?Living Arrangements: Spouse/significant other ?Available Help at Discharge: Available 24 hours/day ?Type of Home: House ?Home Access: Stairs to enter ?Entrance Stairs-Number of Steps: 2 ?Entrance Stairs-Rails: Right;Left ?Home Layout: One level ?  ?  ?Bathroom Shower/Tub: Walk-in shower ?  ?Bathroom Toilet: Handicapped height ?  ?  ?Home Equipment: Electric scooter;Cane - single point;Rolling Walker (2 wheels);Shower seat - built in ?  ?  ?  ? ?  ?Prior Functioning/Environment Prior Level of Function : Independent/Modified Independent;Driving ?  ?  ?  ?  ?  ?  ?Mobility Comments: SPC for household walking, power  scooter for distances >123f due to chronic low back pain. ?ADLs Comments: needs help with compression socks at baseline 2/2 CHF ?  ? ?  ?  ?OT Problem List: Decreased strength;Decreased activity tolerance;Impaired balance (sitting and/or standing);Pain;Decreased safety awareness;Decreased knowledge of use of DME or AE;Decreased knowledge of precautions ?  ?   ?OT Treatment/Interventions: Self-care/ADL training;Therapeutic exercise;Therapeutic activities;Energy conservation;DME and/or AE instruction;Patient/family education;Balance training  ?  ?OT Goals(Current goals can be found in the care plan section) Acute Rehab OT Goals ?Patient Stated Goal: to decrease pain ?OT Goal Formulation: With patient ?Time For Goal Achievement: 03/18/22 ?Potential to Achieve Goals: Good ?ADL Goals ?Pt Will Perform Grooming: with supervision;standing ?Pt Will Perform Lower Body Dressing: with min assist;sit to/from stand ?Pt Will Transfer to Toilet: with min assist;ambulating ?Pt Will Perform Toileting - Clothing Manipulation and hygiene: with min assist;sit to/from stand  ?OT Frequency: Min 2X/week ?  ? ?   ?AM-PAC OT "6 Clicks" Daily Activity     ?Outcome Measure Help from another person eating meals?:  None ?Help from another person taking care of personal grooming?: A Little ?Help from another person toileting, which includes using toliet, bedpan, or urinal?: Total ?Help from another person bathing (including washing, rinsing, drying)?: Total ?Help from another person to put on and taking off regular upper body clothing?: A Lot ?Help from another person to put on and taking off regular lower body clothing?: Total ?6 Click Score: 12 ?  ?End of Session Nurse Communication: Mobility status;Patient requests pain meds ? ?Activity Tolerance: Patient limited by pain ?Patient left: in bed;with call bell/phone within reach ? ?OT Visit Diagnosis: Unsteadiness on feet (R26.81);Repeated falls (R29.6);Muscle weakness (generalized)  (M62.81);Pain ?Pain - part of body:  (back)  ?              ?Time: 1855-0158 ?OT Time Calculation (min): 19 min ?Charges:  OT General Charges ?$OT Visit: 1 Visit ?OT Evaluation ?$OT Eval Moderate Complexity: 1 Mod ?O

## 2022-03-04 NOTE — Care Management Important Message (Signed)
Important Message ? ?Patient Details  ?Name: Joel Dominguez ?MRN: [id number] ?Date of Birth: 1950-11-25 ? ? ?Medicare Important Message Given:  N/A - LOS <3 / Initial given by admissions ? ? ? ? ?Juliann Pulse A Dominic Mahaney ?03/04/2022, 10:33 AM ?

## 2022-03-04 NOTE — Progress Notes (Signed)
Patient is alert and oriented x 4. Room air. Up with assistance due to constant complaints of pain. Patient has been expressing an increase of lower lumbar pain that he describes as sharp and throbbing, throughout the shift. The patient states that due to his pain that he is unable to get up, turn positions, or sit at the edge of the bed. PRN medications given with no relief from the patient. Patient continues to express a 10/10 pain. [facility] hospitalist paged regarding the patient's complaints of pain. However, not orders were placed or changes made to the current pain regimen. I will pass this information to the day shift nurse to discuss with the MD.  ? ?Bed is locked in the lowest position. PRN pain medication given. Will follow-up for a pain reassessment.  ?

## 2022-03-04 NOTE — NC FL2 (Signed)
?[place] MEDICAID FL2 LEVEL OF CARE SCREENING TOOL  ?  ? ?IDENTIFICATION  ?Patient Name: ?Joel Dominguez Birthdate: 1951-08-05 Sex: male Admission Date (Current Location): ?03/01/2022  ?[place] and [place] Number: ? [place] ?  Facility and Address:  ?[facility], [address], [place], [place] [address] ?     Provider Number: ?[id number]  ?Attending Physician Name and Address:  ?Gwynne Edinger, MD ? Relative Name and Phone Number:  ?  ?   ?Current Level of Care: ?Hospital Recommended Level of Care: ?[place] Prior Approval Number: ?  ? ?Date Approved/Denied: ?  PASRR Number: ?[id number] A ? ?Discharge Plan: ?SNF ?  ? ?Current Diagnoses: ?Patient Active Problem List  ? Diagnosis Date Noted  ? Sepsis ([place]) 03/01/2022  ? Systemic inflammatory response syndrome (SIRS) ([place]) 03/01/2022  ? Hypertension   ? Heart disease   ? CHF (congestive heart failure) ([place])   ? Long term current use of diuretic 12/01/2019  ? Anemia 12/01/2019  ? Heme positive stool 11/17/2019  ? Physical debility 11/11/2019  ? Acute blood loss anemia 11/05/2019  ? Cellulitis 11/2019  ? Chronic congestive heart failure ([place])   ? Longstanding persistent atrial fibrillation ([place])   ? Chronic foot ulcer ([place]) 08/08/2019  ? Prediabetes 08/08/2019  ? Left leg weakness 08/08/2019  ? High risk medication use 08/08/2019  ? Chronic ulcer of toe ([place]) 08/04/2019  ? Pain due to onychomycosis of toenails of both feet 04/30/2019  ? Coagulation disorder ([place]) 04/30/2019  ? Acute exacerbation of chronic low back pain 03/03/2018  ? Lumbosacral radiculopathy 01/22/2018  ? Osteoarthritis 11/19/2017  ? Vitamin D deficiency 11/19/2017  ? Positive for microalbuminuria 11/19/2017  ? Hypertensive heart disease with heart failure ([place]) 11/10/2017  ? 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 fascicular block) 11/07/2017  ? Aortic stenosis 11/06/2017  ? H/O noncompliance with medical treatment, presenting hazards to health  10/22/2017  ? h/o Stroke 10/08/2016  ? Atherosclerotic heart disease of native coronary artery with unspecified angina pectoris ([place]) 10/08/2016  ? Sleep apnea with use of continuous positive airway pressure (CPAP) 10/08/2016  ? Chronic combined systolic and diastolic CHF (congestive heart failure) ([place]) 10/08/2016  ? Generalized OA 10/08/2016  ? Foot drop- R 10/08/2016  ? Lumbar spinal stenosis 10/08/2016  ? Pulmonary hypertension ([place]) 10/08/2016  ? Chronic atrial fibrillation ([place]) 10/08/2016  ? Chronic anticoagulation 10/08/2016  ? Obesity, Class III, BMI 40-49.9 (morbid obesity) ([place]) 10/08/2016  ? Spinal stenosis, lumbar region, with neurogenic claudication 10/08/2016  ? OSA (obstructive sleep apnea) 10/08/2016  ? A-fib 10/08/2016  ? ? ?Orientation RESPIRATION BLADDER Height & Weight   ?  ?Self, Time, Situation, Place ? Normal Continent, External catheter Weight: (!) 310 lb (140.6 kg) ?Height:  6' (182.9 cm)  ?BEHAVIORAL SYMPTOMS/MOOD NEUROLOGICAL BOWEL NUTRITION STATUS  ? (None)  (None) Continent Diet (Regular)  ?AMBULATORY STATUS COMMUNICATION OF NEEDS Skin   ?Extensive Assist Verbally Surgical wounds (Incision on left toe (sutures, xeroform, kerlex) and right toe (sutures, xeroform, kerlex, ace wrap).) ?  ?  ?  ?    ?     ?     ? ? ?Personal Care Assistance Level of Assistance  ?Total care   ?  ?  ?Total Care Assistance: Maximum assistance (Due to pain today. Will work with therapy again tomorrow (5/2) after receiving pain medication.)  ? ?Functional Limitations Info  ?Sight, Hearing, Speech Sight Info: Adequate ?Hearing Info: Adequate ?Speech Info: Adequate  ? ? ?SPECIAL CARE  FACTORS FREQUENCY  ?PT (By licensed PT), OT (By licensed OT)   ?  ?PT Frequency: 5 x week ?OT Frequency: 5 x week ?  ?  ?  ?   ? ? ?Contractures Contractures Info: Not present  ? ? ?Additional Factors Info  ?Code Status, Allergies Code Status Info: Full code ?Allergies Info: Cephalexin, Dilaudid (Hydromorphone) ?  ?  ?  ?   ? ?Current  Medications (03/04/2022):  This is the current hospital active medication list ?Current Facility-Administered Medications  ?Medication Dose Route Frequency Provider Last Rate Last Admin  ? 0.9 %  sodium chloride infusion  250 mL Intravenous PRN [name], DPM      ? 0.9 %  sodium chloride infusion   Intravenous Continuous Kate Sable, MD 20 mL/hr at 03/04/22 1257 New Bag at 03/04/22 1257  ? acetaminophen (TYLENOL) tablet 650 mg  650 mg Oral Q6H PRN [name], DPM   650 mg at 03/03/22 2107  ? Or  ? acetaminophen (TYLENOL) suppository 650 mg  650 mg Rectal Q6H PRN [name], DPM      ? ampicillin (OMNIPEN) 2 g in sodium chloride 0.9 % 100 mL IVPB  2 g Intravenous Q4H Wouk, Ailene Rud, MD 300 mL/hr at 03/04/22 1301 2 g at 03/04/22 1301  ? apixaban (ELIQUIS) tablet 5 mg  5 mg Oral BID [name], DPM   5 mg at 03/04/22 5102  ? B-complex with vitamin C tablet 1 tablet  1 tablet Oral Daily [name], DPM   1 tablet at 03/04/22 5852  ? carvedilol (COREG) tablet 12.5 mg  12.5 mg Oral BID WC [name], DPM   12.5 mg at 03/04/22 0850  ? cyclobenzaprine (FLEXERIL) tablet 10 mg  10 mg Oral TID PRN [name], DPM   10 mg at 03/03/22 0441  ? HYDROcodone-acetaminophen (NORCO) 10-325 MG per tablet 1 tablet  1 tablet Oral Q8H PRN [name], DPM   1 tablet at 03/04/22 1040  ? morphine (PF) 4 MG/ML injection 3 mg  3 mg Intravenous Q4H PRN Wouk, Ailene Rud, MD   3 mg at 03/04/22 1345  ? multivitamin with minerals tablet 1 tablet  1 tablet Oral Daily [name], DPM   1 tablet at 03/04/22 7782  ? ondansetron (ZOFRAN) tablet 4 mg  4 mg Oral Q6H PRN [name], DPM      ? Or  ? ondansetron (ZOFRAN) injection 4 mg  4 mg Intravenous Q6H PRN [name], DPM      ? polyethylene glycol (MIRALAX / GLYCOLAX) packet 17 g  17 g Oral Daily [name], DPM      ? senna (SENOKOT) tablet 8.6 mg  1 tablet Oral Daily Gwynne Edinger, MD   8.6 mg at 03/04/22 1340  ? sodium chloride flush  (NS) 0.9 % injection 3 mL  3 mL Intravenous Q12H [name], DPM   3 mL at 03/04/22 4235  ? sodium chloride flush (NS) 0.9 % injection 3 mL  3 mL Intravenous PRN [name], DPM   3 mL at 03/02/22 3614  ? [START ON 03/08/2022] Vitamin D (Ergocalciferol) (DRISDOL) capsule 50,000 Units  50,000 Units Oral Q7 days [name], DPM      ? ? ? ?Discharge Medications: ?Please see discharge summary for a list of discharge medications. ? ?Relevant Imaging Results: ? ?Relevant Lab Results: ? ? ?Additional Information ?SS#: [id number] ? ?  Candie Chroman, LCSW ? ? ? ? ?

## 2022-03-04 NOTE — Evaluation (Signed)
Physical Therapy Evaluation ?Patient Details ?Name: Joel Dominguez ?MRN: [id number] ?DOB: [birth year] ?Today's Date: 03/04/2022 ? ?History of Present Illness ? Joel Dominguez is a 58yoM who comes to [facility] on 4/28 c acute on chronic back pain. PMH: AF on eliquis, aortic stenosis, combined CHF, spinal stenosis, Rt foot drop 2/2 spondylosis, HTN. Pt admitted with SIRS c fever, tachycardia, and leukocytosis. Imaging of spine did not show any discitis or osteo but has some paraspinous muscular inflammation. MRI of left foot did show acute osteo to distal phalanx of left second toe. Infectious work up revealed + blood cx for e.faecalis. Pt taken to OR 4/30 c Dr. Posey Pronto podiatry for bilat 2nd digit partial ray resection. Postoperatively pt is WBAT in postop shoes bilat.  ?Clinical Impression ? Pt admitted with above diagnosis. Pt currently with functional limitations due to the deficits listed below (see "PT Problem List"). Upon entry, pt in bed, awake and agreeable to participate, back pain well controlled while supine- no foot pain at all since procedure. The pt is alert, pleasant, interactive, and able to provide info regarding prior level of function, both in tolerance and independence. ModA for slow, guarded movement to Rt EOB, appears quite painful, more comfortable at EOB. A few attempts to stand from elevated surface, unsuccessful, the last one with a severe pain exacerbation. Pt agreeable to remain at EOB while RN gives pain meds- OT evaluation to follow shortly. Patient's performance this date reveals decreased ability, independence, and tolerance in performing all basic mobility required for performance of activities of daily living. Pt requires additional DME, close physical assistance, and cues for safe participate in mobility. Pt will benefit from skilled PT intervention to increase independence and safety with basic mobility in preparation for discharge to the venue listed below.   ?   ?   ? ?Recommendations for  follow up therapy are one component of a multi-disciplinary discharge planning process, led by the attending physician.  Recommendations may be updated based on patient status, additional functional criteria and insurance authorization. ? ?Follow Up Recommendations Skilled nursing-short term rehab (<3 hours/day) ? ?  ?Assistance Recommended at Discharge Set up Supervision/Assistance  ?Patient can return home with the following ? Two people to help with walking and/or transfers;Two people to help with bathing/dressing/bathroom;Help with stairs or ramp for entrance;Assistance with cooking/housework;Assist for transportation ? ?  ?Equipment Recommendations    ?Recommendations for Other Services ?    ?  ?Functional Status Assessment Patient has had a recent decline in their functional status and demonstrates the ability to make significant improvements in function in a reasonable and predictable amount of time.  ? ?  ?Precautions / Restrictions Precautions ?Precautions: Fall ?Restrictions ?Weight Bearing Restrictions: Yes ?RLE Weight Bearing: Weight bearing as tolerated (in postop shoe) ?LLE Weight Bearing: Weight bearing as tolerated (in postop shoe)  ? ?  ? ?Mobility ? Bed Mobility ?Overal bed mobility: Needs Assistance ?Bed Mobility: Supine to Sit ?  ?  ?Supine to sit: Mod assist ?  ?  ?General bed mobility comments: very, very guarded, painful and limited ?  ? ?Transfers ?Overall transfer level: Needs assistance ?Equipment used: Rolling walker (2 wheels) ?Transfers: Sit to/from Stand ?Sit to Stand: Mod assist ?  ?  ?  ?  ?  ?General transfer comment: attempted twice, with large increase in pain afterward ?  ? ?Ambulation/Gait ?  ?  ?  ?  ?  ?  ?  ?  ? ?Stairs ?  ?  ?  ?  ?  ? ?  Wheelchair Mobility ?  ? ?Modified Rankin (Stroke Patients Only) ?  ? ?  ? ?Balance   ?  ?  ?  ?  ?  ?  ?  ?  ?  ?  ?  ?  ?  ?  ?  ?  ?  ?  ?   ? ? ? ?Pertinent Vitals/Pain Pain Assessment ?Pain Assessment: Faces ?Faces Pain Scale: Hurts  worst ?Pain Location: bakc pain upon attempting transfers x2  ? ? ?Home Living Family/patient expects to be discharged to:: Private residence ?Living Arrangements: Spouse/significant other ?Available Help at Discharge: Available 24 hours/day ?Type of Home: House ?Home Access: Stairs to enter ?Entrance Stairs-Rails: Right;Left ?Entrance Stairs-Number of Steps: 2 ?  ?Home Layout: One level ?Home Equipment: Electric scooter;Cane - single point;Rolling Walker (2 wheels);Shower seat - built in ?   ?  ?Prior Function Prior Level of Function : Independent/Modified Independent;Driving ?  ?  ?  ?  ?  ?  ?Mobility Comments: SPC for household walking, power scooter for distances >124f due to chronic low back pain. ?ADLs Comments: needs help with compression socks at baseline 2/2 CHF ?  ? ? ?Hand Dominance  ?   ? ?  ?Extremity/Trunk Assessment  ? Upper Extremity Assessment ?Upper Extremity Assessment: Overall WFL for tasks assessed;Generalized weakness ?  ? ?Lower Extremity Assessment ?Lower Extremity Assessment: Generalized weakness ?  ? ?   ?Communication  ?    ?Cognition Arousal/Alertness: Awake/alert ?Behavior During Therapy: [facility] tasks assessed/performed ?Overall Cognitive Status: Within Functional Limits for tasks assessed ?  ?  ?  ?  ?  ?  ?  ?  ?  ?  ?  ?  ?  ?  ?  ?  ?  ?  ?  ? ?  ?General Comments   ? ?  ?Exercises    ? ?Assessment/Plan  ?  ?PT Assessment Patient needs continued PT services  ?PT Problem List Decreased strength;Decreased range of motion;Decreased activity tolerance;Decreased balance;Decreased mobility;Decreased coordination;Decreased safety awareness ? ?   ?  ?PT Treatment Interventions DME instruction;Gait training;Stair training;Functional mobility training;Therapeutic activities;Therapeutic exercise;Balance training;Neuromuscular re-education;Patient/family education   ? ?PT Goals (Current goals can be found in the Care Plan section)  ?Acute Rehab PT Goals ?Patient Stated Goal: Regain ability to  stand/walk unassisted ?PT Goal Formulation: With patient ?Time For Goal Achievement: 03/18/22 ?Potential to Achieve Goals: Good ? ?  ?Frequency 7X/week ?  ? ? ?Co-evaluation   ?  ?  ?  ?  ? ? ?  ?AM-PAC PT "6 Clicks" Mobility  ?Outcome Measure Help needed turning from your back to your side while in a flat bed without using bedrails?: Total ?Help needed moving from lying on your back to sitting on the side of a flat bed without using bedrails?: Total ?Help needed moving to and from a bed to a chair (including a wheelchair)?: Total ?Help needed standing up from a chair using your arms (e.g., wheelchair or bedside chair)?: Total ?Help needed to walk in hospital room?: Total ?Help needed climbing 3-5 steps with a railing? : Total ?6 Click Score: 6 ? ?  ?End of Session   ?Activity Tolerance: Patient limited by pain;Treatment limited secondary to medical complications (Comment) ?Patient left: in bed;with call bell/phone within reach;with nursing/sitter in room ?Nurse Communication: Mobility status ?PT Visit Diagnosis: Other abnormalities of gait and mobility (R26.89);Difficulty in walking, not elsewhere classified (R26.2);Muscle weakness (generalized) (M62.81) ?  ? ?Time: 1001-1040 ?PT Time Calculation (min) (ACUTE  ONLY): 39 min ? ? ?Charges:   PT Evaluation ?$PT Eval High Complexity: 1 High ?PT Treatments ?$Therapeutic Activity: 8-22 mins ?  ?   ? ?1:23 PM, 03/04/22 ?Etta Grandchild, PT, DPT ?Physical Therapist - [place] ?[facility]  ?[id number] (ASCOM)  ? ? ?Dawsen Krieger C ?03/04/2022, 1:19 PM ? ?

## 2022-03-05 ENCOUNTER — Encounter
Admission: EM | Disposition: A | Discharge status: Skilled Nursing Facility | Payer: Self-pay | Source: Home / Self Care | Attending: Obstetrics and Gynecology

## 2022-03-05 ENCOUNTER — Inpatient Hospital Stay (HOSPITAL_COMMUNITY)
Admit: 2022-03-05 | Discharge: 2022-03-05 | Disposition: A | Discharge status: Home / Self Care | Payer: Medicare Other | Attending: Cardiology | Admitting: Cardiology

## 2022-03-05 DIAGNOSIS — I361 Nonrheumatic tricuspid (valve) insufficiency: Secondary | ICD-10-CM

## 2022-03-05 DIAGNOSIS — I340 Nonrheumatic mitral (valve) insufficiency: Secondary | ICD-10-CM

## 2022-03-05 DIAGNOSIS — M869 Osteomyelitis, unspecified: Secondary | ICD-10-CM

## 2022-03-05 DIAGNOSIS — A419 Sepsis, unspecified organism: Secondary | ICD-10-CM | POA: Diagnosis not present

## 2022-03-05 DIAGNOSIS — R6520 Severe sepsis without septic shock: Secondary | ICD-10-CM | POA: Diagnosis not present

## 2022-03-05 HISTORY — PX: TEE WITHOUT CARDIOVERSION: SHX5443

## 2022-03-05 LAB — BASIC METABOLIC PANEL
Anion gap: 10 (ref 5–15)
BUN: 19 mg/dL (ref 8–23)
CO2: 24 mmol/L (ref 22–32)
Calcium: 8.1 mg/dL — ABNORMAL LOW (ref 8.9–10.3)
Chloride: 102 mmol/L (ref 98–111)
Creatinine, Ser: 0.93 mg/dL (ref 0.61–1.24)
GFR, Estimated: >60 mL/min (ref >60)
Glucose, Bld: 128 mg/dL — ABNORMAL HIGH (ref 70–99)
Potassium: 3.7 mmol/L (ref 3.5–5.1)
Sodium: 136 mmol/L (ref 135–145)

## 2022-03-05 LAB — MAGNESIUM: Magnesium: 2.1 mg/dL (ref 1.7–2.4)

## 2022-03-05 SURGERY — ECHOCARDIOGRAM, TRANSESOPHAGEAL
Anesthesia: Moderate Sedation

## 2022-03-05 MED ORDER — MIDAZOLAM HCL 2 MG/2ML IJ SOLN
INTRAMUSCULAR | Status: AC
  Filled 2022-03-05: qty 4

## 2022-03-05 MED ORDER — FENTANYL CITRATE (PF) 100 MCG/2ML IJ SOLN
INTRAMUSCULAR | Status: AC
  Filled 2022-03-05: qty 2

## 2022-03-05 MED ORDER — LIDOCAINE VISCOUS HCL 2 % MT SOLN
OROMUCOSAL | Status: AC
Start: 2022-03-05 — End: 2022-03-06
  Filled 2022-03-05: qty 15

## 2022-03-05 MED ORDER — FENTANYL CITRATE (PF) 100 MCG/2ML IJ SOLN
INTRAMUSCULAR | Status: AC | PRN
Start: 2022-03-05 — End: 2022-03-05
  Administered 2022-03-05 (×2): 25 ug via INTRAVENOUS
  Administered 2022-03-05: 50 ug via INTRAVENOUS

## 2022-03-05 MED ORDER — SODIUM CHLORIDE FLUSH 0.9 % IV SOLN
INTRAVENOUS | Status: AC
  Filled 2022-03-05: qty 10

## 2022-03-05 MED ORDER — MIDAZOLAM HCL 5 MG/5ML IJ SOLN
INTRAMUSCULAR | Status: AC | PRN
  Administered 2022-03-05 (×2): 1 mg via INTRAVENOUS
  Administered 2022-03-05: 2 mg via INTRAVENOUS

## 2022-03-05 MED ORDER — BUTAMBEN-TETRACAINE-BENZOCAINE 2-2-14 % EX AERO
INHALATION_SPRAY | CUTANEOUS | Status: AC
  Filled 2022-03-05: qty 5

## 2022-03-05 NOTE — Progress Notes (Signed)
?PROGRESS NOTE ? ? ? ?Joel Dominguez  PPI:[id number] DOB: 1951-03-17 DOA: 03/01/2022 ?PCP: Lorrene Reid, PA-C  ?Outpatient Specialists: podiatry ? ? ? ?Brief Narrative:  ? ?From admission h and p ?Joel Dominguez is a 71 y.o. male with medical history significant for chronic A-fib on anticoagulation with Eliquis, mild aortic stenosis, chronic combined systolic and diastolic dysfunction CHF with last known LVEF of 40 to 45% from a 2D echocardiogram which was done 04/23, spinal stenosis, right foot drop due to lumbar spinal stenosis, hypertension, morbid obesity (BMI 42) who presents to the ER for evaluation of 2 days of worsening low back pain. ?Patient has a history of chronic low back pain which got worse over the last 2 days. ?Per patient's wife who provides most of the history he was working in the yard 2 days prior to admission and had to bend over to pull some weeds.  He subsequently developed pain in his lower back and had tried over-the-counter pain medication as well as prescription meds without any relief.  He tried to get around the house with a rolling walker but over the last 12 hours has been unable to ambulate due to worsening pain that he rates 10 x 10 in intensity at its worst.  Any movement of his legs causes worsening of his back pain.  He denies having any urinary/fecal retention or incontinence and denies having any saddle anesthesia. ?He was recently discharged from the hospital and evaluated for sepsis from right leg cellulitis. ?He denies having any cough, no shortness of breath, no headache, no dizziness, no lightheadedness, no nausea, no vomiting, no changes in his bowel habits, no focal deficits or blurred vision. ?He had a fever upon arrival to the ER with a Tmax of 100.4 and is tachycardic. ? ? ?Assessment & Plan: ?  ?Principal Problem: ?  Sepsis ([place]) ?Active Problems: ?  Systemic inflammatory response syndrome (SIRS) (HCC) ?  Acute exacerbation of chronic low back pain ?  Spinal  stenosis, lumbar region, with neurogenic claudication ?  Chronic combined systolic and diastolic CHF (congestive heart failure) ([place]) ?  Longstanding persistent atrial fibrillation ([place]) ?  Obesity, Class III, BMI 40-49.9 (morbid obesity) ([place]) ? ?# Sepsis ?2/2 bacteremia. By leukocytosis, fever. Fluid resuscitated, hemodynamically stable. Sepsis physiology resolved ? ?# Enterococcus bacteremia ?In 3/4 blood cultures. Presumably from toe osteo. TTE neg for vegetations as is TEE. Repeat blood cultures from 4/30 ngtd. Pan-sensitive ?- ampicillin per pharmacy ? ?# Osteomyelitis ?Of b/l second toes, now s/p amputation on 4/30, clean margins ?- PT/OT consults, have seen today, advises snf, pt is agreeable ?- WBAT in surgical shoe, keep dressing intact until 1 week podiatry f/u ?- f/u pathology ? ?# AKI ?Mild, 2/2 sepsis. resolved ? ?# Low back pain ?MRI with multilevel ddd, no signs infection. Still with sig pain with movement ?- pain control, bowel regimen ?- PT/OT ? ?# A fib ?Rate controlled ?- cont home coreg, apixaban ? ?# dCHF ?Appears compensated. Bp wnl ?- cont home coreg ?- resume bumex  and spiro at some point ?- stop farxiga, contraindicated given lower extremity infection ? ?# OSA ?- cpap qhs ? ?# morbid obesity ? ?DVT prophylaxis: lovenox ?Code Status: full ?Family Communication: wife updated @ bedside 5/2 ? ?Level of care: Med-Surg ?Status is: Inpatient ?Remains inpatient appropriate because: pending snf placement ? ? ? ?Consultants:  ?podiatry ? ?Procedures: ?none ? ?Antimicrobials:  ?ampicillin  ? ? ?Subjective: ?Back pain with PT today. Tolerating diet. Tolerated TEE ? ?Objective: ?Vitals:  ?  03/05/22 1304 03/05/22 1305 03/05/22 1315 03/05/22 1330  ?BP:  120/71  120/79  ?Pulse: 95 86  86  ?Resp: 18 (!) 21  19  ?Temp:      ?TempSrc:      ?SpO2:  95% 94% 94%  ?Weight:      ?Height:      ? ? ?Intake/Output Summary (Last 24 hours) at 03/05/2022 1424 ?Last data filed at 03/05/2022 0410 ?Gross per 24 hour  ?Intake  0 ml  ?Output 1800 ml  ?Net -1800 ml  ? ?Filed Weights  ? 03/01/22 1215 03/05/22 1226  ?Weight: (!) 140.6 kg (!) 140.6 kg  ? ? ?Examination: ? ?General exam: in pain. diaphoretic ?Respiratory system: Clear to auscultation. Respiratory effort normal. ?Cardiovascular system: S1 & S2 heard, RRR. No JVD, murmurs, rubs, gallops or clicks. 1+ le edema ?Gastrointestinal system: Abdomen is obese, soft and nontender. No organomegaly or masses felt. Normal bowel sounds heard. ?Central nervous system: Alert and oriented. No focal neurological deficits. ?Extremities: Symmetric 5 x 5 power. Surgical dressing b/l feet ?Psychiatry: Judgement and insight appear normal. Mood & affect appropriate.  ? ? ? ?Data Reviewed: I have personally reviewed following labs and imaging studies ? ?CBC: ?Recent Labs  ?Lab 03/01/22 ?1222 03/02/22 ?0639 03/03/22 ?9702  ?WBC 13.9* 10.8* 8.9  ?NEUTROABS 11.9*  --   --   ?HGB 13.4 12.3* 12.8*  ?HCT 43.4 37.9* 40.3  ?MCV 94.1 90.9 88.8  ?PLT 194 188 204  ? ?Basic Metabolic Panel: ?Recent Labs  ?Lab 03/01/22 ?1222 03/02/22 ?0639 03/02/22 ?1113 03/04/22 ?6378 03/05/22 ?0436  ?NA 134* 135 135 136 136  ?K 4.3 4.2 4.5 3.9 3.7  ?CL 102 100 101 105 102  ?CO2 '22 27 28 24 24  '$ ?GLUCOSE 125* 124* 121* 123* 128*  ?BUN 27* 24* 28* 22 19  ?CREATININE 1.23 1.31* 1.44* 0.92 0.93  ?CALCIUM 9.3 8.7* 8.9 8.3* 8.1*  ?MG  --   --   --   --  2.1  ? ?GFR: ?Estimated Creatinine Clearance: 107.5 mL/min (by C-G formula based on SCr of 0.93 mg/dL). ?Liver Function Tests: ?Recent Labs  ?Lab 03/01/22 ?1222  ?AST 18  ?ALT 14  ?ALKPHOS 60  ?BILITOT 1.5*  ?PROT 7.9  ?ALBUMIN 3.6  ? ?No results for input(s): LIPASE, AMYLASE in the last 168 hours. ?No results for input(s): AMMONIA in the last 168 hours. ?Coagulation Profile: ?Recent Labs  ?Lab 03/02/22 ?5885  ?INR 1.6*  ? ?Cardiac Enzymes: ?No results for input(s): CKTOTAL, CKMB, CKMBINDEX, TROPONINI in the last 168 hours. ?BNP (last 3 results) ?Recent Labs  ?  10/10/21 ?1151  ?PROBNP  508*  ? ?HbA1C: ?No results for input(s): HGBA1C in the last 72 hours. ?CBG: ?Recent Labs  ?Lab 03/01/22 ?2033 03/02/22 ?0829 03/02/22 ?1119 03/02/22 ?1635  ?GLUCAP 123* 134* 111* 126*  ? ?Lipid Profile: ?No results for input(s): CHOL, HDL, LDLCALC, TRIG, CHOLHDL, LDLDIRECT in the last 72 hours. ?Thyroid Function Tests: ?No results for input(s): TSH, T4TOTAL, FREET4, T3FREE, THYROIDAB in the last 72 hours. ?Anemia Panel: ?No results for input(s): VITAMINB12, FOLATE, FERRITIN, TIBC, IRON, RETICCTPCT in the last 72 hours. ?Urine analysis: ?   ?Component Value Date/Time  ? COLORURINE YELLOW (A) 03/01/2022 1240  ? APPEARANCEUR HAZY (A) 03/01/2022 1240  ? APPEARANCEUR Clear 08/28/2018 1150  ? LABSPEC 1.019 03/01/2022 1240  ? PHURINE 5.0 03/01/2022 1240  ? GLUCOSEU NEGATIVE 03/01/2022 1240  ? [place] NEGATIVE 03/01/2022 1240  ? [place] NEGATIVE 03/01/2022 1240  ? BILIRUBINUR Negative 08/28/2018  1150  ? KETONESUR 5 (A) 03/01/2022 1240  ? PROTEINUR NEGATIVE 03/01/2022 1240  ? NITRITE NEGATIVE 03/01/2022 1240  ? LEUKOCYTESUR NEGATIVE 03/01/2022 1240  ? ?Sepsis Labs: ?'@LABRCNTIP'$ (procalcitonin:4,lacticidven:4) ? ?) ?Recent Results (from the past 240 hour(s))  ?Culture, blood (routine x 2)     Status: Abnormal  ? Collection Time: 03/01/22 12:22 PM  ? Specimen: BLOOD  ?Result Value Ref Range Status  ? Specimen Description   Final  ?  BLOOD LEFT HAND ?Performed at [facility], [address], [place], [place] [address] ?  ? Special Requests   Final  ?  BOTTLES DRAWN AEROBIC AND ANAEROBIC Blood Culture results may not be optimal due to an inadequate volume of blood received in culture bottles ?Performed at [facility], [address] [place],  [address] ?  ? Culture  Setup Time   Final  ?  GRAM POSITIVE COCCI ?IN BOTH AEROBIC AND ANAEROBIC BOTTLES ?CRITICAL RESULT CALLED TO, READ BACK BY AND VERIFIED WITH: ?JASON ROBINS @ 0535 02/27/22 LFD ?Performed at [facility], [address]  [place],  [address] ?  ? Culture (A)  Final  ?  ENTEROCOCCUS FAECALIS ?SUSCEPTIBILITIES PERFORMED ON PREVIOUS CULTURE WITHIN THE LAST 5 DAYS. ?Performed at [facility], [place],

## 2022-03-05 NOTE — Progress Notes (Signed)
This morning patient woke up yielding that he had fallen; he was in bed.  He also said he was laying on the table. The Tech and I went to the room and found him in the bed.  He was speaking to somebody, Gerald Stabs, his wife whom he was seeing sitting on the chair on his right; she was not there.  He had toppled everything to the ground and there was a pool of water on the floor.  He also pulled out all his IV lines, 2 of them. I had to ask the IV team to start a new one.  His wife just came out of the room now stating that he is getting confused, again.  She said, he gets very confused with Dilaudid the same way.  Could it be because he is getting an increased dose of Morphine IV? ? ?The above message secure chatted to the attending MD. ?

## 2022-03-05 NOTE — Progress Notes (Signed)
*  PRELIMINARY RESULTS* ?Echocardiogram ?Echocardiogram Transesophageal has been performed. ? ?Tom Macpherson, [place] Side ?03/05/2022, 1:57 PM ?

## 2022-03-05 NOTE — Progress Notes (Signed)
? ?Date of Admission:  03/01/2022    ?ID: Joel Dominguez is a 71 y.o. male  ?Principal Problem: ?  Sepsis ([place]) ?Active Problems: ?  Chronic combined systolic and diastolic CHF (congestive heart failure) ([place]) ?  Obesity, Class III, BMI 40-49.9 (morbid obesity) ([place]) ?  Acute exacerbation of chronic low back pain ?  Longstanding persistent atrial fibrillation ([place]) ?  Spinal stenosis, lumbar region, with neurogenic claudication ?  Systemic inflammatory response syndrome (SIRS) (HCC) ? ?Patient seen with Dr. Amalia Hailey ?Wife at bedside ? ?Subjective: ?Underwent TEE today.  No endocarditis ?Has not been able to walk or stand up with PT ?Still planning for SNF ? ?Medications:  ? apixaban  5 mg Oral BID  ? B-complex with vitamin C  1 tablet Oral Daily  ? butamben-tetracaine-benzocaine      ? carvedilol  12.5 mg Oral BID WC  ? fentaNYL      ? lidocaine      ? midazolam      ? multivitamin with minerals  1 tablet Oral Daily  ? polyethylene glycol  17 g Oral Daily  ? senna  1 tablet Oral Daily  ? sodium chloride flush  3 mL Intravenous Q12H  ? sodium chloride flush      ? [START ON 03/08/2022] Vitamin D (Ergocalciferol)  50,000 Units Oral Q7 days  ? ? ?Objective: ?Vital signs in last 24 hours: ?Temp:  [97.7 ?F (36.5 ?C)-98.8 ?F (37.1 ?C)] 98.8 ?F (37.1 ?C) (05/02 1226) ?Pulse Rate:  [85-97] 86 (05/02 1330) ?Resp:  [11-21] 19 (05/02 1330) ?BP: (120-165)/(60-85) 120/79 (05/02 1330) ?SpO2:  [92 %-98 %] 94 % (05/02 1330) ?Weight:  [140.6 kg] 140.6 kg (05/02 1226) ? ?PHYSICAL EXAM:  ?General: Alert, cooperative, morbid obesity ?Lungs: Bilateral air entry ?Heart: Regular rate and rhythm, no murmur, rub or gallop. ?Abdomen: Soft, non-tender,not distended. Bowel sounds normal. No masses ?Extremities: Surgical dressing removed ?Both sites of partial amputation second toes looking well.  No discharge no erythema  ? ? ? ? ?skin: No rashes or lesions. Or bruising ?Lymph: Cervical, supraclavicular normal. ?Neurologic: Grossly non-focal ? ?Lab  Results ?Recent Labs  ?  03/03/22 ?8786 03/04/22 ?7672 03/05/22 ?0436  ?WBC 8.9  --   --   ?HGB 12.8*  --   --   ?HCT 40.3  --   --   ?NA  --  136 136  ?K  --  3.9 3.7  ?CL  --  105 102  ?CO2  --  24 24  ?BUN  --  22 19  ?CREATININE  --  0.92 0.93  ? ? ? ?Microbiology: ? ?Studies/Results: ?No results found. ? ? ?Assessment/Plan: ?Enterococcus faecalis bacteremia.  Possible source of the toe ulcers but no cultures were sent.  TEE no evidence of endocarditis ?Repeat blood cultures negative so far ?It is interesting to note that in May 2021 patient had endophthalmitis left eye in which was culture was positive for Enterococcus faecalis.  Apparently he was getting injections in his left eye prior to that.  Plan would be to give him 4 to 6 weeks of IV antibiotics. ?PICC can be placed tomorrow ? ?History of lumbar spinal canal stenosis now with severe acute pain secondary to mechanical dysfunction ?MRI does not show any evidence of infection.  He has got paraspinal muscle inflammation ? ?History of recurrent cellulitis of the right leg ? ?Bilateral venous edema.  Right more than left ? ?History of bilateral knee transplants.  No evidence of infection. ?A-fib  on Eliquis ? ?CHF on carvedilol ? ?Morbid obesity ? ?Discussed the management with the patient and his wife and Dr. Amalia Hailey. ?  ?

## 2022-03-05 NOTE — Progress Notes (Signed)
Physical Therapy Treatment ?Patient Details ?Name: Joel Dominguez ?MRN: [id number] ?DOB: 1951-03-05 ?Today's Date: 03/05/2022 ? ? ?History of Present Illness Wilferd Ritson is a 24yoM who comes to [facility] on 4/28 c acute on chronic back pain. PMH: AF on eliquis, aortic stenosis, combined CHF, spinal stenosis, Rt foot drop 2/2 spondylosis, HTN. Pt admitted with SIRS c fever, tachycardia, and leukocytosis. Imaging of spine did not show any discitis or osteo but has some paraspinous muscular inflammation. MRI of left foot did show acute osteo to distal phalanx of left second toe. Infectious work up revealed + blood cx for e.faecalis. Pt taken to OR 4/30 c Dr. Posey Pronto podiatry for bilat 2nd digit partial ray resection. Postoperatively pt is WBAT in postop shoes bilat. ? ?  ?PT Comments  ? ? Pt eager to work with PT and had hoped to do well enough to show that he can return home (and avoid rehab).  Pt showed great effort and motivation t/o the session but was simply very pain limited with almost all tasks.  Did have some IV pain meds 2+ hours prior to session, nursing to give oral pain meds when able (and pt aware during session).  He was able to do large portion of the bed mobility tasks w/o direct assist but did ultimately need mod assist with final transition to/from sitting/supine but his biggest issue remains considerable pain with all movement but even more so during standing/WBing.  He could not attain upright posture even with heavy UE walker use, and despite great effort and multiple attempts each standing bout he could not do more than drag either foot an inch or 2 to adjust stance but no real steps or ambulation.  Pt knows he is not safe to go home with his current functional status - continue to recommend STR.   ?Recommendations for follow up therapy are one component of a multi-disciplinary discharge planning process, led by the attending physician.  Recommendations may be updated based on patient status, additional  functional criteria and insurance authorization. ? ?Follow Up Recommendations ? Skilled nursing-short term rehab (<3 hours/day) ?  ?  ?Assistance Recommended at Discharge Set up Supervision/Assistance  ?Patient can return home with the following Two people to help with walking and/or transfers;Two people to help with bathing/dressing/bathroom;Help with stairs or ramp for entrance;Assistance with cooking/housework;Assist for transportation ?  ?Equipment Recommendations ? None recommended by PT  ?  ?Recommendations for Other Services   ? ? ?  ?Precautions / Restrictions Precautions ?Precautions: Fall ?Required Braces or Orthoses:  (R CAM walker, L post-op shoe) ?Restrictions ?RLE Weight Bearing: Weight bearing as tolerated ?LLE Weight Bearing: Weight bearing as tolerated ?Other Position/Activity Restrictions: with bilateral post op shoes donned  ?  ? ?Mobility ? Bed Mobility ?Overal bed mobility: Needs Assistance ?Bed Mobility: Supine to Sit, Sit to Supine ?  ?  ?Supine to sit: Min assist ?Sit to supine: Min assist ?  ?General bed mobility comments: pain limited/guarded.  Able to do most of each transition to/from supine but slowly, with heavy UE use and clearly having pain (LBP) during transitions ?  ? ?Transfers ?Overall transfer level: Needs assistance ?Equipment used: Rolling walker (2 wheels) ?Transfers: Sit to/from Stand ?Sit to Stand: Min assist ?  ?  ?  ?  ?  ?General transfer comment: Pt was hesitant (2/2 pain) but motivated (2/2 wanting to go home) with sit to stand transitions.  Elevated bed ~3" and had a solid hand on the foot board - pt able  to get to standing/near standing on 3 occasions w/ only light CGA/min assist but he instantly started c/o low back pain and unable to attain fully upright posture.  Showed poor tolerance and needing to sit relatively quickly (15-20 seconds) with each effort and despite being very motivated could not tolerate even a single step much less actual ambulation with WBing ?   ? ?Ambulation/Gait ?  ?  ?  ?  ?  ?  ?  ?General Gait Details: unable due to pain and weakness ? ? ?Stairs ?  ?  ?  ?  ?  ? ? ?Wheelchair Mobility ?  ? ?Modified Rankin (Stroke Patients Only) ?  ? ? ?  ?Balance Overall balance assessment: Needs assistance ?Sitting-balance support: Feet supported, Single extremity supported ?Sitting balance-Leahy Scale: Good ?  ?  ?Standing balance support: Bilateral upper extremity supported ?Standing balance-Leahy Scale: Fair ?Standing balance comment: Pt struggled with static standing tolerance, no LOBs but unable to tolerate even 30 seconds in upright with back pain, hunched posturing and heavy walker reliance ?  ?  ?  ?  ?  ?  ?  ?  ?  ?  ?  ?  ? ?  ?Cognition Arousal/Alertness: Awake/alert ?Behavior During Therapy: [facility] for tasks assessed/performed ?Overall Cognitive Status: Within Functional Limits for tasks assessed ?  ?  ?  ?  ?  ?  ?  ?  ?  ?  ?  ?  ?  ?  ?  ?  ?  ?  ?  ? ?  ?Exercises   ? ?  ?General Comments   ?  ?  ? ?Pertinent Vitals/Pain Pain Assessment ?Pain Assessment: 0-10 ?Pain Score: 5  ?Pain Location: back pain, increases significantly with movement/WBing  ? ? ?Home Living   ?  ?  ?  ?  ?  ?  ?  ?  ?  ?   ?  ?Prior Function    ?  ?  ?   ? ?PT Goals (current goals can now be found in the care plan section) Progress towards PT goals: Progressing toward goals ? ?  ?Frequency ? ? ? 7X/week ? ? ? ?  ?PT Plan Current plan remains appropriate  ? ? ?Co-evaluation   ?  ?  ?  ?  ? ?  ?AM-PAC PT "6 Clicks" Mobility   ?Outcome Measure ? Help needed turning from your back to your side while in a flat bed without using bedrails?: A Little ?Help needed moving from lying on your back to sitting on the side of a flat bed without using bedrails?: A Lot ?Help needed moving to and from a bed to a chair (including a wheelchair)?: A Lot ?Help needed standing up from a chair using your arms (e.g., wheelchair or bedside chair)?: A Little ?Help needed to walk in hospital room?:  Total ?Help needed climbing 3-5 steps with a railing? : Total ?6 Click Score: 12 ? ?  ?End of Session Equipment Utilized During Treatment: Gait belt ?Activity Tolerance: Patient limited by pain;Patient limited by fatigue ?Patient left: with bed alarm set;with nursing/sitter in room;with family/visitor present ?Nurse Communication: Mobility status ?PT Visit Diagnosis: Other abnormalities of gait and mobility (R26.89);Difficulty in walking, not elsewhere classified (R26.2);Muscle weakness (generalized) (M62.81) ?  ? ? ?Time: 1120-1200 ?PT Time Calculation (min) (ACUTE ONLY): 40 min ? ?Charges:  $Therapeutic Activity: 38-52 mins          ?          ? ?  Kreg Shropshire, DPT ?03/05/2022, 2:56 PM ? ?

## 2022-03-05 NOTE — Progress Notes (Signed)
OT Cancellation Note ? ?Patient Details ?Name: Joel Dominguez ?MRN: [id number] ?DOB: 03-13-1951 ? ? ?Cancelled Treatment:    Reason Eval/Treat Not Completed: Patient at procedure or test/ unavailable. Pt out of the room for a procedure/testing. Will re-attempt OT tx at later date/time as pt is available and medically appropriate.  ? ?Ardeth Perfect., MPH, MS, OTR/L ?ascom [phone] ?03/05/22, 1:37 PM ?

## 2022-03-05 NOTE — Progress Notes (Signed)
Subjective: ?Day of Surgery 03/03/2022 ?POV bilateral second digit amputation ?Surgeon: Dr. Boneta Lucks DPM ? ?Objective: ?Status post bilateral second digit amputations.  Patient feels well and has minimal pain to the toes.  Patient evaluated today with Dr. Delaine Lame, infectious disease ? ?Recent Labs  ?  03/03/22 ?2426  ?HGB 12.8*  ? ?Recent Labs  ?  03/03/22 ?8341  ?WBC 8.9  ?RBC 4.54  ?HCT 40.3  ?PLT 204  ? ?Recent Labs  ?  03/04/22 ?9622 03/05/22 ?0436  ?NA 136 136  ?K 3.9 3.7  ?CL 105 102  ?CO2 24 24  ?BUN 22 19  ?CREATININE 0.92 0.93  ?GLUCOSE 123* 128*  ?CALCIUM 8.3* 8.1*  ? ?No results for input(s): LABPT, INR in the last 72 hours. ? ? ? ? ? ?Skin incisions well coapted.  No active drainage or bleeding.  No malodor.  Sutures are intact.  Good routine healing noted ? ?Assessment/Plan: ?POV bilateral second digit amputations 03/03/2022 ?-Dressings changed.  CDI ?-From a surgical/podiatry standpoint patient okay to be discharged. ?-Planning for discharge to skilled nursing facility ?-Antibiotics as per ID upon discharge ?-Podiatry to sign off ? ?Edrick Kins, DPM ?[place] ? ?Dr. Edrick Kins, DPM  ?  ?[address]                                    ?[place], [place] [address]                ?Office [phone]  ?Fax [phone] ? ? ? ? ?Edrick Kins ?03/05/2022, 6:51 PM ? ?

## 2022-03-05 NOTE — Progress Notes (Addendum)
Transesophageal Echocardiogram : ? ?Indication: Bacteremia, rule out endocarditis ?Requesting/ordering  physician:  ? ?Procedure: Benzocaine spray x2 and 2 mls x 2 of viscous lidocaine were given orally to provide local anesthesia to the oropharynx. The patient was positioned supine on the left side, bite block provided. The patient was moderately sedated with the doses of versed and fentanyl as detailed below.  Using digital technique an omniplane probe was advanced into the distal esophagus without incident.  ? ?Moderate sedation: ?1. Sedation used:  Versed: 4 mg IV, Fentanyl: 100 mcg IV ?2. Time administered: 1240    time when patient started recovery: 105 ?Total sedation time 25 minutes ?3. I was face to face during this time ? ?See report in EPIC  for complete details: ?In brief,  ?No valve vegetation noted ?Moderate TR with moderately elevated right heart pressures ?Mild to moderate MR ?Mild to moderately calcified aortic valve with no significant stenosis ?Mildly dilated left atrium ?transgastric imaging revealed normal LV function with no RWMAs and no mural apical thrombus.  .  Estimated ejection fraction was 50%.  Right sided cardiac chambers were normal with evidence of moderately elevated right heart pressures consistent with pulmonary hypertension. ? ?Imaging of the septum showed no ASD or VSD ?Bubble study was negative for shunt ?2D and color flow confirmed no PFO ? ?The LA was well visualized in orthogonal views.  There was no spontaneous contrast and no thrombus in the LA and LA appendage  ? ?The descending thoracic aorta had no  mural aortic debris with no evidence of aneurysmal dilation or disection ? ? ?Ida Rogue ?03/05/2022 ?1:12 PM  ?

## 2022-03-05 NOTE — TOC Progression Note (Addendum)
Transition of Care (TOC) - Progression Note  ? ? ?Patient Details  ?Name: [name] ?MRN: [id number] ?Date of Birth: 1951-02-27 ? ?Transition of Care (TOC) CM/SW Contact  ?Candie Chroman, LCSW ?Phone Number: ?03/05/2022, 1:51 PM ? ?Clinical Narrative:  Per ID pharmacist, plan to discharge on IV abx. Specific antibiotic, duration, etc is pending TEE. Will send SNF referral back out once determined.  ? ?3:45 pm: Patient had 4 bed offers prior to knowing about IV abx: [place], [facility], and [facility]. [facility] is checking to see if they can still offer a bed. [facility] will discuss with DON in the morning. Left messages for admissions coordinators at [facility] and [facility]. ? ?4:27 pm: [facility] can still offer a bed. [facility] admissions coordinator will check and follow up. ? ?Expected Discharge Plan: [place] ?Barriers to Discharge: Continued Medical Work up ? ?Expected Discharge Plan and Services ?Expected Discharge Plan: [place] ?  ?  ?Post Acute Care Choice: [place] (vs [facility]) ?Living arrangements for the past 2 months: [place] ?                ?  ?  ?  ?  ?  ?  ?  ?  ?  ?  ? ? ?Social Determinants of Health (SDOH) Interventions ?  ? ?Readmission Risk Interventions ?   ? View : No data to display.  ?  ?  ?  ? ? ?

## 2022-03-06 ENCOUNTER — Inpatient Hospital Stay: Payer: Medicare Other

## 2022-03-06 ENCOUNTER — Inpatient Hospital Stay: Payer: Self-pay

## 2022-03-06 ENCOUNTER — Encounter: Payer: Self-pay | Admitting: Cardiovascular Disease

## 2022-03-06 LAB — BASIC METABOLIC PANEL
Anion gap: 7 (ref 5–15)
BUN: 16 mg/dL (ref 8–23)
CO2: 26 mmol/L (ref 22–32)
Calcium: 8.4 mg/dL — ABNORMAL LOW (ref 8.9–10.3)
Chloride: 105 mmol/L (ref 98–111)
Creatinine, Ser: 0.78 mg/dL (ref 0.61–1.24)
GFR, Estimated: >60 mL/min (ref >60)
Glucose, Bld: 119 mg/dL — ABNORMAL HIGH (ref 70–99)
Potassium: 3.7 mmol/L (ref 3.5–5.1)
Sodium: 138 mmol/L (ref 135–145)

## 2022-03-06 LAB — SURGICAL PATHOLOGY

## 2022-03-06 MED ORDER — SODIUM CHLORIDE 0.9% FLUSH
10.0000 mL | Freq: Two times a day (BID) | INTRAVENOUS | Status: DC
  Administered 2022-03-06: 10 mL

## 2022-03-06 MED ORDER — KETOROLAC TROMETHAMINE 15 MG/ML IJ SOLN
15.0000 mg | Freq: Once | INTRAMUSCULAR | Status: AC
  Administered 2022-03-06: 15 mg via INTRAVENOUS
  Filled 2022-03-06: qty 1

## 2022-03-06 MED ORDER — MAGNESIUM HYDROXIDE 400 MG/5ML PO SUSP
30.0000 mL | Freq: Once | ORAL | Status: AC
  Administered 2022-03-06: 30 mL via ORAL
  Filled 2022-03-06: qty 30

## 2022-03-06 MED ORDER — CHLORHEXIDINE GLUCONATE CLOTH 2 % EX PADS
6.0000 | MEDICATED_PAD | Freq: Every day | CUTANEOUS | Status: DC
  Administered 2022-03-07: 6 via TOPICAL

## 2022-03-06 MED ORDER — SODIUM CHLORIDE 0.9% FLUSH: 10.0000 mL | INTRAVENOUS | Status: DC | PRN

## 2022-03-06 NOTE — TOC Progression Note (Addendum)
Transition of Care (TOC) - Progression Note  ? ? ?Patient Details  ?Name: [name] ?MRN: [id number] ?Date of Birth: 07/03/1951 ? ?Transition of Care (TOC) CM/SW Contact  ?Candie Chroman, LCSW ?Phone Number: ?03/06/2022, 10:05 AM ? ?Clinical Narrative:   Went to room to give bed offers. Patient wants to wait on his wife to review and said she is on the way. Gave CMS list and marks which ones had offered, declined, and had not responded yet. ? ?12:43 pm: Patient and wife have accepted bed offer from [facility]. Uploaded clinicals into [place] portal to start insurance authorization. ? ?4:03 pm: [facility] has rescinded their bed offer. Patient in room getting PICC line. Called wife. She accepted bed offer from [facility]. Admissions coordinator is aware. Will assign auth to their facility. ? ?4:25 pm: Had to start new auth for [facility]. ? ?Expected Discharge Plan: [place] ?Barriers to Discharge: Continued Medical Work up ? ?Expected Discharge Plan and Services ?Expected Discharge Plan: [place] ?  ?  ?Post Acute Care Choice: [place] (vs [facility]) ?Living arrangements for the past 2 months: [place] ?                ?  ?  ?  ?  ?  ?  ?  ?  ?  ?  ? ? ?Social Determinants of Health (SDOH) Interventions ?  ? ?Readmission Risk Interventions ?   ? View : No data to display.  ?  ?  ?  ? ? ?

## 2022-03-06 NOTE — Progress Notes (Signed)
Mobility Specialist - Progress Note ? ? 03/06/22 1100  ?Mobility  ?Activity Stood at bedside;Dangled on edge of bed  ?Level of Assistance Moderate assist, patient does 50-74%  ?Activity Response Tolerated well  ?$Mobility charge 1 Mobility  ? ? ? ?Pt dangling EOB on arrival, utilizing RA. NT in room providing bed linen change. Boots donned. Pt able to come into slight standing and scoot transfer towards [facility] with trendelenburg features and +2. Assist on LE to return supine. Increased back pain with transfer to supine--repositioned for comfort. Pt left in bed with alarm set and needs in reach.   ? ? ?Kathee Delton ?Mobility Specialist ?03/06/22, 12:26 PM ? ?

## 2022-03-06 NOTE — Hospital Course (Addendum)
I haveTaken from prior notes. ? ?Joel Dominguez is a 71 y.o. male with medical history significant for chronic A-fib on anticoagulation with Eliquis, mild aortic stenosis, chronic combined systolic and diastolic dysfunction CHF with last known LVEF of 40 to 45% from a 2D echocardiogram which was done 04/23, spinal stenosis, right foot drop due to lumbar spinal stenosis, hypertension, morbid obesity (BMI 42) who presents to the ER for evaluation of 2 days of worsening low back pain. ?Patient has a history of chronic low back pain which got worse over the last 2 days. ?Per patient's wife who provides most of the history he was working in the yard 2 days prior to admission and had to bend over to pull some weeds.  He subsequently developed pain in his lower back and had tried over-the-counter pain medication as well as prescription meds without any relief.  He tried to get around the house with a rolling walker but over the last 12 hours has been unable to ambulate due to worsening pain that he rates 10 x 10 in intensity at its worst.  Any movement of his legs causes worsening of his back pain.  He denies having any urinary/fecal retention or incontinence and denies having any saddle anesthesia. ?He was recently discharged from the hospital and evaluated for sepsis from right leg cellulitis. ?He denies having any cough, no shortness of breath, no headache, no dizziness, no lightheadedness, no nausea, no vomiting, no changes in his bowel habits, no focal deficits or blurred vision. ?He had a fever upon arrival to the ER with a Tmax of 100.4 and is tachycardic. ? ?Found to have Enterococcus bacteremia and bilateral second toe osteomyelitis s/p amputation on 03/03/2022.  TEE was negative for any vegetations. ?Infectious disease was consulted and they are planning to continue IV ampicillin 2 g every 4 hours for 6 weeks, can be done as a continuous infusion of 12 g in 24 hours.  End date will be 86/10/23. ?Patient will need  weekly CBC with differential, CMP and ESR. ?Please pull the PICC line on completion of IV antibiotics. ?Patient will follow-up with Dr. Delaine Lame on 03/26/2022 at 9:15 AM. ? ?It is interesting to note that in May 2021 patient had endophthalmitis left eye in which was culture was positive for Enterococcus faecalis.  Apparently he was getting injections in his left eye prior to that.   ? ?PT/OT recommended SNF, patient is being discharged to rehab for further management. ? ?He will continue the rest of his home medications and follow-up with his providers. ?

## 2022-03-06 NOTE — Progress Notes (Addendum)
PHARMACY CONSULT NOTE FOR: ? ?OUTPATIENT  PARENTERAL ANTIBIOTIC THERAPY (OPAT) ? ?Indication: E faecalis bacteremia ?Regimen: Ampicillin 2gm IV q4h ?Alternatively may ampicillin infuse 12gm IV as continuous infusion over 24h ?End date: 04/13/2022 ? ?IV antibiotic discharge orders are pended. ?To discharging provider:  please sign these orders via discharge navigator,  ?Select New Orders & click on the button choice - Manage This Unsigned Work.  ?  ? ?Thank you for allowing pharmacy to be a part of this patient's care. ? ?Doreene Eland, PharmD, BCPS, BCIDP ?Work Cell: [phone] ?03/06/2022 3:56 PM ? ? ?

## 2022-03-06 NOTE — Progress Notes (Signed)
Occupational Therapy Treatment ?Patient Details ?Name: Joel Dominguez ?MRN: [id number] ?DOB: 05/18/1951 ?Today's Date: 03/06/2022 ? ? ?History of present illness Chuck Bossi is a 70yoM who comes to [facility] on 4/28 c acute on chronic back pain. PMH: AF on eliquis, aortic stenosis, combined CHF, spinal stenosis, Rt foot drop 2/2 spondylosis, HTN. Pt admitted with SIRS c fever, tachycardia, and leukocytosis. Imaging of spine did not show any discitis or osteo but has some paraspinous muscular inflammation. MRI of left foot did show acute osteo to distal phalanx of left second toe. Infectious work up revealed + blood cx for e.faecalis. Pt taken to OR 4/30 c Dr. Patel podiatry for bilat 2nd digit partial ray resection. Postoperatively pt is WBAT in postop shoes bilat. ?  ?OT comments ? Pt seen on this date, agreeable to cotx with OT/PT. Tx session targeted improving activity tolerance and progressing functional mobility/independence in ADL tasks to facilitate return to PLOF. Pt with R AFO +surgical boot, L surgical boot for mobility. Pt with improved tolerance and performance throughout all mobility and ADL tasks on this date. Ongoing education provided re: importance of oob mobility, discharge recommendations, safe ADL completion, body mechanics. Pt will continue to benefit from ongoing OT to address functional deficits. Pt is left in bedside chair, NAD, all needs met.   ? ?Recommendations for follow up therapy are one component of a multi-disciplinary discharge planning process, led by the attending physician.  Recommendations may be updated based on patient status, additional functional criteria and insurance authorization. ?   ?Follow Up Recommendations ? Skilled nursing-short term rehab (<3 hours/day)  ?  ?Assistance Recommended at Discharge Frequent or constant Supervision/Assistance  ?Patient can return home with the following ? A lot of help with walking and/or transfers;A lot of help with  bathing/dressing/bathroom;Assistance with cooking/housework;Help with stairs or ramp for entrance;Assist for transportation ?  ?Equipment Recommendations ? Other (comment) (per next venue of care)  ?  ?Recommendations for Other Services   ? ?  ?Precautions / Restrictions Precautions ?Precautions: Fall ?Required Braces or Orthoses:  (AFO RLE. per Pod. Use AFO versus CAM boot) ?Restrictions ?Weight Bearing Restrictions: Yes ?RLE Weight Bearing: Weight bearing as tolerated ?LLE Weight Bearing: Weight bearing as tolerated ?Other Position/Activity Restrictions: with bilateral post op shoes donned  ? ? ?  ? ?Mobility Bed Mobility ?Overal bed mobility: Needs Assistance ?Bed Mobility: Supine to Sit, Sit to Supine ?  ?  ?Supine to sit: Min assist, Mod assist, +2 for safety/equipment, HOB elevated ?Sit to supine: Min assist ?  ?General bed mobility comments: Attempted log roll technique, pt requires max multi modal cueing to attempt to complete. ?  ? ?Transfers ?Overall transfer level: Needs assistance ?Equipment used: Rolling walker (2 wheels) ?Transfers: Sit to/from Stand ?Sit to Stand: Min assist, +2 safety/equipment, From elevated surface ?  ?  ?  ?  ?  ?  ?  ?  ?Balance Overall balance assessment: Needs assistance ?Sitting-balance support: Feet supported, Single extremity supported ?Sitting balance-Leahy Scale: Good ?  ?  ?Standing balance support: Bilateral upper extremity supported ?Standing balance-Leahy Scale: Fair ?  ?  ?  ?  ?  ?  ?  ?  ?  ?  ?  ?  ?   ? ?ADL either performed or assessed with clinical judgement  ? ?ADL Overall ADL's : Needs assistance/impaired ?Eating/Feeding: Sitting;Set up ?  ?Grooming: Wash/dry face;Sitting;Set up ?  ?Upper Body Bathing: Set up;Sitting ?  ?Lower Body Bathing: Maximal assistance ?Lower Body Bathing Details (  indicate cue type and reason): anticipate ?Upper Body Dressing : Maximal assistance ?  ?Lower Body Dressing: Maximal assistance ?Lower Body Dressing Details (indicate cue type  and reason): boots ?Toilet Transfer: Minimal assistance;Min guard;+2 for physical assistance;Rolling walker (2 wheels) ?Toilet Transfer Details (indicate cue type and reason): simulated ?  ?  ?  ?  ?Functional mobility during ADLs: Min guard;Minimal assistance;+2 for safety/equipment;Rolling walker (2 wheels) ?  ?  ? ?Extremity/Trunk Assessment   ?  ?  ?  ?  ?  ? ?Vision   ?  ?  ?Perception   ?  ?Praxis   ?  ? ?Cognition Arousal/Alertness: Awake/alert ?Behavior During Therapy: [facility] for tasks assessed/performed ?Overall Cognitive Status: Within Functional Limits for tasks assessed ?  ?  ?  ?  ?  ?  ?  ?  ?  ?  ?  ?  ?  ?  ?  ?  ?General Comments: pt is alert and oriented x4 ?  ?  ?   ?Exercises   ? ?  ?Shoulder Instructions   ? ? ?  ?General Comments    ? ? ?Pertinent Vitals/ Pain       Pain Assessment ?Pain Assessment: 0-10 ?Pain Score: 8  ?Pain Location: back pain, increases significantly with movement/WBing ?Pain Descriptors / Indicators: Aching, Crying, Grimacing, Guarding, Jabbing, Spasm, Shooting ?Pain Intervention(s): Limited activity within patient's tolerance, Monitored during session, Premedicated before session, Repositioned ? ?Home Living   ?  ?  ?  ?  ?  ?  ?  ?  ?  ?  ?  ?  ?  ?  ?  ?  ?  ?  ? ?  ?Prior Functioning/Environment    ?  ?  ?  ?   ? ?Frequency ? Min 2X/week  ? ? ? ? ?  ?Progress Toward Goals ? ?OT Goals(current goals can now be found in the care plan section) ? Progress towards OT goals: Progressing toward goals ? ?Acute Rehab OT Goals ?Patient Stated Goal: go home ?OT Goal Formulation: With patient ?Time For Goal Achievement: 03/20/22 ?Potential to Achieve Goals: Good  ?Plan Discharge plan remains appropriate   ? ?Co-evaluation ? ? ?   ?  ?  ?  ?  ? ?  ?AM-PAC OT "6 Clicks" Daily Activity     ?Outcome Measure ? ? Help from another person eating meals?: None ?Help from another person taking care of personal grooming?: None ?Help from another person toileting, which includes using toliet,  bedpan, or urinal?: A Lot ?Help from another person bathing (including washing, rinsing, drying)?: A Lot ?Help from another person to put on and taking off regular upper body clothing?: A Little ?Help from another person to put on and taking off regular lower body clothing?: Total ?6 Click Score: 16 ? ?  ?End of Session Equipment Utilized During Treatment: Rolling walker (2 wheels) ? ?OT Visit Diagnosis: Unsteadiness on feet (R26.81);Repeated falls (R29.6);Muscle weakness (generalized) (M62.81);Pain ?  ?Activity Tolerance Patient tolerated treatment well ?  ?Patient Left in chair;with call bell/phone within reach;with chair alarm set;with family/visitor present ?  ?Nurse Communication Mobility status ?  ? ?   ? ?Time: 3568-6168 ?OT Time Calculation (min): 28 min ? ?Charges: OT General Charges ?$OT Visit: 1 Visit ?OT Treatments ?$Self Care/Home Management : 8-22 mins ? ?Shanon Payor, OTD OTR/L  ?03/06/22, 3:59 PM  ?

## 2022-03-06 NOTE — Progress Notes (Signed)
?Progress Note ? ? ?Patient: Joel Dominguez YQI:[id number] DOB: June 29, 1951 DOA: 03/01/2022     5 ?DOS: the patient was seen and examined on 03/06/2022 ?  ?Brief hospital course: ?Taken from prior notes. ? ?Savva Beamer is a 71 y.o. male with medical history significant for chronic A-fib on anticoagulation with Eliquis, mild aortic stenosis, chronic combined systolic and diastolic dysfunction CHF with last known LVEF of 40 to 45% from a 2D echocardiogram which was done 04/23, spinal stenosis, right foot drop due to lumbar spinal stenosis, hypertension, morbid obesity (BMI 42) who presents to the ER for evaluation of 2 days of worsening low back pain. ?Patient has a history of chronic low back pain which got worse over the last 2 days. ?Per patient's wife who provides most of the history he was working in the yard 2 days prior to admission and had to bend over to pull some weeds.  He subsequently developed pain in his lower back and had tried over-the-counter pain medication as well as prescription meds without any relief.  He tried to get around the house with a rolling walker but over the last 12 hours has been unable to ambulate due to worsening pain that he rates 10 x 10 in intensity at its worst.  Any movement of his legs causes worsening of his back pain.  He denies having any urinary/fecal retention or incontinence and denies having any saddle anesthesia. ?He was recently discharged from the hospital and evaluated for sepsis from right leg cellulitis. ?He denies having any cough, no shortness of breath, no headache, no dizziness, no lightheadedness, no nausea, no vomiting, no changes in his bowel habits, no focal deficits or blurred vision. ?He had a fever upon arrival to the ER with a Tmax of 100.4 and is tachycardic. ? ?Found to have Enterococcus bacteremia and bilateral second toe osteomyelitis s/p amputation on 03/03/2022.  TEE was negative for any vegetations.  Infectious disease was consulted and they are  planning to continue IV antibiotics for 6 weeks. ? ?PT/OT recommended SNF, insurance authorization came back later today, most likely will be leaving tomorrow. ? ? ?Assessment and Plan: ?Systemic inflammatory response syndrome (SIRS) (HCC) ?Patient meets sepsis criteria with fever, tachycardia and leukocytosis. ?Lactic acid is within normal limits and source of infection is unclear at this time. ?Concern for possible osteomyelitis involving the left foot versus an epidural abscess resulting in worsening low back pain. ?Patient already received empiric antibiotic therapy with vancomycin, cefepime and Flagyl. ?Blood cultures positive for Enterococcus bacteremia, TEE negative for endocarditis.  Repeat blood cultures done on 03/03/2022 negative so far.Marland Kitchen ?Also found to have bilateral second toe osteomyelitis s/p amputation. ?-Continue Unasyn for 6 weeks ?-PICC line placed today ? ? ?Acute exacerbation of chronic low back pain ?Patient has a history of chronic low back pain from lumbar spinal stenosis ?He presented in acute exacerbation of his chronic low back pain and is unable to move his extremities due to same.   ?He also has a fever and a white count concerning for possible infectious etiology as the cause of his pain. ?Follow-up results of MRI of lumbar spine ?Patient already received 1 dose of IV vancomycin, cefepime and Flagyl ?Pain control and muscle relaxant as needed ? ?Spinal stenosis, lumbar region, with neurogenic claudication ?Treatment as outlined in 2 ? ?Chronic combined systolic and diastolic CHF (congestive heart failure) ([place]) ?Patient's last known LVEF is 40 to 45% with mild aortic stenosis from a 2D echocardiogram which was done 04/23 ?Continue  GDMT with Bumex, Farxiga carvedilol, Entresto and spironolactone. ?Maintain low-sodium diet ? ?Longstanding persistent atrial fibrillation ([place]) ?Continue carvedilol for rate control ?Continue Eliquis as primary prophylaxis for an acute stroke ? ?Obesity, Class  III, BMI 40-49.9 (morbid obesity) ([place]) ?Complicates overall prognosis and care ?Lifestyle modification and exercise discussed with patient ? ?  ? ?Subjective: Patient was seen and examined today.  Pain improving.  Wife at bedside. ? ?Physical Exam: ?Vitals:  ? 03/06/22 0524 03/06/22 0529 03/06/22 0734 03/06/22 1534  ?BP: (!) 168/85 (!) 147/92 (!) 156/84 134/69  ?Pulse: 86 81 84 91  ?Resp: '16  19 16  '$ ?Temp:   98.3 ?F (36.8 ?C) 98.4 ?F (36.9 ?C)  ?TempSrc:   Oral Oral  ?SpO2: 94% 96% 94% 95%  ?Weight:      ?Height:      ? ?General.     In no acute distress. ?Pulmonary.  Lungs clear bilaterally, normal respiratory effort. ?CV.  Regular rate and rhythm, no JVD, rub or murmur. ?Abdomen.  Soft, nontender, nondistended, BS positive. ?CNS.  Alert and oriented .  No focal neurologic deficit. ?Extremities.  No edema, no cyanosis, pulses intact and symmetrical.  Bilateral feet with Ace wrap and clean bandage. ?Psychiatry.  Judgment and insight appears normal. ? ?Data Reviewed: ?Prior notes, labs and images reviewed ? ?Family Communication: Discussed with wife at bedside ? ?Disposition: ?Status is: Inpatient ?Remains inpatient appropriate because: Severity of illness ? ? Planned Discharge Destination: Skilled nursing facility ? ?DVT prophylaxis.  Eliquis ?Time spent: 40 minutes ? ?This record has been created using Systems analyst. Errors have been sought and corrected,but may not always be located. Such creation errors do not reflect on the standard of care. ? ?Author: ?Lorella Nimrod, MD ?03/06/2022 6:17 PM ? ?For on call review [email or web address].  ?

## 2022-03-06 NOTE — Progress Notes (Addendum)
? ?Date of Admission:  03/01/2022    ?ID: Joel Dominguez is a 71 y.o. male  ?Principal Problem: ?  Sepsis ([place]) ?Active Problems: ?  Chronic combined systolic and diastolic CHF (congestive heart failure) ([place]) ?  Obesity, Class III, BMI 40-49.9 (morbid obesity) ([place]) ?  Acute exacerbation of chronic low back pain ?  Longstanding persistent atrial fibrillation ([place]) ?  Spinal stenosis, lumbar region, with neurogenic claudication ?  Systemic inflammatory response syndrome (SIRS) (HCC) ? ? ?Wife at bedside ? ?Subjective: ?Took Few steps with PT ?Feeling better ? ? ?Medications:  ? apixaban  5 mg Oral BID  ? B-complex with vitamin C  1 tablet Oral Daily  ? carvedilol  12.5 mg Oral BID WC  ? magnesium hydroxide  30 mL Oral Once  ? multivitamin with minerals  1 tablet Oral Daily  ? polyethylene glycol  17 g Oral Daily  ? senna  1 tablet Oral Daily  ? sodium chloride flush  3 mL Intravenous Q12H  ? [START ON 03/08/2022] Vitamin D (Ergocalciferol)  50,000 Units Oral Q7 days  ? ? ?Objective: ?Vital signs in last 24 hours: ?Temp:  [97.9 ?F (36.6 ?C)-98.8 ?F (37.1 ?C)] 98.3 ?F (36.8 ?C) (05/03 0734) ?Pulse Rate:  [81-95] 84 (05/03 0734) ?Resp:  [11-21] 19 (05/03 0734) ?BP: (120-168)/(60-92) 156/84 (05/03 0734) ?SpO2:  [90 %-96 %] 94 % (05/03 0734) ?Weight:  [140.6 kg] 140.6 kg (05/02 1226) ? ?PHYSICAL EXAM:  ?General: Alert, cooperative, morbid obesity ?Lungs: Bilateral air entry ?Heart: Regular rate and rhythm, no murmur, rub or gallop. ?Abdomen: Soft, non-tender,not distended. Bowel sounds normal. No masses ?Extremities: Surgical dressing removed ?Both sites of partial amputation second toes looking well.  No discharge no erythema  ? ? ? ? ?skin: No rashes or lesions. Or bruising ?Lymph: Cervical, supraclavicular normal. ?Neurologic: Grossly non-focal except ?Rt foot drop ?Lab Results ?Recent Labs  ?  03/05/22 ?0436 03/06/22 ?1751  ?NA 136 138  ?K 3.7 3.7  ?CL 102 105  ?CO2 24 26  ?BUN 19 16  ?CREATININE 0.93 0.78   ? ? ? ?Microbiology: ?03/01/22- Enterococcus ?03/03/22- BC- NG ?Studies/Results: ?ECHO TEE ? ?Result Date: 03/05/2022 ?   TRANSESOPHOGEAL ECHO REPORT   Patient Name:   Joel Dominguez Date of Exam: 03/05/2022 Medical Rec #:  [id number]      Height:       72.0 in Accession #:    [id number]     Weight:       310.0 lb Date of Birth:  1951-10-01       BSA:          2.567 m? Patient Age:    46 years       BP:           120/79 mmHg Patient Gender: M              HR:           86 bpm. Exam Location:  [facility] Procedure: Transesophageal Echo, Color Doppler, Cardiac Doppler and Saline            Contrast Bubble Study Indications:     Not listed on TEE check-in sheet  History:         Patient has prior history of Echocardiogram examinations, most                  recent 03/03/2022. CHF, Arrythmias:Atrial Fibrillation; Risk  Factors:Hypertension.  Sonographer:     Sherrie Sport Referring Phys:  [id number] Kate Sable Diagnosing Phys: Ida Rogue MD PROCEDURE: After discussion of the risks and benefits of a TEE, an informed consent was obtained from the patient. TEE procedure time was 25 minutes. The transesophogeal probe was passed without difficulty through the esophogus of the patient. Imaged were obtained with the patient in a left lateral decubitus position. Local oropharyngeal anesthetic was provided with Benzocaine spray and viscous lidocaine. Sedation performed by performing physician. Patients was under conscious sedation during this procedure. Anesthetic administered: 19mg of Fentanyl, 4.'0mg'$  of Versed. Image quality was excellent. The patient's vital signs; including heart rate, blood pressure, and oxygen saturation; remained stable throughout the procedure. The patient developed no complications during the procedure. IMPRESSIONS  1. No valve vegetation noted.  2. Left ventricular ejection fraction, by estimation, is 55 to 60%. The left ventricle has normal function. The left ventricle has no regional wall  motion abnormalities.  3. Right ventricular systolic function is normal. The right ventricular size is mildly enlarged. There is moderately elevated pulmonary artery systolic pressure. The estimated right ventricular systolic pressure is 594.1mmHg.  4. Left atrial size was moderately dilated. No left atrial/left atrial appendage thrombus was detected.  5. The mitral valve is normal in structure. Mild to moderate mitral valve regurgitation. No evidence of mitral stenosis.  6. Tricuspid valve regurgitation is moderate.  7. The aortic valve is normal in structure. There is mild calcification of the aortic valve. Aortic valve regurgitation is not visualized. Aortic valve sclerosis/calcification is present, without any evidence of aortic stenosis.  8. The inferior vena cava is normal in size with greater than 50% respiratory variability, suggesting right atrial pressure of 3 mmHg.  9. Agitated saline contrast bubble study was negative, with no evidence of any interatrial shunt. 10. There is mild (Grade II) atheroma plaque involving the aortic arch. Conclusion(s)/Recommendation(s): Normal biventricular function without evidence of hemodynamically significant valvular heart disease. FINDINGS  Left Ventricle: Left ventricular ejection fraction, by estimation, is 55 to 60%. The left ventricle has normal function. The left ventricle has no regional wall motion abnormalities. The left ventricular internal cavity size was normal in size. There is  no left ventricular hypertrophy. Right Ventricle: The right ventricular size is mildly enlarged. No increase in right ventricular wall thickness. Right ventricular systolic function is normal. There is moderately elevated pulmonary artery systolic pressure. The tricuspid regurgitant velocity is 3.50 m/s, and with an assumed right atrial pressure of 5 mmHg, the estimated right ventricular systolic pressure is 574.0mmHg. Left Atrium: Left atrial size was moderately dilated. No left  atrial/left atrial appendage thrombus was detected. Right Atrium: Right atrial size was normal in size. Pericardium: There is no evidence of pericardial effusion. Mitral Valve: The mitral valve is normal in structure. Mild to moderate mitral valve regurgitation. No evidence of mitral valve stenosis. Tricuspid Valve: The tricuspid valve is normal in structure. Tricuspid valve regurgitation is moderate . No evidence of tricuspid stenosis. Aortic Valve: The aortic valve is normal in structure. There is mild calcification of the aortic valve. Aortic valve regurgitation is not visualized. Aortic valve sclerosis/calcification is present, without any evidence of aortic stenosis. Pulmonic Valve: The pulmonic valve was normal in structure. Pulmonic valve regurgitation is not visualized. No evidence of pulmonic stenosis. Aorta: The aortic root is normal in size and structure. There is mild (Grade II) atheroma plaque involving the aortic arch. Venous: The inferior vena cava is normal in size with  greater than 50% respiratory variability, suggesting right atrial pressure of 3 mmHg. IAS/Shunts: No atrial level shunt detected by color flow Doppler. Agitated saline contrast was given intravenously to evaluate for intracardiac shunting. Agitated saline contrast bubble study was negative, with no evidence of any interatrial shunt.  TRICUSPID VALVE TR Peak grad:   49.0 mmHg TR Vmax:        350.00 cm/s Ida Rogue MD Electronically signed by Ida Rogue MD Signature Date/Time: 03/05/2022/5:51:00 PM    Final   ? ?[place] EKG SITE RITE ? ?Result Date: 03/06/2022 ?If [employer] not attached, placement could not be confirmed due to current cardiac rhythm.  ? ? ?Assessment/Plan: ?Enterococcus faecalis bacteremia.  Possible source of the toe ulcers but no cultures were sent.  TEE no evidence of endocarditis ?Repeat blood cultures negative so far ?It is interesting to note that in May 2021 patient had endophthalmitis left eye in which was  culture was positive for Enterococcus faecalis.  Apparently he was getting injections in his left eye prior to that.  Plan would be to give him  6 weeks of IV antibiotics. ?PICC can be placed ? ?History of lumbar spinal

## 2022-03-06 NOTE — Progress Notes (Signed)
Peripherally Inserted Central Catheter Placement ? ?The IV Nurse has discussed with the patient and/or persons authorized to consent for the patient, the purpose of this procedure and the potential benefits and risks involved with this procedure.  The benefits include less needle sticks, lab draws from the catheter, and the patient may be discharged home with the catheter. Risks include, but not limited to, infection, bleeding, blood clot (thrombus formation), and puncture of an artery; nerve damage and irregular heartbeat and possibility to perform a PICC exchange if needed/ordered by physician.  Alternatives to this procedure were also discussed.  Bard Power PICC patient education guide, fact sheet on infection prevention and patient information card has been provided to patient /or left at bedside.   ? ?PICC Placement Documentation  ?PICC Single Lumen 32/99/24 Right Basilic 45 cm 0 cm (Active)  ?Indication for Insertion or Continuance of Line Prolonged intravenous therapies 03/06/22 1620  ?Exposed Catheter (cm) 0 cm 03/06/22 1620  ?Site Assessment Clean, Dry, Intact 03/06/22 1620  ?Line Status Flushed;Saline locked;Blood return noted 03/06/22 1620  ?Dressing Type Securing device;Transparent 03/06/22 1620  ?Dressing Status Antimicrobial disc in place;Clean, Dry, Intact 03/06/22 1620  ?Dressing Intervention New dressing;Other (Comment) 03/06/22 1620  ?Dressing Change Due 03/13/22 03/06/22 1620  ? ? ? ? ? ?Christella Noa Albarece ?03/06/2022, 4:53 PM ? ?

## 2022-03-06 NOTE — Progress Notes (Signed)
Physical Therapy Treatment ?Patient Details ?Name: Joel Dominguez ?MRN: [id number] ?DOB: 11/27/1950 ?Today's Date: 03/06/2022 ? ? ?History of Present Illness Joel Dominguez is a 29yoM who comes to [facility] on 4/28 c acute on chronic back pain. PMH: AF on eliquis, aortic stenosis, combined CHF, spinal stenosis, Rt foot drop 2/2 spondylosis, HTN. Pt admitted with SIRS c fever, tachycardia, and leukocytosis. Imaging of spine did not show any discitis or osteo but has some paraspinous muscular inflammation. MRI of left foot did show acute osteo to distal phalanx of left second toe. Infectious work up revealed + blood cx for e.faecalis. Pt taken to OR 4/30 c Dr. Posey Pronto podiatry for bilat 2nd digit partial ray resection. Postoperatively pt is WBAT in postop shoes bilat. ? ?  ?PT Comments  ? ? PT/OT co treat 2/2 to pt's limited activity tolerance an requiring +2 assistance for safety prior to this session. Next session will not require +2 assist. He was able to progress towards PT goals and even able to take a few steps. He used LLE post op shoe and RLE AFO+ shoe. He needs increased time to perform bed mobility, transfers, and gait due to pain. Acute PT will continue to follow and progress as able per current POC.  ?  ?Recommendations for follow up therapy are one component of a multi-disciplinary discharge planning process, led by the attending physician.  Recommendations may be updated based on patient status, additional functional criteria and insurance authorization. ? ?Follow Up Recommendations ? Skilled nursing-short term rehab (<3 hours/day) ?  ?  ?Assistance Recommended at Discharge Frequent or constant Supervision/Assistance  ?Patient can return home with the following A lot of help with walking and/or transfers;A lot of help with bathing/dressing/bathroom;Assistance with cooking/housework;Assistance with feeding;Assist for transportation;Help with stairs or ramp for entrance;Direct supervision/assist for medications  management ?  ?Equipment Recommendations ? Other (comment) (defer to next level of care)  ?  ?   ?Precautions / Restrictions Precautions ?Precautions: Fall ?Required Braces or Orthoses:  (AFO RLE. per Pod. Use AFO versus CAM boot) ?Restrictions ?Weight Bearing Restrictions: Yes ?RLE Weight Bearing: Weight bearing as tolerated ?LLE Weight Bearing: Weight bearing as tolerated  ?  ? ?Mobility ? Bed Mobility ?Overal bed mobility: Needs Assistance ?Bed Mobility: Supine to Sit, Sit to Supine ?  ?  ?Supine to sit: Min assist, Mod assist, +2 for safety/equipment, HOB elevated ?  ?  ?General bed mobility comments: PTA/OT attempted to encourage log roll techniqu however pt unwilling. He was able to achieve sitting EOB with increased time and vcs for technique throughout, min-mod assist to achieve EOB short sit ?  ? ?Transfers ?Overall transfer level: Needs assistance ?Equipment used: Rolling walker (2 wheels) ?Transfers: Sit to/from Stand ?Sit to Stand: Min assist, +2 safety/equipment, From elevated surface, Mod assist ?  ?  ?  ?  ?  ?General transfer comment: min assist to stand from EOB 2 x with bed height elevated. mod assist of one to stand from lower recliner height surface. ?  ? ?Ambulation/Gait ?Ambulation/Gait assistance: Min assist, +2 safety/equipment (chair follow) ?Gait Distance (Feet): 4 Feet ?Assistive device: Rolling walker (2 wheels) (Bariatric) ?Gait Pattern/deviations: Step-to pattern, Antalgic ?Gait velocity: decreased ?  ?  ?General Gait Details: Pt was able to ambulate ~ 4 ft with bariatric RW + recliner follow for safety. distance limited by pain moreso than strength ot fatigue. ? ?  ?Balance Overall balance assessment: Needs assistance ?Sitting-balance support: Feet supported, Single extremity supported ?Sitting balance-Leahy Scale: Good ?  ?  ?  Standing balance support: Bilateral upper extremity supported ?Standing balance-Leahy Scale: Fair ?  ?  ?  ?Cognition Arousal/Alertness: Awake/alert ?Behavior  During Therapy: [facility] for tasks assessed/performed ?Overall Cognitive Status: Within Functional Limits for tasks assessed ?  ?  ?   ?General Comments: Pt is A and O x 4 with supportive spouse present throughout ?  ?  ? ?  ?   ?   ? ?Pertinent Vitals/Pain Pain Assessment ?Pain Assessment: 0-10 ?Pain Score: 8  ?Faces Pain Scale: Hurts whole lot ?Pain Location: back pain, increases significantly with movement/WBing ?Pain Descriptors / Indicators: Aching, Crying, Grimacing, Guarding, Jabbing, Spasm, Shooting ?Pain Intervention(s): Limited activity within patient's tolerance, Monitored during session, Repositioned, Premedicated before session  ? ? ? ?PT Goals (current goals can now be found in the care plan section) Acute Rehab PT Goals ?Patient Stated Goal: return to PLOF ?Progress towards PT goals: Progressing toward goals ? ?  ?Frequency ? ? ? 7X/week ? ? ? ?  ?PT Plan Current plan remains appropriate  ? ? ?   ?AM-PAC PT "6 Clicks" Mobility   ?Outcome Measure ? Help needed turning from your back to your side while in a flat bed without using bedrails?: A Little ?Help needed moving from lying on your back to sitting on the side of a flat bed without using bedrails?: A Lot ?Help needed moving to and from a bed to a chair (including a wheelchair)?: A Lot ?Help needed standing up from a chair using your arms (e.g., wheelchair or bedside chair)?: A Lot ?Help needed to walk in hospital room?: A Lot ?Help needed climbing 3-5 steps with a railing? : Total ?6 Click Score: 12 ? ?  ?End of Session   ?Activity Tolerance: Patient tolerated treatment well;Patient limited by pain ?Patient left: in chair;with call bell/phone within reach;with chair alarm set ?Nurse Communication: Mobility status ?PT Visit Diagnosis: Other abnormalities of gait and mobility (R26.89);Difficulty in walking, not elsewhere classified (R26.2);Muscle weakness (generalized) (M62.81) ?  ? ? ?Time: 3335-4562 ?PT Time Calculation (min) (ACUTE ONLY): 24  min ? ?Charges:  $Therapeutic Activity: 8-22 mins          ?          ? ?Julaine Fusi PTA ?03/06/22, 3:29 PM  ? ?

## 2022-03-07 DIAGNOSIS — M869 Osteomyelitis, unspecified: Secondary | ICD-10-CM

## 2022-03-07 DIAGNOSIS — I5042 Chronic combined systolic (congestive) and diastolic (congestive) heart failure: Secondary | ICD-10-CM

## 2022-03-07 LAB — BASIC METABOLIC PANEL
Anion gap: 6 (ref 5–15)
BUN: 18 mg/dL (ref 8–23)
CO2: 28 mmol/L (ref 22–32)
Calcium: 8.2 mg/dL — ABNORMAL LOW (ref 8.9–10.3)
Chloride: 102 mmol/L (ref 98–111)
Creatinine, Ser: 0.99 mg/dL (ref 0.61–1.24)
GFR, Estimated: >60 mL/min (ref >60)
Glucose, Bld: 114 mg/dL — ABNORMAL HIGH (ref 70–99)
Potassium: 3.8 mmol/L (ref 3.5–5.1)
Sodium: 136 mmol/L (ref 135–145)

## 2022-03-07 MED ORDER — CYCLOBENZAPRINE HCL 10 MG PO TABS
10.0000 mg | ORAL_TABLET | Freq: Three times a day (TID) | ORAL | 0 refills | Status: DC | PRN
Start: 2022-03-07 — End: 2022-03-20

## 2022-03-07 MED ORDER — HYDROCODONE-ACETAMINOPHEN 10-325 MG PO TABS: 1.0000 | ORAL_TABLET | Freq: Three times a day (TID) | ORAL | 0 refills | Status: DC | PRN

## 2022-03-07 MED ORDER — AMPICILLIN IV (FOR PTA / DISCHARGE USE ONLY): 2.0000 g | INTRAVENOUS | 0 refills | Status: AC

## 2022-03-07 MED ORDER — POLYETHYLENE GLYCOL 3350 17 G PO PACK
17.0000 g | PACK | Freq: Every day | ORAL | 0 refills | Status: DC
Start: 2022-03-07 — End: 2022-04-15

## 2022-03-07 MED ORDER — BUMETANIDE 1 MG PO TABS: 1.0000 mg | ORAL_TABLET | Freq: Every day | ORAL | Status: DC

## 2022-03-07 MED ORDER — SPIRONOLACTONE 25 MG PO TABS
25.0000 mg | ORAL_TABLET | Freq: Every day | ORAL | Status: DC
  Administered 2022-03-07: 25 mg via ORAL
  Filled 2022-03-07: qty 1

## 2022-03-07 MED ORDER — SENNA 8.6 MG PO TABS: 1.0000 | ORAL_TABLET | Freq: Every day | ORAL | 0 refills | Status: DC

## 2022-03-07 NOTE — Discharge Summary (Signed)
?Physician Discharge Summary ?  ?Patient: Joel Dominguez MRN: [id number] DOB: June 19, 1951  ?Admit date:     03/01/2022  ?Discharge date: 03/07/22  ?Discharge Physician: Lorella Nimrod  ? ?PCP: Lorrene Reid, PA-C  ? ?Recommendations at discharge:  ?Please obtain weekly CBC with differential, CMP and ESR. ?Continue IV ampicillin every 4 hourly until 04/13/2022. ?Please remove PICC line after completion of antibiotic. ?Follow-up with infectious disease. ?Follow-up with podiatry-keep dressing intact and clean until that time, can be changed if gets soiled. ?Follow-up with primary care provider. ? ?Discharge Diagnoses: ?Principal Problem: ?  Sepsis ([place]) ?Active Problems: ?  Systemic inflammatory response syndrome (SIRS) (HCC) ?  Acute bilateral low back pain without sciatica ?  Spinal stenosis, lumbar region, with neurogenic claudication ?  Chronic combined systolic and diastolic CHF (congestive heart failure) ([place]) ?  Longstanding persistent atrial fibrillation ([place]) ?  Obesity, Class III, BMI 40-49.9 (morbid obesity) ([place]) ?  Toe osteomyelitis, left ([place]) ? ?Hospital Course: ?I haveTaken from prior notes. ? ?Joel Dominguez is a 71 y.o. male with medical history significant for chronic A-fib on anticoagulation with Eliquis, mild aortic stenosis, chronic combined systolic and diastolic dysfunction CHF with last known LVEF of 40 to 45% from a 2D echocardiogram which was done 04/23, spinal stenosis, right foot drop due to lumbar spinal stenosis, hypertension, morbid obesity (BMI 42) who presents to the ER for evaluation of 2 days of worsening low back pain. ?Patient has a history of chronic low back pain which got worse over the last 2 days. ?Per patient's wife who provides most of the history he was working in the yard 2 days prior to admission and had to bend over to pull some weeds.  He subsequently developed pain in his lower back and had tried over-the-counter pain medication as well as prescription meds without any  relief.  He tried to get around the house with a rolling walker but over the last 12 hours has been unable to ambulate due to worsening pain that he rates 10 x 10 in intensity at its worst.  Any movement of his legs causes worsening of his back pain.  He denies having any urinary/fecal retention or incontinence and denies having any saddle anesthesia. ?He was recently discharged from the hospital and evaluated for sepsis from right leg cellulitis. ?He denies having any cough, no shortness of breath, no headache, no dizziness, no lightheadedness, no nausea, no vomiting, no changes in his bowel habits, no focal deficits or blurred vision. ?He had a fever upon arrival to the ER with a Tmax of 100.4 and is tachycardic. ? ?Found to have Enterococcus bacteremia and bilateral second toe osteomyelitis s/p amputation on 03/03/2022.  TEE was negative for any vegetations. ?Infectious disease was consulted and they are planning to continue IV ampicillin 2 g every 4 hours for 6 weeks, can be done as a continuous infusion of 12 g in 24 hours.  End date will be 86/10/23. ?Patient will need weekly CBC with differential, CMP and ESR. ?Please pull the PICC line on completion of IV antibiotics. ?Patient will follow-up with Dr. Delaine Lame on 03/26/2022 at 9:15 AM. ? ?It is interesting to note that in May 2021 patient had endophthalmitis left eye in which was culture was positive for Enterococcus faecalis.  Apparently he was getting injections in his left eye prior to that.   ? ?PT/OT recommended SNF, patient is being discharged to rehab for further management. ? ?He will continue the rest of his home medications and follow-up  with his providers. ? ?Assessment and Plan: ?Systemic inflammatory response syndrome (SIRS) (HCC) ?Patient meets sepsis criteria with fever, tachycardia and leukocytosis. ?Lactic acid is within normal limits and source of infection is unclear at this time. ?Concern for possible osteomyelitis involving the left foot  versus an epidural abscess resulting in worsening low back pain. ?Patient already received empiric antibiotic therapy with vancomycin, cefepime and Flagyl. ?Blood cultures positive for Enterococcus bacteremia, TEE negative for endocarditis.  Repeat blood cultures done on 03/03/2022 negative so far.Marland Kitchen ?Also found to have bilateral second toe osteomyelitis s/p amputation. ?-Continue Unasyn for 6 weeks ?-PICC line placed today ? ? ?Acute bilateral low back pain without sciatica ?Patient has a history of chronic low back pain from lumbar spinal stenosis ?He presented in acute exacerbation of his chronic low back pain and is unable to move his extremities due to same.   ?He also has a fever and a white count concerning for possible infectious etiology as the cause of his pain. ?Follow-up results of MRI of lumbar spine ?Patient already received 1 dose of IV vancomycin, cefepime and Flagyl ?Pain control and muscle relaxant as needed ? ?Spinal stenosis, lumbar region, with neurogenic claudication ?Treatment as outlined in 2 ? ?Chronic combined systolic and diastolic CHF (congestive heart failure) ([place]) ?Patient's last known LVEF is 40 to 45% with mild aortic stenosis from a 2D echocardiogram which was done 04/23 ?Continue  with Bumex,  carvedilol, and spironolactone. ?Maintain low-sodium diet ? ?Longstanding persistent atrial fibrillation ([place]) ?Continue carvedilol for rate control ?Continue Eliquis as primary prophylaxis for an acute stroke ? ?Obesity, Class III, BMI 40-49.9 (morbid obesity) ([place]) ?Complicates overall prognosis and care ?Lifestyle modification and exercise discussed with patient ? ?  ? ?Pain control - [employer] Controlled Substance Reporting System database was reviewed. and patient was instructed, not to drive, operate heavy machinery, perform activities at heights, swimming or participation in water activities or provide baby-sitting services while on Pain, Sleep and Anxiety Medications; until their  outpatient Physician has advised to do so again. Also recommended to not to take more than prescribed Pain, Sleep and Anxiety Medications.  ?Consultants: Infectious disease, cardiology, podiatry ?Procedures performed: Bilateral second toe amputation, TEE ?Disposition: Skilled nursing facility ?Diet recommendation:  ?Cardiac diet ?DISCHARGE MEDICATION: ?Allergies as of 03/07/2022   ? ?   Reactions  ? Cephalexin Rash  ? Dilaudid [hydromorphone] Other (See Comments)  ? Hallucinations  ? ?  ? ?  ?Medication List  ?  ? ?STOP taking these medications   ? ?doxycycline 100 MG tablet ?Commonly known as: VIBRA-TABS ?  ? ?  ? ?TAKE these medications   ? ?ampicillin  IVPB ?Inject 2 g into the vein every 4 (four) hours. Indication: E. Faecalis bacteremia ?Last Day of Therapy:  04/13/2022 ?Labs - Once weekly:  CBC/D, ESR and CMP ?Please pull PIC at completion of IV antibiotics ?Fax weekly labs to [phone] ?Alternative dosing is ampicillin 12gm as a continuous infusion over 24h. ?  ?apixaban 5 MG Tabs tablet ?Commonly known as: Eliquis ?Take 1 tablet (5 mg total) by mouth 2 (two) times daily. ?  ?B-complex with vitamin C tablet ?Take 1 tablet by mouth daily. ?  ?bumetanide 1 MG tablet ?Commonly known as: BUMEX ?TAKE 1 TABLET BY MOUTH EVERY DAY AND TAKE AN ADDITIONAL 1 TABLET ON MONDAY, [place] ?  ?carvedilol 12.5 MG tablet ?Commonly known as: COREG ?TAKE 1 TABLET (12.5 MG TOTAL) BY MOUTH 2 (TWO) TIMES DAILY WITH A MEAL. ?  ?  cyclobenzaprine 10 MG tablet ?Commonly known as: FLEXERIL ?Take 1 tablet (10 mg total) by mouth 3 (three) times daily as needed for muscle spasms. ?  ?hydrocerin Crea ?Apply 1 application topically 2 (two) times daily. ?  ?HYDROcodone-acetaminophen 10-325 MG tablet ?Commonly known as: NORCO ?Take 1 tablet by mouth every 8 (eight) hours as needed for moderate pain. ?  ?multivitamin tablet ?Take 1 tablet by mouth daily. ?  ?polyethylene glycol 17 g packet ?Commonly known as: MIRALAX /  GLYCOLAX ?Take 17 g by mouth daily. ?  ?senna 8.6 MG Tabs tablet ?Commonly known as: SENOKOT ?Take 1 tablet (8.6 mg total) by mouth daily. ?  ?spironolactone 25 MG tablet ?Commonly known as: ALDACTONE ?TAKE 1 TABLET

## 2022-03-07 NOTE — Care Management Important Message (Signed)
Important Message ? ?Patient Details  ?Name: Joel Dominguez ?MRN: [id number] ?Date of Birth: 07/03/1951 ? ? ?Medicare Important Message Given:  Yes ? ? ? ? ?Dannette Barbara ?03/07/2022, 12:25 PM ?

## 2022-03-07 NOTE — TOC Transition Note (Signed)
Transition of Care (TOC) - CM/SW Discharge Note ? ? ?Patient Details  ?Name: Joel Dominguez ?MRN: [id number] ?Date of Birth: [birth year] ? ?Transition of Care (TOC) CM/SW Contact:  ?Candie Chroman, LCSW ?Phone Number: ?03/07/2022, 12:13 PM ? ? ?Clinical Narrative:   Patient has orders to discharge to [facility] SN[facility] today. RN has already called report. EMS transport has been arranged for 1:00. No further concerns. CSW signing off. ? ?Final next level of care: [place] ?Barriers to Discharge: Barriers Resolved ? ? ?Patient Goals and CMS Choice ?Patient states their goals for this hospitalization and ongoing recovery are:: To go home ?CMS Medicare.gov Compare Post Acute Care list provided to:: Patient ?Choice offered to / list presented to : Patient, Spouse ? ?Discharge Placement ?PASRR number recieved: 03/04/22 ?           ?Patient chooses bed at: [facility] ?Patient to be transferred to facility by: EMS ?Name of family member notified: Garik Diamant ?Patient and family notified of of transfer: 03/07/22 ? ?Discharge Plan and Services ?  ?  ?Post Acute Care Choice: [place] (vs [facility])          ?  ?  ?  ?  ?  ?  ?  ?  ?  ?  ? ?Social Determinants of Health (SDOH) Interventions ?  ? ? ?Readmission Risk Interventions ?   ? View : No data to display.  ?  ?  ?  ? ? ? ? ? ?

## 2022-03-07 NOTE — Progress Notes (Signed)
Occupational Therapy Treatment ?Patient Details ?Name: Joel Dominguez ?MRN: [id number] ?DOB: 08/11/1951 ?Today's Date: 03/07/2022 ? ? ?History of present illness Atlee Kluth is a 75yoM who comes to [facility] on 4/28 c acute on chronic back pain. PMH: AF on eliquis, aortic stenosis, combined CHF, spinal stenosis, Rt foot drop 2/2 spondylosis, HTN. Pt admitted with SIRS c fever, tachycardia, and leukocytosis. Imaging of spine did not show any discitis or osteo but has some paraspinous muscular inflammation. MRI of left foot did show acute osteo to distal phalanx of left second toe. Infectious work up revealed + blood cx for e.faecalis. Pt taken to OR 4/30 c Dr. Posey Pronto podiatry for bilat 2nd digit partial ray resection. Postoperatively pt is WBAT in postop shoes bilat. ?  ?OT comments ? Chart reviewed, pt greeted in bed agreeable to OT tx session. Tx session targeted progressing functional mobility and independent ADL completion. Improvements noted with STS requiring MIN A +1, short amb transfer to bedside chair with CGA with RW. LB dressing completed with MOD A via STS to pull up shorts. Pt is left in bedside chair per request, NAD, all needs met. Recommend STR upon discharge. OT will continue to follow acutely.    ? ?Recommendations for follow up therapy are one component of a multi-disciplinary discharge planning process, led by the attending physician.  Recommendations may be updated based on patient status, additional functional criteria and insurance authorization. ?   ?Follow Up Recommendations ? Skilled nursing-short term rehab (<3 hours/day)  ?  ?Assistance Recommended at Discharge Frequent or constant Supervision/Assistance  ?Patient can return home with the following ? A lot of help with walking and/or transfers;A lot of help with bathing/dressing/bathroom;Assistance with cooking/housework;Help with stairs or ramp for entrance;Assist for transportation ?  ?Equipment Recommendations ? Other (comment) (per next venue of  care)  ?  ?Recommendations for Other Services   ? ?  ?Precautions / Restrictions Precautions ?Precautions: Fall ?Restrictions ?Weight Bearing Restrictions: Yes ?RLE Weight Bearing: Weight bearing as tolerated ?LLE Weight Bearing: Weight bearing as tolerated ?Other Position/Activity Restrictions: with bilateral post op shoes donned  ? ? ?  ? ?Mobility Bed Mobility ?Overal bed mobility: Needs Assistance ?Bed Mobility: Supine to Sit ?  ?  ?Supine to sit: Mod assist, HOB elevated ?  ?  ?  ?  ? ?Transfers ?Overall transfer level: Needs assistance ?Equipment used: Rolling walker (2 wheels) ?Transfers: Sit to/from Stand ?Sit to Stand: Min assist, From elevated surface ?  ?  ?  ?  ?  ?  ?  ?  ?Balance Overall balance assessment: Needs assistance ?Sitting-balance support: Feet supported, Single extremity supported ?Sitting balance-Leahy Scale: Good ?  ?  ?Standing balance support: Bilateral upper extremity supported, Reliant on assistive device for balance, During functional activity ?Standing balance-Leahy Scale: Fair ?  ?  ?  ?  ?  ?  ?  ?  ?  ?  ?  ?  ?   ? ?ADL either performed or assessed with clinical judgement  ? ?ADL Overall ADL's : Needs assistance/impaired ?  ?  ?  ?  ?  ?  ?  ?  ?Upper Body Dressing : Set up;Sitting ?Upper Body Dressing Details (indicate cue type and reason): gown ?Lower Body Dressing: Maximal assistance;Sit to/from stand ?Lower Body Dressing Details (indicate cue type and reason): AFO, post op shoes ?Toilet Transfer: Min guard;Rolling walker (2 wheels) ?Toilet Transfer Details (indicate cue type and reason): short amb transfer to bedside chair with RW ?Toileting- Water quality scientist  and Hygiene: Maximal assistance ?  ?  ?  ?Functional mobility during ADLs: Min guard;Rolling walker (2 wheels) ?  ?  ? ?Extremity/Trunk Assessment   ?  ?  ?  ?  ?  ? ?Vision   ?  ?  ?Perception   ?  ?Praxis   ?  ? ?Cognition Arousal/Alertness: Awake/alert ?Behavior During Therapy: [facility] for tasks  assessed/performed ?Overall Cognitive Status: Within Functional Limits for tasks assessed ?  ?  ?  ?  ?  ?  ?  ?  ?  ?  ?  ?  ?  ?  ?  ?  ?  ?  ?  ?   ?Exercises   ? ?  ?Shoulder Instructions   ? ? ?  ?General Comments    ? ? ?Pertinent Vitals/ Pain       Pain Assessment ?Pain Assessment: 0-10 ?Pain Score: 7  ?Pain Location: back pain ?Pain Descriptors / Indicators: Grimacing, Guarding, Aching ?Pain Intervention(s): Limited activity within patient's tolerance, Monitored during session, Repositioned ? ?Home Living   ?  ?  ?  ?  ?  ?  ?  ?  ?  ?  ?  ?  ?  ?  ?  ?  ?  ?  ? ?  ?Prior Functioning/Environment    ?  ?  ?  ?   ? ?Frequency ? Min 2X/week  ? ? ? ? ?  ?Progress Toward Goals ? ?OT Goals(current goals can now be found in the care plan section) ? Progress towards OT goals: Progressing toward goals ? ?Acute Rehab OT Goals ?Patient Stated Goal: go home ?OT Goal Formulation: With patient ?Time For Goal Achievement: 03/21/22 ?Potential to Achieve Goals: Good  ?Plan Discharge plan remains appropriate   ? ?Co-evaluation ? ? ?   ?  ?  ?  ?  ? ?  ?AM-PAC OT "6 Clicks" Daily Activity     ?Outcome Measure ? ? Help from another person eating meals?: None ?Help from another person taking care of personal grooming?: None ?Help from another person toileting, which includes using toliet, bedpan, or urinal?: A Lot ?Help from another person bathing (including washing, rinsing, drying)?: A Lot ?Help from another person to put on and taking off regular upper body clothing?: A Little ?Help from another person to put on and taking off regular lower body clothing?: A Lot ?6 Click Score: 17 ? ?  ?End of Session Equipment Utilized During Treatment: Rolling walker (2 wheels) ? ?OT Visit Diagnosis: Unsteadiness on feet (R26.81);Repeated falls (R29.6);Muscle weakness (generalized) (M62.81);Pain ?  ?Activity Tolerance Patient tolerated treatment well ?  ?Patient Left in chair;with call bell/phone within reach;with chair alarm set;with  family/visitor present ?  ?Nurse Communication Mobility status ?  ? ?   ? ?Time: 7915-0569 ?OT Time Calculation (min): 29 min ? ?Charges: OT General Charges ?$OT Visit: 1 Visit ?OT Treatments ?$Self Care/Home Management : 8-22 mins ?$Therapeutic Activity: 8-22 mins ? ?Shanon Payor, OTD OTR/L  ?03/07/22, 3:45 PM  ?

## 2022-03-07 NOTE — Assessment & Plan Note (Signed)
>>  ASSESSMENT AND PLAN FOR CHRONIC COMBINED SYSTOLIC AND DIASTOLIC CHF (CONGESTIVE HEART FAILURE) (HCC) WRITTEN ON 03/07/2022  8:39 AM BY AMIN, SUMAYYA, MD  Patient's last known LVEF is 40 to 45% with mild aortic stenosis from a 2D echocardiogram which was done 04/23 Continue  with Bumex ,  carvedilol , and spironolactone . Maintain low-sodium diet

## 2022-03-07 NOTE — Plan of Care (Signed)

## 2022-03-07 NOTE — Assessment & Plan Note (Addendum)
Patient's last known LVEF is 40 to 45% with mild aortic stenosis from a 2D echocardiogram which was done 04/23 ?Continue  with Bumex,  carvedilol, and spironolactone. ?Maintain low-sodium diet ?

## 2022-03-07 NOTE — TOC Progression Note (Signed)
Transition of Care (TOC) - Progression Note  ? ? ?Patient Details  ?Name: Joel Dominguez ?MRN: [id number] ?Date of Birth: November 02, 1951 ? ?Transition of Care (TOC) CM/SW Contact  ?Candie Chroman, LCSW ?Phone Number: ?03/07/2022, 9:36 AM ? ?Clinical Narrative:   Insurance authorization approved: [id number]. Valid 5/4-5/8. SNF admissions coordinator is aware. ? ?Expected Discharge Plan: [place] ?Barriers to Discharge: Continued Medical Work up ? ?Expected Discharge Plan and Services ?Expected Discharge Plan: [place] ?  ?  ?Post Acute Care Choice: [place] (vs [facility]) ?Living arrangements for the past 2 months: [place] ?                ?  ?  ?  ?  ?  ?  ?  ?  ?  ?  ? ? ?Social Determinants of Health (SDOH) Interventions ?  ? ?Readmission Risk Interventions ?   ? View : No data to display.  ?  ?  ?  ? ? ?

## 2022-03-07 NOTE — Progress Notes (Signed)
Report called to Lamanda at [facility] [employer] ?

## 2022-03-08 ENCOUNTER — Telehealth: Payer: Self-pay

## 2022-03-08 LAB — CULTURE, BLOOD (ROUTINE X 2)
Culture: NO GROWTH
Culture: NO GROWTH
Special Requests: ADEQUATE
Special Requests: ADEQUATE

## 2022-03-08 NOTE — Telephone Encounter (Signed)
Patient's wife called office today stating patient has been transferred to SNF in [place]. States she is concerned facility is not following antibiotic orders. Patient received infusion one hour. Would like to update provider on this and see if we could follow up with facility.  ? ?[facility] to follow up on patient's concern. Spoke with Rn who confirmed orders for antibiotics every four hours.  ?Leatrice Jewels, RMA  ?

## 2022-03-11 NOTE — Anesthesia Postprocedure Evaluation (Signed)
Anesthesia Post Note ? ?Patient: Joel Dominguez ? ?Procedure(s) Performed: BILATERAL 2ND DIGIT AMPUTATION (Bilateral: Toe) ? ?Patient location during evaluation: PACU ?Anesthesia Type: MAC ?Level of consciousness: awake and alert ?Pain management: pain level controlled ?Vital Signs Assessment: post-procedure vital signs reviewed and stable ?Respiratory status: spontaneous breathing, nonlabored ventilation, respiratory function stable and patient connected to nasal cannula oxygen ?Cardiovascular status: blood pressure returned to baseline and stable ?Postop Assessment: no apparent nausea or vomiting ?Anesthetic complications: no ? ? ?No notable events documented. ? ? ?Last Vitals:  ?Vitals:  ? 03/07/22 0821 03/07/22 1552  ?BP: (!) 153/81 (!) 148/78  ?Pulse: 79 85  ?Resp: 18 18  ?Temp: 36.6 ?C 36.9 ?C  ?SpO2: 92% 98%  ?  ?Last Pain:  ?Vitals:  ? 03/07/22 1552  ?TempSrc: Oral  ?PainSc:   ? ? ?  ?  ?  ?  ?  ?  ? ?Molli Barrows ? ? ? ? ?

## 2022-03-12 ENCOUNTER — Telehealth: Payer: Self-pay

## 2022-03-12 DIAGNOSIS — Z95828 Presence of other vascular implants and grafts: Secondary | ICD-10-CM | POA: Insufficient documentation

## 2022-03-12 HISTORY — DX: Presence of other vascular implants and grafts: Z95.828

## 2022-03-12 NOTE — Telephone Encounter (Signed)
Patient's spouse left voicemail stating she has questions regarding IV ampicillin. Requested call back.  ?Attempted to call patient's spouse; not able to reach her at this time. Left voicemail requesting call back. ?Leatrice Jewels, RMA  ?

## 2022-03-13 NOTE — Telephone Encounter (Signed)
Spoke with patient and his wife regarding call. Advised that if they would like to try antibiotics at home to speak with case manager who can help arrange home health.  ?Per patient his insurance does not cover home health and is why he was sent to SNF. Will continue Iv anbx and stay at SNF.  ?Leatrice Jewels, RMA  ?

## 2022-03-13 NOTE — Telephone Encounter (Signed)
Patient returned call from yesterday. Would like to know if MD has any alternatives to Iv antibiotics. States that he is not able to discharge from SNF until antibiotics are complete. Would prefer to switch to oral if possible so he can go home.  ?Will forward message to Md. Is scheduled for follow up later this month. ?Leatrice Jewels, RMA  ?

## 2022-03-15 ENCOUNTER — Telehealth: Payer: Self-pay | Admitting: Physician Assistant

## 2022-03-15 NOTE — Telephone Encounter (Signed)
[facility] called to let you know they will be taking care of his PT and OT.  ?

## 2022-03-15 NOTE — Telephone Encounter (Signed)
Noted. AS, CMA 

## 2022-03-19 ENCOUNTER — Ambulatory Visit (INDEPENDENT_AMBULATORY_CARE_PROVIDER_SITE_OTHER): Payer: Medicare Other | Admitting: Podiatry

## 2022-03-19 ENCOUNTER — Ambulatory Visit: Payer: Medicare Other

## 2022-03-19 DIAGNOSIS — Z9889 Other specified postprocedural states: Secondary | ICD-10-CM

## 2022-03-19 NOTE — Progress Notes (Signed)
? ?Subjective:  ?Patient presents today status post bilateral second digit amputation. DOS: 03/03/2022 performed inpatient by Dr. Posey Pronto.  Patient states that he is doing well.  He continues to receive IV antibiotics through his PICC line as per ID.  He is kept the dressings clean dry and intact since last visit.  No new complaints at this time.  He presents today with his wife ? ?Past Medical History:  ?Diagnosis Date  ? A-fib 10/08/2016  ? Aortic stenosis 11/06/2017  ? Atherosclerotic heart disease of native coronary artery with unspecified angina pectoris ([place]) 10/08/2016  ? Cellulitis 11/2019  ? right lower extremity  ? CHF (congestive heart failure) ([place])   ? Chronic anticoagulation 10/08/2016  ? Chronic combined systolic and diastolic heart failure ([place]) 10/08/2016  ? EF range 35-45% in 2016  ? Chronic congestive heart failure ([place])   ? Chronic ulcer of toe ([place]) 08/04/2019  ? Coagulation disorder ([place]) 04/30/2019  ? Foot drop- R 10/08/2016  ? Patient known foot drop for many years due to lumbar spinal stenosis.-Over the past couple of months symptoms have gotten worse.  He feels he is tripping more and cannot lift his foot up as much. Toes are curling and calluses on end of toes.    - Did see spinal surgeon many yrs ago.  Declined sx at that time.     ? Generalized OA 10/08/2016  ? Ortho doc-  Dr. [facility] in [place]- both knees replaced 08, 09.     ? H/O noncompliance with medical treatment, presenting hazards to health 10/22/2017  ? Heart disease   ? Hypertension   ? Hypertensive heart disease with heart failure ([place]) 11/10/2017  ? Longstanding persistent atrial fibrillation ([place])   ? Obesity, Class III, BMI 40-49.9 (morbid obesity) ([place]) 10/08/2016  ? OSA (obstructive sleep apnea) 10/08/2016  ? Bi-PAP nitely- been 20+ yrs now  ? Osteoarthritis 11/19/2017  ? Pulmonary hypertension ([place]) 10/08/2016  ? 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 fascicular block) 11/07/2017  ? Sleep apnea with use of continuous  positive airway pressure (CPAP) 10/08/2016  ? Bi-PAP nitely- been 20+ yrs now  ? Spinal stenosis, lumbar region, with neurogenic claudication 10/08/2016  ? Back specialist in [place]-    only surgeon he would use- pt declined referral here for back pain mgt    ? Stroke [facility])   ? ?  ? ?Objective/Physical Exam ?Neurovascular status intact.  Skin incisions appear to be well coapted with sutures intact.  The toe amputation sites appear to be healing appropriately.  No drainage.  Mild edema and erythema noted to the amputation sites. ? ? ?Assessment: ?1. s/p bilateral second toe amputations. DOS: 03/03/2022 ? ? ?Plan of Care:  ?1. Patient was evaluated.  ?2.  The patient may begin washing and showering and getting the foot wet.  The amputation sites appear well coapted and healing nicely ?3.  Sutures left intact today.  We will plan to remove next week ?4.  Continue IV antibiotics as per infectious disease, Dr. Delaine Lame.  Patient has an appointment 03/26/2022 ?5.  Patient may also transition out of the postsurgical shoes into his diabetic shoes ?6.  Return to clinic 1 week for suture removal ? ?Edrick Kins, DPM ?[place] ? ?Dr. Edrick Kins, DPM  ?  ?[address]                                    ?  [place], [place] [address]                ?Office [phone]  ?Fax [phone] ? ? ? ? ? ? ?

## 2022-03-20 ENCOUNTER — Ambulatory Visit: Payer: Medicare Other | Admitting: Podiatry

## 2022-03-20 ENCOUNTER — Ambulatory Visit (INDEPENDENT_AMBULATORY_CARE_PROVIDER_SITE_OTHER): Payer: Medicare Other | Admitting: Physician Assistant

## 2022-03-20 ENCOUNTER — Encounter: Payer: Self-pay | Admitting: Physician Assistant

## 2022-03-20 VITALS — BP 132/71 | HR 85 | Temp 97.7°F | Ht 72.0 in | Wt 300.0 lb

## 2022-03-20 DIAGNOSIS — Z09 Encounter for follow-up examination after completed treatment for conditions other than malignant neoplasm: Secondary | ICD-10-CM

## 2022-03-20 DIAGNOSIS — A419 Sepsis, unspecified organism: Secondary | ICD-10-CM | POA: Diagnosis not present

## 2022-03-20 DIAGNOSIS — M62830 Muscle spasm of back: Secondary | ICD-10-CM

## 2022-03-20 DIAGNOSIS — I5032 Chronic diastolic (congestive) heart failure: Secondary | ICD-10-CM

## 2022-03-20 DIAGNOSIS — I4811 Longstanding persistent atrial fibrillation: Secondary | ICD-10-CM

## 2022-03-20 DIAGNOSIS — Z9889 Other specified postprocedural states: Secondary | ICD-10-CM

## 2022-03-20 DIAGNOSIS — M869 Osteomyelitis, unspecified: Secondary | ICD-10-CM | POA: Diagnosis not present

## 2022-03-20 DIAGNOSIS — I110 Hypertensive heart disease with heart failure: Secondary | ICD-10-CM

## 2022-03-20 DIAGNOSIS — M545 Low back pain, unspecified: Secondary | ICD-10-CM

## 2022-03-20 MED ORDER — CYCLOBENZAPRINE HCL 10 MG PO TABS: 10.0000 mg | ORAL_TABLET | Freq: Three times a day (TID) | ORAL | 0 refills | Status: DC | PRN

## 2022-03-20 NOTE — Progress Notes (Signed)
?Established patient visit ? ? ?Patient: Joel Dominguez   DOB: November 11, 1950   71 y.o. Male  MRN: [id number] ?Visit Date: 03/20/2022 ? ?Chief Complaint  ?Patient presents with  ? Follow-up  ? ?Subjective  ?  ?HPI  ?Patient presents for hospital follow-up. Patient was discharged from [facility] rehab facility and states things are going better at home. Patient reports had labs drawn yesterday by the nurse with [facility] and gets his antibiotic infusion from [place] which is delivered. Reports muscle relaxer helps with back spasms, denies drowsiness.  ? ? ? ?Discharge summary: ?Admit date:     03/01/2022  ?Discharge date: 03/07/22  ?Discharge Physician: Lorella Nimrod  ?  ?PCP: Lorrene Reid, PA-C  ?  ?Recommendations at discharge:  ?Please obtain weekly CBC with differential, CMP and ESR. ?Continue IV ampicillin every 4 hourly until 04/13/2022. ?Please remove PICC line after completion of antibiotic. ?Follow-up with infectious disease. ?Follow-up with podiatry-keep dressing intact and clean until that time, can be changed if gets soiled. ?Follow-up with primary care provider. ?  ?Discharge Diagnoses: ?Principal Problem: ?  Sepsis ([place]) ?Active Problems: ?  Systemic inflammatory response syndrome (SIRS) (HCC) ?  Acute bilateral low back pain without sciatica ?  Spinal stenosis, lumbar region, with neurogenic claudication ?  Chronic combined systolic and diastolic CHF (congestive heart failure) ([place]) ?  Longstanding persistent atrial fibrillation ([place]) ?  Obesity, Class III, BMI 40-49.9 (morbid obesity) ([place]) ?  Toe osteomyelitis, left ([place]) ?  ?Hospital Course: ?I haveTaken from prior notes. ? ?Joel Dominguez is a 71 y.o. male with medical history significant for chronic A-fib on anticoagulation with Eliquis, mild aortic stenosis, chronic combined systolic and diastolic dysfunction CHF with last known LVEF of 40 to 45% from a 2D echocardiogram which was done 04/23, spinal stenosis, right foot drop  due to lumbar spinal stenosis, hypertension, morbid obesity (BMI 42) who presents to the ER for evaluation of 2 days of worsening low back pain. ?Patient has a history of chronic low back pain which got worse over the last 2 days. ?Per patient's wife who provides most of the history he was working in the yard 2 days prior to admission and had to bend over to pull some weeds.  He subsequently developed pain in his lower back and had tried over-the-counter pain medication as well as prescription meds without any relief.  He tried to get around the house with a rolling walker but over the last 12 hours has been unable to ambulate due to worsening pain that he rates 10 x 10 in intensity at its worst.  Any movement of his legs causes worsening of his back pain.  He denies having any urinary/fecal retention or incontinence and denies having any saddle anesthesia. ?He was recently discharged from the hospital and evaluated for sepsis from right leg cellulitis. ?He denies having any cough, no shortness of breath, no headache, no dizziness, no lightheadedness, no nausea, no vomiting, no changes in his bowel habits, no focal deficits or blurred vision. ?He had a fever upon arrival to the ER with a Tmax of 100.4 and is tachycardic. ?  ?Found to have Enterococcus bacteremia and bilateral second toe osteomyelitis s/p amputation on 03/03/2022.  TEE was negative for any vegetations. ?Infectious disease was consulted and they are planning to continue IV ampicillin 2 g every 4 hours for 6 weeks, can be done as a continuous infusion of 12 g in 24 hours.  End date will be 86/10/23. ?  Patient will need weekly CBC with differential, CMP and ESR. ?Please pull the PICC line on completion of IV antibiotics. ?Patient will follow-up with Dr. Delaine Lame on 03/26/2022 at 9:15 AM. ?  ?It is interesting to note that in May 2021 patient had endophthalmitis left eye in which was culture was positive for Enterococcus faecalis.  Apparently he was  getting injections in his left eye prior to that.   ?  ?PT/OT recommended SNF, patient is being discharged to rehab for further management. ?  ?He will continue the rest of his home medications and follow-up with his providers. ?  ?Assessment and Plan: ?Systemic inflammatory response syndrome (SIRS) (HCC) ?Patient meets sepsis criteria with fever, tachycardia and leukocytosis. ?Lactic acid is within normal limits and source of infection is unclear at this time. ?Concern for possible osteomyelitis involving the left foot versus an epidural abscess resulting in worsening low back pain. ?Patient already received empiric antibiotic therapy with vancomycin, cefepime and Flagyl. ?Blood cultures positive for Enterococcus bacteremia, TEE negative for endocarditis.  Repeat blood cultures done on 03/03/2022 negative so far.Joel Dominguez ?Also found to have bilateral second toe osteomyelitis s/p amputation. ?-Continue Unasyn for 6 weeks ?-PICC line placed today ?  ?  ?Acute bilateral low back pain without sciatica ?Patient has a history of chronic low back pain from lumbar spinal stenosis ?He presented in acute exacerbation of his chronic low back pain and is unable to move his extremities due to same.   ?He also has a fever and a white count concerning for possible infectious etiology as the cause of his pain. ?Follow-up results of MRI of lumbar spine ?Patient already received 1 dose of IV vancomycin, cefepime and Flagyl ?Pain control and muscle relaxant as needed ?  ?Spinal stenosis, lumbar region, with neurogenic claudication ?Treatment as outlined in 2 ?  ?Chronic combined systolic and diastolic CHF (congestive heart failure) ([place]) ?Patient's last known LVEF is 40 to 45% with mild aortic stenosis from a 2D echocardiogram which was done 04/23 ?Continue  with Bumex,  carvedilol, and spironolactone. ?Maintain low-sodium diet ?  ?Longstanding persistent atrial fibrillation ([place]) ?Continue carvedilol for rate control ?Continue Eliquis as  primary prophylaxis for an acute stroke ?  ?Obesity, Class III, BMI 40-49.9 (morbid obesity) ([place]) ?Complicates overall prognosis and care ?Lifestyle modification and exercise discussed with patient ?  ?  ?  ?Pain control - [place] Controlled Substance Reporting System database was reviewed. and patient was instructed, not to drive, operate heavy machinery, perform activities at heights, swimming or participation in water activities or provide baby-sitting services while on Pain, Sleep and Anxiety Medications; until their outpatient Physician has advised to do so again. Also recommended to not to take more than prescribed Pain, Sleep and Anxiety Medications.  ?Consultants: Infectious disease, cardiology, podiatry ?Procedures performed: Bilateral second toe amputation, TEE ?Disposition: Skilled nursing facility ?Diet recommendation:  ?Cardiac diet ?DISCHARGE MEDICATION: ?Allergies as of 03/07/2022   ?  ? ? ? ?Medications: ?Outpatient Medications Prior to Visit  ?Medication Sig  ? ampicillin IVPB Inject 2 g into the vein every 4 (four) hours. Indication: E. Faecalis bacteremia ?Last Day of Therapy:  04/13/2022 ?Labs - Once weekly:  CBC/D, ESR and CMP ?Please pull PIC at completion of IV antibiotics ?Fax weekly labs to [phone] ?Alternative dosing is ampicillin 12gm as a continuous infusion over 24h.  ? apixaban (ELIQUIS) 5 MG TABS tablet Take 1 tablet (5 mg total) by mouth 2 (two) times daily.  ? B Complex-C (B-COMPLEX WITH VITAMIN  C) tablet Take 1 tablet by mouth daily.  ? bumetanide (BUMEX) 1 MG tablet TAKE 1 TABLET BY MOUTH EVERY DAY AND TAKE AN ADDITIONAL 1 TABLET ON MONDAY, WEDNESDAY AND FRIDAY  ? carvedilol (COREG) 12.5 MG tablet TAKE 1 TABLET (12.5 MG TOTAL) BY MOUTH 2 (TWO) TIMES DAILY WITH A MEAL.  ? hydrocerin (EUCERIN) CREA Apply 1 application topically 2 (two) times daily.  ? HYDROcodone-acetaminophen (NORCO) 10-325 MG tablet Take 1 tablet by mouth every 8 (eight) hours as needed for moderate  pain.  ? Multiple Vitamin (MULTIVITAMIN) tablet Take 1 tablet by mouth daily.  ? polyethylene glycol (MIRALAX / GLYCOLAX) 17 g packet Take 17 g by mouth daily.  ? senna (SENOKOT) 8.6 MG TABS tablet Take 1 table

## 2022-03-26 ENCOUNTER — Ambulatory Visit (INDEPENDENT_AMBULATORY_CARE_PROVIDER_SITE_OTHER): Payer: Medicare Other | Admitting: Podiatry

## 2022-03-26 ENCOUNTER — Ambulatory Visit: Payer: Medicare Other | Attending: Infectious Diseases | Admitting: Infectious Diseases

## 2022-03-26 ENCOUNTER — Encounter: Payer: Self-pay | Admitting: Infectious Diseases

## 2022-03-26 ENCOUNTER — Encounter: Payer: Self-pay | Admitting: Podiatry

## 2022-03-26 VITALS — BP 158/90 | HR 90 | Temp 97.1°F | Ht 72.0 in

## 2022-03-26 DIAGNOSIS — L03115 Cellulitis of right lower limb: Secondary | ICD-10-CM | POA: Insufficient documentation

## 2022-03-26 DIAGNOSIS — I890 Lymphedema, not elsewhere classified: Secondary | ICD-10-CM | POA: Insufficient documentation

## 2022-03-26 DIAGNOSIS — Z9889 Other specified postprocedural states: Secondary | ICD-10-CM

## 2022-03-26 DIAGNOSIS — M21371 Foot drop, right foot: Secondary | ICD-10-CM | POA: Insufficient documentation

## 2022-03-26 DIAGNOSIS — I110 Hypertensive heart disease with heart failure: Secondary | ICD-10-CM | POA: Diagnosis not present

## 2022-03-26 DIAGNOSIS — Z6841 Body Mass Index (BMI) 40.0 and over, adult: Secondary | ICD-10-CM | POA: Diagnosis not present

## 2022-03-26 DIAGNOSIS — I482 Chronic atrial fibrillation, unspecified: Secondary | ICD-10-CM | POA: Diagnosis not present

## 2022-03-26 DIAGNOSIS — B952 Enterococcus as the cause of diseases classified elsewhere: Secondary | ICD-10-CM | POA: Diagnosis not present

## 2022-03-26 DIAGNOSIS — I509 Heart failure, unspecified: Secondary | ICD-10-CM | POA: Diagnosis not present

## 2022-03-26 DIAGNOSIS — Z7901 Long term (current) use of anticoagulants: Secondary | ICD-10-CM | POA: Insufficient documentation

## 2022-03-26 DIAGNOSIS — R609 Edema, unspecified: Secondary | ICD-10-CM | POA: Diagnosis not present

## 2022-03-26 DIAGNOSIS — R7881 Bacteremia: Secondary | ICD-10-CM | POA: Diagnosis present

## 2022-03-26 DIAGNOSIS — M48061 Spinal stenosis, lumbar region without neurogenic claudication: Secondary | ICD-10-CM | POA: Diagnosis not present

## 2022-03-26 DIAGNOSIS — Z96653 Presence of artificial knee joint, bilateral: Secondary | ICD-10-CM | POA: Insufficient documentation

## 2022-03-26 NOTE — Patient Instructions (Signed)
You are here for follow up for the infection with bacteremia due to enterococcus. You are on Iv ampicillin until 04/13/22- then your PICC can be removed The amputation site of both 2nd toes look great. Your last labs from 03/21/22- looks good Will follow you as needed

## 2022-03-26 NOTE — Progress Notes (Signed)
NAME: Joel Dominguez  DOB: Apr 25, 1951  MRN: [id number]  Date/Time: 03/26/2022 9:32 AM  Subjective:   ? Joel Dominguez is a 71 y.o. male with a history of chronic atrial fibrillation on anticoagulation, CHF, severe lumbar stenosis with right foot drop, hypertension, morbid obesity, bilateral knee replacement presented to [facility] on 02/21/2022 with low back pain. He also had ulcers on both second toes.  Blood cultures grew Enterococcus MRI of the left foot showed acute osteomyelitis involving the distal phalanx of the left second toe and the right also showed osteomyelitis of second toe.  He was taken by podiatrist for partial amputation of the toes. He underwent a TEE and it was negative for endocarditis.  He also had MRI of the lumbar spine that did not show any paraspinal fluid or abscess or discitis.  Severe spinal canal stenosis at L2-L3 was seen. Patient was discharged to rehab on 6 weeks of IV ampicillin.  But he left rehab because his stay was not pleasant the he is now doing IV at home.  He is doing well.  The pain in his feet much improved The back pain is at his baseline and it is not worse anymore he wants to get back to the yard to do some work.  I told him that since he has a continuous Infusion May Have To Hold off till June 10 When He Completes His Antibiotics.  Past Medical History:  Diagnosis Date   A-fib 10/08/2016   Aortic stenosis 11/06/2017   Atherosclerotic heart disease of native coronary artery with unspecified angina pectoris ([place]) 10/08/2016   Cellulitis 11/2019   right lower extremity   CHF (congestive heart failure) ([place])    Chronic anticoagulation 10/08/2016   Chronic combined systolic and diastolic heart failure ([place]) 10/08/2016   EF range 35-45% in 2016   Chronic congestive heart failure ([place])    Chronic ulcer of toe ([place]) 08/04/2019   Coagulation disorder ([place]) 04/30/2019   Foot drop- R 10/08/2016   Patient known foot drop for many years due to lumbar spinal  stenosis.-Over the past couple of months symptoms have gotten worse.  He feels he is tripping more and cannot lift his foot up as much. Toes are curling and calluses on end of toes.    - Did see spinal surgeon many yrs ago.  Declined sx at that time.      Generalized OA 10/08/2016   Ortho doc-  [facility]. [facility] in [place]- both knees replaced 08, 09.      H/O noncompliance with medical treatment, presenting hazards to health 10/22/2017   Heart disease    Hypertension    Hypertensive heart disease with heart failure ([place]) 11/10/2017   Longstanding persistent atrial fibrillation (HCC)    Obesity, Class III, BMI 40-49.9 (morbid obesity) ([place]) 10/08/2016   OSA (obstructive sleep apnea) 10/08/2016   Bi-PAP nitely- been 20+ yrs now   Osteoarthritis 11/19/2017   Pulmonary hypertension ([place]) 10/08/2016   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 fascicular block) 11/07/2017   Sleep apnea with use of continuous positive airway pressure (CPAP) 10/08/2016   Bi-PAP nitely- been 20+ yrs now   Spinal stenosis, lumbar region, with neurogenic claudication 10/08/2016   Back specialist in [place]-    only surgeon he would use- pt declined referral here for back pain mgt     Stroke [facility])     Past Surgical History:  Procedure Laterality Date   AMPUTATION TOE Bilateral 03/03/2022   Procedure: BILATERAL 2ND DIGIT  AMPUTATION;  Surgeon: [name], DPM;  Location: [facility];  Service: Podiatry;  Laterality: Bilateral;   HAMMER TOE SURGERY Right 09/28/2021   Middle toe   I & D EXTREMITY Right 10/30/2019   Procedure: IRRIGATION AND DEBRIDEMENT EXTREMITY, right leg possible wound vac placement;  Surgeon: Marchia Bond, MD;  Location: [place];  Service: Orthopedics;  Laterality: Right;   REPLACEMENT TOTAL KNEE Bilateral    TEE WITHOUT CARDIOVERSION N/A 03/05/2022   Procedure: TRANSESOPHAGEAL ECHOCARDIOGRAM (TEE);  Surgeon: Minna Merritts, MD;  Location: [facility];  Service: Cardiovascular;  Laterality: N/A;   TIBIA  FRACTURE SURGERY     VITRECTOMY PARS PLANA 23 GAUGE FOR ENDOPHTHALMITIS Left 03/04/2020   Procedure: VITRECTOMY PARS PLANA 23 GAUGE FOR ENDOPHTHALMITIS;  Surgeon: Jalene Mullet, MD;  Location: [place];  Service: Ophthalmology;  Laterality: Left;    Social History   Socioeconomic History   Marital status: Married    Spouse name: Not on file   Number of children: Not on file   Years of education: Not on file   Highest education level: Not on file  Occupational History   Not on file  Tobacco Use   Smoking status: Never    Passive exposure: Never   Smokeless tobacco: Never  Vaping Use   Vaping Use: Never used  Substance and Sexual Activity   Alcohol use: Not Currently   Drug use: No   Sexual activity: Not on file  Other Topics Concern   Not on file  Social History Narrative   Not on file   Social Determinants of Health   Financial Resource Strain: Not on file  Food Insecurity: Not on file  Transportation Needs: Not on file  Physical Activity: Not on file  Stress: Not on file  Social Connections: Not on file  Intimate Partner Violence: Not on file    Family History  Problem Relation Age of Onset   Congestive Heart Failure Mother    Hypertension Mother    Diabetes Mother    Cancer Father        lung   Congestive Heart Failure Sister    Hypertension Sister    Hypertension Brother    Hypertension Sister    Diabetes Brother    Allergies  Allergen Reactions   Cephalexin Rash   Dilaudid [Hydromorphone] Other (See Comments)    Hallucinations   I? Current Outpatient Medications  Medication Sig Dispense Refill   ampicillin IVPB Inject 2 g into the vein every 4 (four) hours. Indication: E. Faecalis bacteremia Last Day of Therapy:  04/13/2022 Labs - Once weekly:  CBC/D, ESR and CMP Please pull PIC at completion of IV antibiotics Fax weekly labs to [phone] Alternative dosing is ampicillin 12gm as a continuous infusion over 24h. 228 Units 0   apixaban (ELIQUIS)  5 MG TABS tablet Take 1 tablet (5 mg total) by mouth 2 (two) times daily. 180 tablet 1   B Complex-C (B-COMPLEX WITH VITAMIN C) tablet Take 1 tablet by mouth daily. 30 tablet 0   bumetanide (BUMEX) 1 MG tablet TAKE 1 TABLET BY MOUTH EVERY DAY AND TAKE AN ADDITIONAL 1 TABLET ON MONDAY, WEDNESDAY AND FRIDAY 102 tablet 1   carvedilol (COREG) 12.5 MG tablet TAKE 1 TABLET (12.5 MG TOTAL) BY MOUTH 2 (TWO) TIMES DAILY WITH A MEAL. 180 tablet 1   cyclobenzaprine (FLEXERIL) 10 MG tablet Take 1 tablet (10 mg total) by mouth 3 (three) times daily as needed for muscle spasms. 30 tablet 0  hydrocerin (EUCERIN) CREA Apply 1 application topically 2 (two) times daily. 454 g 0   HYDROcodone-acetaminophen (NORCO) 10-325 MG tablet Take 1 tablet by mouth every 8 (eight) hours as needed for moderate pain. 30 tablet 0   Multiple Vitamin (MULTIVITAMIN) tablet Take 1 tablet by mouth daily.     polyethylene glycol (MIRALAX / GLYCOLAX) 17 g packet Take 17 g by mouth daily. 14 each 0   senna (SENOKOT) 8.6 MG TABS tablet Take 1 tablet (8.6 mg total) by mouth daily. 120 tablet 0   spironolactone (ALDACTONE) 25 MG tablet TAKE 1 TABLET (25 MG TOTAL) BY MOUTH DAILY. 90 tablet 1   Vitamin D, Ergocalciferol, (DRISDOL) 1.25 MG (50000 UNIT) CAPS capsule TAKE 1 CAPSULE BY MOUTH EVERY 7 DAYS. 12 capsule 1   No current facility-administered medications for this visit.     Abtx:  Anti-infectives (From admission, onward)    None       REVIEW OF SYSTEMS:  Const: negative fever, negative chills, negative weight loss Eyes: negative diplopia or visual changes, negative eye pain ENT: negative coryza, negative sore throat Resp: negative cough, hemoptysis, dyspnea Cards: negative for chest pain, palpitations, lower extremity edema GU: negative for frequency, dysuria and hematuria GI: Negative for abdominal pain, diarrhea, bleeding, constipation Skin: negative for rash and pruritus Heme: negative for easy bruising and gum/nose  bleeding MS: Baseline low back pain Neurolo:Right foot drop negative for headaches, dizziness, vertigo, memory problems  Psych: negative for feelings of anxiety, depression  Endocrine: negative for thyroid, diabetes Allergy/Immunology-as above o Objective:  VITALS:  BP (!) 158/90   Pulse 90   Temp (!) 97.1 F (36.2 C) (Temporal)   Ht 6' (1.829 m)   BMI 40.69 kg/m   PHYSICAL EXAM:  General: Alert, cooperative, no distress, appears stated age.  Obese Head: Normocephalic, without obvious abnormality, atraumatic. Eyes: Conjunctivae clear, anicteric sclerae. Pupils are equal ENT Nares normal. No drainage or sinus tenderness. Lips, mucosa, and tongue normal. No Thrush Neck: Supple, symmetrical, no adenopathy, thyroid: non tender no carotid bruit and no JVD. Back: No CVA tenderness. Lungs: Bilateral air entry.  Clear  heart: Irregular well controlled. Abdomen: Did not examine as he is in wheelchair Extremities: Bilateral second toe partial amputation.  Site is healed very well.  Sutures still in place.     Edema legs   skin: No rashes or lesions. Or bruising Lymph: Cervical, supraclavicular normal. Neurologic: Grossly non-focal Right PICC line Pertinent Labs Lab Results 03/19/2022 labs Creatinine 0.97 Glucose 148 WBC 8.8 Hb 11.9 Platelet 460 Sed rate 94  ? Impression/Recommendation Enterococcus faecalis bacteremia.  Possible source is the toe infection.  But no cultures were sent.  TEE there was no evidence of endocarditis.  Repeat blood cultures were negative.  It is interesting to note that in May 2021 he had endophthalmitis left eye and culture was positive for Enterococcus faecalis.  At that time he was getting injections left eye for macular degeneration. So we decided we will treat him with 6 weeks of IV antibiotics.  His end date is 04/13/2022.  He is getting continuous ampicillin infusion.  After completion of antibiotic PICC line can be removed.  Bilateral second  toe infection status post partial amputation.  Wound healing very well.  History of recurrent cellulitis of the right leg because of lymphedema  Bilateral TKA  Bilateral venous edema  A-fib on Eliquis  CHF on carvedilol  Morbid obesity  Follow-up as needed Discussed the management with the patient and his  wife in great detail. ? ___________________________________________________  Note:  This document was prepared using Dragon voice recognition software and may include unintentional dictation errors.

## 2022-04-02 ENCOUNTER — Other Ambulatory Visit: Payer: Self-pay | Admitting: Cardiology

## 2022-04-02 DIAGNOSIS — I482 Chronic atrial fibrillation, unspecified: Secondary | ICD-10-CM

## 2022-04-02 NOTE — Telephone Encounter (Signed)
Prescription refill request for Eliquis received. Indication: Afib  Last office visit: 02/28/22 [facility])  Scr: 0.99 (03/07/22)  Age: 71 Weight: 136.2kg  Appropriate dose and refill sent to requested pharmacy.

## 2022-04-08 NOTE — Progress Notes (Signed)
Subjective:  Patient presents today status post bilateral second digit amputation. DOS: 03/03/2022 performed inpatient by Dr. Posey Pronto.  Patient states that he is doing well.  He continues to receive IV antibiotics through his PICC line as per ID.  Scheduled to complete the IV antibiotics on 04/13/2022.  Overall improvement.  No new complaints at this time  Past Medical History:  Diagnosis Date   A-fib 10/08/2016   Aortic stenosis 11/06/2017   Atherosclerotic heart disease of native coronary artery with unspecified angina pectoris ([place]) 10/08/2016   Cellulitis 11/2019   right lower extremity   CHF (congestive heart failure) ([place])    Chronic anticoagulation 10/08/2016   Chronic combined systolic and diastolic heart failure ([place]) 10/08/2016   EF range 35-45% in 2016   Chronic congestive heart failure ([place])    Chronic ulcer of toe ([place]) 08/04/2019   Coagulation disorder ([place]) 04/30/2019   Foot drop- R 10/08/2016   Patient known foot drop for many years due to lumbar spinal stenosis.-Over the past couple of months symptoms have gotten worse.  He feels he is tripping more and cannot lift his foot up as much. Toes are curling and calluses on end of toes.    - Did see spinal surgeon many yrs ago.  Declined sx at that time.      Generalized OA 10/08/2016   Ortho doc-  [facility]. [facility] in [place]- both knees replaced 08, 09.      H/O noncompliance with medical treatment, presenting hazards to health 10/22/2017   Heart disease    Hypertension    Hypertensive heart disease with heart failure ([place]) 11/10/2017   Longstanding persistent atrial fibrillation (HCC)    Obesity, Class III, BMI 40-49.9 (morbid obesity) ([place]) 10/08/2016   OSA (obstructive sleep apnea) 10/08/2016   Bi-PAP nitely- been 20+ yrs now   Osteoarthritis 11/19/2017   Pulmonary hypertension ([place]) 10/08/2016   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 fascicular block) 11/07/2017   Sleep apnea with use of continuous positive airway pressure  (CPAP) 10/08/2016   Bi-PAP nitely- been 20+ yrs now   Spinal stenosis, lumbar region, with neurogenic claudication 10/08/2016   Back specialist in [place]-    only surgeon he would use- pt declined referral here for back pain mgt     Stroke [facility])      Past Surgical History:  Procedure Laterality Date   AMPUTATION TOE Bilateral 03/03/2022   Procedure: BILATERAL 2ND DIGIT AMPUTATION;  Surgeon: [name], DPM;  Location: [facility];  Service: Podiatry;  Laterality: Bilateral;   HAMMER TOE SURGERY Right 09/28/2021   Middle toe   I & D EXTREMITY Right 10/30/2019   Procedure: IRRIGATION AND DEBRIDEMENT EXTREMITY, right leg possible wound vac placement;  Surgeon: Marchia Bond, MD;  Location: [place];  Service: Orthopedics;  Laterality: Right;   REPLACEMENT TOTAL KNEE Bilateral    TEE WITHOUT CARDIOVERSION N/A 03/05/2022   Procedure: TRANSESOPHAGEAL ECHOCARDIOGRAM (TEE);  Surgeon: Minna Merritts, MD;  Location: [facility];  Service: Cardiovascular;  Laterality: N/A;   TIBIA FRACTURE SURGERY     VITRECTOMY PARS PLANA 23 GAUGE FOR ENDOPHTHALMITIS Left 03/04/2020   Procedure: VITRECTOMY PARS PLANA 23 GAUGE FOR ENDOPHTHALMITIS;  Surgeon: Jalene Mullet, MD;  Location: [place];  Service: Ophthalmology;  Laterality: Left;   Allergies  Allergen Reactions   Cephalexin Rash   Dilaudid [Hydromorphone] Other (See Comments)    Hallucinations     Objective/Physical Exam Neurovascular status intact.  Amputation sites appear to be  healing appropriately.  There is improvement of the edema and erythema around the amputation site.  Assessment: 1. s/p bilateral second toe amputations. DOS: 03/03/2022   Plan of Care:  1. Patient was evaluated.  2.  Sutures removed today  3.  Continue IV antibiotics as per infectious disease, Dr. Delaine Lame.  Patient finishes the IV antibiotics on 04/13/2022 4.  Recommend Silvadene cream daily to the incision sites 5.  Continue the postsurgical shoes with AFO brace right lower  extremity 6.  Return to clinic 3 weeks  Edrick Kins, DPM [facility]  Dr. Edrick Kins, DPM    [address], [place] [address]                Office [phone]  Fax [phone]

## 2022-04-13 NOTE — Progress Notes (Unsigned)
Cardiology Office Note:    Date:  04/15/2022   ID:  Joel Dominguez, DOB [birth year], MRN [id number]  PCP:  Lorrene Reid, PA-C  Cardiologist:  Shirlee More, MD    Referring MD: Lorrene Reid, PA-C    ASSESSMENT:    1. Hypertensive heart disease with heart failure ([place])   2. Chronic atrial fibrillation (HCC)   3. Chronic anticoagulation   4. Bacteremia    PLAN:    In order of problems listed above:  Stable BP is at target denies heart failure is well compensated most recent echo was normal ejection fraction he will continue his MRA beta-blocker at this time I do not think we need to institute other guideline directed therapy with a normal ejection fraction. Rate controlled continue beta-blocker and anticoagulant We will draw CBC sedimentation rate and CRP with copy to infectious diseases   Next appointment: 6 months   Medication Adjustments/Labs and Tests Ordered: Current medicines are reviewed at length with the patient today.  Concerns regarding medicines are outlined above.  Orders Placed This Encounter  Procedures   Comp Met (CMET)   CBC   C-reactive protein   Sedimentation rate   No orders of the defined types were placed in this encounter.   Chief Complaint  Patient presents with   Follow-up  With recent hospitalization with bacteremia and for heart failure History of Present Illness:    Joel Dominguez is a 71 y.o. male with a hx of longstanding permanent atrial fibrillation mild aortic stenosis hypertensive heart disease with diastolic heart failure chronic anticoagulant therapy bifascicular heart block last seen 02/28/2022 following admission to the hospital [facility] with cellulitis.He was admitted at [facility] echocardiogram performed 02/26/2022 shows EF 40 to 45% aortic valve is described as thickened restricted but there is no gradient.    He was admitted with cellulitis.  On IV antibiotics EF looked to be mildly reduced and  was advised to follow-up with cardiology.  Previous echocardiogram showed EF of 45 to 50% with mild aortic stenosis mean gradient of 10 mmHg.  The study was performed because of Enterococcus bacteremia due to osteomyelitis and foot ulcer.  He had recent transesophageal echocardiogram 03/05/2022 showing no findings of Endocarditis aortic valve is mildly calcified left ventricular ejection fraction 55 to 60%.  Compliance with diet, lifestyle and medications: Yes  He is finally starting to feel better he is finished with IV antibiotic therapy he is not having fever chills shortness of breath edema chest pain palpitation or syncope. They request me to recheck labs including CBC sedimentation rate and CRP copy to infectious diseases Past Medical History:  Diagnosis Date   A-fib 10/08/2016   Aortic stenosis 11/06/2017   Atherosclerotic heart disease of native coronary artery with unspecified angina pectoris ([place]) 10/08/2016   Cellulitis 11/2019   right lower extremity   CHF (congestive heart failure) ([place])    Chronic anticoagulation 10/08/2016   Chronic combined systolic and diastolic heart failure ([place]) 10/08/2016   EF range 35-45% in 2016   Chronic congestive heart failure ([place])    Chronic ulcer of toe ([place]) 08/04/2019   Coagulation disorder ([place]) 04/30/2019   Foot drop- R 10/08/2016   Patient known foot drop for many years due to lumbar spinal stenosis.-Over the past couple of months symptoms have gotten worse.  He feels he is tripping more and cannot lift his foot up as much. Toes are curling and calluses on end of toes.    - Did see  spinal surgeon many yrs ago.  Declined sx at that time.      Generalized OA 10/08/2016   Ortho doc-  [facility]. [facility] in [place]- both knees replaced 08, 09.      H/O noncompliance with medical treatment, presenting hazards to health 10/22/2017   Heart disease    Hypertension    Hypertensive heart disease with heart failure ([place]) 11/10/2017   Longstanding persistent atrial  fibrillation (HCC)    Obesity, Class III, BMI 40-49.9 (morbid obesity) ([place]) 10/08/2016   OSA (obstructive sleep apnea) 10/08/2016   Bi-PAP nitely- been 20+ yrs now   Osteoarthritis 11/19/2017   Pulmonary hypertension ([place]) 10/08/2016   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 fascicular block) 11/07/2017   Sleep apnea with use of continuous positive airway pressure (CPAP) 10/08/2016   Bi-PAP nitely- been 20+ yrs now   Spinal stenosis, lumbar region, with neurogenic claudication 10/08/2016   Back specialist in [place]-    only surgeon he would use- pt declined referral here for back pain mgt     Stroke [facility])     Past Surgical History:  Procedure Laterality Date   AMPUTATION TOE Bilateral 03/03/2022   Procedure: BILATERAL 2ND DIGIT AMPUTATION;  Surgeon: [name], DPM;  Location: [facility];  Service: Podiatry;  Laterality: Bilateral;   HAMMER TOE SURGERY Right 09/28/2021   Middle toe   I & D EXTREMITY Right 10/30/2019   Procedure: IRRIGATION AND DEBRIDEMENT EXTREMITY, right leg possible wound vac placement;  Surgeon: Marchia Bond, MD;  Location: [place];  Service: Orthopedics;  Laterality: Right;   REPLACEMENT TOTAL KNEE Bilateral    TEE WITHOUT CARDIOVERSION N/A 03/05/2022   Procedure: TRANSESOPHAGEAL ECHOCARDIOGRAM (TEE);  Surgeon: Minna Merritts, MD;  Location: [facility];  Service: Cardiovascular;  Laterality: N/A;   TIBIA FRACTURE SURGERY     VITRECTOMY PARS PLANA 23 GAUGE FOR ENDOPHTHALMITIS Left 03/04/2020   Procedure: VITRECTOMY PARS PLANA 23 GAUGE FOR ENDOPHTHALMITIS;  Surgeon: Jalene Mullet, MD;  Location: [place];  Service: Ophthalmology;  Laterality: Left;    Current Medications: Current Meds  Medication Sig   B Complex-C (B-COMPLEX WITH VITAMIN C) tablet Take 1 tablet by mouth daily.   bumetanide (BUMEX) 1 MG tablet TAKE 1 TABLET BY MOUTH EVERY DAY AND TAKE AN ADDITIONAL 1 TABLET ON MONDAY, WEDNESDAY AND FRIDAY   carvedilol (COREG) 12.5 MG tablet TAKE 1 TABLET (12.5 MG  TOTAL) BY MOUTH 2 (TWO) TIMES DAILY WITH A MEAL.   ELIQUIS 5 MG TABS tablet TAKE 1 TABLET BY MOUTH TWICE  DAILY   hydrocerin (EUCERIN) CREA Apply 1 application topically 2 (two) times daily.   HYDROcodone-acetaminophen (NORCO) 10-325 MG tablet Take 1 tablet by mouth every 8 (eight) hours as needed for moderate pain.   Multiple Vitamin (MULTIVITAMIN) tablet Take 1 tablet by mouth daily.   spironolactone (ALDACTONE) 25 MG tablet TAKE 1 TABLET (25 MG TOTAL) BY MOUTH DAILY.   Vitamin D, Ergocalciferol, (DRISDOL) 1.25 MG (50000 UNIT) CAPS capsule TAKE 1 CAPSULE BY MOUTH EVERY 7 DAYS.     Allergies:   Cephalexin and Dilaudid [hydromorphone]   Social History   Socioeconomic History   Marital status: Married    Spouse name: Not on file   Number of children: Not on file   Years of education: Not on file   Highest education level: Not on file  Occupational History   Not on file  Tobacco Use   Smoking status: Never    Passive exposure: Never  Smokeless tobacco: Never  Vaping Use   Vaping Use: Never used  Substance and Sexual Activity   Alcohol use: Not Currently   Drug use: No   Sexual activity: Not on file  Other Topics Concern   Not on file  Social History Narrative   Not on file   Social Determinants of Health   Financial Resource Strain: Not on file  Food Insecurity: Not on file  Transportation Needs: Not on file  Physical Activity: Not on file  Stress: Not on file  Social Connections: Not on file     Family History: The patient's family history includes Cancer in his father; Congestive Heart Failure in his mother and sister; Diabetes in his brother and mother; Hypertension in his brother, mother, sister, and sister. ROS:   Please see the history of present illness.    All other systems reviewed and are negative.  EKGs/Labs/Other Studies Reviewed:    The following studies were reviewed today:    Recent Labs: 10/10/2021: NT-Pro BNP 508 02/18/2022: BNP  155.2 03/01/2022: ALT 14 03/03/2022: Hemoglobin 12.8; Platelets 204 03/05/2022: Magnesium 2.1 03/07/2022: BUN 18; Creatinine, Ser 0.99; Potassium 3.8; Sodium 136  Recent Lipid Panel    Component Value Date/Time   CHOL 184 09/05/2021 0901   TRIG 211 (H) 09/05/2021 0901   HDL 41 09/05/2021 0901   CHOLHDL 4.5 09/05/2021 0901   CHOLHDL 4.3 10/08/2016 1144   VLDL 45 (H) 10/08/2016 1144   LDLCALC 106 (H) 09/05/2021 0901    Physical Exam:    VS:  BP (!) 158/82 (BP Location: Left Arm, Patient Position: Sitting, Cuff Size: Normal)   Pulse 84   Ht 6' (1.829 m)   Wt 298 lb (135.2 kg)   SpO2 97%   BMI 40.42 kg/m     Wt Readings from Last 3 Encounters:  04/15/22 298 lb (135.2 kg)  03/20/22 300 lb (136.1 kg)  03/05/22 (!) 309 lb 15.5 oz (140.6 kg)     GEN:  Well nourished, well developed in no acute distress HEENT: Normal NECK: No JVD; No carotid bruits LYMPHATICS: No lymphadenopathy CARDIAC: RRR, no murmurs, rubs, gallops RESPIRATORY:  Clear to auscultation without rales, wheezing or rhonchi  ABDOMEN: Soft, non-tender, non-distended MUSCULOSKELETAL:  No edema; No deformity  SKIN: Warm and dry NEUROLOGIC:  Alert and oriented x 3 PSYCHIATRIC:  Normal affect    Signed, Shirlee More, MD  04/15/2022 11:50 AM    [place]

## 2022-04-15 ENCOUNTER — Ambulatory Visit: Payer: Medicare Other | Admitting: Cardiology

## 2022-04-15 ENCOUNTER — Encounter: Payer: Self-pay | Admitting: Cardiology

## 2022-04-15 VITALS — BP 158/82 | HR 84 | Ht 72.0 in | Wt 298.0 lb

## 2022-04-15 DIAGNOSIS — Z7901 Long term (current) use of anticoagulants: Secondary | ICD-10-CM

## 2022-04-15 DIAGNOSIS — I482 Chronic atrial fibrillation, unspecified: Secondary | ICD-10-CM | POA: Diagnosis not present

## 2022-04-15 DIAGNOSIS — R7881 Bacteremia: Secondary | ICD-10-CM | POA: Diagnosis not present

## 2022-04-15 DIAGNOSIS — I110 Hypertensive heart disease with heart failure: Secondary | ICD-10-CM | POA: Diagnosis not present

## 2022-04-15 NOTE — Patient Instructions (Signed)
Medication Instructions:  Your physician recommends that you continue on your current medications as directed. Please refer to the Current Medication list given to you today.  *If you need a refill on your cardiac medications before your next appointment, please call your pharmacy*   Lab Work: Your physician recommends that you return for lab work in:   Labs today: CBC, CRP, CMP, Sed Rate  If you have labs (blood work) drawn today and your tests are completely normal, you will receive your results only by: [place] (if you have MyChart) OR A paper copy in the mail If you have any lab test that is abnormal or we need to change your treatment, we will call you to review the results.   Testing/Procedures: None   Follow-Up: At [facility], you and your health needs are our priority.  As part of our continuing mission to provide you with exceptional heart care, we have created designated Provider Care Teams.  These Care Teams include your primary Cardiologist (physician) and Advanced Practice Providers (APPs -  Physician Assistants and Nurse Practitioners) who all work together to provide you with the care you need, when you need it.  We recommend signing up for the patient portal called "MyChart".  Sign up information is provided on this After Visit Summary.  MyChart is used to connect with patients for Virtual Visits (Telemedicine).  Patients are able to view lab/test results, encounter notes, upcoming appointments, etc.  Non-urgent messages can be sent to your provider as well.   To learn more about what you can do with MyChart, go to [email or web address].    Your next appointment:   6 month(s)  The format for your next appointment:   In Person  Provider:   Shirlee More, MD    Other Instructions None  Important Information About [place]

## 2022-04-16 ENCOUNTER — Other Ambulatory Visit: Payer: Self-pay | Admitting: Podiatry

## 2022-04-16 ENCOUNTER — Encounter: Payer: Self-pay | Admitting: Podiatry

## 2022-04-16 ENCOUNTER — Ambulatory Visit (INDEPENDENT_AMBULATORY_CARE_PROVIDER_SITE_OTHER): Payer: Medicare Other

## 2022-04-16 ENCOUNTER — Ambulatory Visit (INDEPENDENT_AMBULATORY_CARE_PROVIDER_SITE_OTHER): Payer: Medicare Other | Admitting: Podiatry

## 2022-04-16 DIAGNOSIS — L97522 Non-pressure chronic ulcer of other part of left foot with fat layer exposed: Secondary | ICD-10-CM

## 2022-04-16 DIAGNOSIS — L03032 Cellulitis of left toe: Secondary | ICD-10-CM | POA: Diagnosis not present

## 2022-04-16 DIAGNOSIS — L02612 Cutaneous abscess of left foot: Secondary | ICD-10-CM | POA: Diagnosis not present

## 2022-04-16 LAB — COMPREHENSIVE METABOLIC PANEL
ALT: 10 IU/L (ref 0–44)
AST: 13 IU/L (ref 0–40)
Albumin/Globulin Ratio: 1.3 (ref 1.2–2.2)
Albumin: 4.5 g/dL (ref 3.8–4.8)
Alkaline Phosphatase: 131 IU/L — ABNORMAL HIGH (ref 44–121)
BUN/Creatinine Ratio: 14 (ref 10–24)
BUN: 14 mg/dL (ref 8–27)
Bilirubin Total: 0.5 mg/dL (ref 0.0–1.2)
CO2: 26 mmol/L (ref 20–29)
Calcium: 9.7 mg/dL (ref 8.6–10.2)
Chloride: 101 mmol/L (ref 96–106)
Creatinine, Ser: 1.01 mg/dL (ref 0.76–1.27)
Globulin, Total: 3.5 g/dL (ref 1.5–4.5)
Glucose: 113 mg/dL — ABNORMAL HIGH (ref 70–99)
Potassium: 5.2 mmol/L (ref 3.5–5.2)
Sodium: 141 mmol/L (ref 134–144)
Total Protein: 8 g/dL (ref 6.0–8.5)
eGFR: 80 mL/min/{1.73_m2} (ref >59)

## 2022-04-16 LAB — CBC
Hematocrit: 40.2 % (ref 37.5–51.0)
Hemoglobin: 13.3 g/dL (ref 13.0–17.7)
MCH: 28.4 pg (ref 26.6–33.0)
MCHC: 33.1 g/dL (ref 31.5–35.7)
MCV: 86 fL (ref 79–97)
Platelets: 329 10*3/uL (ref 150–450)
RBC: 4.68 x10E6/uL (ref 4.14–5.80)
RDW: 14.5 % (ref 11.6–15.4)
WBC: 7.4 10*3/uL (ref 3.4–10.8)

## 2022-04-16 LAB — SEDIMENTATION RATE: Sed Rate: 46 mm/hr — ABNORMAL HIGH (ref 0–30)

## 2022-04-16 LAB — C-REACTIVE PROTEIN: CRP: 19 mg/L — ABNORMAL HIGH (ref 0–10)

## 2022-04-16 MED ORDER — SULFAMETHOXAZOLE-TRIMETHOPRIM 800-160 MG PO TABS: 1.0000 | ORAL_TABLET | Freq: Two times a day (BID) | ORAL | 1 refills | Status: DC

## 2022-04-16 NOTE — Progress Notes (Signed)
Subjective:  71 y.o. male with PMHx of second toe amputations bilateral 03/03/2022 and foot drop presenting today for new complaint of an ulcer that developed down to the third digit of the left foot.  Patient just completed IV antibiotics through his PICC line on 04/13/2022.  Unfortunately after completion of the antibiotics he has developed redness with swelling to the adjacent toe of the left foot.  He presents for further treatment and evaluation   Past Medical History:  Diagnosis Date   A-fib 10/08/2016   Aortic stenosis 11/06/2017   Atherosclerotic heart disease of native coronary artery with unspecified angina pectoris ([place]) 10/08/2016   Cellulitis 11/2019   right lower extremity   CHF (congestive heart failure) ([place])    Chronic anticoagulation 10/08/2016   Chronic combined systolic and diastolic heart failure ([place]) 10/08/2016   EF range 35-45% in 2016   Chronic congestive heart failure ([place])    Chronic ulcer of toe ([place]) 08/04/2019   Coagulation disorder ([place]) 04/30/2019   Foot drop- R 10/08/2016   Patient known foot drop for many years due to lumbar spinal stenosis.-Over the past couple of months symptoms have gotten worse.  He feels he is tripping more and cannot lift his foot up as much. Toes are curling and calluses on end of toes.    - Did see spinal surgeon many yrs ago.  Declined sx at that time.      Generalized OA 10/08/2016   Ortho doc-  [facility]. [facility] in [place]- both knees replaced 08, 09.      H/O noncompliance with medical treatment, presenting hazards to health 10/22/2017   Heart disease    Hypertension    Hypertensive heart disease with heart failure ([place]) 11/10/2017   Longstanding persistent atrial fibrillation (HCC)    Obesity, Class III, BMI 40-49.9 (morbid obesity) ([place]) 10/08/2016   OSA (obstructive sleep apnea) 10/08/2016   Bi-PAP nitely- been 20+ yrs now   Osteoarthritis 11/19/2017   Pulmonary hypertension ([place]) 10/08/2016   RBBB (right bundle branch block with left  anterior fascicular block) 11/07/2017   Sleep apnea with use of continuous positive airway pressure (CPAP) 10/08/2016   Bi-PAP nitely- been 20+ yrs now   Spinal stenosis, lumbar region, with neurogenic claudication 10/08/2016   Back specialist in [place]-    only surgeon he would use- pt declined referral here for back pain mgt     Stroke [facility])    Past Surgical History:  Procedure Laterality Date   AMPUTATION TOE Bilateral 03/03/2022   Procedure: BILATERAL 2ND DIGIT AMPUTATION;  Surgeon: [name], DPM;  Location: [facility];  Service: Podiatry;  Laterality: Bilateral;   HAMMER TOE SURGERY Right 09/28/2021   Middle toe   I & D EXTREMITY Right 10/30/2019   Procedure: IRRIGATION AND DEBRIDEMENT EXTREMITY, right leg possible wound vac placement;  Surgeon: Marchia Bond, MD;  Location: [place];  Service: Orthopedics;  Laterality: Right;   REPLACEMENT TOTAL KNEE Bilateral    TEE WITHOUT CARDIOVERSION N/A 03/05/2022   Procedure: TRANSESOPHAGEAL ECHOCARDIOGRAM (TEE);  Surgeon: Minna Merritts, MD;  Location: [facility];  Service: Cardiovascular;  Laterality: N/A;   TIBIA FRACTURE SURGERY     VITRECTOMY PARS PLANA 23 GAUGE FOR ENDOPHTHALMITIS Left 03/04/2020   Procedure: VITRECTOMY PARS PLANA 23 GAUGE FOR ENDOPHTHALMITIS;  Surgeon: Jalene Mullet, MD;  Location: [place];  Service: Ophthalmology;  Laterality: Left;   Allergies  Allergen Reactions   Cephalexin Rash   Dilaudid [Hydromorphone] Other (See Comments)  Hallucinations         Objective/Physical Exam General: The patient is alert and oriented x3 in no acute distress.  Dermatology:  Wound noted to the distal tip of the left third toe with associated erythema and edema.  No malodor. To the noted ulceration(s), there is no eschar. There is a moderate amount of slough, fibrin, and necrotic tissue noted.  Minimal granulation tissue.  Serous drainage.  There is no exposed bone muscle-tendon ligament or joint.   Vascular: Palpable pedal pulses  bilaterally.  Erythema with edema noted mostly to the second and third digits.  Capillary refill within normal limits.  Neurological: Epicritic and protective threshold absent bilaterally.   Musculoskeletal Exam: History of prior second toe amputations bilateral.  Radiographic exam LT foot 04/16/2022: Absence of the second digit noted.  No osseous erosions or cortical irregularities that would be suggestive of osteomyelitis.  Cortices of the third toe also intact.  Assessment: 1.  Ulcer distal tip of the left third toe 2.  Cellulitis third toe left and forefoot   Plan of Care:  1. Patient was evaluated. 2. medically necessary excisional debridement including subcutaneous tissue was performed using a tissue nipper and a chisel blade. Excisional debridement of all the necrotic nonviable tissue down to healthy bleeding viable tissue was performed with post-debridement measurements same as pre-. 3. the wound was cleansed and dry sterile dressing applied. 4.  Betadine ointment applied.  Betadine ointment provided to apply daily  5.  Cultures taken and sent to pathology for culture and sensitivity  6.  Prescription for Bactrim DS  7.  Patient is to return to clinic in 2 weeks.   Edrick Kins, DPM [facility]  Dr. Edrick Kins, DPM    [address], [place] [address]                Office [phone]  Fax [phone]

## 2022-04-16 NOTE — Addendum Note (Signed)
Addended by: Lolita Rieger on: 04/16/2022 04:51 PM   Modules accepted: Orders

## 2022-04-17 ENCOUNTER — Encounter: Payer: Medicare Other | Admitting: Podiatry

## 2022-04-18 ENCOUNTER — Encounter: Payer: Self-pay | Admitting: Infectious Diseases

## 2022-04-18 ENCOUNTER — Encounter: Payer: Self-pay | Admitting: Podiatry

## 2022-04-19 LAB — WOUND CULTURE

## 2022-04-20 ENCOUNTER — Encounter: Payer: Self-pay | Admitting: Podiatry

## 2022-04-22 NOTE — Telephone Encounter (Signed)
Please advise 

## 2022-04-22 NOTE — Telephone Encounter (Signed)
Please advise/ schedule for a sooner appointment

## 2022-04-23 ENCOUNTER — Encounter: Payer: Medicare Other | Admitting: Podiatry

## 2022-04-24 ENCOUNTER — Encounter: Payer: Medicare Other | Admitting: Podiatry

## 2022-04-24 ENCOUNTER — Encounter: Payer: Self-pay | Admitting: Podiatry

## 2022-04-24 ENCOUNTER — Ambulatory Visit (INDEPENDENT_AMBULATORY_CARE_PROVIDER_SITE_OTHER): Payer: Medicare Other | Admitting: Podiatry

## 2022-04-24 DIAGNOSIS — L02612 Cutaneous abscess of left foot: Secondary | ICD-10-CM | POA: Diagnosis not present

## 2022-04-24 DIAGNOSIS — L03032 Cellulitis of left toe: Secondary | ICD-10-CM | POA: Diagnosis not present

## 2022-04-24 DIAGNOSIS — M869 Osteomyelitis, unspecified: Secondary | ICD-10-CM

## 2022-04-24 DIAGNOSIS — L97522 Non-pressure chronic ulcer of other part of left foot with fat layer exposed: Secondary | ICD-10-CM | POA: Diagnosis not present

## 2022-04-24 MED ORDER — AMOXICILLIN-POT CLAVULANATE 875-125 MG PO TABS: 1.0000 | ORAL_TABLET | Freq: Two times a day (BID) | ORAL | 0 refills | Status: DC

## 2022-04-24 NOTE — Progress Notes (Signed)
Subjective:  71 y.o. male with PMHx of second toe amputations bilateral 03/03/2022 and foot drop presenting today for worsening infection of an ulcer that developed to the third digit of the left foot.  Patient had just completed IV antibiotics through his PICC line on 04/13/2022 and unfortunately after completion of the antibiotics he developed redness with swelling to the adjacent toe of the left foot only about 3 days after discontinuing the IV antibiotics.  He presents for further treatment and evaluation   Past Medical History:  Diagnosis Date   A-fib 10/08/2016   Aortic stenosis 11/06/2017   Atherosclerotic heart disease of native coronary artery with unspecified angina pectoris ([place]) 10/08/2016   Cellulitis 11/2019   right lower extremity   CHF (congestive heart failure) ([place])    Chronic anticoagulation 10/08/2016   Chronic combined systolic and diastolic heart failure ([place]) 10/08/2016   EF range 35-45% in 2016   Chronic congestive heart failure ([place])    Chronic ulcer of toe ([place]) 08/04/2019   Coagulation disorder ([place]) 04/30/2019   Foot drop- R 10/08/2016   Patient known foot drop for many years due to lumbar spinal stenosis.-Over the past couple of months symptoms have gotten worse.  He feels he is tripping more and cannot lift his foot up as much. Toes are curling and calluses on end of toes.    - Did see spinal surgeon many yrs ago.  Declined sx at that time.      Generalized OA 10/08/2016   Ortho doc-  [facility]. [facility] in [place]- both knees replaced 08, 09.      H/O noncompliance with medical treatment, presenting hazards to health 10/22/2017   Heart disease    Hypertension    Hypertensive heart disease with heart failure ([place]) 11/10/2017   Longstanding persistent atrial fibrillation (HCC)    Obesity, Class III, BMI 40-49.9 (morbid obesity) ([place]) 10/08/2016   OSA (obstructive sleep apnea) 10/08/2016   Bi-PAP nitely- been 20+ yrs now   Osteoarthritis 11/19/2017   Pulmonary hypertension  ([place]) 10/08/2016   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 fascicular block) 11/07/2017   Sleep apnea with use of continuous positive airway pressure (CPAP) 10/08/2016   Bi-PAP nitely- been 20+ yrs now   Spinal stenosis, lumbar region, with neurogenic claudication 10/08/2016   Back specialist in [place]-    only surgeon he would use- pt declined referral here for back pain mgt     Stroke [facility])    Past Surgical History:  Procedure Laterality Date   AMPUTATION TOE Bilateral 03/03/2022   Procedure: BILATERAL 2ND DIGIT AMPUTATION;  Surgeon: [name], DPM;  Location: [facility];  Service: Podiatry;  Laterality: Bilateral;   HAMMER TOE SURGERY Right 09/28/2021   Middle toe   I & D EXTREMITY Right 10/30/2019   Procedure: IRRIGATION AND DEBRIDEMENT EXTREMITY, right leg possible wound vac placement;  Surgeon: Marchia Bond, MD;  Location: [place];  Service: Orthopedics;  Laterality: Right;   REPLACEMENT TOTAL KNEE Bilateral    TEE WITHOUT CARDIOVERSION N/A 03/05/2022   Procedure: TRANSESOPHAGEAL ECHOCARDIOGRAM (TEE);  Surgeon: Minna Merritts, MD;  Location: [facility];  Service: Cardiovascular;  Laterality: N/A;   TIBIA FRACTURE SURGERY     VITRECTOMY PARS PLANA 23 GAUGE FOR ENDOPHTHALMITIS Left 03/04/2020   Procedure: VITRECTOMY PARS PLANA 23 GAUGE FOR ENDOPHTHALMITIS;  Surgeon: Jalene Mullet, MD;  Location: [place];  Service: Ophthalmology;  Laterality: Left;   Allergies  Allergen Reactions   Cephalexin Rash  Dilaudid [Hydromorphone] Other (See Comments)    Hallucinations         Objective/Physical Exam General: The patient is alert and oriented x3 in no acute distress.  Dermatology:  Progressively worsening wound noted to the distal tip of the left third toe with associated erythema and edema.  No malodor. To the noted ulceration(s), there is no eschar. There is a moderate amount of slough, fibrin, and necrotic tissue noted.  Minimal granulation tissue.  Serous drainage.  There is  no exposed bone muscle-tendon ligament or joint.   Vascular: Palpable pedal pulses bilaterally.  Erythema with edema noted mostly to the second and third digits.  Capillary refill within normal limits.  Neurological: Epicritic and protective threshold absent bilaterally.   Musculoskeletal Exam: History of prior second toe amputations bilateral.  Radiographic exam LT foot 04/16/2022: Absence of the second digit noted.  No osseous erosions or cortical irregularities that would be suggestive of osteomyelitis.  Cortices of the third toe also intact.  Assessment: 1.  Ulcer distal tip of the left third toe 2.  Cellulitis third toe left and forefoot   Plan of Care:  1. Patient was evaluated. 2.  Antibiotics were adjusted today based on cultures and sensitivities taken last visit.  Antibiotics changed to Augmentin 875/125 mg based on culture and sensitivities.  Discontinue Bactrim DS 3.  The distal tip of the toe continues to progressively get worse with increased swelling and edema.  There is concern now for osteomyelitis.  X-rays taken last visit were negative for osteomyelitis however we will go ahead and order an MRI of the toes left foot at [facility] 4.  Due to the worsening infection and progressive nature of the wound I do believe surgical amputation is warranted despite findings on MRI.  Most confident that there is likely bone infection in the distal tip of the toe.  The patient agrees and would like to have surgical amputation of the toe 5.  Surgery was discussed today which would include amputation third digit left foot.  Risk benefits advantages and disadvantages were explained in detail.  Possible complications explained.  No guarantees were expressed or implied. 6.  Authorization for surgery was initiated today.  Surgery will consist of third digit amputation left.  I would like to have the MRI results prior to surgery for surgical planning and to determine the extent of the  osteomyelitis 7.  Return to clinic 1 week postop    Edrick Kins, DPM [facility]  Dr. Edrick Kins, DPM    [address], [place] [address]                Office [phone]  Fax [phone]

## 2022-04-25 ENCOUNTER — Telehealth: Payer: Self-pay | Admitting: Urology

## 2022-04-29 ENCOUNTER — Encounter: Payer: Self-pay | Admitting: Cardiology

## 2022-04-29 ENCOUNTER — Encounter: Payer: Self-pay | Admitting: Podiatry

## 2022-05-01 ENCOUNTER — Other Ambulatory Visit: Payer: Medicare Other

## 2022-05-01 ENCOUNTER — Encounter: Payer: Self-pay | Admitting: Podiatry

## 2022-05-02 ENCOUNTER — Encounter: Payer: Medicare Other | Admitting: Podiatry

## 2022-05-03 ENCOUNTER — Other Ambulatory Visit: Payer: Self-pay | Admitting: Podiatry

## 2022-05-04 ENCOUNTER — Other Ambulatory Visit: Payer: Self-pay | Admitting: Physician Assistant

## 2022-05-04 DIAGNOSIS — E559 Vitamin D deficiency, unspecified: Secondary | ICD-10-CM

## 2022-05-05 ENCOUNTER — Ambulatory Visit
Admission: RE | Admit: 2022-05-05 | Discharge: 2022-05-05 | Disposition: A | Discharge status: Home / Self Care | Payer: Medicare Other | Source: Ambulatory Visit | Attending: Podiatry | Admitting: Podiatry

## 2022-05-05 DIAGNOSIS — M869 Osteomyelitis, unspecified: Secondary | ICD-10-CM

## 2022-05-05 DIAGNOSIS — L03032 Cellulitis of left toe: Secondary | ICD-10-CM

## 2022-05-05 DIAGNOSIS — L97522 Non-pressure chronic ulcer of other part of left foot with fat layer exposed: Secondary | ICD-10-CM

## 2022-05-09 ENCOUNTER — Other Ambulatory Visit: Payer: Self-pay | Admitting: Podiatry

## 2022-05-09 DIAGNOSIS — L97522 Non-pressure chronic ulcer of other part of left foot with fat layer exposed: Secondary | ICD-10-CM | POA: Diagnosis not present

## 2022-05-09 MED ORDER — HYDROCODONE-ACETAMINOPHEN 10-325 MG PO TABS: 1.0000 | ORAL_TABLET | ORAL | 0 refills | Status: AC | PRN

## 2022-05-09 NOTE — Progress Notes (Unsigned)
PRN postop 

## 2022-05-13 ENCOUNTER — Encounter: Payer: Medicare Other | Admitting: Podiatry

## 2022-05-13 ENCOUNTER — Other Ambulatory Visit: Payer: Self-pay | Admitting: Cardiology

## 2022-05-13 DIAGNOSIS — I110 Hypertensive heart disease with heart failure: Secondary | ICD-10-CM

## 2022-05-15 ENCOUNTER — Ambulatory Visit (INDEPENDENT_AMBULATORY_CARE_PROVIDER_SITE_OTHER): Payer: Medicare Other | Admitting: Podiatry

## 2022-05-15 DIAGNOSIS — Z9889 Other specified postprocedural states: Secondary | ICD-10-CM

## 2022-05-15 MED ORDER — AMOXICILLIN-POT CLAVULANATE 875-125 MG PO TABS: 1.0000 | ORAL_TABLET | Freq: Two times a day (BID) | ORAL | 0 refills | Status: DC

## 2022-05-22 ENCOUNTER — Ambulatory Visit (INDEPENDENT_AMBULATORY_CARE_PROVIDER_SITE_OTHER): Payer: Medicare Other

## 2022-05-22 ENCOUNTER — Ambulatory Visit (INDEPENDENT_AMBULATORY_CARE_PROVIDER_SITE_OTHER): Payer: Medicare Other | Admitting: Podiatry

## 2022-05-22 DIAGNOSIS — Z9889 Other specified postprocedural states: Secondary | ICD-10-CM

## 2022-05-22 MED ORDER — AMOXICILLIN-POT CLAVULANATE 875-125 MG PO TABS: 1.0000 | ORAL_TABLET | Freq: Two times a day (BID) | ORAL | 0 refills | Status: DC

## 2022-05-22 NOTE — Progress Notes (Signed)
No chief complaint on file.   Subjective:  Patient presents today status post left third toe amputation. DOS: 05/09/2022.  Patient states that he is doing much better.  He has been applying Betadine with dry sterile dressings every other day.  He is also been weightbearing in the surgical shoe as instructed.  No new complaints at this time  Past Medical History:  Diagnosis Date   A-fib 10/08/2016   Aortic stenosis 11/06/2017   Atherosclerotic heart disease of native coronary artery with unspecified angina pectoris ([place]) 10/08/2016   Cellulitis 11/2019   right lower extremity   CHF (congestive heart failure) ([place])    Chronic anticoagulation 10/08/2016   Chronic combined systolic and diastolic heart failure ([place]) 10/08/2016   EF range 35-45% in 2016   Chronic congestive heart failure ([place])    Chronic ulcer of toe ([place]) 08/04/2019   Coagulation disorder ([place]) 04/30/2019   Foot drop- R 10/08/2016   Patient known foot drop for many years due to lumbar spinal stenosis.-Over the past couple of months symptoms have gotten worse.  He feels he is tripping more and cannot lift his foot up as much. Toes are curling and calluses on end of toes.    - Did see spinal surgeon many yrs ago.  Declined sx at that time.      Generalized OA 10/08/2016   Ortho doc-  Dr. [facility] in [place]- both knees replaced 08, 09.      H/O noncompliance with medical treatment, presenting hazards to health 10/22/2017   Heart disease    Hypertension    Hypertensive heart disease with heart failure ([place]) 11/10/2017   Longstanding persistent atrial fibrillation (HCC)    Obesity, Class III, BMI 40-49.9 (morbid obesity) ([place]) 10/08/2016   OSA (obstructive sleep apnea) 10/08/2016   Bi-PAP nitely- been 20+ yrs now   Osteoarthritis 11/19/2017   Pulmonary hypertension ([place]) 10/08/2016   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 fascicular block) 11/07/2017   Sleep apnea with use of continuous positive airway pressure (CPAP)  10/08/2016   Bi-PAP nitely- been 20+ yrs now   Spinal stenosis, lumbar region, with neurogenic claudication 10/08/2016   Back specialist in [place]-    only surgeon he would use- pt declined referral here for back pain mgt     Stroke [facility])       Objective/Physical Exam Neurovascular status intact.  Skin incisions appear to be well coapted with sutures intact.  There is some slight drainage to the central portion of the incision site with some skin maceration.  No active bleeding.  No malodor.  Overall there does appear to be some improvement compared to last week  Radiographic Exam LT foot 05/22/2022:  Absence of the third digit noted at the level of the MTP joint.  Partial toe amputation noted second digit LT foot  Assessment: 1. s/p third toe amputation LT foot. DOS: 05/09/2022   Plan of Care:  1. Patient was evaluated. X-rays reviewed 2.  Refill prescription for Augmentin 875/125 mg BID #20.  I would like to have the patient continue the antibiotics until next visit 3.  Continue Betadine wet-to-dry dressings every other day 4.  Continue minimal weightbearing in the postsurgical shoe 5.  Return to clinic 2 weeks for possible suture removal   Edrick Kins, DPM [facility]  Dr. Edrick Kins, DPM    [address]  [place], [place] [address]                Office [phone]  Fax [phone]

## 2022-05-22 NOTE — Addendum Note (Signed)
Addended by: Myles Rosenthal on: 05/22/2022 09:48 AM   Modules accepted: Orders

## 2022-05-24 NOTE — Progress Notes (Signed)
Chief Complaint  Patient presents with   post op left foot    Post op left foot    Subjective:  Patient presents today status post left third toe amputation. DOS: 05/09/2022.  Patient states that he is doing well.  He is kept the dressings clean dry and intact.  He is weightbearing in surgical shoe.  No new complaints at this time  Past Medical History:  Diagnosis Date   A-fib 10/08/2016   Aortic stenosis 11/06/2017   Atherosclerotic heart disease of native coronary artery with unspecified angina pectoris ([place]) 10/08/2016   Cellulitis 11/2019   right lower extremity   CHF (congestive heart failure) ([place])    Chronic anticoagulation 10/08/2016   Chronic combined systolic and diastolic heart failure ([place]) 10/08/2016   EF range 35-45% in 2016   Chronic congestive heart failure ([place])    Chronic ulcer of toe ([place]) 08/04/2019   Coagulation disorder ([place]) 04/30/2019   Foot drop- R 10/08/2016   Patient known foot drop for many years due to lumbar spinal stenosis.-Over the past couple of months symptoms have gotten worse.  He feels he is tripping more and cannot lift his foot up as much. Toes are curling and calluses on end of toes.    - Did see spinal surgeon many yrs ago.  Declined sx at that time.      Generalized OA 10/08/2016   Ortho doc-  Dr. [facility] in [place]- both knees replaced 08, 09.      H/O noncompliance with medical treatment, presenting hazards to health 10/22/2017   Heart disease    Hypertension    Hypertensive heart disease with heart failure ([place]) 11/10/2017   Longstanding persistent atrial fibrillation (HCC)    Obesity, Class III, BMI 40-49.9 (morbid obesity) ([place]) 10/08/2016   OSA (obstructive sleep apnea) 10/08/2016   Bi-PAP nitely- been 20+ yrs now   Osteoarthritis 11/19/2017   Pulmonary hypertension ([place]) 10/08/2016   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 fascicular block) 11/07/2017   Sleep apnea with use of continuous positive airway pressure (CPAP) 10/08/2016    Bi-PAP nitely- been 20+ yrs now   Spinal stenosis, lumbar region, with neurogenic claudication 10/08/2016   Back specialist in [place]-    only surgeon he would use- pt declined referral here for back pain mgt     Stroke [facility])        Objective/Physical Exam Neurovascular status intact.  Skin incisions appear to be well coapted with sutures intact.  There is some maceration noted to the amputation site.  Minimal erythema.  Negative for any significant edema.  No malodor.  No active bleeding and only a very small amount of serous drainage.   Assessment: 1. s/p left third toe amputation. DOS: 05/09/2022   Plan of Care:  1. Patient was evaluated.  2.  Betadine wet-to-dry dressings were applied to the foot 3.  Continue minimal weightbearing in the surgical shoe 4.  Refill prescription for Augmentin 875/125 mg 5.  Return to clinic 1 week   Edrick Kins, DPM [facility]  Dr. Edrick Kins, DPM    [address], [place] [address]                Office [phone]  Fax [phone]

## 2022-05-27 ENCOUNTER — Encounter: Payer: Medicare Other | Admitting: Podiatry

## 2022-06-05 ENCOUNTER — Encounter: Payer: Self-pay | Admitting: Podiatry

## 2022-06-05 ENCOUNTER — Ambulatory Visit (INDEPENDENT_AMBULATORY_CARE_PROVIDER_SITE_OTHER): Payer: Medicare Other | Admitting: Podiatry

## 2022-06-05 DIAGNOSIS — Z9889 Other specified postprocedural states: Secondary | ICD-10-CM

## 2022-06-10 ENCOUNTER — Encounter: Payer: Medicare Other | Admitting: Podiatry

## 2022-06-10 NOTE — Progress Notes (Signed)
Chief Complaint  Patient presents with   Post-op Follow-up    POV #2 DOS 05/09/2022 AMPUTATION LT 3RD TOE    Subjective:  Patient presents today status post left third toe amputation. DOS: 05/09/2022.  Patient states that he is doing much better.  He has been applying Betadine with dry sterile dressings every other day.  He is also been weightbearing in the surgical shoe as instructed.  No new complaints at this time  Past Medical History:  Diagnosis Date   A-fib 10/08/2016   Aortic stenosis 11/06/2017   Atherosclerotic heart disease of native coronary artery with unspecified angina pectoris ([place]) 10/08/2016   Cellulitis 11/2019   right lower extremity   CHF (congestive heart failure) ([place])    Chronic anticoagulation 10/08/2016   Chronic combined systolic and diastolic heart failure ([place]) 10/08/2016   EF range 35-45% in 2016   Chronic congestive heart failure ([place])    Chronic ulcer of toe ([place]) 08/04/2019   Coagulation disorder ([place]) 04/30/2019   Foot drop- R 10/08/2016   Patient known foot drop for many years due to lumbar spinal stenosis.-Over the past couple of months symptoms have gotten worse.  He feels he is tripping more and cannot lift his foot up as much. Toes are curling and calluses on end of toes.    - Did see spinal surgeon many yrs ago.  Declined sx at that time.      Generalized OA 10/08/2016   Ortho doc-  Dr. [facility] in [place]- both knees replaced 08, 09.      H/O noncompliance with medical treatment, presenting hazards to health 10/22/2017   Heart disease    Hypertension    Hypertensive heart disease with heart failure ([place]) 11/10/2017   Longstanding persistent atrial fibrillation (HCC)    Obesity, Class III, BMI 40-49.9 (morbid obesity) ([place]) 10/08/2016   OSA (obstructive sleep apnea) 10/08/2016   Bi-PAP nitely- been 20+ yrs now   Osteoarthritis 11/19/2017   Pulmonary hypertension ([place]) 10/08/2016   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 fascicular block)  11/07/2017   Sleep apnea with use of continuous positive airway pressure (CPAP) 10/08/2016   Bi-PAP nitely- been 20+ yrs now   Spinal stenosis, lumbar region, with neurogenic claudication 10/08/2016   Back specialist in [place]-    only surgeon he would use- pt declined referral here for back pain mgt     Stroke [facility])      Objective/Physical Exam Neurovascular status intact.  Sutures are intact but after removal of the sutures and debridement of the callused area around the incision site there is some slight dehiscence along the incision site.  No exposed bone.  It appears very superficial and stable with a good granular wound base.  Minimal serosanguineous drainage.  No appreciable erythema or edema around the foot.  Radiographic Exam LT foot 05/22/2022:  Absence of the third digit noted at the level of the MTP joint.  Partial toe amputation noted second digit LT foot  Assessment: 1. s/p third toe amputation LT foot. DOS: 05/09/2022   Plan of Care:  1. Patient was evaluated. X-rays reviewed 2.  Patient has completed the Augmentin 875/125 mg as prescribed.  No refills provided. 3.  Recommend antibiotic ointment and a light Band-Aid daily to the amputation/incision site 4.  Continue postsurgical shoe or a wide fitting shoe that does not constrict the forefoot 5.  Return to clinic in 3 weeks for follow-up x-ray    Edrick Kins, DPM  [facility]  Dr. Edrick Kins, DPM    [address], [place] [address]                Office [phone]  Fax [phone]

## 2022-06-13 ENCOUNTER — Encounter: Payer: Self-pay | Admitting: Physician Assistant

## 2022-06-13 DIAGNOSIS — M869 Osteomyelitis, unspecified: Secondary | ICD-10-CM

## 2022-06-26 ENCOUNTER — Ambulatory Visit (INDEPENDENT_AMBULATORY_CARE_PROVIDER_SITE_OTHER): Payer: Medicare Other | Admitting: Podiatry

## 2022-06-26 ENCOUNTER — Ambulatory Visit (INDEPENDENT_AMBULATORY_CARE_PROVIDER_SITE_OTHER): Payer: Medicare Other

## 2022-06-26 DIAGNOSIS — Z9889 Other specified postprocedural states: Secondary | ICD-10-CM

## 2022-06-26 MED ORDER — GENTAMICIN SULFATE 0.1 % EX CREA: 1.0000 | TOPICAL_CREAM | Freq: Two times a day (BID) | CUTANEOUS | 1 refills | Status: DC

## 2022-06-26 MED ORDER — AMOXICILLIN-POT CLAVULANATE 875-125 MG PO TABS: 1.0000 | ORAL_TABLET | Freq: Two times a day (BID) | ORAL | 0 refills | Status: DC

## 2022-06-26 NOTE — Progress Notes (Signed)
Chief Complaint  Patient presents with   Post-op Follow-up    POV #4 DOS 05/09/2022 AMPUTATION LT 3RD TOE    Subjective:  Patient presents today status post left third toe amputation. DOS: 05/09/2022.  Patient continues to do very well.  They have been applying Betadine with dry sterile dressings.  He is also wearing his diabetic shoes.  No new complaints at this time  Past Medical History:  Diagnosis Date   A-fib 10/08/2016   Aortic stenosis 11/06/2017   Atherosclerotic heart disease of native coronary artery with unspecified angina pectoris ([place]) 10/08/2016   Cellulitis 11/2019   right lower extremity   CHF (congestive heart failure) ([place])    Chronic anticoagulation 10/08/2016   Chronic combined systolic and diastolic heart failure ([place]) 10/08/2016   EF range 35-45% in 2016   Chronic congestive heart failure ([place])    Chronic ulcer of toe ([place]) 08/04/2019   Coagulation disorder ([place]) 04/30/2019   Foot drop- R 10/08/2016   Patient known foot drop for many years due to lumbar spinal stenosis.-Over the past couple of months symptoms have gotten worse.  He feels he is tripping more and cannot lift his foot up as much. Toes are curling and calluses on end of toes.    - Did see spinal surgeon many yrs ago.  Declined sx at that time.      Generalized OA 10/08/2016   Ortho doc-  Dr. [facility] in [place]- both knees replaced 08, 09.      H/O noncompliance with medical treatment, presenting hazards to health 10/22/2017   Heart disease    Hypertension    Hypertensive heart disease with heart failure ([place]) 11/10/2017   Longstanding persistent atrial fibrillation (HCC)    Obesity, Class III, BMI 40-49.9 (morbid obesity) ([place]) 10/08/2016   OSA (obstructive sleep apnea) 10/08/2016   Bi-PAP nitely- been 20+ yrs now   Osteoarthritis 11/19/2017   Pulmonary hypertension ([place]) 10/08/2016   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 fascicular block) 11/07/2017   Sleep apnea with use of continuous positive  airway pressure (CPAP) 10/08/2016   Bi-PAP nitely- been 20+ yrs now   Spinal stenosis, lumbar region, with neurogenic claudication 10/08/2016   Back specialist in [place]-    only surgeon he would use- pt declined referral here for back pain mgt     Stroke [facility])      Objective/Physical Exam Neurovascular status intact.  Skin incision mostly healed.  There is some very slight minimal drainage to the distal area of the incision site.  The erythema and edema to the foot has resolved.  Radiographic Exam LT foot 06/26/2022:  Absence of the third digit noted at the level of the MTP joint.  Partial toe amputation noted second digit LT foot.  There is some progression of the hammertoe now to the fourth digit of the foot.  Progression noted compared to prior x-rays  Assessment: 1. s/p third toe amputation LT foot. DOS: 05/09/2022   Plan of Care:  1. Patient was evaluated. X-rays reviewed 2.  Overall the patient is doing very well.  There continues to be some slight drainage from the incision site.  Refill prescription of the Augmentin 875/125 mg. 3.  Today there was no appreciable drainage.  Culture was contemplated but I did not feel it would have been beneficial and only cultured superficial skin flora 4.  Continue wearing diabetic shoes 5.  Prescription for gentamicin cream to apply over the incision site daily 6.  Return to clinic in 6 weeks for follow-up x-ray   Edrick Kins, DPM [facility]  Dr. Edrick Kins, DPM    [address], [place] [address]                Office [phone]  Fax [phone]

## 2022-07-01 ENCOUNTER — Telehealth: Payer: Self-pay | Admitting: Cardiology

## 2022-07-01 NOTE — Telephone Encounter (Signed)
Called patient and explained that Dr. Bettina Gavia has been on vacation and he will be back on Wednesday. I informed him that I will ask Dr. Bettina Gavia to complete the patient assistance forms on Wednesday when he returns. Patient was agreeable with this plan and had no further questions at this time.

## 2022-07-01 NOTE — Telephone Encounter (Signed)
Pt called stating he is waiting on an update on patient assistant forms

## 2022-07-05 ENCOUNTER — Encounter: Payer: Self-pay | Admitting: Cardiology

## 2022-07-09 ENCOUNTER — Other Ambulatory Visit: Payer: Self-pay

## 2022-07-09 DIAGNOSIS — I482 Chronic atrial fibrillation, unspecified: Secondary | ICD-10-CM

## 2022-07-09 MED ORDER — APIXABAN 5 MG PO TABS: 5.0000 mg | ORAL_TABLET | Freq: Two times a day (BID) | ORAL | 3 refills | Status: DC

## 2022-07-09 NOTE — Telephone Encounter (Signed)
Called patient with status of [place] patient assistance forms for Eliquis assistance. Informed patient that forms are ready for Dr Bettina Gavia to sign when he returns to office and will fax once they have been signed. Patient voiced concern about the mail order through his insurance being sent that would cost him $500.00. Instructed patient to contact his mail order to see if that order could be held off on sending until he gets decision from [facility] for assistance and that I have placed him a couple weeks sample supply at front to help till a response has been received. Patient voices understanding and that he will contact his mail order regarding the automatic shipment.

## 2022-07-09 NOTE — Telephone Encounter (Signed)
Patient called to follow-up on his patient assistance paperwork for Eliquis.

## 2022-07-10 ENCOUNTER — Other Ambulatory Visit: Payer: Self-pay

## 2022-07-10 NOTE — Telephone Encounter (Signed)
[place] patient assistance foundation form completed and requested documents faxed for Eliquis assistance. Will notify patient of outcome when received.

## 2022-07-15 ENCOUNTER — Telehealth: Payer: Self-pay

## 2022-07-15 NOTE — Telephone Encounter (Signed)
Patient approved for Eliquis assistance from [place] for 07/15/22 through 11/03/2022. Patient made aware and thanked me for calling.  Copy of letter in chart media

## 2022-08-02 ENCOUNTER — Telehealth: Payer: Self-pay | Admitting: *Deleted

## 2022-08-02 NOTE — Telephone Encounter (Signed)
Spoke with pt about the Eliquis samples for pt to pick up. He stated he had gotten his Rx so doesn't need the samples any more. Put samples back in our closet.

## 2022-08-07 ENCOUNTER — Ambulatory Visit: Payer: Medicare Other | Admitting: Podiatry

## 2022-08-07 ENCOUNTER — Ambulatory Visit (INDEPENDENT_AMBULATORY_CARE_PROVIDER_SITE_OTHER): Payer: Medicare Other

## 2022-08-07 DIAGNOSIS — Z9889 Other specified postprocedural states: Secondary | ICD-10-CM | POA: Diagnosis not present

## 2022-08-07 NOTE — Progress Notes (Signed)
Chief Complaint  Patient presents with   Post-op Follow-up    10POV #5 DOS 05/09/2022 AMPUTATION LT 3RD TOE, patient states that things are going well, no pain.    Subjective:  Patient presents today status post left third toe amputation. DOS: 05/09/2022.  Patient doing well.  He has no pain or tenderness to the area.  He believes that the incision site has nicely healed.  He is wearing his diabetic shoes.  Past Medical History:  Diagnosis Date   A-fib 10/08/2016   Aortic stenosis 11/06/2017   Atherosclerotic heart disease of native coronary artery with unspecified angina pectoris ([place]) 10/08/2016   Cellulitis 11/2019   right lower extremity   CHF (congestive heart failure) ([place])    Chronic anticoagulation 10/08/2016   Chronic combined systolic and diastolic heart failure ([place]) 10/08/2016   EF range 35-45% in 2016   Chronic congestive heart failure ([place])    Chronic ulcer of toe ([place]) 08/04/2019   Coagulation disorder ([place]) 04/30/2019   Foot drop- R 10/08/2016   Patient known foot drop for many years due to lumbar spinal stenosis.-Over the past couple of months symptoms have gotten worse.  He feels he is tripping more and cannot lift his foot up as much. Toes are curling and calluses on end of toes.    - Did see spinal surgeon many yrs ago.  Declined sx at that time.      Generalized OA 10/08/2016   Ortho doc-  Dr. [facility] in [place]- both knees replaced 08, 09.      H/O noncompliance with medical treatment, presenting hazards to health 10/22/2017   Heart disease    Hypertension    Hypertensive heart disease with heart failure ([place]) 11/10/2017   Longstanding persistent atrial fibrillation (HCC)    Obesity, Class III, BMI 40-49.9 (morbid obesity) ([place]) 10/08/2016   OSA (obstructive sleep apnea) 10/08/2016   Bi-PAP nitely- been 20+ yrs now   Osteoarthritis 11/19/2017   Pulmonary hypertension ([place]) 10/08/2016   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 fascicular block) 11/07/2017   Sleep  apnea with use of continuous positive airway pressure (CPAP) 10/08/2016   Bi-PAP nitely- been 20+ yrs now   Spinal stenosis, lumbar region, with neurogenic claudication 10/08/2016   Back specialist in [place]-    only surgeon he would use- pt declined referral here for back pain mgt     Stroke [facility])      Objective/Physical Exam Neurovascular status intact.  Skin incision nicely healed.  No erythema or edema.  Good healing of the surgical site which is healed and resolved  Radiographic Exam LT foot 06/26/2022:  Absence of the third digit noted at the level of the MTP joint.  Partial toe amputation noted second digit LT foot.  There is some progression of the hammertoe now to the fourth digit of the foot.  Progression noted compared to prior x-rays  Assessment: 1. s/p third toe amputation LT foot. DOS: 05/09/2022   Plan of Care:  1. Patient was evaluated.  2.  The amputation site has nicely healed.  No erythema or edema 3.  Continue wearing diabetic shoes.  Patient full activity no restrictions 4.  Return to clinic as needed   Edrick Kins, DPM [facility]  Dr. Edrick Kins, DPM    [address]  [place], [place] [address]                Office [phone]  Fax [phone]   

## 2022-09-04 ENCOUNTER — Encounter: Payer: Self-pay | Admitting: Physician Assistant

## 2022-09-04 ENCOUNTER — Ambulatory Visit (INDEPENDENT_AMBULATORY_CARE_PROVIDER_SITE_OTHER): Payer: Medicare Other | Admitting: Physician Assistant

## 2022-09-04 VITALS — BP 147/81 | HR 96 | Ht 72.0 in | Wt 298.0 lb

## 2022-09-04 DIAGNOSIS — G4733 Obstructive sleep apnea (adult) (pediatric): Secondary | ICD-10-CM

## 2022-09-04 DIAGNOSIS — I482 Chronic atrial fibrillation, unspecified: Secondary | ICD-10-CM

## 2022-09-04 DIAGNOSIS — Z1329 Encounter for screening for other suspected endocrine disorder: Secondary | ICD-10-CM

## 2022-09-04 DIAGNOSIS — Z23 Encounter for immunization: Secondary | ICD-10-CM

## 2022-09-04 DIAGNOSIS — I110 Hypertensive heart disease with heart failure: Secondary | ICD-10-CM | POA: Diagnosis not present

## 2022-09-04 DIAGNOSIS — E559 Vitamin D deficiency, unspecified: Secondary | ICD-10-CM

## 2022-09-04 DIAGNOSIS — I5032 Chronic diastolic (congestive) heart failure: Secondary | ICD-10-CM

## 2022-09-04 DIAGNOSIS — Z13228 Encounter for screening for other metabolic disorders: Secondary | ICD-10-CM

## 2022-09-04 DIAGNOSIS — L821 Other seborrheic keratosis: Secondary | ICD-10-CM

## 2022-09-04 DIAGNOSIS — Z130 Encounter for screening for diseases of the blood and blood-forming organs and certain disorders involving the immune mechanism: Secondary | ICD-10-CM

## 2022-09-04 DIAGNOSIS — Z1321 Encounter for screening for nutritional disorder: Secondary | ICD-10-CM

## 2022-09-04 NOTE — Assessment & Plan Note (Addendum)
-  Patient on Vitamin D 50,000 units once a week. Last Vitamin D 51.7, will repeat Vitamin D today. Pending results will adjust treatment plan if indicated.

## 2022-09-04 NOTE — Assessment & Plan Note (Addendum)
-  Stable. Followed by cardiology as well. Recommend to continue carvedilol 12.5 mg twice daily. Patient is also on spironolactone 25 mg daily.

## 2022-09-04 NOTE — Assessment & Plan Note (Signed)
>>  ASSESSMENT AND PLAN FOR CHRONIC CONGESTIVE HEART FAILURE ([place]) WRITTEN ON 09/04/2022 11:42 AM BY ABONZA, MARITZA, PA-C  -Followed by cardiology. -Patient on diuretic therapy with Bumetanide and Spironolactone.  -May 2023 echo: LVEF 55-60%.

## 2022-09-04 NOTE — Assessment & Plan Note (Signed)
-  Recommend to continue with CPAP compliance.

## 2022-09-04 NOTE — Assessment & Plan Note (Signed)
-  Followed by cardiology. -Patient on diuretic therapy with Bumetanide and Spironolactone.  -May 2023 echo: LVEF 55-60%.

## 2022-09-04 NOTE — Assessment & Plan Note (Signed)
>>  ASSESSMENT AND PLAN FOR CHRONIC COMBINED SYSTOLIC AND DIASTOLIC CHF (CONGESTIVE HEART FAILURE) (HCC) WRITTEN ON 01/21/2023  3:49 PM BY EDSTROM, MORGAN A, PA  >>ASSESSMENT AND PLAN FOR CHRONIC CONGESTIVE HEART FAILURE (HCC) WRITTEN ON 09/04/2022 11:42 AM BY ABONZA, MARITZA, PA-C  -Followed by cardiology. -Patient on diuretic therapy with Bumetanide  and Spironolactone .  -May 2023 echo: LVEF 55-60%.

## 2022-09-04 NOTE — Progress Notes (Signed)
Established patient visit   Patient: Joel Dominguez   DOB: October 14, 1951   71 y.o. Male  MRN: [id number] Visit Date: 09/04/2022  Chief Complaint  Patient presents with   Follow-up   Subjective    HPI  Patient presents for chronic follow-up. Patient reports left foot has finally healed and doing well. Patient reports using his CPAP machine every night for 7-8 hrs. Does reports getting over a cold and chest congestion. No chest pain, weight fluctuations, or palpitations. Denies increased lower extremity swelling. Patient's wife does check his blood pressure at home, reports most recent reading was 141/70. No increased intake with salt intake.     Medications: Outpatient Medications Prior to Visit  Medication Sig   apixaban (ELIQUIS) 5 MG TABS tablet Take 1 tablet (5 mg total) by mouth 2 (two) times daily.   B Complex-C (B-COMPLEX WITH VITAMIN C) tablet Take 1 tablet by mouth daily.   bumetanide (BUMEX) 1 MG tablet TAKE 1 TABLET BY MOUTH ONCE DAILY. TAKE AN ADDITIONAL TABLET ON MONDAY, WEDNESDAY, AND FRIDAY.   carvedilol (COREG) 12.5 MG tablet TAKE 1 TABLET BY MOUTH TWICE A DAY WITH A MEAL   gentamicin cream (GARAMYCIN) 0.1 % Apply 1 Application topically 2 (two) times daily.   hydrocerin (EUCERIN) CREA Apply 1 application topically 2 (two) times daily.   Multiple Vitamin (MULTIVITAMIN) tablet Take 1 tablet by mouth daily.   spironolactone (ALDACTONE) 25 MG tablet TAKE 1 TABLET BY MOUTH DAILY   Vitamin D, Ergocalciferol, (DRISDOL) 1.25 MG (50000 UNIT) CAPS capsule TAKE 1 CAPSULE BY MOUTH EVERY 7 DAYS.   [DISCONTINUED] amoxicillin-clavulanate (AUGMENTIN) 875-125 MG tablet Take 1 tablet by mouth 2 (two) times daily.   [DISCONTINUED] sulfamethoxazole-trimethoprim (BACTRIM DS) 800-160 MG tablet Take 1 tablet by mouth 2 (two) times daily.   No facility-administered medications prior to visit.    Review of Systems Review of Systems:  A fourteen system review of systems was performed and found  to be positive as per HPI.   Last CBC Lab Results  Component Value Date   WBC 7.4 04/15/2022   HGB 13.3 04/15/2022   HCT 40.2 04/15/2022   MCV 86 04/15/2022   MCH 28.4 04/15/2022   RDW 14.5 04/15/2022   PLT 329 47/42/5956   Last metabolic panel Lab Results  Component Value Date   GLUCOSE 113 (H) 04/15/2022   NA 141 04/15/2022   K 5.2 04/15/2022   CL 101 04/15/2022   CO2 26 04/15/2022   BUN 14 04/15/2022   CREATININE 1.01 04/15/2022   EGFR 80 04/15/2022   CALCIUM 9.7 04/15/2022   PHOS 4.0 11/01/2019   PROT 8.0 04/15/2022   ALBUMIN 4.5 04/15/2022   LABGLOB 3.5 04/15/2022   AGRATIO 1.3 04/15/2022   BILITOT 0.5 04/15/2022   ALKPHOS 131 (H) 04/15/2022   AST 13 04/15/2022   ALT 10 04/15/2022   ANIONGAP 6 03/07/2022   Last lipids Lab Results  Component Value Date   CHOL 184 09/05/2021   HDL 41 09/05/2021   LDLCALC 106 (H) 09/05/2021   TRIG 211 (H) 09/05/2021   CHOLHDL 4.5 09/05/2021   Last hemoglobin A1c Lab Results  Component Value Date   HGBA1C 5.4 09/05/2021   Last thyroid functions Lab Results  Component Value Date   TSH 1.090 10/16/2020   Last vitamin D Lab Results  Component Value Date   VD25OH 51.7 09/05/2021       Objective    BP (!) 147/81   Pulse 96   Ht 6' (  1.829 m)   Wt 298 lb (135.2 kg)   SpO2 100%   BMI 40.42 kg/m  BP Readings from Last 3 Encounters:  09/04/22 (!) 147/81  04/15/22 (!) 158/82  03/26/22 (!) 158/90   Wt Readings from Last 3 Encounters:  09/04/22 298 lb (135.2 kg)  04/15/22 298 lb (135.2 kg)  03/20/22 300 lb (136.1 kg)    Physical Exam  General:  Pleasant and cooperative, appropriate for stated age.  Neuro:  Alert and oriented,  extra-ocular muscles intact  HEENT:  Normocephalic, atraumatic, neck supple  Skin:  scattered brown macules  Cardiac:  IRR (chronic a-fib) Respiratory: CTA B/L  Vascular:  Ext warm, no cyanosis apprec.; cap RF less 2 sec. Psych:  No HI/SI, judgement and insight good, Euthymic mood.  Full Affect.   No results found for any visits on 09/04/22.  Assessment & Plan      Problem List Items Addressed This Visit       Cardiovascular and Mediastinum   Chronic atrial fibrillation (HCC) (Chronic)    -Followed by cardiology. Patient on carvedilol 12.5 mg twice daily and Elqiuis 5 mg twice daily.      Relevant Orders   Hemoglobin A1c   Lipid panel   TSH   CBC with Differential/Platelet   Comprehensive metabolic panel   Vitamin D (25 hydroxy)   Lipid panel   Hypertensive heart disease with heart failure ([place]) - Primary    -Stable. Followed by cardiology as well. Recommend to continue carvedilol 12.5 mg twice daily. Patient is also on spironolactone 25 mg daily.      Relevant Orders   Hemoglobin A1c   Lipid panel   TSH   CBC with Differential/Platelet   Comprehensive metabolic panel   Vitamin D (25 hydroxy)   Lipid panel   CBC with Differential/Platelet   Comprehensive metabolic panel   Chronic congestive heart failure ([place])    -Followed by cardiology. -Patient on diuretic therapy with Bumetanide and Spironolactone.  -May 2023 echo: LVEF 55-60%.      Relevant Orders   Hemoglobin A1c   Lipid panel   TSH   CBC with Differential/Platelet   Comprehensive metabolic panel   Vitamin D (25 hydroxy)   Lipid panel   CBC with Differential/Platelet   Comprehensive metabolic panel     Respiratory   OSA (obstructive sleep apnea)    -Recommend to continue with CPAP compliance.      Relevant Orders   Hemoglobin A1c   Lipid panel   TSH   CBC with Differential/Platelet   Comprehensive metabolic panel   Vitamin D (25 hydroxy)     Other   Vitamin D deficiency    -Patient on Vitamin D 50,000 units once a week. Last Vitamin D 51.7, will repeat Vitamin D today. Pending results will adjust treatment plan if indicated.      Relevant Orders   Vitamin D (25 hydroxy)   Other Visit Diagnoses     Need for influenza vaccination       Relevant Orders   Flu  Vaccine QUAD High Dose(Fluad) (Completed)   Screening for endocrine, nutritional, metabolic and immunity disorder       Relevant Orders   Hemoglobin A1c   Lipid panel   TSH   Seborrheic keratoses          Seborrheic keratosis: -Reassurance provided lesions are consistent with SK. Recommend to monitor for changes.   Patient is fasting so will collect routine fasting labs. Pt agreeable to flu  vaccine.  Return in about 3 months (around 12/05/2022) for [place].        Lorrene Reid, PA-C  [facility] [phone] (phone) [phone] (fax)  [place]

## 2022-09-04 NOTE — Assessment & Plan Note (Deleted)
-  Followed by cardiology. -Patient on diuretic therapy with Bumetanide and Spironolactone.  -May 2023 echo: LVEF 55-60%.

## 2022-09-04 NOTE — Assessment & Plan Note (Signed)
-  Followed by cardiology. Patient on carvedilol 12.5 mg twice daily and Elqiuis 5 mg twice daily.

## 2022-09-05 LAB — CBC WITH DIFFERENTIAL/PLATELET
Basophils Absolute: 0.1 10*3/uL (ref 0.0–0.2)
Basos: 1 %
EOS (ABSOLUTE): 0.4 10*3/uL (ref 0.0–0.4)
Eos: 5 %
Hematocrit: 44 % (ref 37.5–51.0)
Hemoglobin: 14.9 g/dL (ref 13.0–17.7)
Immature Grans (Abs): 0 10*3/uL (ref 0.0–0.1)
Immature Granulocytes: 0 %
Lymphocytes Absolute: 1.7 10*3/uL (ref 0.7–3.1)
Lymphs: 23 %
MCH: 29.4 pg (ref 26.6–33.0)
MCHC: 33.9 g/dL (ref 31.5–35.7)
MCV: 87 fL (ref 79–97)
Monocytes Absolute: 0.6 10*3/uL (ref 0.1–0.9)
Monocytes: 8 %
Neutrophils Absolute: 4.5 10*3/uL (ref 1.4–7.0)
Neutrophils: 63 %
Platelets: 228 10*3/uL (ref 150–450)
RBC: 5.06 x10E6/uL (ref 4.14–5.80)
RDW: 13.4 % (ref 11.6–15.4)
WBC: 7.2 10*3/uL (ref 3.4–10.8)

## 2022-09-05 LAB — HEMOGLOBIN A1C
Est. average glucose Bld gHb Est-mCnc: 117 mg/dL
Hgb A1c MFr Bld: 5.7 % — ABNORMAL HIGH (ref 4.8–5.6)

## 2022-09-05 LAB — COMPREHENSIVE METABOLIC PANEL
ALT: 13 IU/L (ref 0–44)
AST: 17 IU/L (ref 0–40)
Albumin/Globulin Ratio: 1.5 (ref 1.2–2.2)
Albumin: 4.7 g/dL (ref 3.8–4.8)
Alkaline Phosphatase: 92 IU/L (ref 44–121)
BUN/Creatinine Ratio: 27 — ABNORMAL HIGH (ref 10–24)
BUN: 25 mg/dL (ref 8–27)
Bilirubin Total: 0.7 mg/dL (ref 0.0–1.2)
CO2: 27 mmol/L (ref 20–29)
Calcium: 10 mg/dL (ref 8.6–10.2)
Chloride: 98 mmol/L (ref 96–106)
Creatinine, Ser: 0.93 mg/dL (ref 0.76–1.27)
Globulin, Total: 3.1 g/dL (ref 1.5–4.5)
Glucose: 107 mg/dL — ABNORMAL HIGH (ref 70–99)
Potassium: 4.6 mmol/L (ref 3.5–5.2)
Sodium: 139 mmol/L (ref 134–144)
Total Protein: 7.8 g/dL (ref 6.0–8.5)
eGFR: 88 mL/min/{1.73_m2} (ref >59)

## 2022-09-05 LAB — LIPID PANEL
Chol/HDL Ratio: 3.8 ratio (ref 0.0–5.0)
Cholesterol, Total: 156 mg/dL (ref 100–199)
HDL: 41 mg/dL (ref >39)
LDL Chol Calc (NIH): 89 mg/dL (ref 0–99)
Triglycerides: 146 mg/dL (ref 0–149)
VLDL Cholesterol Cal: 26 mg/dL (ref 5–40)

## 2022-09-05 LAB — TSH: TSH: 1.33 u[IU]/mL (ref 0.450–4.500)

## 2022-09-05 LAB — VITAMIN D 25 HYDROXY (VIT D DEFICIENCY, FRACTURES): Vit D, 25-Hydroxy: 73.1 ng/mL (ref 30.0–100.0)

## 2022-10-19 NOTE — Progress Notes (Signed)
Cardiology Office Note:    Date:  10/21/2022   ID:  Elouise Munroe, DOB 03/02/1951, MRN [id number]  PCP:  Mayer Masker, PA-C  Cardiologist:  Norman Herrlich, MD    Referring MD: Mayer Masker, PA-C    ASSESSMENT:    1. Chronic atrial fibrillation (HCC)   2. Chronic anticoagulation   3. Hypertensive heart disease with heart failure (HCC)   4. 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 fascicular block)   5. Nonrheumatic aortic valve stenosis    PLAN:    In order of problems listed above:  He continues to do well with rate controlled atrial fibrillation with rate control continue his beta-blocker and current anticoagulant.  Will continue to trend his heart rhythm at home and if he has bradycardia rates less than 45 to 50/min will send strips to me as a MyChart email Blood pressure well-controlled heart failure compensated continue his current loop and distal diuretic  stable conduction system disease monitor with home device Mild aortic stenosis mean echocardiogram in 1 year   Next appointment: 6 months   Medication Adjustments/Labs and Tests Ordered: Current medicines are reviewed at length with the patient today.  Concerns regarding medicines are outlined above.  No orders of the defined types were placed in this encounter.  No orders of the defined types were placed in this encounter.   Chief complaint follow-up for atrial fibrillation and heart failure   History of Present Illness:    Joel Dominguez is a 71 y.o. male with a hx of longstanding permanent atrial fibrillation mild aortic stenosis hypertensive heart disease or chronic diastolic heart failure chronic anticoagulation therapy bifascicular heart block last seen 04/15/2022 following a hospitalization in [facility] with cellulitis of the lower extremities.  During that hospitalization echocardiogram showed mildly reduced EF 45 to 50% and mild aortic stenosis he had enterococcal bacteremia  due to osteomyelitis and foot ulcer and was not found to have endocarditis..  Compliance with diet, lifestyle and medications: Yes  He has done well his osteomyelitis is healed he is off antibiotics. Purchase the mobile Kardia device and I reviewed his strips which showed rate controlled atrial fibrillation Home blood pressure runs less than 130 and 140/80 with good technique validated device He is not having edema shortness of breath chest pain palpitation or syncope Recent labs 09/06/2022 cholesterol 156 LDL 89 A1c 5.7 hemoglobin 32.3 creatinine 0.93 potassium 4.6 Past Medical History:  Diagnosis Date   A-fib 10/08/2016   Aortic stenosis 11/06/2017   Atherosclerotic heart disease of native coronary artery with unspecified angina pectoris (HCC) 10/08/2016   Cellulitis 11/2019   right lower extremity   CHF (congestive heart failure) (HCC)    Chronic anticoagulation 10/08/2016   Chronic combined systolic and diastolic heart failure (HCC) 10/08/2016   EF range 35-45% in 2016   Chronic congestive heart failure (HCC)    Chronic ulcer of toe (HCC) 08/04/2019   Coagulation disorder (HCC) 04/30/2019   Foot drop- R 10/08/2016   Patient known foot drop for many years due to lumbar spinal stenosis.-Over the past couple of months symptoms have gotten worse.  He feels he is tripping more and cannot lift his foot up as much. Toes are curling and calluses on end of toes.    - Did see spinal surgeon many yrs ago.  Declined sx at that time.      Generalized OA 10/08/2016   Ortho doc-  Dr. Jacqlyn Krauss in [place]- both knees replaced 08, 09.  H/O noncompliance with medical treatment, presenting hazards to health 10/22/2017   Heart disease    Hypertension    Hypertensive heart disease with heart failure (HCC) 11/10/2017   Longstanding persistent atrial fibrillation (HCC)    Obesity, Class III, BMI 40-49.9 (morbid obesity) (HCC) 10/08/2016   OSA (obstructive sleep apnea) 10/08/2016   Bi-PAP nitely- been 20+ yrs  now   Osteoarthritis 11/19/2017   Pulmonary hypertension (HCC) 10/08/2016   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 fascicular block) 11/07/2017   Sleep apnea with use of continuous positive airway pressure (CPAP) 10/08/2016   Bi-PAP nitely- been 20+ yrs now   Spinal stenosis, lumbar region, with neurogenic claudication 10/08/2016   Back specialist in [place]-    only surgeon he would use- pt declined referral here for back pain mgt     Stroke [facility])     Past Surgical History:  Procedure Laterality Date   AMPUTATION TOE Bilateral 03/03/2022   Procedure: BILATERAL 2ND DIGIT AMPUTATION;  Surgeon: Candelaria Stagers, DPM;  Location: [facility] ORS;  Service: Podiatry;  Laterality: Bilateral;   HAMMER TOE SURGERY Right 09/28/2021   Middle toe   I & D EXTREMITY Right 10/30/2019   Procedure: IRRIGATION AND DEBRIDEMENT EXTREMITY, right leg possible wound vac placement;  Surgeon: Teryl Lucy, MD;  [facility];  Service: Orthopedics;  Laterality: Right;   REPLACEMENT TOTAL KNEE Bilateral    TEE WITHOUT CARDIOVERSION N/A 03/05/2022   Procedure: TRANSESOPHAGEAL ECHOCARDIOGRAM (TEE);  Surgeon: Antonieta Iba, MD;  Location: [facility] ORS;  Service: Cardiovascular;  Laterality: N/A;   TIBIA FRACTURE SURGERY     VITRECTOMY PARS PLANA 23 GAUGE FOR ENDOPHTHALMITIS Left 03/04/2020   Procedure: VITRECTOMY PARS PLANA 23 GAUGE FOR ENDOPHTHALMITIS;  Surgeon: Carmela Rima, MD;  Location: [facility];  Service: Ophthalmology;  Laterality: Left;    Current Medications: Current Meds  Medication Sig   apixaban (ELIQUIS) 5 MG TABS tablet Take 1 tablet (5 mg total) by mouth 2 (two) times daily.   B Complex-C (B-COMPLEX WITH VITAMIN C) tablet Take 1 tablet by mouth daily.   bumetanide (BUMEX) 1 MG tablet TAKE 1 TABLET BY MOUTH ONCE DAILY. TAKE AN ADDITIONAL TABLET ON MONDAY, WEDNESDAY, AND FRIDAY.   carvedilol (COREG) 12.5 MG tablet TAKE 1 TABLET BY MOUTH TWICE A DAY WITH A MEAL   hydrocerin (EUCERIN) CREA Apply 1  application topically 2 (two) times daily.   Multiple Vitamin (MULTIVITAMIN) tablet Take 1 tablet by mouth daily.   spironolactone (ALDACTONE) 25 MG tablet TAKE 1 TABLET BY MOUTH DAILY   Vitamin D, Ergocalciferol, (DRISDOL) 1.25 MG (50000 UNIT) CAPS capsule TAKE 1 CAPSULE BY MOUTH EVERY 7 DAYS.     Allergies:   Cephalexin and Dilaudid [hydromorphone]   Social History   Socioeconomic History   Marital status: Married    Spouse name: Not on file   Number of children: Not on file   Years of education: Not on file   Highest education level: Not on file  Occupational History   Not on file  Tobacco Use   Smoking status: Never    Passive exposure: Never   Smokeless tobacco: Never  Vaping Use   Vaping Use: Never used  Substance and Sexual Activity   Alcohol use: Not Currently   Drug use: No   Sexual activity: Not on file  Other Topics Concern   Not on file  Social History Narrative   Not on file   Social Determinants of Health   Financial Resource  Strain: Not on file  Food Insecurity: Not on file  Transportation Needs: Not on file  Physical Activity: Not on file  Stress: Not on file  Social Connections: Not on file     Family History: The patient's family history includes Cancer in his father; Congestive Heart Failure in his mother and sister; Diabetes in his brother and mother; Hypertension in his brother, mother, sister, and sister. ROS:   Please see the history of present illness.    All other systems reviewed and are negative.  EKGs/Labs/Other Studies Reviewed:    The following studies were reviewed today:  EKG: I reviewed rhythm strips 5 all atrial fibrillation controlled rate  Recent Labs: 02/18/2022: BNP 155.2 03/05/2022: Magnesium 2.1 09/04/2022: ALT 13; BUN 25; Creatinine, Ser 0.93; Hemoglobin 14.9; Platelets 228; Potassium 4.6; Sodium 139; TSH 1.330  Recent Lipid Panel    Component Value Date/Time   CHOL 156 09/04/2022 1043   TRIG 146 09/04/2022 1043    HDL 41 09/04/2022 1043   CHOLHDL 3.8 09/04/2022 1043   CHOLHDL 4.3 10/08/2016 1144   VLDL 45 (H) 10/08/2016 1144   LDLCALC 89 09/04/2022 1043    Physical Exam:    VS:  BP (!) 157/83 (BP Location: Right Wrist)   Pulse (!) 54   Ht 6' (1.829 m)   Wt 300 lb (136.1 kg)   SpO2 95%   BMI 40.69 kg/m     Wt Readings from Last 3 Encounters:  10/21/22 300 lb (136.1 kg)  09/04/22 298 lb (135.2 kg)  04/15/22 298 lb (135.2 kg)     GEN:  Well nourished, well developed in no acute distress HEENT: Normal NECK: No JVD; No carotid bruits LYMPHATICS: No lymphadenopathy CARDIAC: Irregular rate and rhythm  no murmurs, rubs, gallops RESPIRATORY:  Clear to auscultation without rales, wheezing or rhonchi  ABDOMEN: Soft, non-tender, non-distended MUSCULOSKELETAL:  No edema; No deformity  SKIN: Warm and dry NEUROLOGIC:  Alert and oriented x 3 PSYCHIATRIC:  Normal affect    Signed, Norman Herrlich, MD  10/21/2022 10:49 AM    [facility]

## 2022-10-21 ENCOUNTER — Encounter: Payer: Self-pay | Admitting: Cardiology

## 2022-10-21 ENCOUNTER — Ambulatory Visit: Payer: Medicare Other | Attending: Cardiology | Admitting: Cardiology

## 2022-10-21 VITALS — BP 157/83 | HR 54 | Ht 72.0 in | Wt 300.0 lb

## 2022-10-21 DIAGNOSIS — Z7901 Long term (current) use of anticoagulants: Secondary | ICD-10-CM

## 2022-10-21 DIAGNOSIS — I482 Chronic atrial fibrillation, unspecified: Secondary | ICD-10-CM

## 2022-10-21 DIAGNOSIS — I110 Hypertensive heart disease with heart failure: Secondary | ICD-10-CM

## 2022-10-21 DIAGNOSIS — I452 Bifascicular block: Secondary | ICD-10-CM | POA: Diagnosis not present

## 2022-10-21 DIAGNOSIS — I350 Nonrheumatic aortic (valve) stenosis: Secondary | ICD-10-CM

## 2022-10-21 NOTE — Patient Instructions (Signed)
Medication Instructions:  Your physician recommends that you continue on your current medications as directed. Please refer to the Current Medication list given to you today.  *If you need a refill on your cardiac medications before your next appointment, please call your pharmacy*   Lab Work: None Ordered If you have labs (blood work) drawn today and your tests are completely normal, you will receive your results only by: [place] (if you have MyChart) OR A paper copy in the mail If you have any lab test that is abnormal or we need to change your treatment, we will call you to review the results.   Testing/Procedures: None Ordered   Follow-Up: At [facility], you and your health needs are our priority.  As part of our continuing mission to provide you with exceptional heart care, we have created designated Provider Care Teams.  These Care Teams include your primary Cardiologist (physician) and Advanced Practice Providers (APPs -  Physician Assistants and Nurse Practitioners) who all work together to provide you with the care you need, when you need it.  We recommend signing up for the patient portal called "MyChart".  Sign up information is provided on this After Visit Summary.  MyChart is used to connect with patients for Virtual Visits (Telemedicine).  Patients are able to view lab/test results, encounter notes, upcoming appointments, etc.  Non-urgent messages can be sent to your provider as well.   To learn more about what you can do with MyChart, go to [email or web address].    Your next appointment:   6 month(s)  The format for your next appointment:   In Person  Provider:   Shirlee More MD    Other Instructions NA

## 2022-11-12 ENCOUNTER — Encounter: Payer: Self-pay | Admitting: Podiatry

## 2022-11-15 ENCOUNTER — Other Ambulatory Visit: Payer: Self-pay | Admitting: Podiatry

## 2022-11-15 ENCOUNTER — Ambulatory Visit: Payer: Medicare Other | Admitting: Podiatry

## 2022-11-15 ENCOUNTER — Encounter: Payer: Self-pay | Admitting: Podiatry

## 2022-11-15 DIAGNOSIS — L03031 Cellulitis of right toe: Secondary | ICD-10-CM

## 2022-11-15 DIAGNOSIS — L97512 Non-pressure chronic ulcer of other part of right foot with fat layer exposed: Secondary | ICD-10-CM

## 2022-11-15 MED ORDER — DOXYCYCLINE HYCLATE 100 MG PO TABS: 100.0000 mg | ORAL_TABLET | Freq: Two times a day (BID) | ORAL | 0 refills | Status: DC

## 2022-11-15 MED ORDER — GENTAMICIN SULFATE 0.1 % EX CREA: 1.0000 | TOPICAL_CREAM | Freq: Two times a day (BID) | CUTANEOUS | 1 refills | Status: DC

## 2022-11-15 NOTE — Addendum Note (Signed)
Addended by: Isidore Moos A on: 11/15/2022 08:30 AM   Modules accepted: Orders

## 2022-11-15 NOTE — Progress Notes (Signed)
Chief Complaint  Patient presents with   Ingrown Toenail    Right  foot 5th toe is infected. Started week ago, pain when wearing shoes    Subjective:  72 y.o. male with PMHx CHF, prior history of toe amputations presenting for new ulcer that developed to the right fifth toe.  He noticed it initially on 11/12/2022.  Idiopathic onset.  He denies a history of injuring the toe.  He believes that possibly his new diabetic shoes irritated the toe and created the wound.   Past Medical History:  Diagnosis Date   A-fib 10/08/2016   Aortic stenosis 11/06/2017   Atherosclerotic heart disease of native coronary artery with unspecified angina pectoris ([place]) 10/08/2016   Cellulitis 11/2019   right lower extremity   CHF (congestive heart failure) ([place])    Chronic anticoagulation 10/08/2016   Chronic combined systolic and diastolic heart failure ([place]) 10/08/2016   EF range 35-45% in 2016   Chronic congestive heart failure ([place])    Chronic ulcer of toe ([place]) 08/04/2019   Coagulation disorder ([place]) 04/30/2019   Foot drop- R 10/08/2016   Patient known foot drop for many years due to lumbar spinal stenosis.-Over the past couple of months symptoms have gotten worse.  He feels he is tripping more and cannot lift his foot up as much. Toes are curling and calluses on end of toes.    - Did see spinal surgeon many yrs ago.  Declined sx at that time.      Generalized OA 10/08/2016   Ortho doc-  Dr. [facility] in [place]- both knees replaced 08, 09.      H/O noncompliance with medical treatment, presenting hazards to health 10/22/2017   Heart disease    Hypertension    Hypertensive heart disease with heart failure ([place]) 11/10/2017   Longstanding persistent atrial fibrillation (HCC)    Obesity, Class III, BMI 40-49.9 (morbid obesity) ([place]) 10/08/2016   OSA (obstructive sleep apnea) 10/08/2016   Bi-PAP nitely- been 20+ yrs now   Osteoarthritis 11/19/2017   Pulmonary hypertension ([place]) 10/08/2016   RBBB (right bundle  branch block with left anterior fascicular block) 11/07/2017   Sleep apnea with use of continuous positive airway pressure (CPAP) 10/08/2016   Bi-PAP nitely- been 20+ yrs now   Spinal stenosis, lumbar region, with neurogenic claudication 10/08/2016   Back specialist in [place]-    only surgeon he would use- pt declined referral here for back pain mgt     Stroke [facility])     Past Surgical History:  Procedure Laterality Date   AMPUTATION TOE Bilateral 03/03/2022   Procedure: BILATERAL 2ND DIGIT AMPUTATION;  Surgeon: [name], DPM;  Location: [facility] ORS;  Service: Podiatry;  Laterality: Bilateral;   HAMMER TOE SURGERY Right 09/28/2021   Middle toe   I & D EXTREMITY Right 10/30/2019   Procedure: IRRIGATION AND DEBRIDEMENT EXTREMITY, right leg possible wound vac placement;  Surgeon: Marchia Bond, MD;  Location: [place];  Service: Orthopedics;  Laterality: Right;   REPLACEMENT TOTAL KNEE Bilateral    TEE WITHOUT CARDIOVERSION N/A 03/05/2022   Procedure: TRANSESOPHAGEAL ECHOCARDIOGRAM (TEE);  Surgeon: Minna Merritts, MD;  Location: [facility] ORS;  Service: Cardiovascular;  Laterality: N/A;   TIBIA FRACTURE SURGERY     VITRECTOMY PARS PLANA 23 GAUGE FOR ENDOPHTHALMITIS Left 03/04/2020   Procedure: VITRECTOMY PARS PLANA 23 GAUGE FOR ENDOPHTHALMITIS;  Surgeon: Jalene Mullet, MD;  Location: [place];  Service: Ophthalmology;  Laterality: Left;    Allergies  Allergen  Reactions   Cephalexin Rash   Dilaudid [Hydromorphone] Other (See Comments)    Hallucinations     Objective/Physical Exam General: The patient is alert and oriented x3 in no acute distress.  Dermatology:  Wound #1 noted to the right fifth digit measuring approximately 0.3 x 0.3 x 0.1 cm (LxWxD).  Wound overlies the PIPJ.  To the noted ulceration(s), there is no eschar. There is a moderate amount of slough, fibrin, and necrotic tissue noted. Granulation tissue and wound base is red. There is a minimal amount of serosanguineous drainage noted.  There is no exposed bone muscle-tendon ligament or joint. There is no malodor. Periwound integrity is intact. Skin is warm, dry and supple bilateral lower extremities.  Vascular: Diminished pedal pulses bilaterally. No edema or erythema noted.  Skin is cool to touch.  Neurological: Light touch and protective threshold diminished bilaterally.   Musculoskeletal Exam: Range of motion within normal limits to all pedal and ankle joints bilateral. Muscle strength 5/5 in all groups bilateral.   Assessment: 1.  Ulcer right fifth toe secondary to diabetes mellitus 2.  History of prior amputations bilateral   Plan of Care:  1. Patient was evaluated. 2. medically necessary excisional debridement including subcutaneous tissue was performed using a tissue nipper and a chisel blade. Excisional debridement of all the necrotic nonviable tissue down to healthy bleeding viable tissue was performed with post-debridement measurements same as pre-. 3. the wound was cleansed and dry sterile dressing applied. 4.  Prescription for doxycycline 100 mg 2 times daily #20 5.  Patient has gentamicin cream at home.  Apply daily 6.  Orders placed for ABIs bilateral.  Arterial duplex performed but no ABIs on 03/02/2022 7.  Cultures taken and sent to pathology for culture and sensitivity  8.  Return to clinic 2 weeks   Edrick Kins, DPM [facility]  Dr. Edrick Kins, DPM    [address], [place] [address]                Office [phone]  Fax [phone]

## 2022-11-19 LAB — WOUND CULTURE
MICRO NUMBER:: 14424887
RESULT:: NO GROWTH
SPECIMEN QUALITY:: ADEQUATE

## 2022-11-20 ENCOUNTER — Ambulatory Visit (HOSPITAL_COMMUNITY)
Admission: RE | Admit: 2022-11-20 | Discharge: 2022-11-20 | Disposition: A | Discharge status: Home / Self Care | Payer: Medicare Other | Source: Ambulatory Visit | Attending: Podiatry | Admitting: Podiatry

## 2022-11-20 DIAGNOSIS — L97512 Non-pressure chronic ulcer of other part of right foot with fat layer exposed: Secondary | ICD-10-CM | POA: Insufficient documentation

## 2022-11-20 LAB — VAS US ABI WITH/WO TBI
Left ABI: 1.24
Right ABI: 1.09

## 2022-11-25 ENCOUNTER — Encounter: Payer: Self-pay | Admitting: Sports Medicine

## 2022-11-26 NOTE — Telephone Encounter (Signed)
Can you figure out what I need to do for these two machines so I can take care of this for them? Thanks

## 2022-11-26 NOTE — Telephone Encounter (Signed)
This is not my patient and I have never seen him, someone in another department added me his PCP without asking me.

## 2022-11-29 ENCOUNTER — Ambulatory Visit: Payer: Medicare Other | Admitting: Podiatry

## 2022-11-29 ENCOUNTER — Encounter: Payer: Self-pay | Admitting: Podiatry

## 2022-11-29 DIAGNOSIS — L97512 Non-pressure chronic ulcer of other part of right foot with fat layer exposed: Secondary | ICD-10-CM | POA: Diagnosis not present

## 2022-11-29 NOTE — Progress Notes (Signed)
Chief Complaint  Patient presents with   Foot Ulcer    Follow up ulcer 5th toe right   "Its seems to be healing up okay to me"    Subjective:  72 y.o. male with PMHx CHF, prior history of toe amputations presenting for follow-up evaluation of an ulcer to the right fifth toe that he initially noticed on 11/12/2022 idiopathic onset.  Patient states that he is doing much better.  He completed the oral antibiotics as instructed.  No new complaints at this time.  He believes the wound has healed  Past Medical History:  Diagnosis Date   A-fib 10/08/2016   Aortic stenosis 11/06/2017   Atherosclerotic heart disease of native coronary artery with unspecified angina pectoris ([place]) 10/08/2016   Cellulitis 11/2019   right lower extremity   CHF (congestive heart failure) ([place])    Chronic anticoagulation 10/08/2016   Chronic combined systolic and diastolic heart failure ([place]) 10/08/2016   EF range 35-45% in 2016   Chronic congestive heart failure ([place])    Chronic ulcer of toe ([place]) 08/04/2019   Coagulation disorder ([place]) 04/30/2019   Foot drop- R 10/08/2016   Patient known foot drop for many years due to lumbar spinal stenosis.-Over the past couple of months symptoms have gotten worse.  He feels he is tripping more and cannot lift his foot up as much. Toes are curling and calluses on end of toes.    - Did see spinal surgeon many yrs ago.  Declined sx at that time.      Generalized OA 10/08/2016   Ortho doc-  Dr. [facility] in [place]- both knees replaced 08, 09.      H/O noncompliance with medical treatment, presenting hazards to health 10/22/2017   Heart disease    Hypertension    Hypertensive heart disease with heart failure ([place]) 11/10/2017   Longstanding persistent atrial fibrillation (HCC)    Obesity, Class III, BMI 40-49.9 (morbid obesity) ([place]) 10/08/2016   OSA (obstructive sleep apnea) 10/08/2016   Bi-PAP nitely- been 20+ yrs now   Osteoarthritis 11/19/2017   Pulmonary hypertension ([place]) 10/08/2016    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 fascicular block) 11/07/2017   Sleep apnea with use of continuous positive airway pressure (CPAP) 10/08/2016   Bi-PAP nitely- been 20+ yrs now   Spinal stenosis, lumbar region, with neurogenic claudication 10/08/2016   Back specialist in [place]-    only surgeon he would use- pt declined referral here for back pain mgt     Stroke [facility])     Past Surgical History:  Procedure Laterality Date   AMPUTATION TOE Bilateral 03/03/2022   Procedure: BILATERAL 2ND DIGIT AMPUTATION;  Surgeon: [name], DPM;  Location: [facility];  Service: Podiatry;  Laterality: Bilateral;   HAMMER TOE SURGERY Right 09/28/2021   Middle toe   I & D EXTREMITY Right 10/30/2019   Procedure: IRRIGATION AND DEBRIDEMENT EXTREMITY, right leg possible wound vac placement;  Surgeon: Marchia Bond, MD;  Location: [place];  Service: Orthopedics;  Laterality: Right;   REPLACEMENT TOTAL KNEE Bilateral    TEE WITHOUT CARDIOVERSION N/A 03/05/2022   Procedure: TRANSESOPHAGEAL ECHOCARDIOGRAM (TEE);  Surgeon: Minna Merritts, MD;  Location: [facility];  Service: Cardiovascular;  Laterality: N/A;   TIBIA FRACTURE SURGERY     VITRECTOMY PARS PLANA 23 GAUGE FOR ENDOPHTHALMITIS Left 03/04/2020   Procedure: VITRECTOMY PARS PLANA 23 GAUGE FOR ENDOPHTHALMITIS;  Surgeon: Jalene Mullet, MD;  Location: [place];  Service: Ophthalmology;  Laterality:  Left;    Allergies  Allergen Reactions   Cephalexin Rash   Dilaudid [Hydromorphone] Other (See Comments)    Hallucinations     Objective/Physical Exam General: The patient is alert and oriented x3 in no acute distress.  Dermatology:  Wound #1 noted to the right fifth digit has healed.  Complete reepithelialization has occurred.  There is some callus around the area but overall there is no open wound or granular tissue noted.  Vascular: Diminished pedal pulses bilaterally. No edema or erythema noted.  Skin is cool to touch. VAS [place] ABI W/WO TBI  11/20/2022 ABI Findings:  +---------+------------------+-----+--------+--------+  Right   Rt Pressure (mmHg)IndexWaveformComment   +---------+------------------+-----+--------+--------+  Brachial 160                                      +---------+------------------+-----+--------+--------+  PTA     174               1.09 biphasic          +---------+------------------+-----+--------+--------+  DP      111               0.69 biphasic          +---------+------------------+-----+--------+--------+  Great Toe127               0.79                   +---------+------------------+-----+--------+--------+   +---------+------------------+-----+---------+-------+  Left    Lt Pressure (mmHg)IndexWaveform Comment  +---------+------------------+-----+---------+-------+  Brachial 160                                      +---------+------------------+-----+---------+-------+  PTA     191               1.19 triphasic         +---------+------------------+-----+---------+-------+  DP      198               1.24 triphasic         +---------+------------------+-----+---------+-------+  Great Toe133               0.83                   +---------+------------------+-----+---------+-------+   +-------+-----------+-----------+------------+------------+  ABI/TBIToday's ABIToday's TBIPrevious ABIPrevious TBI  +-------+-----------+-----------+------------+------------+  Right 1.09       0.79                                 +-------+-----------+-----------+------------+------------+  Left  1.24       0.84                                 +-------+-----------+-----------+------------+------------+  Summary:  Right: Resting right ankle-brachial index is within normal range. The  right toe-brachial index is normal.   Left: Resting left ankle-brachial index is within normal range. The left  toe-brachial index is normal.    Neurological: Light touch and protective threshold diminished bilaterally.   Musculoskeletal Exam: History of prior digital amputations  Assessment: 1.  Ulcer right fifth toe; healed 2.  History of prior amputations bilateral   Plan of Care:  1. Patient was evaluated. 2.  Light debridement of the area today was performed using a tissue nipper without bleeding.  The ulcer to the fifth toe has healed 3.  Continue wearing extrawide shoes that do not irritate the fifth digit 4.  Return to clinic as needed  Edrick Kins, DPM [facility]  Dr. Edrick Kins, DPM    [address], [place] [address]                Office [phone]  Fax [phone]

## 2022-12-27 ENCOUNTER — Other Ambulatory Visit: Payer: Self-pay | Admitting: Nurse Practitioner

## 2022-12-27 DIAGNOSIS — E559 Vitamin D deficiency, unspecified: Secondary | ICD-10-CM

## 2023-01-10 ENCOUNTER — Other Ambulatory Visit: Payer: Self-pay | Admitting: Cardiology

## 2023-01-10 DIAGNOSIS — I110 Hypertensive heart disease with heart failure: Secondary | ICD-10-CM

## 2023-01-21 ENCOUNTER — Encounter: Payer: Self-pay | Admitting: Family Medicine

## 2023-01-21 ENCOUNTER — Ambulatory Visit (INDEPENDENT_AMBULATORY_CARE_PROVIDER_SITE_OTHER): Payer: Medicare Other | Admitting: Family Medicine

## 2023-01-21 VITALS — BP 133/65 | HR 75 | Ht 72.0 in | Wt 279.0 lb

## 2023-01-21 DIAGNOSIS — Z0000 Encounter for general adult medical examination without abnormal findings: Secondary | ICD-10-CM

## 2023-01-21 DIAGNOSIS — M4000 Postural kyphosis, site unspecified: Secondary | ICD-10-CM

## 2023-01-21 DIAGNOSIS — M48061 Spinal stenosis, lumbar region without neurogenic claudication: Secondary | ICD-10-CM | POA: Diagnosis not present

## 2023-01-21 DIAGNOSIS — Z1212 Encounter for screening for malignant neoplasm of rectum: Secondary | ICD-10-CM

## 2023-01-21 DIAGNOSIS — M1990 Unspecified osteoarthritis, unspecified site: Secondary | ICD-10-CM | POA: Diagnosis not present

## 2023-01-21 DIAGNOSIS — Z1211 Encounter for screening for malignant neoplasm of colon: Secondary | ICD-10-CM

## 2023-01-21 NOTE — Progress Notes (Addendum)
Virtual Visit via Phone I connected withNAME@ on 01/27/23 at  3:10 PM EDT by telephone and verified that I am speaking with the correct person using two identifiers.   I discussed the limitations, risks, security and privacy concerns of performing an evaluation and management service by telemedicine and the availability of in person appointments. The patient expressed understanding and agreed to proceed.  Patient location: Home Provider location: office  Subjective:   Joel Dominguez is a 72 y.o. male who presents for Medicare Annual/Subsequent preventive examination.  Patient would like a prescription for a back brace and/or referral to orthopedics to discuss the worsening curvature of his back from using a cane.  It has not improved even with weight loss.  Review of Systems    Review of Systems  All other systems reviewed and are negative.   Cardiac Risk Factors include: none     Objective:    Today's Vitals   01/21/23 1337  BP: 133/65  Pulse: 75  SpO2: 96%  Weight: 279 lb (126.6 kg)  Height: 6' (1.829 m)  PainSc: 7    Body mass index is 37.84 kg/m.     03/05/2022   12:28 PM 03/01/2022   12:18 PM 10/15/2021   11:37 AM 11/11/2019    5:48 PM 11/09/2019    7:59 AM 04/15/2019   10:11 AM 10/08/2016   10:28 AM  Advanced Directives  Does Patient Have a Medical Advance Directive? Yes No Yes Yes Yes No Yes  Type of Paramedic of [place];Living will  Living will;Healthcare Power of [place];Living will [place];Living will  [place];Living will  Does patient want to make changes to medical advance directive? No - Patient declined   No - Patient declined No - Patient declined    Copy of [place] in Chart? No - copy requested   No - copy requested No - copy requested    Would patient like information on creating a medical advance directive?  No - Patient declined No - Patient  declined        Current Medications (verified) Outpatient Encounter Medications as of 01/21/2023  Medication Sig   apixaban (ELIQUIS) 5 MG TABS tablet Take 1 tablet (5 mg total) by mouth 2 (two) times daily.   B Complex-C (B-COMPLEX WITH VITAMIN C) tablet Take 1 tablet by mouth daily.   bumetanide (BUMEX) 1 MG tablet TAKE 1 TABLET BY MOUTH ONCE DAILY. TAKE AN ADDITIONAL TABLET ON MONDAY, WEDNESDAY, AND FRIDAY.   carvedilol (COREG) 12.5 MG tablet TAKE 1 TABLET BY MOUTH TWICE A DAY WITH A MEAL   gentamicin cream (GARAMYCIN) 0.1 % Apply 1 Application topically 2 (two) times daily.   hydrocerin (EUCERIN) CREA Apply 1 application topically 2 (two) times daily.   Multiple Vitamin (MULTIVITAMIN) tablet Take 1 tablet by mouth daily.   spironolactone (ALDACTONE) 25 MG tablet TAKE 1 TABLET BY MOUTH DAILY   Vitamin D, Ergocalciferol, (DRISDOL) 1.25 MG (50000 UNIT) CAPS capsule TAKE 1 CAPSULE BY MOUTH EVERY 7 DAYS.   No facility-administered encounter medications on file as of 01/21/2023.    Allergies (verified) Cephalexin and Dilaudid [hydromorphone]   History: Past Medical History:  Diagnosis Date   A-fib 10/08/2016   Aortic stenosis 11/06/2017   Atherosclerotic heart disease of native coronary artery with unspecified angina pectoris ([place]) 10/08/2016   Cellulitis 11/2019   right lower extremity   CHF (congestive heart failure) (HCC)    Chronic  anticoagulation 10/08/2016   Chronic combined systolic and diastolic heart failure ([place]) 10/08/2016   EF range 35-45% in 2016   Chronic congestive heart failure ([place])    Chronic ulcer of toe ([place]) 08/04/2019   Coagulation disorder ([place]) 04/30/2019   Foot drop- R 10/08/2016   Patient known foot drop for many years due to lumbar spinal stenosis.-Over the past couple of months symptoms have gotten worse.  He feels he is tripping more and cannot lift his foot up as much. Toes are curling and calluses on end of toes.    - Did see spinal surgeon many yrs ago.   Declined sx at that time.      Generalized OA 10/08/2016   Ortho doc-  [facility]. [facility] in [place]- both knees replaced 08, 09.      H/O noncompliance with medical treatment, presenting hazards to health 10/22/2017   Heart disease    Hypertension    Hypertensive heart disease with heart failure ([place]) 11/10/2017   Longstanding persistent atrial fibrillation (HCC)    Obesity, Class III, BMI 40-49.9 (morbid obesity) ([place]) 10/08/2016   OSA (obstructive sleep apnea) 10/08/2016   Bi-PAP nitely- been 20+ yrs now   Osteoarthritis 11/19/2017   Pulmonary hypertension ([place]) 10/08/2016   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 fascicular block) 11/07/2017   Sleep apnea with use of continuous positive airway pressure (CPAP) 10/08/2016   Bi-PAP nitely- been 20+ yrs now   Spinal stenosis, lumbar region, with neurogenic claudication 10/08/2016   Back specialist in [place]-    only surgeon he would use- pt declined referral here for back pain mgt     Stroke [facility])    Past Surgical History:  Procedure Laterality Date   AMPUTATION TOE Bilateral 03/03/2022   Procedure: BILATERAL 2ND DIGIT AMPUTATION;  Surgeon: [name], DPM;  Location: [facility];  Service: Podiatry;  Laterality: Bilateral;   HAMMER TOE SURGERY Right 09/28/2021   Middle toe   I & D EXTREMITY Right 10/30/2019   Procedure: IRRIGATION AND DEBRIDEMENT EXTREMITY, right leg possible wound vac placement;  Surgeon: Marchia Bond, MD;  Location: [place];  Service: Orthopedics;  Laterality: Right;   REPLACEMENT TOTAL KNEE Bilateral    TEE WITHOUT CARDIOVERSION N/A 03/05/2022   Procedure: TRANSESOPHAGEAL ECHOCARDIOGRAM (TEE);  Surgeon: Minna Merritts, MD;  Location: [facility];  Service: Cardiovascular;  Laterality: N/A;   TIBIA FRACTURE SURGERY     VITRECTOMY PARS PLANA 23 GAUGE FOR ENDOPHTHALMITIS Left 03/04/2020   Procedure: VITRECTOMY PARS PLANA 23 GAUGE FOR ENDOPHTHALMITIS;  Surgeon: Jalene Mullet, MD;  Location: [place];  Service: Ophthalmology;   Laterality: Left;   Family History  Problem Relation Age of Onset   Congestive Heart Failure Mother    Hypertension Mother    Diabetes Mother    Cancer Father        lung   Congestive Heart Failure Sister    Hypertension Sister    Hypertension Brother    Hypertension Sister    Diabetes Brother    Social History   Socioeconomic History   Marital status: Married    Spouse name: Not on file   Number of children: Not on file   Years of education: Not on file   Highest education level: Not on file  Occupational History   Not on file  Tobacco Use   Smoking status: Never    Passive exposure: Never   Smokeless tobacco: Never  Vaping Use   Vaping Use: Never used  Substance and Sexual Activity   Alcohol use: Not Currently   Drug use: No   Sexual activity: Not on file  Other Topics Concern   Not on file  Social History Narrative   Not on file   Social Determinants of Health   Financial Resource Strain: Low Risk  (01/21/2023)   Overall Financial Resource Strain (CARDIA)    Difficulty of Paying Living Expenses: Not hard at all  Food Insecurity: No Food Insecurity (01/21/2023)   Hunger Vital Sign    Worried About Running Out of Food in the Last Year: Never true    Ran Out of Food in the Last Year: Never true  Transportation Needs: No Transportation Needs (01/21/2023)   PRAPARE - Hydrologist (Medical): No    Lack of Transportation (Non-Medical): No  Physical Activity: Inactive (01/21/2023)   Exercise Vital Sign    Days of Exercise per Week: 0 days    Minutes of Exercise per Session: 0 min  Stress: No Stress Concern Present (01/21/2023)   [place]    Feeling of Stress : Not at all  Social Connections: Moderately Integrated (01/21/2023)   Social Connection and Isolation Panel [NHANES]    Frequency of Communication with Friends and Family: More than three times a week    Frequency  of Social Gatherings with Friends and Family: Once a week    Attends Religious Services: 1 to 4 times per year    Active Member of [employer] or Organizations: No    Attends Music therapist: Never    Marital Status: Married    Tobacco Counseling Counseling given: Not Answered   Clinical Intake:  Pre-visit preparation completed: Yes  Pain : 0-10 Pain Score: 7  Pain Onset: More than a month ago Pain Frequency: Constant     Diabetes: No  How often do you need to have someone help you when you read instructions, pamphlets, or other written materials from your doctor or pharmacy?: 1 - Never  Diabetic?No  Interpreter Needed?: No  Information entered by :: Leanord Asal, RMA   Activities of Daily Living    01/21/2023    1:40 PM 01/21/2023   12:22 AM  In your present state of health, do you have any difficulty performing the following activities:  Hearing? 0 0  Vision? 1 1  Difficulty concentrating or making decisions? 0 0  Walking or climbing stairs? 1 1  Dressing or bathing? 0 0  Doing errands, shopping? 1 1  Preparing Food and eating ? N N  Using the Toilet? N N  In the past six months, have you accidently leaked urine? Y Y  Do you have problems with loss of bowel control? Y Y  Managing your Medications? N N  Managing your Finances? N N  Housekeeping or managing your Housekeeping? N N    Patient Care Team: Benay Pike, MD as PCP - General (Family Medicine) Bettina Gavia Hilton Cork, MD as PCP - Cardiology (Cardiology) Lucia Bitter., MD as Physician Assistant (Pain Medicine) Richardo Priest, MD as Consulting Physician (Cardiology) Edrick Kins, DPM as Consulting Physician (Podiatry) Tsosie Billing, MD as Consulting Physician (Infectious Diseases)  Indicate any recent Medical Services you may have received from other than [facility] providers in the past year (date may be approximate).     Assessment:   This is a routine wellness examination for  [place].  Hearing/Vision screen No results found.  Dietary issues and exercise activities discussed: Current Exercise Habits: The patient does not participate in regular exercise at present, Exercise limited by: neurologic condition(s);orthopedic condition(s)   Goals Addressed   None   Depression Screen    01/21/2023    1:27 PM 09/04/2022   10:05 AM 03/26/2022    9:19 AM 03/20/2022   10:57 AM 02/18/2022    3:09 PM 08/21/2021   10:36 AM 01/09/2021    1:18 PM  PHQ 2/9 Scores  PHQ - 2 Score 0 0 0 0 0 0 0  PHQ- 9 Score  3 0 0 3 3 0    Fall Risk    01/21/2023    1:42 PM 01/21/2023   12:22 AM 03/26/2022    9:20 AM 02/18/2022    3:09 PM 08/21/2021   10:36 AM  [place] in the past year? 0 0 0 0 0  Number falls in past yr: 0 0  0 0  Injury with Fall? 0 0  0 0  Risk for fall due to : Impaired mobility  No Fall Risks No Fall Risks No Fall Risks  Follow up   Falls evaluation completed Falls evaluation completed Falls evaluation completed    [place]:  Any stairs in or around the home? Yes  If so, are there any without handrails? No  Home free of loose throw rugs in walkways, pet beds, electrical cords, etc? Yes  Adequate lighting in your home to reduce risk of falls? Yes   ASSISTIVE DEVICES UTILIZED TO PREVENT FALLS:  Life alert? Yes  Use of a cane, walker or w/c? Yes  Grab bars in the bathroom? Yes  Shower chair or bench in shower? Yes  Elevated toilet seat or a handicapped toilet? Yes   TIMED UP AND GO:  Was the test performed?  Telephone encounter .   Cognitive Function:        01/21/2023    3:09 PM 11/21/2021   10:25 AM 10/19/2020   10:15 AM 03/09/2019    1:21 PM  6CIT Screen  What Year? 0 points 0 points 0 points 0 points  What month? 0 points 0 points 0 points 0 points  What time? 0 points 0 points 0 points 0 points  Count back from 20 0 points 0 points 0 points 0 points  Months in reverse 0 points 0 points 0 points 0  points  Repeat phrase 0 points 0 points 0 points 4 points  Total Score 0 points 0 points 0 points 4 points    Immunizations Immunization History  Administered Date(s) Administered   Fluad Quad(high Dose 65+) 08/05/2019, 09/07/2020, 08/21/2021, 09/04/2022   Influenza, High Dose Seasonal PF 10/08/2016, 09/18/2018   Influenza-Unspecified 09/18/2018   Moderna Sars-Covid-2 Vaccination 12/18/2019, 01/15/2020   Pneumococcal Conjugate-13 05/13/2014   Pneumococcal Polysaccharide-23 03/11/2007, 08/04/2010    TDAP status: Due, Education has been provided regarding the importance of this vaccine. Advised may receive this vaccine at local pharmacy or Health Dept. Aware to provide a copy of the vaccination record if obtained from local pharmacy or Health Dept. Verbalized acceptance and understanding.  Flu Vaccine status: Up to date  Pneumococcal vaccine status: Up to date  Covid-19 vaccine status: Information provided on how to obtain vaccines.   Qualifies for Shingles Vaccine? Yes   Zostavax completed No   Shingrix Completed?: No.    Education has been provided regarding the importance of this vaccine. Patient has been advised to  call insurance company to determine out of pocket expense if they have not yet received this vaccine. Advised may also receive vaccine at local pharmacy or Health Dept. Verbalized acceptance and understanding.  Screening Tests Health Maintenance  Topic Date Due   Hepatitis C Screening  Never done   DTaP/Tdap/Td (1 - Tdap) Never done   Pneumonia Vaccine 50+ Years old (3 of 3 - PPSV23 or PCV20) 05/14/2019   COVID-19 Vaccine (3 - Moderna risk series) 02/12/2020   Zoster Vaccines- Shingrix (1 of 2) 04/23/2023 (Originally 05/06/1970)   Medicare Annual Wellness (AWV)  01/21/2024   COLONOSCOPY (Pts 45-52yrs Insurance coverage will need to be confirmed)  10/23/2027   INFLUENZA VACCINE  Completed   HPV VACCINES  Aged Out    Health Maintenance  Health Maintenance Due   Topic Date Due   Hepatitis C Screening  Never done   DTaP/Tdap/Td (1 - Tdap) Never done   Pneumonia Vaccine 31+ Years old (22 of 3 - PPSV23 or PCV20) 05/14/2019   COVID-19 Vaccine (3 - Moderna risk series) 02/12/2020    Colorectal cancer screening: Type of screening: Colonoscopy. Completed 10/22/2017. Repeat every 10 years  Lung Cancer Screening: (Low Dose CT Chest recommended if Age 47-80 years, 30 pack-year currently smoking OR have quit w/in 15years.) does not qualify.   Additional Screening:  Hepatitis C Screening: does not qualify.  Vision Screening: Recommended annual ophthalmology exams for early detection of glaucoma and other disorders of the eye. Is the patient up to date with their annual eye exam?  Yes  Who is the provider or what is the name of the office in which the patient attends annual eye exams? [facility] If pt is not established with a provider, would they like to be referred to a provider to establish care?  Patient is established .   Dental Screening: Recommended annual dental exams for proper oral hygiene  Community Resource Referral / Chronic Care Management: CRR required this visit?  No   CCM required this visit?  No      Plan:    Provided referral to spine specialists.  He does have a history of lumbar spinal stenosis, but it sounds like there is progressing kyphosis.  In the meantime, sending prescription for back brace to see if it will be approved by insurance.  I have personally reviewed and noted the following in the patient's chart:   Medical and social history Use of alcohol, tobacco or illicit drugs  Current medications and supplements including opioid prescriptions. Patient is not currently taking opioid prescriptions. Functional ability and status Nutritional status Physical activity Advanced directives List of other physicians Hospitalizations, surgeries, and ER visits in previous 12 months Vitals Screenings to include  cognitive, depression, and falls Referrals and appointments  In addition, I have reviewed and discussed with patient certain preventive protocols, quality metrics, and best practice recommendations. A written personalized care plan for preventive services as well as general preventive health recommendations were provided to patient.     Velva Harman, PA   01/21/2023   Nurse Notes: Telephone encounter 20 minutes

## 2023-02-18 DIAGNOSIS — G4733 Obstructive sleep apnea (adult) (pediatric): Secondary | ICD-10-CM | POA: Diagnosis not present

## 2023-03-25 DIAGNOSIS — G8929 Other chronic pain: Secondary | ICD-10-CM | POA: Diagnosis not present

## 2023-03-25 DIAGNOSIS — M5441 Lumbago with sciatica, right side: Secondary | ICD-10-CM | POA: Diagnosis not present

## 2023-04-27 NOTE — Progress Notes (Unsigned)
Cardiology Office Note:    Date:  04/28/2023   ID:  Joel Dominguez, DOB 1951-01-11, MRN [id number]  PCP:  Sandre Kitty, MD  Cardiologist:  Norman Herrlich, MD    Referring MD: Mayer Masker, PA-C    ASSESSMENT:    1. Chronic atrial fibrillation (HCC)   2. Chronic anticoagulation   3. Hypertensive heart disease with heart failure (HCC)   4. 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 fascicular block)   5. Nonrheumatic aortic valve stenosis    PLAN:    In order of problems listed above:  Stable rate controlled continue his current beta-blocker and anticoagulant without bleeding complication Blood pressure well-controlled at home runs 110s to 120s systolic continue his current medical regimen with distal diuretic beta-blocker Stable EKG pattern he has a smart watch monitoring for bradycardia Mild aortic stenosis consider repeat echocardiogram after his next visit   Next appointment: 6 months   most recent labs 09/04/2022 hemoglobin 14.9 platelets 228,000 creatinine 0.93 potassium 4.6   Medication Adjustments/Labs and Tests Ordered: Current medicines are reviewed at length with the patient today.  Concerns regarding medicines are outlined above.  No orders of the defined types were placed in this encounter.  No orders of the defined types were placed in this encounter.    History of Present Illness:    Joel Dominguez is a 72 y.o. male with a hx of permanent atrial fibrillation mild aortic stenosis hypertensive heart disease with chronic diastolic heart failure chronic anticoagulation therapy and bifascicular heart block last seen 10/21/2022.  Last echocardiogram June 2023 showed EF in the range of 45 to 50% mildly reduced mild aortic stenosis no findings of endocarditis in the setting of bacteremia due to osteomyelitis and foot ulcer.  Compliance with diet, lifestyle and medications: Yes  Continues to do well pleased with the quality of his life he is lost 65  pounds and tends to lose another 20. His peripheral edema has resolved no shortness of breath orthopnea chest pain palpitation or syncope He tolerates his anticoagulant without bleeding  Past Medical History:  Diagnosis Date   A-fib 10/08/2016   Aortic stenosis 11/06/2017   Atherosclerotic heart disease of native coronary artery with unspecified angina pectoris (HCC) 10/08/2016   Cellulitis 11/2019   right lower extremity   CHF (congestive heart failure) (HCC)    Chronic anticoagulation 10/08/2016   Chronic combined systolic and diastolic heart failure (HCC) 10/08/2016   EF range 35-45% in 2016   Chronic congestive heart failure (HCC)    Chronic ulcer of toe (HCC) 08/04/2019   Coagulation disorder (HCC) 04/30/2019   Foot drop- R 10/08/2016   Patient known foot drop for many years due to lumbar spinal stenosis.-Over the past couple of months symptoms have gotten worse.  He feels he is tripping more and cannot lift his foot up as much. Toes are curling and calluses on end of toes.    - Did see spinal surgeon many yrs ago.  Declined sx at that time.      Generalized OA 10/08/2016   Ortho doc-  Dr. Jacqlyn Krauss in [place]- both knees replaced 08, 09.      H/O noncompliance with medical treatment, presenting hazards to health 10/22/2017   Heart disease    Hypertension    Hypertensive heart disease with heart failure (HCC) 11/10/2017   Longstanding persistent atrial fibrillation (HCC)    Obesity, Class III, BMI 40-49.9 (morbid obesity) (HCC) 10/08/2016   OSA (obstructive sleep apnea) 10/08/2016  Bi-PAP nitely- been 20+ yrs now   Osteoarthritis 11/19/2017   Pulmonary hypertension (HCC) 10/08/2016   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 fascicular block) 11/07/2017   Sleep apnea with use of continuous positive airway pressure (CPAP) 10/08/2016   Bi-PAP nitely- been 20+ yrs now   Spinal stenosis, lumbar region, with neurogenic claudication 10/08/2016   Back specialist in [place]-    only surgeon he  would use- pt declined referral here for back pain mgt     Stroke [facility])     Past Surgical History:  Procedure Laterality Date   AMPUTATION TOE Bilateral 03/03/2022   Procedure: BILATERAL 2ND DIGIT AMPUTATION;  Surgeon: Candelaria Stagers, DPM;  Location: [facility] ORS;  Service: Podiatry;  Laterality: Bilateral;   HAMMER TOE SURGERY Right 09/28/2021   Middle toe   I & D EXTREMITY Right 10/30/2019   Procedure: IRRIGATION AND DEBRIDEMENT EXTREMITY, right leg possible wound vac placement;  Surgeon: Teryl Lucy, MD;  Location: [facility] OR;  Service: Orthopedics;  Laterality: Right;   REPLACEMENT TOTAL KNEE Bilateral    TEE WITHOUT CARDIOVERSION N/A 03/05/2022   Procedure: TRANSESOPHAGEAL ECHOCARDIOGRAM (TEE);  Surgeon: Antonieta Iba, MD;  Location: [facility] ORS;  Service: Cardiovascular;  Laterality: N/A;   TIBIA FRACTURE SURGERY     VITRECTOMY PARS PLANA 23 GAUGE FOR ENDOPHTHALMITIS Left 03/04/2020   Procedure: VITRECTOMY PARS PLANA 23 GAUGE FOR ENDOPHTHALMITIS;  Surgeon: Carmela Rima, MD;  Location: [facility] OR;  Service: Ophthalmology;  Laterality: Left;    Current Medications: Current Meds  Medication Sig   apixaban (ELIQUIS) 5 MG TABS tablet Take 1 tablet (5 mg total) by mouth 2 (two) times daily.   B Complex-C (B-COMPLEX WITH VITAMIN C) tablet Take 1 tablet by mouth daily.   bumetanide (BUMEX) 1 MG tablet TAKE 1 TABLET BY MOUTH ONCE DAILY. TAKE AN ADDITIONAL TABLET ON MONDAY, WEDNESDAY, AND FRIDAY.   carvedilol (COREG) 12.5 MG tablet TAKE 1 TABLET BY MOUTH TWICE A DAY WITH A MEAL   gentamicin cream (GARAMYCIN) 0.1 % Apply 1 Application topically 2 (two) times daily.   hydrocerin (EUCERIN) CREA Apply 1 application topically 2 (two) times daily.   HYDROcodone-acetaminophen (NORCO) 10-325 MG tablet Take 1 tablet by mouth every 8 (eight) hours as needed for moderate pain.   Multiple Vitamin (MULTIVITAMIN) tablet Take 1 tablet by mouth daily.   spironolactone (ALDACTONE) 25 MG tablet TAKE 1 TABLET BY MOUTH  DAILY   Vitamin D, Ergocalciferol, (DRISDOL) 1.25 MG (50000 UNIT) CAPS capsule TAKE 1 CAPSULE BY MOUTH EVERY 7 DAYS.     Allergies:   Cephalexin and Dilaudid [hydromorphone]   Social History   Socioeconomic History   Marital status: Married    Spouse name: Not on file   Number of children: Not on file   Years of education: Not on file   Highest education level: Not on file  Occupational History   Not on file  Tobacco Use   Smoking status: Never    Passive exposure: Never   Smokeless tobacco: Never  Vaping Use   Vaping Use: Never used  Substance and Sexual Activity   Alcohol use: Not Currently   Drug use: No   Sexual activity: Not on file  Other Topics Concern   Not on file  Social History Narrative   Not on file   Social Determinants of Health   Financial Resource Strain: Low Risk  (01/21/2023)   Overall Financial Resource Strain (CARDIA)    Difficulty of Paying Living Expenses: Not  hard at all  Food Insecurity: No Food Insecurity (01/21/2023)   Hunger Vital Sign    Worried About Running Out of Food in the Last Year: Never true    Ran Out of Food in the Last Year: Never true  Transportation Needs: No Transportation Needs (01/21/2023)   PRAPARE - Administrator, Civil Service (Medical): No    Lack of Transportation (Non-Medical): No  Physical Activity: Inactive (01/21/2023)   Exercise Vital Sign    Days of Exercise per Week: 0 days    Minutes of Exercise per Session: 0 min  Stress: No Stress Concern Present (01/21/2023)   [employer] of Occupational Health - Occupational Stress Questionnaire    Feeling of Stress : Not at all  Social Connections: Moderately Integrated (01/21/2023)   Social Connection and Isolation Panel [NHANES]    Frequency of Communication with Friends and Family: More than three times a week    Frequency of Social Gatherings with Friends and Family: Once a week    Attends Religious Services: 1 to 4 times per year    Active Member of  [employer] or Organizations: No    Attends Banker Meetings: Never    Marital Status: Married     Doctor, hospital Studies Reviewed:    The following studies were reviewed today:  Cardiac Studies & Procedures       ECHOCARDIOGRAM  ECHOCARDIOGRAM COMPLETE 03/03/2022  Narrative ECHOCARDIOGRAM REPORT    Patient Name:   RASHIED CORALLO Date of Exam: 03/03/2022 Medical Rec #:  [id number]      Height:       72.0 in Accession #:    [id number]     Weight:       310.0 lb Date of Birth:  12/19/1950       BSA:          2.567 m Patient Age:    72 years       BP:           150/77 mmHg Patient Gender: M              HR:           103 bpm. Exam Location:  [facility]  Procedure: 2D Echo and Intracardiac Opacification Agent  Indications:     Bacteremia  History:         Patient has prior history of Echocardiogram examinations. Aortic Valve Disease, Arrythmias:Atrial Fibrillation; Risk Factors:Hypertension and Morbid Obesity.  Sonographer:     L Thornton-Maynard Referring Phys:  [id number] Diagnosing Phys: Debbe Odea MD   Sonographer Comments: Technically difficult study due to poor echo windows and patient is morbidly obese. Image acquisition challenging due to patient body habitus. IMPRESSIONS   1. Left ventricular ejection fraction, by estimation, is 50 to 55%. Left ventricular ejection fraction by 2D MOD biplane is 54.9 %. The left ventricle has low normal function. The left ventricle has no regional wall motion abnormalities. Left ventricular diastolic parameters are indeterminate. 2. Right ventricular systolic function is normal. The right ventricular size is normal. There is moderately elevated pulmonary artery systolic pressure. The estimated right ventricular systolic pressure is 53.2 mmHg. 3. The mitral valve is degenerative. Mild mitral valve regurgitation. 4. The aortic valve was not well visualized. Aortic valve regurgitation is mild. Aortic valve  sclerosis/calcification is present, without any evidence of aortic stenosis. Aortic valve mean gradient measures 7.0 mmHg. 5. Aortic dilatation noted. There is mild dilatation of the aortic root and of  the ascending aorta, measuring 40 mm. 6. The inferior vena cava is dilated in size with <50% respiratory variability, suggesting right atrial pressure of 15 mmHg.  Conclusion(s)/Recommendation(s): No evidence of valvular vegetations on this transthoracic echocardiogram. Consider a transesophageal echocardiogram to exclude infective endocarditis if clinically indicated.  FINDINGS Left Ventricle: Left ventricular ejection fraction, by estimation, is 50 to 55%. Left ventricular ejection fraction by 2D MOD biplane is 54.9 %. The left ventricle has low normal function. The left ventricle has no regional wall motion abnormalities. Definity contrast agent was given IV to delineate the left ventricular endocardial borders. The left ventricular internal cavity size was normal in size. There is no left ventricular hypertrophy. Left ventricular diastolic parameters are indeterminate.  Right Ventricle: The right ventricular size is normal. No increase in right ventricular wall thickness. Right ventricular systolic function is normal. There is moderately elevated pulmonary artery systolic pressure. The tricuspid regurgitant velocity is 3.09 m/s, and with an assumed right atrial pressure of 15 mmHg, the estimated right ventricular systolic pressure is 53.2 mmHg.  Left Atrium: Left atrial size was normal in size.  Right Atrium: Right atrial size was normal in size.  Pericardium: There is no evidence of pericardial effusion.  Mitral Valve: The mitral valve is degenerative in appearance. Mild mitral annular calcification. Mild mitral valve regurgitation. MV peak gradient, 4.2 mmHg. The mean mitral valve gradient is 2.0 mmHg.  Tricuspid Valve: The tricuspid valve is normal in structure. Tricuspid valve  regurgitation is mild.  Aortic Valve: The aortic valve was not well visualized. Aortic valve regurgitation is mild. Aortic regurgitation PHT measures 453 msec. Aortic valve sclerosis/calcification is present, without any evidence of aortic stenosis. Aortic valve mean gradient measures 7.0 mmHg. Aortic valve peak gradient measures 13.4 mmHg. Aortic valve area, by VTI measures 2.27 cm.  Pulmonic Valve: The pulmonic valve was not well visualized. Pulmonic valve regurgitation is not visualized.  Aorta: Aortic dilatation noted. There is mild dilatation of the aortic root and of the ascending aorta, measuring 40 mm.  Venous: The inferior vena cava is dilated in size with less than 50% respiratory variability, suggesting right atrial pressure of 15 mmHg.  IAS/Shunts: No atrial level shunt detected by color flow Doppler.   LEFT VENTRICLE PLAX 2D                        Biplane EF (MOD) LVOT diam:     2.50 cm         LV Biplane EF:   Left LV SV:         67                               ventricular LV SV Index:   26                               ejection LVOT Area:     4.91 cm                         fraction by 2D MOD biplane is LV Volumes (MOD)                                54.9 %. LV vol d, MOD    118.9 ml A2C:  Diastology LV vol d, MOD    125.5 ml      LV e' medial:    3.98 cm/s A4C:                           LV E/e' medial:  24.7 LV vol s, MOD    55.9 ml       LV e' lateral:   6.75 cm/s A2C:                           LV E/e' lateral: 14.6 LV vol s, MOD    57.6 ml A4C: LV SV MOD A2C:   63.0 ml LV SV MOD A4C:   125.5 ml LV SV MOD BP:    67.7 ml  RIGHT VENTRICLE RV S prime:     7.05 cm/s TAPSE (M-mode): 1.2 cm  LEFT ATRIUM             Index        RIGHT ATRIUM           Index LA diam:        4.80 cm 1.87 cm/m   RA Area:     27.20 cm LA Vol (A2C):   84.5 ml 32.92 ml/m  RA Volume:   83.40 ml  32.49 ml/m LA Vol (A4C):   69.7 ml 27.15 ml/m LA Biplane  Vol: 76.9 ml 29.96 ml/m AORTIC VALVE                     PULMONIC VALVE AV Area (Vmax):    1.87 cm      PV Vmax:       1.02 m/s AV Area (Vmean):   2.12 cm      PV Peak grad:  4.2 mmHg AV Area (VTI):     2.27 cm AV Vmax:           183.00 cm/s AV Vmean:          125.000 cm/s AV VTI:            0.294 m AV Peak Grad:      13.4 mmHg AV Mean Grad:      7.0 mmHg LVOT Vmax:         69.80 cm/s LVOT Vmean:        54.000 cm/s LVOT VTI:          0.136 m LVOT/AV VTI ratio: 0.46 AI PHT:            453 msec  AORTA Ao Root diam: 4.00 cm Ao Asc diam:  4.00 cm  MITRAL VALVE               TRICUSPID VALVE MV Area (PHT): 6.37 cm    TR Peak grad:   38.2 mmHg MV Area VTI:   2.94 cm    TR Vmax:        309.00 cm/s MV Peak grad:  4.2 mmHg MV Mean grad:  2.0 mmHg    SHUNTS MV Vmax:       1.03 m/s    Systemic VTI:  0.14 m MV Vmean:      70.5 cm/s   Systemic Diam: 2.50 cm MV Decel Time: 119 msec MV E velocity: 98.50 cm/s MV A velocity: 43.70 cm/s MV E/A ratio:  2.25  Debbe Odea MD Electronically signed by Debbe Odea MD Signature Date/Time: 03/03/2022/12:21:47 PM    Final  TEE  ECHO TEE 03/05/2022  Narrative TRANSESOPHOGEAL ECHO REPORT    Patient Name:   ARVIE BARTHOLOMEW Date of Exam: 03/05/2022 Medical Rec #:  [id number]      Height:       72.0 in Accession #:    [id number]     Weight:       310.0 lb Date of Birth:  [birth year]       BSA:          2.567 m Patient Age:    72 years       BP:           120/79 mmHg Patient Gender: M              HR:           86 bpm. Exam Location:  [facility]  Procedure: Transesophageal Echo, Color Doppler, Cardiac Doppler and Saline Contrast Bubble Study  Indications:     Not listed on TEE check-in sheet  History:         Patient has prior history of Echocardiogram examinations, most recent 03/03/2022. CHF, Arrythmias:Atrial Fibrillation; Risk Factors:Hypertension.  Sonographer:     Cristela Blue Referring Phys:  [id number] Debbe Odea Diagnosing Phys: Julien Nordmann MD  PROCEDURE: After discussion of the risks and benefits of a TEE, an informed consent was obtained from the patient. TEE procedure time was 25 minutes. The transesophogeal probe was passed without difficulty through the esophogus of the patient. Imaged were obtained with the patient in a left lateral decubitus position. Local oropharyngeal anesthetic was provided with Benzocaine spray and viscous lidocaine. Sedation performed by performing physician. Patients was under conscious sedation during this procedure. Anesthetic administered: of Fentanyl, 4.0mg  of Versed. Image quality was excellent. The patient's vital signs; including heart rate, blood pressure, and oxygen saturation; remained stable throughout the procedure. The patient developed no complications during the procedure.  IMPRESSIONS   1. No valve vegetation noted. 2. Left ventricular ejection fraction, by estimation, is 55 to 60%. The left ventricle has normal function. The left ventricle has no regional wall motion abnormalities. 3. Right ventricular systolic function is normal. The right ventricular size is mildly enlarged. There is moderately elevated pulmonary artery systolic pressure. The estimated right ventricular systolic pressure is 54.0 mmHg. 4. Left atrial size was moderately dilated. No left atrial/left atrial appendage thrombus was detected. 5. The mitral valve is normal in structure. Mild to moderate mitral valve regurgitation. No evidence of mitral stenosis. 6. Tricuspid valve regurgitation is moderate. 7. The aortic valve is normal in structure. There is mild calcification of the aortic valve. Aortic valve regurgitation is not visualized. Aortic valve sclerosis/calcification is present, without any evidence of aortic stenosis. 8. The inferior vena cava is normal in size with greater than 50% respiratory variability, suggesting right atrial pressure of 3 mmHg. 9.  Agitated saline contrast bubble study was negative, with no evidence of any interatrial shunt. 10. There is mild (Grade II) atheroma plaque involving the aortic arch.  Conclusion(s)/Recommendation(s): Normal biventricular function without evidence of hemodynamically significant valvular heart disease.  FINDINGS Left Ventricle: Left ventricular ejection fraction, by estimation, is 55 to 60%. The left ventricle has normal function. The left ventricle has no regional wall motion abnormalities. The left ventricular internal cavity size was normal in size. There is no left ventricular hypertrophy.  Right Ventricle: The right ventricular size is mildly enlarged. No increase in right ventricular wall thickness. Right ventricular systolic function is normal. There is moderately elevated pulmonary  artery systolic pressure. The tricuspid regurgitant velocity is 3.50 m/s, and with an assumed right atrial pressure of 5 mmHg, the estimated right ventricular systolic pressure is 54.0 mmHg.  Left Atrium: Left atrial size was moderately dilated. No left atrial/left atrial appendage thrombus was detected.  Right Atrium: Right atrial size was normal in size.  Pericardium: There is no evidence of pericardial effusion.  Mitral Valve: The mitral valve is normal in structure. Mild to moderate mitral valve regurgitation. No evidence of mitral valve stenosis.  Tricuspid Valve: The tricuspid valve is normal in structure. Tricuspid valve regurgitation is moderate . No evidence of tricuspid stenosis.  Aortic Valve: The aortic valve is normal in structure. There is mild calcification of the aortic valve. Aortic valve regurgitation is not visualized. Aortic valve sclerosis/calcification is present, without any evidence of aortic stenosis.  Pulmonic Valve: The pulmonic valve was normal in structure. Pulmonic valve regurgitation is not visualized. No evidence of pulmonic stenosis.  Aorta: The aortic root is normal in  size and structure. There is mild (Grade II) atheroma plaque involving the aortic arch.  Venous: The inferior vena cava is normal in size with greater than 50% respiratory variability, suggesting right atrial pressure of 3 mmHg.  IAS/Shunts: No atrial level shunt detected by color flow Doppler. Agitated saline contrast was given intravenously to evaluate for intracardiac shunting. Agitated saline contrast bubble study was negative, with no evidence of any interatrial shunt.   TRICUSPID VALVE TR Peak grad:   49.0 mmHg TR Vmax:        350.00 cm/s  Julien Nordmann MD Electronically signed by Julien Nordmann MD Signature Date/Time: 03/05/2022/5:51:00 PM    Final            EKG:  EKG ordered today and personally reviewed.  The ekg ordered today demonstrates atrial fibrillation controlled ventricular rate stable pattern bifascicular heart block.  Recent Labs: 09/04/2022: ALT 13; BUN 25; Creatinine, Ser 0.93; Hemoglobin 14.9; Platelets 228; Potassium 4.6; Sodium 139; TSH 1.330  Recent Lipid Panel    Component Value Date/Time   CHOL 156 09/04/2022 1043   TRIG 146 09/04/2022 1043   HDL 41 09/04/2022 1043   CHOLHDL 3.8 09/04/2022 1043   CHOLHDL 4.3 10/08/2016 1144   VLDL 45 (H) 10/08/2016 1144   LDLCALC 89 09/04/2022 1043    Physical Exam:    VS:  BP 118/70 (BP Location: Right Arm, Patient Position: Sitting, Cuff Size: Large)   Pulse 83   Ht 6' (1.829 m)   Wt 260 lb (117.9 kg)   SpO2 94%   BMI 35.26 kg/m     Wt Readings from Last 3 Encounters:  04/28/23 260 lb (117.9 kg)  01/21/23 279 lb (126.6 kg)  10/21/22 300 lb (136.1 kg)     GEN:  Well nourished, well developed in no acute distress HEENT: Normal NECK: No JVD; No carotid bruits LYMPHATICS: No lymphadenopathy CARDIAC: Irregular rate and rhythm 1/6 ejection murmur aortic area  RESPIRATORY:  Clear to auscultation without rales, wheezing or rhonchi  ABDOMEN: Soft, non-tender, non-distended MUSCULOSKELETAL:  No edema; No  deformity  SKIN: Warm and dry NEUROLOGIC:  Alert and oriented x 3 PSYCHIATRIC:  Normal affect    Signed, Norman Herrlich, MD  04/28/2023 10:26 AM    [facility]

## 2023-04-28 ENCOUNTER — Encounter: Payer: Self-pay | Admitting: Cardiology

## 2023-04-28 ENCOUNTER — Ambulatory Visit: Payer: Medicare Other | Attending: Cardiology | Admitting: Cardiology

## 2023-04-28 VITALS — BP 118/70 | HR 83 | Ht 72.0 in | Wt 260.0 lb

## 2023-04-28 DIAGNOSIS — Z7901 Long term (current) use of anticoagulants: Secondary | ICD-10-CM | POA: Diagnosis not present

## 2023-04-28 DIAGNOSIS — I482 Chronic atrial fibrillation, unspecified: Secondary | ICD-10-CM | POA: Diagnosis not present

## 2023-04-28 DIAGNOSIS — I452 Bifascicular block: Secondary | ICD-10-CM

## 2023-04-28 DIAGNOSIS — I350 Nonrheumatic aortic (valve) stenosis: Secondary | ICD-10-CM | POA: Diagnosis not present

## 2023-04-28 DIAGNOSIS — I110 Hypertensive heart disease with heart failure: Secondary | ICD-10-CM | POA: Diagnosis not present

## 2023-04-28 NOTE — Patient Instructions (Addendum)
Medication Instructions:  Your physician recommends that you continue on your current medications as directed. Please refer to the Current Medication list given to you today.  *If you need a refill on your cardiac medications before your next appointment, please call your pharmacy*   Lab Work: Your physician recommends that you return for lab work in:   Labs today: CMP  If you have labs (blood work) drawn today and your tests are completely normal, you will receive your results only by: MyChart Message (if you have MyChart) OR A paper copy in the mail If you have any lab test that is abnormal or we need to change your treatment, we will call you to review the results.   Testing/Procedures: None   Follow-Up: At [facility], you and your health needs are our priority.  As part of our continuing mission to provide you with exceptional heart care, we have created designated Provider Care Teams.  These Care Teams include your primary Cardiologist (physician) and Advanced Practice Providers (APPs -  Physician Assistants and Nurse Practitioners) who all work together to provide you with the care you need, when you need it.  We recommend signing up for the patient portal called "MyChart".  Sign up information is provided on this After Visit Summary.  MyChart is used to connect with patients for Virtual Visits (Telemedicine).  Patients are able to view lab/test results, encounter notes, upcoming appointments, etc.  Non-urgent messages can be sent to your provider as well.   To learn more about what you can do with MyChart, go to [email or web address].    Your next appointment:   6 month(s)  Provider:   Norman Herrlich, MD    Other Instructions None

## 2023-04-28 NOTE — Addendum Note (Signed)
Addended by: Roxanne Mins I on: 04/28/2023 10:38 AM   Modules accepted: Orders

## 2023-04-29 LAB — COMPREHENSIVE METABOLIC PANEL
ALT: 10 IU/L (ref 0–44)
AST: 15 IU/L (ref 0–40)
Albumin: 4.1 g/dL (ref 3.8–4.8)
Alkaline Phosphatase: 79 IU/L (ref 44–121)
BUN/Creatinine Ratio: 26 — ABNORMAL HIGH (ref 10–24)
BUN: 32 mg/dL — ABNORMAL HIGH (ref 8–27)
Bilirubin Total: 0.9 mg/dL (ref 0.0–1.2)
CO2: 27 mmol/L (ref 20–29)
Calcium: 9.7 mg/dL (ref 8.6–10.2)
Chloride: 100 mmol/L (ref 96–106)
Creatinine, Ser: 1.21 mg/dL (ref 0.76–1.27)
Globulin, Total: 3 g/dL (ref 1.5–4.5)
Glucose: 105 mg/dL — ABNORMAL HIGH (ref 70–99)
Potassium: 5 mmol/L (ref 3.5–5.2)
Sodium: 138 mmol/L (ref 134–144)
Total Protein: 7.1 g/dL (ref 6.0–8.5)
eGFR: 64 mL/min/{1.73_m2} (ref >59)

## 2023-05-25 ENCOUNTER — Other Ambulatory Visit: Payer: Self-pay | Admitting: Cardiology

## 2023-05-25 DIAGNOSIS — I482 Chronic atrial fibrillation, unspecified: Secondary | ICD-10-CM

## 2023-05-26 ENCOUNTER — Telehealth: Payer: Self-pay | Admitting: Internal Medicine

## 2023-05-26 NOTE — Telephone Encounter (Signed)
Prescription refill request for Eliquis received. Indication:AFIB Last office visit:6/24 Scr:1.21  6/24 Age:72  Weight:117.9  kg  Prescription refilled

## 2023-05-26 NOTE — Telephone Encounter (Signed)
Good afternoon Dr. Leonides Schanz  The following patient is being referred to [place] for a colonoscopy. His last procedure was 5 years ago in [place] and now that he has moved to [place] he is looking to establish care here. Records are available on Epic. Please review and advise of scheduling. Thank you

## 2023-06-09 ENCOUNTER — Encounter: Payer: Self-pay | Admitting: Family Medicine

## 2023-06-19 NOTE — Telephone Encounter (Signed)
Can we resend the referral to [facility], per the pt's request?

## 2023-06-26 DIAGNOSIS — M25562 Pain in left knee: Secondary | ICD-10-CM | POA: Diagnosis not present

## 2023-06-26 DIAGNOSIS — M25561 Pain in right knee: Secondary | ICD-10-CM | POA: Diagnosis not present

## 2023-06-26 DIAGNOSIS — Z79899 Other long term (current) drug therapy: Secondary | ICD-10-CM | POA: Diagnosis not present

## 2023-06-26 DIAGNOSIS — G8929 Other chronic pain: Secondary | ICD-10-CM | POA: Diagnosis not present

## 2023-06-26 DIAGNOSIS — Z5181 Encounter for therapeutic drug level monitoring: Secondary | ICD-10-CM | POA: Diagnosis not present

## 2023-06-26 DIAGNOSIS — M5441 Lumbago with sciatica, right side: Secondary | ICD-10-CM | POA: Diagnosis not present

## 2023-07-16 ENCOUNTER — Other Ambulatory Visit: Payer: Self-pay | Admitting: Cardiology

## 2023-07-16 DIAGNOSIS — I110 Hypertensive heart disease with heart failure: Secondary | ICD-10-CM

## 2023-07-23 DIAGNOSIS — H35361 Drusen (degenerative) of macula, right eye: Secondary | ICD-10-CM | POA: Diagnosis not present

## 2023-07-23 DIAGNOSIS — H35031 Hypertensive retinopathy, right eye: Secondary | ICD-10-CM | POA: Diagnosis not present

## 2023-07-23 DIAGNOSIS — H25811 Combined forms of age-related cataract, right eye: Secondary | ICD-10-CM | POA: Diagnosis not present

## 2023-07-28 ENCOUNTER — Encounter: Payer: Self-pay | Admitting: Podiatry

## 2023-08-05 ENCOUNTER — Ambulatory Visit: Payer: Medicare Other | Admitting: Podiatry

## 2023-08-05 ENCOUNTER — Ambulatory Visit (INDEPENDENT_AMBULATORY_CARE_PROVIDER_SITE_OTHER): Payer: Medicare Other

## 2023-08-05 ENCOUNTER — Encounter: Payer: Self-pay | Admitting: Podiatry

## 2023-08-05 VITALS — BP 118/71 | HR 78

## 2023-08-05 DIAGNOSIS — L97512 Non-pressure chronic ulcer of other part of right foot with fat layer exposed: Secondary | ICD-10-CM | POA: Diagnosis not present

## 2023-08-05 DIAGNOSIS — M2041 Other hammer toe(s) (acquired), right foot: Secondary | ICD-10-CM | POA: Diagnosis not present

## 2023-08-05 DIAGNOSIS — L97511 Non-pressure chronic ulcer of other part of right foot limited to breakdown of skin: Secondary | ICD-10-CM | POA: Diagnosis not present

## 2023-08-05 MED ORDER — DOXYCYCLINE HYCLATE 100 MG PO TABS: 100.0000 mg | ORAL_TABLET | Freq: Two times a day (BID) | ORAL | 0 refills | Status: DC

## 2023-08-05 NOTE — Telephone Encounter (Signed)
Patient seen today.  Thanks, Dr. Logan Bores

## 2023-08-05 NOTE — Progress Notes (Signed)
Chief Complaint  Patient presents with   Toe Pain    "He has a sore on his toe." N - sore L - 4th digit distal right D - 1.5 weeks O - suddenly, about the same C - drainage, scab, sore A - shoes, walking T - prescribed ointment    Subjective:  72 y.o. male with PMHx history of prior amputations and ulcers presenting for new onset of ulcer development to the right fourth toe. Onset about 1.5 weeks ago. Idiopathic onset. Patient has a history of neuropathy. Has not done anything for treatment.   Past Medical History:  Diagnosis Date   A-fib 10/08/2016   Aortic stenosis 11/06/2017   Atherosclerotic heart disease of native coronary artery with unspecified angina pectoris (HCC) 10/08/2016   Cellulitis 11/2019   right lower extremity   CHF (congestive heart failure) (HCC)    Chronic anticoagulation 10/08/2016   Chronic combined systolic and diastolic heart failure (HCC) 10/08/2016   EF range 35-45% in 2016   Chronic congestive heart failure (HCC)    Chronic ulcer of toe (HCC) 08/04/2019   Coagulation disorder (HCC) 04/30/2019   Foot drop- R 10/08/2016   Patient known foot drop for many years due to lumbar spinal stenosis.-Over the past couple of months symptoms have gotten worse.  He feels he is tripping more and cannot lift his foot up as much. Toes are curling and calluses on end of toes.    - Did see spinal surgeon many yrs ago.  Declined sx at that time.      Generalized OA 10/08/2016   Ortho doc-  Dr. Jacqlyn Krauss in [place]- both knees replaced 08, 09.      H/O noncompliance with medical treatment, presenting hazards to health 10/22/2017   Heart disease    Hypertension    Hypertensive heart disease with heart failure (HCC) 11/10/2017   Longstanding persistent atrial fibrillation (HCC)    Obesity, Class III, BMI 40-49.9 (morbid obesity) (HCC) 10/08/2016   OSA (obstructive sleep apnea) 10/08/2016   Bi-PAP nitely- been 20+ yrs now   Osteoarthritis 11/19/2017   Pulmonary hypertension (HCC)  10/08/2016   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 fascicular block) 11/07/2017   Sleep apnea with use of continuous positive airway pressure (CPAP) 10/08/2016   Bi-PAP nitely- been 20+ yrs now   Spinal stenosis, lumbar region, with neurogenic claudication 10/08/2016   Back specialist in [place]-    only surgeon he would use- pt declined referral here for back pain mgt     Stroke [facility])     Past Surgical History:  Procedure Laterality Date   AMPUTATION TOE Bilateral 03/03/2022   Procedure: BILATERAL 2ND DIGIT AMPUTATION;  Surgeon: Candelaria Stagers, DPM;  Location: [facility] ORS;  Service: Podiatry;  Laterality: Bilateral;   HAMMER TOE SURGERY Right 09/28/2021   Middle toe   I & D EXTREMITY Right 10/30/2019   Procedure: IRRIGATION AND DEBRIDEMENT EXTREMITY, right leg possible wound vac placement;  Surgeon: Teryl Lucy, MD;  Location: [facility] OR;  Service: Orthopedics;  Laterality: Right;   REPLACEMENT TOTAL KNEE Bilateral    TEE WITHOUT CARDIOVERSION N/A 03/05/2022   Procedure: TRANSESOPHAGEAL ECHOCARDIOGRAM (TEE);  Surgeon: Antonieta Iba, MD;  Location: [facility] ORS;  Service: Cardiovascular;  Laterality: N/A;   TIBIA FRACTURE SURGERY     VITRECTOMY PARS PLANA 23 GAUGE FOR ENDOPHTHALMITIS Left 03/04/2020   Procedure: VITRECTOMY PARS PLANA 23 GAUGE FOR ENDOPHTHALMITIS;  Surgeon: Carmela Rima, MD;  Location: [facility] OR;  Service:  Ophthalmology;  Laterality: Left;    Allergies  Allergen Reactions   Cephalexin Rash   Dilaudid [Hydromorphone] Other (See Comments)    Hallucinations     Objective/Physical Exam General: The patient is alert and oriented x3 in no acute distress.  Dermatology:  Wound #1 noted to the distal tip of the right fourth toe measuring approximately 0.3x0.3x0.1 cm (LxWxD).   To the noted ulceration(s), there is no eschar. There is a moderate amount of slough, fibrin, and necrotic tissue noted. Granulation tissue and wound base is red. There is a minimal amount of  serosanguineous drainage noted. There is no exposed bone muscle-tendon ligament or joint. There is no malodor. Periwound integrity is intact. Skin is warm, dry and supple bilateral lower extremities.  Vascular: Palpable pedal pulses bilaterally. No edema or erythema noted. Capillary refill within normal limits.  Neurological: Light touch and protective threshold diminished bilaterally.   Musculoskeletal Exam: H/o prior amputation right second toe amputation.  Hammertoe contracture deformity also noted to the remaining digits of the foot.  This is contributory to the ulcer to the distal tip of the toe with  Radiographic exam RT foot 08/05/2023: No osseous erosions noted that would be concerning for osteomyelitis to the right fourth toe.  Absence of the second toe noted at the level of the MTP.  Hammertoe contracture deformity noted to the lesser digits  Assessment: 1.  Ulcer distal tip of the right fourth toe 2.  Hammertoe contracture lesser digits   Plan of Care:  -Patient was evaluated. -Medically necessary excisional debridement including subcutaneous tissue was performed using a tissue nipper and a chisel blade. Excisional debridement of all the necrotic nonviable tissue down to healthy bleeding viable tissue was performed with post-debridement measurements same as pre-. -Prescription for doxycycline 100 mg 2 times daily #20 -Patient has prescription gentamicin cream at home.  Apply twice daily -Offloading silicone toe crest pads were provided for the patient to wear and offload pressure from the distal tip of the toe -Return to clinic 3 weeks.  We did discuss today the possibility of flexor tenotomy to alleviate pressure from the distal tip of the toe.  For now we are going to pursue conservative treatment  Felecia Shelling, DPM [facility]  Dr. Felecia Shelling, DPM    [address] [place], [place] [address]                Office [phone]  Fax [phone]

## 2023-08-12 ENCOUNTER — Ambulatory Visit: Payer: Medicare Other | Admitting: Podiatry

## 2023-08-19 ENCOUNTER — Encounter: Payer: Self-pay | Admitting: Podiatry

## 2023-08-19 ENCOUNTER — Ambulatory Visit: Payer: Medicare Other | Admitting: Podiatry

## 2023-08-19 VITALS — BP 131/64

## 2023-08-19 DIAGNOSIS — L97512 Non-pressure chronic ulcer of other part of right foot with fat layer exposed: Secondary | ICD-10-CM | POA: Diagnosis not present

## 2023-08-19 NOTE — Progress Notes (Signed)
Chief Complaint  Patient presents with   Wound Check    "It's better."    Subjective:  72 y.o. male with PMHx history of prior amputations and ulcers presenting for follow-up evaluation of an ulcer to the right fourth toe.  Overall he says there is some improvement.  He is taking the doxycycline and applying the gentamicin cream as instructed.  No new complaints  Past Medical History:  Diagnosis Date   A-fib 10/08/2016   Aortic stenosis 11/06/2017   Atherosclerotic heart disease of native coronary artery with unspecified angina pectoris (HCC) 10/08/2016   Cellulitis 11/2019   right lower extremity   CHF (congestive heart failure) (HCC)    Chronic anticoagulation 10/08/2016   Chronic combined systolic and diastolic heart failure (HCC) 10/08/2016   EF range 35-45% in 2016   Chronic congestive heart failure (HCC)    Chronic ulcer of toe (HCC) 08/04/2019   Coagulation disorder (HCC) 04/30/2019   Foot drop- R 10/08/2016   Patient known foot drop for many years due to lumbar spinal stenosis.-Over the past couple of months symptoms have gotten worse.  He feels he is tripping more and cannot lift his foot up as much. Toes are curling and calluses on end of toes.    - Did see spinal surgeon many yrs ago.  Declined sx at that time.      Generalized OA 10/08/2016   Ortho doc-  Dr. Jacqlyn Krauss in [place]- both knees replaced 08, 09.      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 fascicular block) 11/07/2017   Sleep apnea with use of continuous positive  airway pressure (CPAP) 10/08/2016   Bi-PAP nitely- been 20+ yrs now   Spinal stenosis, lumbar region, with neurogenic claudication 10/08/2016   Back specialist in [place]-    only surgeon he would use- pt declined referral here for back pain mgt     Stroke [facility])     Past Surgical History:  Procedure Laterality Date   AMPUTATION TOE Bilateral 03/03/2022   Procedure: BILATERAL 2ND DIGIT AMPUTATION;  Surgeon: Candelaria Stagers, DPM;  Location: [facility] ORS;  Service: Podiatry;  Laterality: Bilateral;   HAMMER TOE SURGERY Right 09/28/2021   Middle toe   I & D EXTREMITY Right 10/30/2019   Procedure: IRRIGATION AND DEBRIDEMENT EXTREMITY, right leg possible wound vac placement;  Surgeon: Teryl Lucy, MD;  Location: [facility] OR;  Service: Orthopedics;  Laterality: Right;   REPLACEMENT TOTAL KNEE Bilateral    TEE WITHOUT CARDIOVERSION N/A 03/05/2022   Procedure: TRANSESOPHAGEAL ECHOCARDIOGRAM (TEE);  Surgeon: Antonieta Iba, MD;  Location: [facility] ORS;  Service: Cardiovascular;  Laterality: N/A;   TIBIA FRACTURE SURGERY     VITRECTOMY PARS PLANA 23 GAUGE FOR ENDOPHTHALMITIS Left 03/04/2020   Procedure: VITRECTOMY PARS PLANA 23 GAUGE FOR ENDOPHTHALMITIS;  Surgeon: Carmela Rima, MD;  Location[facility] OR;  Service: Ophthalmology;  Laterality: Left;    Allergies  Allergen Reactions   Cephalexin Rash   Dilaudid [Hydromorphone] Other (See Comments)    Hallucinations     Objective/Physical Exam  General: The patient is alert and oriented x3 in no acute distress.  Dermatology:  Overall improvement.  Wound #1 noted to the distal tip of the right fourth toe measuring approximately 0.2 x 0.2 x0.1 cm (LxWxD).   To the noted ulceration(s), there is no eschar. There is a moderate amount of slough, fibrin, and necrotic tissue noted. Granulation tissue and wound base is red. There is a minimal amount of serosanguineous drainage noted. There is no exposed bone muscle-tendon ligament or joint. There is no malodor. Periwound  integrity is intact. Skin is warm, dry and supple bilateral lower extremities.  Vascular: Palpable pedal pulses bilaterally. No edema or erythema noted. Capillary refill within normal limits.  Neurological: Light touch and protective threshold diminished bilaterally.   Musculoskeletal Exam: H/o prior amputation right second toe amputation.  Hammertoe contracture deformity also noted to the remaining digits of the foot.  This is contributory to the ulcer to the distal tip of the toe with  Radiographic exam RT foot 08/05/2023: No osseous erosions noted that would be concerning for osteomyelitis to the right fourth toe.  Absence of the second toe noted at the level of the MTP.  Hammertoe contracture deformity noted to the lesser digits  Assessment: 1.  Ulcer distal tip of the right fourth toe 2.  Hammertoe contracture lesser digits   Plan of Care:  -Patient was evaluated. -Medically necessary excisional debridement including subcutaneous tissue was performed using a tissue nipper and a chisel blade. Excisional debridement of all the necrotic nonviable tissue down to healthy bleeding viable tissue was performed with post-debridement measurements same as pre-. - Continue doxycycline until completed -Continue gentamicin cream with a light Band-Aid daily -Return to clinic 2 weeks  Felecia Shelling, DPM [facility]  Dr. Felecia Shelling, DPM    [address] [place], [place] [address]                Office [phone]  Fax [phone]

## 2023-08-27 ENCOUNTER — Telehealth (HOSPITAL_BASED_OUTPATIENT_CLINIC_OR_DEPARTMENT_OTHER): Payer: Self-pay | Admitting: *Deleted

## 2023-08-27 ENCOUNTER — Telehealth: Payer: Self-pay | Admitting: *Deleted

## 2023-08-27 NOTE — Telephone Encounter (Signed)
Pt has been scheduled tele pre op appt 09/12/23. Med rec and consent are done.

## 2023-08-27 NOTE — Telephone Encounter (Signed)
Patient with diagnosis of afib on Eliquis for anticoagulation.    Procedure: colonoscopy Date of procedure: 09/23/23  CHA2DS2-VASc Score = 6  This indicates a 9.7% annual risk of stroke. The patient's score is based upon: CHF History: 1 HTN History: 1 Diabetes History: 0 Stroke History: 2 Vascular Disease History: 1 Age Score: 1 Gender Score: 0   Stroke noted incidentally on 2003 MRI  CrCl 81mL/min using adjusted body weight Platelet count 228K  Per office protocol, patient can hold Eliquis for 1-2 days prior to procedure.    **This guidance is not considered finalized until pre-operative APP has relayed final recommendations.**

## 2023-08-27 NOTE — Telephone Encounter (Signed)
Name: Joel Dominguez  DOB: 10-09-1951  MRN: [id number]  Primary Cardiologist: Norman Herrlich, MD   Preoperative team, please contact this patient and set up a phone call appointment for further preoperative risk assessment. Please obtain consent and complete medication review. Thank you for your help.  I confirm that guidance regarding antiplatelet and oral anticoagulation therapy has been completed and, if necessary, noted below.  Per office protocol, patient can hold Eliquis for 1-2 days prior to procedure.   I also confirmed the patient resides in the state of [place]. As per [facility] Medical Board telemedicine laws, the patient must reside in the state in which the provider is licensed.   Ronney Asters, NP 08/27/2023, 1:47 PM [facility]

## 2023-08-27 NOTE — Telephone Encounter (Signed)
Pt has been scheduled tele pre op appt 09/12/23. Med rec and consent are done.     Patient Consent for Virtual Visit        Joel Dominguez has provided verbal consent on 08/27/2023 for a virtual visit (video or telephone).   CONSENT FOR VIRTUAL VISIT FOR:  Joel Dominguez  By participating in this virtual visit I agree to the following:  I hereby voluntarily request, consent and authorize [facility] and its employed or contracted physicians, physician assistants, nurse practitioners or other licensed health care professionals (the Practitioner), to provide me with telemedicine health care services (the "Services") as deemed necessary by the treating Practitioner. I acknowledge and consent to receive the Services by the Practitioner via telemedicine. I understand that the telemedicine visit will involve communicating with the Practitioner through live audiovisual communication technology and the disclosure of certain medical information by electronic transmission. I acknowledge that I have been given the opportunity to request an in-person assessment or other available alternative prior to the telemedicine visit and am voluntarily participating in the telemedicine visit.  I understand that I have the right to withhold or withdraw my consent to the use of telemedicine in the course of my care at any time, without affecting my right to future care or treatment, and that the Practitioner or I may terminate the telemedicine visit at any time. I understand that I have the right to inspect all information obtained and/or recorded in the course of the telemedicine visit and may receive copies of available information for a reasonable fee.  I understand that some of the potential risks of receiving the Services via telemedicine include:  Delay or interruption in medical evaluation due to technological equipment failure or disruption; Information transmitted may not be sufficient (e.g. poor  resolution of images) to allow for appropriate medical decision making by the Practitioner; and/or  In rare instances, security protocols could fail, causing a breach of personal health information.  Furthermore, I acknowledge that it is my responsibility to provide information about my medical history, conditions and care that is complete and accurate to the best of my ability. I acknowledge that Practitioner's advice, recommendations, and/or decision may be based on factors not within their control, such as incomplete or inaccurate data provided by me or distortions of diagnostic images or specimens that may result from electronic transmissions. I understand that the practice of medicine is not an exact science and that Practitioner makes no warranties or guarantees regarding treatment outcomes. I acknowledge that a copy of this consent can be made available to me via my patient portal [facility] MyChart), or I can request a printed copy by calling the office of [facility].    I understand that my insurance will be billed for this visit.   I have read or had this consent read to me. I understand the contents of this consent, which adequately explains the benefits and risks of the Services being provided via telemedicine.  I have been provided ample opportunity to ask questions regarding this consent and the Services and have had my questions answered to my satisfaction. I give my informed consent for the services to be provided through the use of telemedicine in my medical care

## 2023-08-27 NOTE — Telephone Encounter (Signed)
Pre-operative Risk Assessment    Patient Name: Joel Dominguez  DOB: 1951-01-30 MRN: [id number]      Request for Surgical Clearance    Procedure:   Colonoscopy  Date of Surgery:  Clearance 09/23/23                                 Surgeon:  Dr. Willis Modena Surgeon's Group or Practice Name:  [facility] Phone number:  [phone] Fax number:  [phone]   Type of Clearance Requested:   - Medical  - Pharmacy:  Hold Apixaban (Eliquis) Not Indicated.   Type of Anesthesia:   Propofol   Additional requests/questions:    Signed, Emmit Pomfret   08/27/2023, 11:08 AM

## 2023-09-12 ENCOUNTER — Ambulatory Visit: Payer: Medicare Other | Attending: Nurse Practitioner

## 2023-09-12 DIAGNOSIS — Z01810 Encounter for preprocedural cardiovascular examination: Secondary | ICD-10-CM

## 2023-09-12 NOTE — Progress Notes (Signed)
Virtual Visit via Telephone Note   Because of Joel Dominguez's co-morbid illnesses, he is at least at moderate risk for complications without adequate follow up.  This format is felt to be most appropriate for this patient at this time.  The patient did not have access to video technology/had technical difficulties with video requiring transitioning to audio format only (telephone).  All issues noted in this document were discussed and addressed.  No physical exam could be performed with this format.  Please refer to the patient's chart for his consent to telehealth for [facility].  Evaluation Performed:  Preoperative cardiovascular risk assessment _____________   Date:  09/12/2023   Patient ID:  Joel Dominguez, DOB Sep 17, 1951, MRN [id number] Patient Location:  Home Provider location:   Office  Primary Care Provider:  Sandre Kitty, Dominguez Primary Cardiologist:  Joel Herrlich, Dominguez  Chief Complaint / Patient Profile   72 y.o. y/o male with a h/o, HFpEF permanent AF, HTN AVS who is pending colonoscopy and presents today for telephonic preoperative cardiovascular risk assessment.  History of Present Illness    Joel Dominguez is a 72 y.o. male who presents via audio/video conferencing for a telehealth visit today.  Pt was last seen in cardiology clinic on 04/24/2023 by Joel Dominguez.  At that time Joel Dominguez was doing well and had lost 65 pounds and was working on losing another 20.  He was also noted to have resolution of shortness of breath as well as peripheral edema.  Patient's blood pressure during visit was well-controlled and was tolerating diuretics as well as blood thinner without difficulty.  The patient is now pending procedure as outlined above. Since his last visit, he has lost 140 lbs total.  He is staying active and does swimming and also does various outdoor activities such as cutting grass, weeding and gardening.  His blood pressure today was well-controlled at 115/70 and  heart rate was 78 bpm.  He denies chest pain, shortness of breath, lower extremity edema, fatigue, palpitations, melena, hematuria, hemoptysis, diaphoresis, weakness, presyncope, syncope, orthopnea, and PND.   Per office protocol, patient can hold Eliquis for 1-2 days prior to procedure.   Past Medical History    Past Medical History:  Diagnosis Date   A-fib 10/08/2016   Aortic stenosis 11/06/2017   Atherosclerotic heart disease of native coronary artery with unspecified angina pectoris (HCC) 10/08/2016   Cellulitis 11/2019   right lower extremity   CHF (congestive heart failure) (HCC)    Chronic anticoagulation 10/08/2016   Chronic combined systolic and diastolic heart failure (HCC) 10/08/2016   EF range 35-45% in 2016   Chronic congestive heart failure (HCC)    Chronic ulcer of toe (HCC) 08/04/2019   Coagulation disorder (HCC) 04/30/2019   Foot drop- R 10/08/2016   Patient known foot drop for many years due to lumbar spinal stenosis.-Over the past couple of months symptoms have gotten worse.  He feels he is tripping more and cannot lift his foot up as much. Toes are curling and calluses on end of toes.    - Did see spinal surgeon many yrs ago.  Declined sx at that time.      Generalized OA 10/08/2016   Ortho doc-  Dr. Jacqlyn Dominguez in [place]- both knees replaced 08, 09.      H/O noncompliance with medical treatment, presenting hazards to health 10/22/2017   Heart disease    Hypertension    Hypertensive heart disease with heart failure (HCC) 11/10/2017  Longstanding persistent atrial fibrillation (HCC)    Obesity, Class III, BMI 40-49.9 (morbid obesity) (HCC) 10/08/2016   OSA (obstructive sleep apnea) 10/08/2016   Bi-PAP nitely- been 20+ yrs now   Osteoarthritis 11/19/2017   Pulmonary hypertension (HCC) 10/08/2016   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 fascicular block) 11/07/2017   Sleep apnea with use of continuous positive airway pressure (CPAP) 10/08/2016   Bi-PAP nitely- been  20+ yrs now   Spinal stenosis, lumbar region, with neurogenic claudication 10/08/2016   Back specialist in [place]-    only surgeon he would use- pt declined referral here for back pain mgt     Stroke [facility])    Past Surgical History:  Procedure Laterality Date   AMPUTATION TOE Bilateral 03/03/2022   Procedure: BILATERAL 2ND DIGIT AMPUTATION;  Surgeon: Joel Dominguez, DPM;  Location: [facility] ORS;  Service: Podiatry;  Laterality: Bilateral;   HAMMER TOE SURGERY Right 09/28/2021   Middle toe   I & D EXTREMITY Right 10/30/2019   Procedure: IRRIGATION AND DEBRIDEMENT EXTREMITY, right leg possible wound vac placement;  Surgeon: Joel Lucy, Dominguez;  Location: [facility] OR;  Service: Orthopedics;  Laterality: Right;   REPLACEMENT TOTAL KNEE Bilateral    TEE WITHOUT CARDIOVERSION N/A 03/05/2022   Procedure: TRANSESOPHAGEAL ECHOCARDIOGRAM (TEE);  Surgeon: Joel Iba, Dominguez;  Location: [facility] ORS;  Service: Cardiovascular;  Laterality: N/A;   TIBIA FRACTURE SURGERY     VITRECTOMY PARS PLANA 23 GAUGE FOR ENDOPHTHALMITIS Left 03/04/2020   Procedure: VITRECTOMY PARS PLANA 23 GAUGE FOR ENDOPHTHALMITIS;  Surgeon: Joel Rima, Dominguez;  Location: [facility] OR;  Service: Ophthalmology;  Laterality: Left;    Allergies  Allergies  Allergen Reactions   Cephalexin Rash   Dilaudid [Hydromorphone] Other (See Comments)    Hallucinations    Home Medications    Prior to Admission medications   Medication Sig Start Date End Date Taking? Authorizing Provider  B Complex-C (B-COMPLEX WITH VITAMIN C) tablet Take 1 tablet by mouth daily. 11/17/19   Joel Dominguez  bumetanide (BUMEX) 1 MG tablet TAKE 1 TABLET BY MOUTH ONCE DAILY. TAKE AN ADDITIONAL TABLET ON MONDAY, WEDNESDAY, AND FRIDAY. 05/14/22   Joel Daub, Dominguez  carvedilol (COREG) 12.5 MG tablet TAKE 1 TABLET BY MOUTH TWICE A DAY WITH A MEAL 07/16/23   Joel Daub, Dominguez  doxycycline (VIBRA-TABS) 100 MG tablet Take 1 tablet (100 mg total) by mouth 2 (two) times daily. 08/05/23    Joel Dominguez, DPM  ELIQUIS 5 MG TABS tablet TAKE 1 TABLET BY MOUTH TWICE  DAILY 05/26/23   Joel Daub, Dominguez  gentamicin cream (GARAMYCIN) 0.1 % Apply 1 Application topically 2 (two) times daily. 11/15/22   Joel Dominguez, DPM  hydrocerin (EUCERIN) CREA Apply 1 application topically 2 (two) times daily. 11/17/19   Joel Dominguez  HYDROcodone-acetaminophen (NORCO) 10-325 MG tablet Take 1 tablet by mouth every 8 (eight) hours as needed for moderate pain.    Provider, Historical, Dominguez  Multiple Vitamin (MULTIVITAMIN) tablet Take 1 tablet by mouth daily.    Provider, Historical, Dominguez  spironolactone (ALDACTONE) 25 MG tablet TAKE 1 TABLET BY MOUTH DAILY 07/16/23   Joel Daub, Dominguez  Vitamin D, Ergocalciferol, (DRISDOL) 1.25 MG (50000 UNIT) CAPS capsule TAKE 1 CAPSULE BY MOUTH EVERY 7 DAYS. 05/06/22   Mayer Masker, Dominguez    Physical Exam    Vital Signs:  Saturnino Curling does not have vital signs available for review today.115/70  Given telephonic  nature of communication, physical exam is limited. AAOx3. NAD. Normal affect.  Speech and respirations are unlabored.  Accessory Clinical Findings    None  Assessment & Plan    1.  Preoperative Cardiovascular Risk Assessment: -Patient's RCRI score is 0.9%  -The patient affirms he has been doing well without any new cardiac symptoms. They are able to achieve 5 METS without cardiac limitations. Therefore, based on ACC/AHA guidelines, the patient would be at acceptable risk for the planned procedure without further cardiovascular testing. The patient was advised that if he develops new symptoms prior to surgery to contact our office to arrange for a follow-up visit, and he verbalized understanding.    The patient was advised that if he develops new symptoms prior to surgery to contact our office to arrange for a follow-up visit, and he verbalized understanding.  Per office protocol, patient can hold Eliquis for 1-2 days prior to procedure.   A  copy of this note will be routed to requesting surgeon.  Time:   Today, I have spent 6 minutes with the patient with telehealth technology discussing medical history, symptoms, and management plan.     Napoleon Form, Leodis Rains, NP  09/12/2023, 7:13 AM

## 2023-09-18 DIAGNOSIS — Z79899 Other long term (current) drug therapy: Secondary | ICD-10-CM | POA: Diagnosis not present

## 2023-09-18 DIAGNOSIS — M5441 Lumbago with sciatica, right side: Secondary | ICD-10-CM | POA: Diagnosis not present

## 2023-09-18 DIAGNOSIS — M25562 Pain in left knee: Secondary | ICD-10-CM | POA: Diagnosis not present

## 2023-09-18 DIAGNOSIS — G894 Chronic pain syndrome: Secondary | ICD-10-CM | POA: Diagnosis not present

## 2023-09-18 DIAGNOSIS — M25561 Pain in right knee: Secondary | ICD-10-CM | POA: Diagnosis not present

## 2023-09-18 DIAGNOSIS — G8929 Other chronic pain: Secondary | ICD-10-CM | POA: Diagnosis not present

## 2023-09-19 ENCOUNTER — Ambulatory Visit (INDEPENDENT_AMBULATORY_CARE_PROVIDER_SITE_OTHER): Payer: Medicare Other | Admitting: Family Medicine

## 2023-09-19 ENCOUNTER — Encounter: Payer: Self-pay | Admitting: Family Medicine

## 2023-09-19 DIAGNOSIS — Z23 Encounter for immunization: Secondary | ICD-10-CM | POA: Diagnosis not present

## 2023-09-19 NOTE — Progress Notes (Signed)
Patient is here for his Flu vaccination pt tolerated the injection

## 2023-09-23 DIAGNOSIS — D123 Benign neoplasm of transverse colon: Secondary | ICD-10-CM | POA: Diagnosis not present

## 2023-09-23 DIAGNOSIS — Z09 Encounter for follow-up examination after completed treatment for conditions other than malignant neoplasm: Secondary | ICD-10-CM | POA: Diagnosis not present

## 2023-09-23 DIAGNOSIS — K5730 Diverticulosis of large intestine without perforation or abscess without bleeding: Secondary | ICD-10-CM | POA: Diagnosis not present

## 2023-09-23 DIAGNOSIS — Z860101 Personal history of adenomatous and serrated colon polyps: Secondary | ICD-10-CM | POA: Diagnosis not present

## 2023-09-23 DIAGNOSIS — D122 Benign neoplasm of ascending colon: Secondary | ICD-10-CM | POA: Diagnosis not present

## 2023-09-23 LAB — HM COLONOSCOPY

## 2023-09-25 DIAGNOSIS — D122 Benign neoplasm of ascending colon: Secondary | ICD-10-CM | POA: Diagnosis not present

## 2023-09-25 DIAGNOSIS — D123 Benign neoplasm of transverse colon: Secondary | ICD-10-CM | POA: Diagnosis not present

## 2023-10-15 NOTE — Progress Notes (Unsigned)
Cardiology Office Note:    Date:  10/16/2023   ID:  Joel Dominguez, DOB 06/22/1951, MRN [id number]  PCP:  Sandre Kitty, MD  Cardiologist:  Norman Herrlich, MD    Referring MD: Sandre Kitty, MD    ASSESSMENT:    1. Chronic atrial fibrillation (HCC)   2. Chronic anticoagulation   3. Hypertensive heart disease with heart failure (HCC)   4. Nonrheumatic aortic valve stenosis   5. 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 fascicular block)    PLAN:    In order of problems listed above:  Joel Dominguez continues to do well rate controlled atrial fibrillation with his current beta-blocker and continue his anticoagulant Hypertension controlled blood pressure at target continue beta-blocker and is MRA He had no gradient on his last echocardiogram a year ago I do not feel the need to repeat imaging at this time Stable conduction system disease asymptomatic   Next appointment: 1 year   Medication Adjustments/Labs and Tests Ordered: Current medicines are reviewed at length with the patient today.  Concerns regarding medicines are outlined above.  No orders of the defined types were placed in this encounter.  No orders of the defined types were placed in this encounter.    History of Present Illness:    Joel Dominguez is a 72 y.o. male with a hx of chronic permanent atrial fibrillation chronic anticoagulation mild aortic stenosis hypertensive heart disease or chronic diastolic heart failure bifascicular heart block and most recent EF 45 to 50% in June 2023 last seen 04/28/2023.  Compliance with diet, lifestyle and medications: Yes  Both his wife and granddaughter present He is feeling well he has had no cardiovascular symptoms edema shortness of breath chest pain palpitation or syncope He is not on lipid-lowering therapy I raised the issue and he declines Past Medical History:  Diagnosis Date   A-fib 10/08/2016   Aortic stenosis 11/06/2017   Atherosclerotic heart disease of  native coronary artery with unspecified angina pectoris (HCC) 10/08/2016   Cellulitis 11/2019   right lower extremity   CHF (congestive heart failure) (HCC)    Chronic anticoagulation 10/08/2016   Chronic combined systolic and diastolic heart failure (HCC) 10/08/2016   EF range 35-45% in 2016   Chronic congestive heart failure (HCC)    Chronic ulcer of toe (HCC) 08/04/2019   Coagulation disorder (HCC) 04/30/2019   Foot drop- R 10/08/2016   Patient known foot drop for many years due to lumbar spinal stenosis.-Over the past couple of months symptoms have gotten worse.  He feels he is tripping more and cannot lift his foot up as much. Toes are curling and calluses on end of toes.    - Did see spinal surgeon many yrs ago.  Declined sx at that time.      Generalized OA 10/08/2016   Ortho doc-  Dr. Jacqlyn Krauss in [place]- both knees replaced 08, 09.      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 fascicular block) 11/07/2017   Sleep apnea with use of continuous positive airway  pressure (CPAP) 10/08/2016   Bi-PAP nitely- been 20+ yrs now   Spinal stenosis, lumbar region, with neurogenic claudication 10/08/2016   Back specialist in [place]-    only surgeon he would use- pt declined referral here for back pain mgt     Stroke [facility])     Current Medications: Current Meds  Medication Sig   B Complex-C (B-COMPLEX WITH VITAMIN C) tablet Take 1 tablet by mouth daily.   bumetanide (BUMEX) 1 MG tablet TAKE 1 TABLET BY MOUTH ONCE DAILY. TAKE AN ADDITIONAL TABLET ON MONDAY,  WEDNESDAY, AND FRIDAY. (Patient taking differently: Take 1 mg by mouth daily.)   carvedilol (COREG) 12.5 MG tablet TAKE 1 TABLET BY MOUTH TWICE A DAY WITH A MEAL (Patient taking differently: Take 12.5 mg by mouth 2 (two) times daily with a meal.)   ELIQUIS 5 MG TABS tablet TAKE 1 TABLET BY MOUTH TWICE  DAILY   gentamicin cream (GARAMYCIN) 0.1 % Apply 1 Application topically 2 (two) times daily.   hydrocerin (EUCERIN) CREA Apply 1 application topically 2 (two) times daily.   Multiple Vitamin (MULTIVITAMIN) tablet Take 1 tablet by mouth daily.   spironolactone (ALDACTONE) 25 MG tablet TAKE 1 TABLET BY MOUTH DAILY (Patient taking differently: Take 25 mg by mouth daily.)   [DISCONTINUED] doxycycline (VIBRA-TABS) 100 MG tablet Take 1 tablet (100 mg total) by mouth 2 (two) times daily.   [DISCONTINUED] HYDROcodone-acetaminophen (NORCO) 10-325 MG tablet Take 1 tablet by mouth every 8 (eight) hours as needed for moderate pain.      EKGs/Labs/Other Studies Reviewed:    The following studies were reviewed today:  Cardiac Studies & Procedures      ECHOCARDIOGRAM  ECHOCARDIOGRAM COMPLETE 03/03/2022  Narrative ECHOCARDIOGRAM REPORT    Patient Name:   Joel Dominguez Date of Exam: 03/03/2022 Medical Rec #:  [id number]      Height:       72.0 in Accession #:    [id number]     Weight:       310.0 lb Date of Birth:  [birth year]       BSA:          2.567 m Patient Age:    70 years       BP:           150/77 mmHg Patient Gender: M              HR:           103 bpm. Exam Location:  [facility]  Procedure: 2D Echo and Intracardiac Opacification Agent  Indications:     Bacteremia  History:         Patient has prior history of Echocardiogram examinations. Aortic Valve Disease, Arrythmias:Atrial Fibrillation; Risk Factors:Hypertension and Morbid Obesity.  Sonographer:     L Thornton-Maynard Referring Phys:  [id number] Diagnosing Phys: Debbe Odea MD   Sonographer Comments: Technically  difficult study due to poor echo windows and patient is morbidly obese. Image acquisition challenging due to patient body habitus. IMPRESSIONS   1. Left ventricular ejection fraction, by estimation, is 50 to 55%. Left ventricular ejection fraction by 2D MOD biplane is 54.9 %. The left ventricle has low normal function. The left ventricle has no regional wall motion abnormalities. Left ventricular diastolic parameters are indeterminate. 2. Right ventricular systolic function is normal. The right ventricular size is normal. There is moderately elevated pulmonary artery systolic pressure. The estimated right ventricular systolic pressure is 53.2 mmHg. 3. The mitral valve is degenerative.  Mild mitral valve regurgitation. 4. The aortic valve was not well visualized. Aortic valve regurgitation is mild. Aortic valve sclerosis/calcification is present, without any evidence of aortic stenosis. Aortic valve mean gradient measures 7.0 mmHg. 5. Aortic dilatation noted. There is mild dilatation of the aortic root and of the ascending aorta, measuring 40 mm. 6. The inferior vena cava is dilated in size with <50% respiratory variability, suggesting right atrial pressure of 15 mmHg.  Conclusion(s)/Recommendation(s): No evidence of valvular vegetations on this transthoracic echocardiogram. Consider a transesophageal echocardiogram to exclude infective endocarditis if clinically indicated.  FINDINGS Left Ventricle: Left ventricular ejection fraction, by estimation, is 50 to 55%. Left ventricular ejection fraction by 2D MOD biplane is 54.9 %. The left ventricle has low normal function. The left ventricle has no regional wall motion abnormalities. Definity contrast agent was given IV to delineate the left ventricular endocardial borders. The left ventricular internal cavity size was normal in size. There is no left ventricular hypertrophy. Left ventricular diastolic parameters are indeterminate.  Right Ventricle: The  right ventricular size is normal. No increase in right ventricular wall thickness. Right ventricular systolic function is normal. There is moderately elevated pulmonary artery systolic pressure. The tricuspid regurgitant velocity is 3.09 m/s, and with an assumed right atrial pressure of 15 mmHg, the estimated right ventricular systolic pressure is 53.2 mmHg.  Left Atrium: Left atrial size was normal in size.  Right Atrium: Right atrial size was normal in size.  Pericardium: There is no evidence of pericardial effusion.  Mitral Valve: The mitral valve is degenerative in appearance. Mild mitral annular calcification. Mild mitral valve regurgitation. MV peak gradient, 4.2 mmHg. The mean mitral valve gradient is 2.0 mmHg.  Tricuspid Valve: The tricuspid valve is normal in structure. Tricuspid valve regurgitation is mild.  Aortic Valve: The aortic valve was not well visualized. Aortic valve regurgitation is mild. Aortic regurgitation PHT measures 453 msec. Aortic valve sclerosis/calcification is present, without any evidence of aortic stenosis. Aortic valve mean gradient measures 7.0 mmHg. Aortic valve peak gradient measures 13.4 mmHg. Aortic valve area, by VTI measures 2.27 cm.  Pulmonic Valve: The pulmonic valve was not well visualized. Pulmonic valve regurgitation is not visualized.  Aorta: Aortic dilatation noted. There is mild dilatation of the aortic root and of the ascending aorta, measuring 40 mm.  Venous: The inferior vena cava is dilated in size with less than 50% respiratory variability, suggesting right atrial pressure of 15 mmHg.  IAS/Shunts: No atrial level shunt detected by color flow Doppler.   LEFT VENTRICLE PLAX 2D                        Biplane EF (MOD) LVOT diam:     2.50 cm         LV Biplane EF:   Left LV SV:         67                               ventricular LV SV Index:   26                               ejection LVOT Area:     4.91 cm                          fraction by 2D MOD  biplane is LV Volumes (MOD)                                54.9 %. LV vol d, MOD    118.9 ml A2C:                           Diastology LV vol d, MOD    125.5 ml      LV e' medial:    3.98 cm/s A4C:                           LV E/e' medial:  24.7 LV vol s, MOD    55.9 ml       LV e' lateral:   6.75 cm/s A2C:                           LV E/e' lateral: 14.6 LV vol s, MOD    57.6 ml A4C: LV SV MOD A2C:   63.0 ml LV SV MOD A4C:   125.5 ml LV SV MOD BP:    67.7 ml  RIGHT VENTRICLE RV S prime:     7.05 cm/s TAPSE (M-mode): 1.2 cm  LEFT ATRIUM             Index        RIGHT ATRIUM           Index LA diam:        4.80 cm 1.87 cm/m   RA Area:     27.20 cm LA Vol (A2C):   84.5 ml 32.92 ml/m  RA Volume:   83.40 ml  32.49 ml/m LA Vol (A4C):   69.7 ml 27.15 ml/m LA Biplane Vol: 76.9 ml 29.96 ml/m AORTIC VALVE                     PULMONIC VALVE AV Area (Vmax):    1.87 cm      PV Vmax:       1.02 m/s AV Area (Vmean):   2.12 cm      PV Peak grad:  4.2 mmHg AV Area (VTI):     2.27 cm AV Vmax:           183.00 cm/s AV Vmean:          125.000 cm/s AV VTI:            0.294 m AV Peak Grad:      13.4 mmHg AV Mean Grad:      7.0 mmHg LVOT Vmax:         69.80 cm/s LVOT Vmean:        54.000 cm/s LVOT VTI:          0.136 m LVOT/AV VTI ratio: 0.46 AI PHT:            453 msec  AORTA Ao Root diam: 4.00 cm Ao Asc diam:  4.00 cm  MITRAL VALVE               TRICUSPID VALVE MV Area (PHT): 6.37 cm    TR Peak grad:   38.2 mmHg MV Area VTI:   2.94 cm    TR Vmax:        309.00 cm/s MV Peak grad:  4.2 mmHg MV Mean grad:  2.0 mmHg  SHUNTS MV Vmax:       1.03 m/s    Systemic VTI:  0.14 m MV Vmean:      70.5 cm/s   Systemic Diam: 2.50 cm MV Decel Time: 119 msec MV E velocity: 98.50 cm/s MV A velocity: 43.70 cm/s MV E/A ratio:  2.25  Debbe Odea MD Electronically signed by Debbe Odea MD Signature Date/Time: 03/03/2022/12:21:47 PM    Final  TEE  ECHO  TEE 03/05/2022  Narrative TRANSESOPHOGEAL ECHO REPORT    Patient Name:   Joel Dominguez Date of Exam: 03/05/2022 Medical Rec #:  [id number]      Height:       72.0 in Accession #:    [id number]     Weight:       310.0 lb Date of Birth:  November 08, 1950       BSA:          2.567 m Patient Age:    70 years       BP:           120/79 mmHg Patient Gender: M              HR:           86 bpm. Exam Location:  [facility]  Procedure: Transesophageal Echo, Color Doppler, Cardiac Doppler and Saline Contrast Bubble Study  Indications:     Not listed on TEE check-in sheet  History:         Patient has prior history of Echocardiogram examinations, most recent 03/03/2022. CHF, Arrythmias:Atrial Fibrillation; Risk Factors:Hypertension.  Sonographer:     Cristela Blue Referring Phys:  [id number] Debbe Odea Diagnosing Phys: Julien Nordmann MD  PROCEDURE: After discussion of the risks and benefits of a TEE, an informed consent was obtained from the patient. TEE procedure time was 25 minutes. The transesophogeal probe was passed without difficulty through the esophogus of the patient. Imaged were obtained with the patient in a left lateral decubitus position. Local oropharyngeal anesthetic was provided with Benzocaine spray and viscous lidocaine. Sedation performed by performing physician. Patients was under conscious sedation during this procedure. Anesthetic administered: of Fentanyl, 4.0mg  of Versed. Image quality was excellent. The patient's vital signs; including heart rate, blood pressure, and oxygen saturation; remained stable throughout the procedure. The patient developed no complications during the procedure.  IMPRESSIONS   1. No valve vegetation noted. 2. Left ventricular ejection fraction, by estimation, is 55 to 60%. The left ventricle has normal function. The left ventricle has no regional wall motion abnormalities. 3. Right ventricular systolic function is normal. The right ventricular size is  mildly enlarged. There is moderately elevated pulmonary artery systolic pressure. The estimated right ventricular systolic pressure is 54.0 mmHg. 4. Left atrial size was moderately dilated. No left atrial/left atrial appendage thrombus was detected. 5. The mitral valve is normal in structure. Mild to moderate mitral valve regurgitation. No evidence of mitral stenosis. 6. Tricuspid valve regurgitation is moderate. 7. The aortic valve is normal in structure. There is mild calcification of the aortic valve. Aortic valve regurgitation is not visualized. Aortic valve sclerosis/calcification is present, without any evidence of aortic stenosis. 8. The inferior vena cava is normal in size with greater than 50% respiratory variability, suggesting right atrial pressure of 3 mmHg. 9. Agitated saline contrast bubble study was negative, with no evidence of any interatrial shunt. 10. There is mild (Grade II) atheroma plaque involving the aortic arch.  Conclusion(s)/Recommendation(s): Normal biventricular function without evidence of hemodynamically significant valvular heart disease.  FINDINGS Left Ventricle: Left ventricular ejection fraction, by estimation, is 55 to 60%. The left ventricle has normal function. The left ventricle has no regional wall motion abnormalities. The left ventricular internal cavity size was normal in size. There is no left ventricular hypertrophy.  Right Ventricle: The right ventricular size is mildly enlarged. No increase in right ventricular wall thickness. Right ventricular systolic function is normal. There is moderately elevated pulmonary artery systolic pressure. The tricuspid regurgitant velocity is 3.50 m/s, and with an assumed right atrial pressure of 5 mmHg, the estimated right ventricular systolic pressure is 54.0 mmHg.  Left Atrium: Left atrial size was moderately dilated. No left atrial/left atrial appendage thrombus was detected.  Right Atrium: Right atrial size was  normal in size.  Pericardium: There is no evidence of pericardial effusion.  Mitral Valve: The mitral valve is normal in structure. Mild to moderate mitral valve regurgitation. No evidence of mitral valve stenosis.  Tricuspid Valve: The tricuspid valve is normal in structure. Tricuspid valve regurgitation is moderate . No evidence of tricuspid stenosis.  Aortic Valve: The aortic valve is normal in structure. There is mild calcification of the aortic valve. Aortic valve regurgitation is not visualized. Aortic valve sclerosis/calcification is present, without any evidence of aortic stenosis.  Pulmonic Valve: The pulmonic valve was normal in structure. Pulmonic valve regurgitation is not visualized. No evidence of pulmonic stenosis.  Aorta: The aortic root is normal in size and structure. There is mild (Grade II) atheroma plaque involving the aortic arch.  Venous: The inferior vena cava is normal in size with greater than 50% respiratory variability, suggesting right atrial pressure of 3 mmHg.  IAS/Shunts: No atrial level shunt detected by color flow Doppler. Agitated saline contrast was given intravenously to evaluate for intracardiac shunting. Agitated saline contrast bubble study was negative, with no evidence of any interatrial shunt.   TRICUSPID VALVE TR Peak grad:   49.0 mmHg TR Vmax:        350.00 cm/s  Julien Nordmann MD Electronically signed by Julien Nordmann MD Signature Date/Time: 03/05/2022/5:51:00 PM    Final                Recent Labs: 04/28/2023: ALT 10; BUN 32; Creatinine, Ser 1.21; Potassium 5.0; Sodium 138  Recent Lipid Panel    Component Value Date/Time   CHOL 156 09/04/2022 1043   TRIG 146 09/04/2022 1043   HDL 41 09/04/2022 1043   CHOLHDL 3.8 09/04/2022 1043   CHOLHDL 4.3 10/08/2016 1144   VLDL 45 (H) 10/08/2016 1144   LDLCALC 89 09/04/2022 1043    Physical Exam:    VS:  BP 126/68 (BP Location: Right Arm, Patient Position: Sitting)   Pulse 88   Ht 6'  (1.829 m)   Wt 257 lb (116.6 kg)   SpO2 96%   BMI 34.86 kg/m     Wt Readings from Last 3 Encounters:  10/16/23 257 lb (116.6 kg)  04/28/23 260 lb (117.9 kg)  01/21/23 279 lb (126.6 kg)     GEN:  Well nourished, well developed in no acute distress HEENT: Normal NECK: No JVD; No carotid bruits LYMPHATICS: No lymphadenopathy CARDIAC: 1 of 6 flow murmur aortic area RRR, no murmurs, rubs, gallops RESPIRATORY:  Clear to auscultation without rales, wheezing or rhonchi  ABDOMEN: Soft, non-tender, non-distended MUSCULOSKELETAL:  No edema; No deformity  SKIN: Warm and dry NEUROLOGIC:  Alert and oriented x 3 PSYCHIATRIC:  Normal affect    Signed, Norman Herrlich, MD  10/16/2023 11:10 AM  [facility]

## 2023-10-16 ENCOUNTER — Encounter: Payer: Self-pay | Admitting: Cardiology

## 2023-10-16 ENCOUNTER — Ambulatory Visit: Payer: Medicare Other | Attending: Cardiology | Admitting: Cardiology

## 2023-10-16 VITALS — BP 126/68 | HR 88 | Ht 72.0 in | Wt 257.0 lb

## 2023-10-16 DIAGNOSIS — Z7901 Long term (current) use of anticoagulants: Secondary | ICD-10-CM | POA: Diagnosis not present

## 2023-10-16 DIAGNOSIS — I482 Chronic atrial fibrillation, unspecified: Secondary | ICD-10-CM

## 2023-10-16 DIAGNOSIS — I110 Hypertensive heart disease with heart failure: Secondary | ICD-10-CM | POA: Diagnosis not present

## 2023-10-16 DIAGNOSIS — I350 Nonrheumatic aortic (valve) stenosis: Secondary | ICD-10-CM

## 2023-10-16 DIAGNOSIS — I452 Bifascicular block: Secondary | ICD-10-CM

## 2023-10-16 NOTE — Patient Instructions (Signed)
Medication Instructions:  Your physician recommends that you continue on your current medications as directed. Please refer to the Current Medication list given to you today.  *If you need a refill on your cardiac medications before your next appointment, please call your pharmacy*   Lab Work: None If you have labs (blood work) drawn today and your tests are completely normal, you will receive your results only by: MyChart Message (if you have MyChart) OR A paper copy in the mail If you have any lab test that is abnormal or we need to change your treatment, we will call you to review the results.   Testing/Procedures: None   Follow-Up: At [facility], you and your health needs are our priority.  As part of our continuing mission to provide you with exceptional heart care, we have created designated Provider Care Teams.  These Care Teams include your primary Cardiologist (physician) and Advanced Practice Providers (APPs -  Physician Assistants and Nurse Practitioners) who all work together to provide you with the care you need, when you need it.  We recommend signing up for the patient portal called "MyChart".  Sign up information is provided on this After Visit Summary.  MyChart is used to connect with patients for Virtual Visits (Telemedicine).  Patients are able to view lab/test results, encounter notes, upcoming appointments, etc.  Non-urgent messages can be sent to your provider as well.   To learn more about what you can do with MyChart, go to [email or web address].    Your next appointment:   6 month(s)  Provider:   Brian Munley, MD    Other Instructions None  

## 2023-11-03 ENCOUNTER — Encounter: Payer: Self-pay | Admitting: Podiatry

## 2023-11-04 MED ORDER — DOXYCYCLINE HYCLATE 100 MG PO TABS: 100.0000 mg | ORAL_TABLET | Freq: Two times a day (BID) | ORAL | 0 refills | Status: AC

## 2023-11-11 ENCOUNTER — Encounter: Payer: Self-pay | Admitting: Podiatry

## 2023-11-11 ENCOUNTER — Ambulatory Visit (INDEPENDENT_AMBULATORY_CARE_PROVIDER_SITE_OTHER): Payer: Medicare Other

## 2023-11-11 ENCOUNTER — Ambulatory Visit: Payer: Medicare Other | Admitting: Podiatry

## 2023-11-11 DIAGNOSIS — L03031 Cellulitis of right toe: Secondary | ICD-10-CM | POA: Diagnosis not present

## 2023-11-11 DIAGNOSIS — M2041 Other hammer toe(s) (acquired), right foot: Secondary | ICD-10-CM | POA: Diagnosis not present

## 2023-11-11 DIAGNOSIS — L97512 Non-pressure chronic ulcer of other part of right foot with fat layer exposed: Secondary | ICD-10-CM

## 2023-11-11 DIAGNOSIS — I739 Peripheral vascular disease, unspecified: Secondary | ICD-10-CM | POA: Diagnosis not present

## 2023-11-11 NOTE — Progress Notes (Signed)
 Chief Complaint  Patient presents with   Wound Check    It's acting up a little bit.    Subjective:  73 y.o. male with PMHx history of prior amputations and ulcers presenting for follow-up evaluation of a recurrent ulcer to the right fourth toe.  The ulcer was doing very well until just recently when the silicone toe crest pad that he wears slipped underneath the toe and cause some irritation.  He contacted our office and is currently on oral doxycycline .  Presenting for further treatment evaluation  Past Medical History:  Diagnosis Date   A-fib 10/08/2016   Aortic stenosis 11/06/2017   Atherosclerotic heart disease of native coronary artery with unspecified angina pectoris (HCC) 10/08/2016   Cellulitis 11/2019   right lower extremity   CHF (congestive heart failure) (HCC)    Chronic anticoagulation 10/08/2016   Chronic combined systolic and diastolic heart failure (HCC) 10/08/2016   EF range 35-45% in 2016   Chronic congestive heart failure (HCC)    Chronic ulcer of toe (HCC) 08/04/2019   Coagulation disorder (HCC) 04/30/2019   Foot drop- R 10/08/2016   Patient known foot drop for many years due to lumbar spinal stenosis.-Over the past couple of months symptoms have gotten worse.  He feels he is tripping more and cannot lift his foot up as much. Toes are curling and calluses on end of toes.    - Did see spinal surgeon many yrs ago.  Declined sx at that time.      Generalized OA 10/08/2016   Ortho doc-  Dr. Bette Homestead in [place]- both knees replaced 08, 09.      H/O noncompliance with medical treatment, presenting hazards to health 10/22/2017   Heart disease    Hypertension    Hypertensive heart disease with heart failure (HCC) 11/10/2017   Longstanding persistent atrial fibrillation (HCC)    Obesity, Class III, BMI 40-49.9 (morbid obesity) (HCC) 10/08/2016   OSA (obstructive sleep apnea) 10/08/2016   Bi-PAP nitely- been 20+ yrs now   Osteoarthritis 11/19/2017   Pulmonary hypertension  (HCC) 10/08/2016   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 fascicular block) 11/07/2017   Sleep apnea with use of continuous positive airway pressure (CPAP) 10/08/2016   Bi-PAP nitely- been 20+ yrs now   Spinal stenosis, lumbar region, with neurogenic claudication 10/08/2016   Back specialist in [place]-    only surgeon he would use- pt declined referral here for back pain mgt     Stroke [facility])     Past Surgical History:  Procedure Laterality Date   AMPUTATION TOE Bilateral 03/03/2022   Procedure: BILATERAL 2ND DIGIT AMPUTATION;  Surgeon: Tobie Franky SQUIBB, DPM;  Location: [facility] ORS;  Service: Podiatry;  Laterality: Bilateral;   HAMMER TOE SURGERY Right 09/28/2021   Middle toe   I & D EXTREMITY Right 10/30/2019   Procedure: IRRIGATION AND DEBRIDEMENT EXTREMITY, right leg possible wound vac placement;  Surgeon: Josefina Chew, MD;  Location: [facility] OR;  Service: Orthopedics;  Laterality: Right;   REPLACEMENT TOTAL KNEE Bilateral    TEE WITHOUT CARDIOVERSION N/A 03/05/2022   Procedure: TRANSESOPHAGEAL ECHOCARDIOGRAM (TEE);  Surgeon: Perla Evalene PARAS, MD;  Location: [facility] ORS;  Service: Cardiovascular;  Laterality: N/A;   TIBIA FRACTURE SURGERY     VITRECTOMY PARS PLANA 23 GAUGE FOR ENDOPHTHALMITIS Left 03/04/2020   Procedure: VITRECTOMY PARS PLANA 23 GAUGE FOR ENDOPHTHALMITIS;  Surgeon: Tobie Baptist, MD;  Location: [facility] OR;  Service: Ophthalmology;  Laterality: Left;    Allergies  Allergen Reactions   Cephalexin Rash   Dilaudid  [Hydromorphone ] Other (See Comments)    Hallucinations     Objective/Physical Exam General: The patient is alert and oriented x3 in no acute distress.  Dermatology:  Ulcer to the distal tip of the right fourth toe approximately 0.5 x 0.5 to 0.1 cm.  Granular wound base.  Healthy bleeding with debridement.  No exposed bone.  There is some mild erythema and edema encompassing the toe.  Vascular: VAS US  ABI WITH/WO TBI 11/20/2022 ABI Findings:   +---------+------------------+-----+--------+--------+  Right   Rt Pressure (mmHg)IndexWaveformComment   +---------+------------------+-----+--------+--------+  Brachial 160                                      +---------+------------------+-----+--------+--------+  PTA     174               1.09 biphasic          +---------+------------------+-----+--------+--------+  DP      111               0.69 biphasic          +---------+------------------+-----+--------+--------+  Great Toe127               0.79                   +---------+------------------+-----+--------+--------+   +---------+------------------+-----+---------+-------+  Left    Lt Pressure (mmHg)IndexWaveform Comment  +---------+------------------+-----+---------+-------+  Brachial 160                                      +---------+------------------+-----+---------+-------+  PTA     191               1.19 triphasic         +---------+------------------+-----+---------+-------+  DP      198               1.24 triphasic         +---------+------------------+-----+---------+-------+  Great Toe133               0.83                   +---------+------------------+-----+---------+-------+   +-------+-----------+-----------+------------+------------+  ABI/TBIToday's ABIToday's TBIPrevious ABIPrevious TBI  +-------+-----------+-----------+------------+------------+  Right 1.09       0.79                                 +-------+-----------+-----------+------------+------------+  Left  1.24       0.84                                 +-------+-----------+-----------+------------+------------+  Summary:  Right: Resting right ankle-brachial index is within normal range. The right toe-brachial index is normal.   Left: Resting left ankle-brachial index is within normal range. The left toe-brachial index is normal.   Neurological: Light touch and  protective threshold diminished bilaterally.   Musculoskeletal Exam: H/o prior amputation right second toe amputation.  Hammertoe contracture deformity also noted to the remaining digits of the foot.  This is contributory to the ulcer to the distal tip of the toe with  Radiographic exam RT foot 11/11/2023: No osseous erosions noted that would be  concerning for osteomyelitis to the right fourth toe.  There is some very superficial radiolucencies however this appears to be within the very superficial layers of skin and not true gas gangrene within the deeper tissues of the toe and foot.  Absence of the second toe noted at the level of the MTP.  Hammertoe contracture deformity noted to the lesser digits  Assessment: 1.  Ulcer distal tip of the right fourth toe 2.  Hammertoe contracture digit of the right foot 3.  History of amputation LT third toe.  DOS: 05/09/2022 4.  History of partial toe amputation RT second digit 5.  Cellulitis right fourth toe  Plan of Care:  -Patient was evaluated. -Medically necessary excisional debridement including subcutaneous tissue was performed using a tissue nipper and a chisel blade. Excisional debridement of all the necrotic nonviable tissue down to healthy bleeding viable tissue was performed with post-debridement measurements same as pre-. - Continue doxycycline  until completed.  Patient states that he has about 7 days left - To address the chronic hammertoe deformity causing the recurrence of the ulcer I do believe it would be beneficial for the patient to go ahead with a flexor tenotomy to alleviate the plantarflexion of the toe.  This was discussed in detail with the patient and he agrees.  The toe was prepped in aseptic manner and localized block was performed using 2% lidocaine  plain with epi totaling 2 mL.  Flexor tenotomy was performed and immediately the contracture of the hammertoe was improved.  The toes and the more rectus alignment -Betadine wet-to-dry  dressings applied.  Leave clean dry and intact x 1 week -Return to clinic 1 week  Thresa EMERSON Sar, DPM [facility]  Dr. Thresa EMERSON Sar, DPM    [address] [place], [place] [address]                Office [phone]  Fax [phone]

## 2023-11-18 ENCOUNTER — Ambulatory Visit (INDEPENDENT_AMBULATORY_CARE_PROVIDER_SITE_OTHER): Payer: Medicare Other

## 2023-11-18 ENCOUNTER — Encounter: Payer: Self-pay | Admitting: Podiatry

## 2023-11-18 ENCOUNTER — Ambulatory Visit: Payer: Medicare Other | Admitting: Podiatry

## 2023-11-18 VITALS — Ht 72.0 in | Wt 257.0 lb

## 2023-11-18 DIAGNOSIS — L97512 Non-pressure chronic ulcer of other part of right foot with fat layer exposed: Secondary | ICD-10-CM | POA: Diagnosis not present

## 2023-11-18 DIAGNOSIS — M2041 Other hammer toe(s) (acquired), right foot: Secondary | ICD-10-CM

## 2023-11-18 NOTE — Progress Notes (Signed)
 Chief Complaint  Patient presents with   Routine Post Op    Right 4th toe    Subjective:  Patient presents today status post flexor tenotomy to the right 4th toe performed in the office last week on 11/11/2023.  Dressings have been clean dry and intact.  He is doing well.  No new complaints  Past Medical History:  Diagnosis Date   A-fib 10/08/2016   Aortic stenosis 11/06/2017   Atherosclerotic heart disease of native coronary artery with unspecified angina pectoris (HCC) 10/08/2016   Cellulitis 11/2019   right lower extremity   CHF (congestive heart failure) (HCC)    Chronic anticoagulation 10/08/2016   Chronic combined systolic and diastolic heart failure (HCC) 10/08/2016   EF range 35-45% in 2016   Chronic congestive heart failure (HCC)    Chronic ulcer of toe (HCC) 08/04/2019   Coagulation disorder (HCC) 04/30/2019   Foot drop- R 10/08/2016   Patient known foot drop for many years due to lumbar spinal stenosis.-Over the past couple of months symptoms have gotten worse.  He feels he is tripping more and cannot lift his foot up as much. Toes are curling and calluses on end of toes.    - Did see spinal surgeon many yrs ago.  Declined sx at that time.      Generalized OA 10/08/2016   Ortho doc-  Dr. Bette Homestead in [place]- both knees replaced 08, 09.      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 fascicular block) 11/07/2017   Sleep apnea with use of continuous positive airway pressure (CPAP) 10/08/2016   Bi-PAP nitely- been 20+ yrs now   Spinal  stenosis, lumbar region, with neurogenic claudication 10/08/2016   Back specialist in [place]-    only surgeon he would use- pt declined referral here for back pain mgt     Stroke [facility])     Past Surgical History:  Procedure Laterality Date   AMPUTATION TOE Bilateral 03/03/2022   Procedure: BILATERAL 2ND DIGIT AMPUTATION;  Surgeon: Tobie Franky SQUIBB, DPM;  Location: [facility] ORS;  Service: Podiatry;  Laterality: Bilateral;   HAMMER TOE SURGERY Right 09/28/2021   Middle toe   I & D EXTREMITY Right 10/30/2019   Procedure: IRRIGATION AND DEBRIDEMENT EXTREMITY, right leg possible wound vac placement;  Surgeon: Josefina Chew, MD;  Location: [facility] OR;  Service: Orthopedics;  Laterality: Right;   REPLACEMENT TOTAL KNEE Bilateral    TEE WITHOUT CARDIOVERSION N/A 03/05/2022   Procedure: TRANSESOPHAGEAL ECHOCARDIOGRAM (TEE);  Surgeon: Perla Evalene PARAS, MD;  Location: [facility] ORS;  Service: Cardiovascular;  Laterality: N/A;   TIBIA FRACTURE SURGERY     VITRECTOMY PARS PLANA 23 GAUGE FOR ENDOPHTHALMITIS Left 03/04/2020   Procedure: VITRECTOMY PARS PLANA 23 GAUGE FOR ENDOPHTHALMITIS;  Surgeon: Tobie Baptist, MD;  Location: [facility] OR;  Service: Ophthalmology;  Laterality: Left;    Allergies  Allergen Reactions   Cephalexin Rash   Dilaudid  [Hydromorphone ] Other (See Comments)    Hallucinations    Objective/Physical Exam Neurovascular status intact.  The fourth toe is in rectus  alignment with alleviation of the hammertoe contracture deformity.  Overall significant improvement in the appearance and condition of the toe.  No new complaints   Assessment: 1. s/p flexor tenotomy of the right fourth toe performed in the office on 11/11/2023 2.  Ulcer distal tip of the right fourth toe  Plan of Care:  -Patient was evaluated.  -Continue gentamicin  cream to the distal toe ulcer -Patient may resume regular shoes -Return to clinic 3 weeks  Thresa EMERSON Sar, DPM [facility]  Dr. Thresa EMERSON Sar, DPM    [address] [place], [place] [address]                Office [phone]  Fax [phone]

## 2023-11-18 NOTE — Addendum Note (Signed)
 Addended by: Felecia Shelling on: 11/18/2023 11:41 AM   Modules accepted: Level of Service

## 2023-12-09 ENCOUNTER — Ambulatory Visit: Payer: Medicare Other | Admitting: Podiatry

## 2023-12-23 DIAGNOSIS — M25561 Pain in right knee: Secondary | ICD-10-CM | POA: Diagnosis not present

## 2023-12-23 DIAGNOSIS — M25562 Pain in left knee: Secondary | ICD-10-CM | POA: Diagnosis not present

## 2023-12-23 DIAGNOSIS — Z5181 Encounter for therapeutic drug level monitoring: Secondary | ICD-10-CM | POA: Diagnosis not present

## 2023-12-23 DIAGNOSIS — G894 Chronic pain syndrome: Secondary | ICD-10-CM | POA: Diagnosis not present

## 2023-12-23 DIAGNOSIS — Z79899 Other long term (current) drug therapy: Secondary | ICD-10-CM | POA: Diagnosis not present

## 2023-12-23 DIAGNOSIS — M5441 Lumbago with sciatica, right side: Secondary | ICD-10-CM | POA: Diagnosis not present

## 2024-01-13 ENCOUNTER — Other Ambulatory Visit: Payer: Self-pay | Admitting: Cardiology

## 2024-01-13 DIAGNOSIS — I110 Hypertensive heart disease with heart failure: Secondary | ICD-10-CM

## 2024-01-22 ENCOUNTER — Ambulatory Visit: Payer: Medicare Other

## 2024-01-22 DIAGNOSIS — Z0000 Encounter for general adult medical examination without abnormal findings: Secondary | ICD-10-CM

## 2024-01-22 NOTE — Patient Instructions (Signed)
 Mr. Leyh , Thank you for taking time to come for your Medicare Wellness Visit. I appreciate your ongoing commitment to your health goals. Please review the following plan we discussed and let me know if I can assist you in the future.   Referrals/Orders/Follow-Ups/Clinician Recommendations: none  This is a list of the screening recommended for you and due dates:  Health Maintenance  Topic Date Due   Hepatitis C Screening  Never done   DTaP/Tdap/Td vaccine (1 - Tdap) Never done   Zoster (Shingles) Vaccine (1 of 2) Never done   Pneumonia Vaccine (3 of 3 - PPSV23 or PCV20) 05/06/2016   COVID-19 Vaccine (3 - Moderna risk series) 02/12/2020   Medicare Annual Wellness Visit  01/21/2025   Colon Cancer Screening  09/22/2033   Flu Shot  Completed   HPV Vaccine  Aged Out    Advanced directives: (Provided) Advance directive discussed with you today. I have provided a copy for you to complete at home and have notarized. Once this is complete, please bring a copy in to our office so we can scan it into your chart.   Next Medicare Annual Wellness Visit scheduled for next year: Yes  insert Preventive Care attachment Insert FALL PREVENTION attachment if needed

## 2024-01-22 NOTE — Progress Notes (Signed)
 Subjective:   Joel Dominguez is a 73 y.o. who presents for a Medicare Wellness preventive visit.  Visit Complete: Virtual I connected with  Joel Dominguez on 01/22/24 by a video and audio enabled telemedicine application and verified that I am speaking with the correct person using two identifiers.  Patient Location: Home  Provider Location: Home Office  I discussed the limitations of evaluation and management by telemedicine. The patient expressed understanding and agreed to proceed.  Vital Signs: Because this visit was a virtual/telehealth visit, some criteria may be missing or patient reported. Any vitals not documented were not able to be obtained and vitals that have been documented are patient reported.    Persons Participating in Visit: Patient.  AWV Questionnaire: Yes: Patient Medicare AWV questionnaire was completed by the patient on 01/22/2024; I have confirmed that all information answered by patient is correct and no changes since this date.  Cardiac Risk Factors include: advanced age (>40men, >12 women);hypertension;male gender     Objective:    Today's Vitals   01/22/24 1408  PainSc: 4    There is no height or weight on file to calculate BMI.     01/22/2024    2:13 PM 03/05/2022   12:28 PM 03/01/2022   12:18 PM 10/15/2021   11:37 AM 11/11/2019    5:48 PM 11/09/2019    7:59 AM 04/15/2019   10:11 AM  Advanced Directives  Does Patient Have a Medical Advance Directive? Yes Yes No Yes Yes Yes No  Type of Estate agent of [place];Living will Healthcare Power of [place];Living will  Living will;Healthcare Power of [employer] Power of [place];Living will Healthcare Power of [place];Living will   Does patient want to make changes to medical advance directive?  No - Patient declined   No - Patient declined No - Patient declined   Copy of Healthcare Power of Attorney in Chart? No - copy requested No - copy requested   No - copy requested No -  copy requested   Would patient like information on creating a medical advance directive?   No - Patient declined No - Patient declined       Current Medications (verified) Outpatient Encounter Medications as of 01/22/2024  Medication Sig   B Complex-C (B-COMPLEX WITH VITAMIN C) tablet Take 1 tablet by mouth daily.   bumetanide (BUMEX) 1 MG tablet TAKE 1 TABLET BY MOUTH ONCE DAILY. TAKE AN ADDITIONAL TABLET ON MONDAY, WEDNESDAY, AND FRIDAY. (Patient taking differently: Take 1 mg by mouth daily.)   carvedilol (COREG) 12.5 MG tablet TAKE 1 TABLET BY MOUTH TWICE A DAY WITH A MEAL   ELIQUIS 5 MG TABS tablet TAKE 1 TABLET BY MOUTH TWICE  DAILY   gentamicin cream (GARAMYCIN) 0.1 % Apply 1 Application topically 2 (two) times daily.   hydrocerin (EUCERIN) CREA Apply 1 application topically 2 (two) times daily.   Multiple Vitamin (MULTIVITAMIN) tablet Take 1 tablet by mouth daily.   spironolactone (ALDACTONE) 25 MG tablet TAKE 1 TABLET BY MOUTH DAILY   No facility-administered encounter medications on file as of 01/22/2024.    Allergies (verified) Cephalexin and Dilaudid [hydromorphone]   History: Past Medical History:  Diagnosis Date   A-fib 10/08/2016   Allergy    Aortic stenosis 11/06/2017   Atherosclerotic heart disease of native coronary artery with unspecified angina pectoris (HCC) 10/08/2016   Cataract    Cellulitis 11/2019   right lower extremity   CHF (congestive heart failure) (HCC)    Chronic anticoagulation  10/08/2016   Chronic combined systolic and diastolic heart failure (HCC) 10/08/2016   EF range 35-45% in 2016   Chronic congestive heart failure (HCC)    Chronic ulcer of toe (HCC) 08/04/2019   Coagulation disorder (HCC) 04/30/2019   Foot drop- R 10/08/2016   Patient known foot drop for many years due to lumbar spinal stenosis.-Over the past couple of months symptoms have gotten worse.  He feels he is tripping more and cannot lift his foot up as much. Toes are curling and  calluses on end of toes.    - Did see spinal surgeon many yrs ago.  Declined sx at that time.      Generalized OA 10/08/2016   Ortho doc-  Dr. Jacqlyn Krauss in [place]- both knees replaced 08, 09.      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 fascicular block) 11/07/2017   Sleep apnea    Sleep apnea with use of continuous positive airway pressure (CPAP) 10/08/2016   Bi-PAP nitely- been 20+ yrs now   Spinal stenosis, lumbar region, with neurogenic claudication 10/08/2016   Back specialist in [place]-    only surgeon he would use- pt declined referral here for back pain mgt     Stroke [facility])    Past Surgical History:  Procedure Laterality Date   AMPUTATION TOE Bilateral 03/03/2022   Procedure: BILATERAL 2ND DIGIT AMPUTATION;  Surgeon: Candelaria Stagers, DPM;  Location: [facility] ORS;  Service: Podiatry;  Laterality: Bilateral;   EYE SURGERY     HAMMER TOE SURGERY Right 09/28/2021   Middle toe   I & D EXTREMITY Right 10/30/2019   Procedure: IRRIGATION AND DEBRIDEMENT EXTREMITY, right leg possible wound vac placement;  Surgeon: Teryl Lucy, MD;  Location: [facility] OR;  Service: Orthopedics;  Laterality: Right;   JOINT REPLACEMENT     REPLACEMENT TOTAL KNEE Bilateral    TEE WITHOUT CARDIOVERSION N/A 03/05/2022   Procedure: TRANSESOPHAGEAL ECHOCARDIOGRAM (TEE);  Surgeon: Antonieta Iba, MD;  Location: [facility] ORS;  Service: Cardiovascular;  Laterality: N/A;   TIBIA FRACTURE SURGERY     VITRECTOMY PARS PLANA 23 GAUGE FOR ENDOPHTHALMITIS Left 03/04/2020    Procedure: VITRECTOMY PARS PLANA 23 GAUGE FOR ENDOPHTHALMITIS;  Surgeon: Carmela Rima, MD;  Location: [facility] OR;  Service: Ophthalmology;  Laterality: Left;   Family History  Problem Relation Age of Onset   Congestive Heart Failure Mother    Hypertension Mother    Diabetes Mother    Arthritis Mother    Cancer Father        lung   Congestive Heart Failure Sister    Hypertension Sister    Hypertension Brother    Hypertension Sister    Obesity Sister    Diabetes Brother    Social History   Socioeconomic History   Marital status: Married    Spouse name: Not on file   Number of children: Not on file   Years of education: Not on file   Highest education level: Associate degree: occupational, Scientist, product/process development, or vocational program  Occupational History   Not on  file  Tobacco Use   Smoking status: Never    Passive exposure: Never   Smokeless tobacco: Never  Vaping Use   Vaping status: Never Used  Substance and Sexual Activity   Alcohol use: Not Currently   Drug use: No   Sexual activity: Not on file  Other Topics Concern   Not on file  Social History Narrative   Not on file   Social Drivers of Health   Financial Resource Strain: Low Risk  (01/22/2024)   Overall Financial Resource Strain (CARDIA)    Difficulty of Paying Living Expenses: Not hard at all  Food Insecurity: No Food Insecurity (01/22/2024)   Hunger Vital Sign    Worried About Running Out of Food in the Last Year: Never true    Ran Out of Food in the Last Year: Never true  Transportation Needs: No Transportation Needs (01/22/2024)   PRAPARE - Administrator, Civil Service (Medical): No    Lack of Transportation (Non-Medical): No  Physical Activity: Insufficiently Active (01/22/2024)   Exercise Vital Sign    Days of Exercise per Week: 1 day    Minutes of Exercise per Session: 10 min  Stress: No Stress Concern Present (01/22/2024)   [employer] of Occupational Health - Occupational Stress Questionnaire     Feeling of Stress : Not at all  Social Connections: Unknown (01/22/2024)   Social Connection and Isolation Panel [NHANES]    Frequency of Communication with Friends and Family: More than three times a week    Frequency of Social Gatherings with Friends and Family: Once a week    Attends Religious Services: Not on Marketing executive or Organizations: No    Attends Engineer, structural: Not on file    Marital Status: Married    Tobacco Counseling Counseling given: Not Answered    Clinical Intake:  Pre-visit preparation completed: Yes  Pain : 0-10 Pain Score: 4  Pain Type: Acute pain Pain Location: Generalized Pain Descriptors / Indicators: Sore Pain Onset: Yesterday Pain Frequency: Constant     Nutritional Risks: None Diabetes: No  Lab Results  Component Value Date   HGBA1C 5.7 (H) 09/04/2022   HGBA1C 5.4 09/05/2021   HGBA1C 5.7 (H) 10/16/2020     How often do you need to have someone help you when you read instructions, pamphlets, or other written materials from your doctor or pharmacy?: 1 - Never  Interpreter Needed?: No  Information entered by :: NAllen LPN   Activities of Daily Living     01/22/2024    2:10 PM  In your present state of health, do you have any difficulty performing the following activities:  Hearing? 0  Vision? 1  Comment trouble with one eye  Difficulty concentrating or making decisions? 0  Walking or climbing stairs? 0  Dressing or bathing? 0  Doing errands, shopping? 0  Preparing Food and eating ? N  Using the Toilet? N  In the past six months, have you accidently leaked urine? Y  Do you have problems with loss of bowel control? N  Managing your Medications? N  Managing your Finances? N  Housekeeping or managing your Housekeeping? N    Patient Care Team: Sandre Kitty, MD as PCP - General (Family Medicine) Dulce Sellar Iline Oven, MD as PCP - Cardiology (Cardiology) Lenor Coffin., MD as Physician  Assistant (Pain Medicine) Baldo Daub, MD as Consulting Physician (Cardiology) Felecia Shelling, DPM as Consulting Physician (  Podiatry) Lynn Ito, MD as Consulting Physician (Infectious Diseases)  Indicate any recent Medical Services you may have received from other than [facility] providers in the past year (date may be approximate).     Assessment:   This is a routine wellness examination for Joel Dominguez.  Hearing/Vision screen Hearing Screening - Comments:: Denies hearing issues Vision Screening - Comments:: Regular eye exams, [facility]   Goals Addressed             This Visit's Progress    Patient Stated       01/22/2024, wants to lose more weight       Depression Screen     01/22/2024    2:15 PM 01/21/2023    1:27 PM 09/04/2022   10:05 AM 03/26/2022    9:19 AM 03/20/2022   10:57 AM 02/18/2022    3:09 PM 08/21/2021   10:36 AM  PHQ 2/9 Scores  PHQ - 2 Score 0 0 0 0 0 0 0  PHQ- 9 Score 4  3 0 0 3 3    Fall Risk     01/22/2024    2:14 PM 01/21/2023    1:42 PM 01/21/2023   12:22 AM 03/26/2022    9:20 AM 02/18/2022    3:09 PM  Fall Risk   Falls in the past year? 0 0 0 0 0  Number falls in past yr: 0 0 0  0  Injury with Fall? 0 0 0  0  Risk for fall due to : Medication side effect Impaired mobility  No Fall Risks No Fall Risks  Follow up Falls prevention discussed;Falls evaluation completed   Falls evaluation completed Falls evaluation completed    MEDICARE RISK AT HOME:  Medicare Risk at Home Any stairs in or around the home?: Yes If so, are there any without handrails?: No Home free of loose throw rugs in walkways, pet beds, electrical cords, etc?: Yes Adequate lighting in your home to reduce risk of falls?: Yes Life alert?: No Use of a cane, walker or w/c?: No Grab bars in the bathroom?: Yes Shower chair or bench in shower?: Yes Elevated toilet seat or a handicapped toilet?: Yes  TIMED UP AND GO:  Was the test performed?  No  Cognitive Function:  6CIT completed        01/22/2024    2:16 PM 01/21/2023    3:09 PM 11/21/2021   10:25 AM 10/19/2020   10:15 AM 03/09/2019    1:21 PM  6CIT Screen  What Year? 0 points 0 points 0 points 0 points 0 points  What month? 0 points 0 points 0 points 0 points 0 points  What time? 0 points 0 points 0 points 0 points 0 points  Count back from 20 0 points 0 points 0 points 0 points 0 points  Months in reverse 0 points 0 points 0 points 0 points 0 points  Repeat phrase 0 points 0 points 0 points 0 points 4 points  Total Score 0 points 0 points 0 points 0 points 4 points    Immunizations Immunization History  Administered Date(s) Administered   Fluad Quad(high Dose 65+) 08/05/2019, 09/07/2020, 08/21/2021, 09/04/2022   Fluad Trivalent(High Dose 65+) 09/19/2023   Influenza, High Dose Seasonal PF 10/08/2016, 09/18/2018   Influenza-Unspecified 09/18/2018   Moderna Sars-Covid-2 Vaccination 12/18/2019, 01/15/2020   Pneumococcal Conjugate-13 05/13/2014   Pneumococcal Polysaccharide-23 03/11/2007, 08/04/2010    Screening Tests Health Maintenance  Topic Date Due   Hepatitis C Screening  Never done  DTaP/Tdap/Td (1 - Tdap) Never done   Zoster Vaccines- Shingrix (1 of 2) Never done   Pneumonia Vaccine 94+ Years old (3 of 3 - PPSV23 or PCV20) 05/06/2016   COVID-19 Vaccine (3 - Moderna risk series) 02/12/2020   Medicare Annual Wellness (AWV)  01/21/2025   Colonoscopy  09/22/2033   INFLUENZA VACCINE  Completed   HPV VACCINES  Aged Out    Health Maintenance  Health Maintenance Due  Topic Date Due   Hepatitis C Screening  Never done   DTaP/Tdap/Td (1 - Tdap) Never done   Zoster Vaccines- Shingrix (1 of 2) Never done   Pneumonia Vaccine 52+ Years old (3 of 3 - PPSV23 or PCV20) 05/06/2016   COVID-19 Vaccine (3 - Moderna risk series) 02/12/2020   Health Maintenance Items Addressed: Declines shingrix. Due for pneumonia  Additional Screening:  Vision Screening: Recommended annual ophthalmology  exams for early detection of glaucoma and other disorders of the eye.  Dental Screening: Recommended annual dental exams for proper oral hygiene  Community Resource Referral / Chronic Care Management: CRR required this visit?  No   CCM required this visit?  No     Plan:     I have personally reviewed and noted the following in the patient's chart:   Medical and social history Use of alcohol, tobacco or illicit drugs  Current medications and supplements including opioid prescriptions. Patient is not currently taking opioid prescriptions. Functional ability and status Nutritional status Physical activity Advanced directives List of other physicians Hospitalizations, surgeries, and ER visits in previous 12 months Vitals Screenings to include cognitive, depression, and falls Referrals and appointments  In addition, I have reviewed and discussed with patient certain preventive protocols, quality metrics, and best practice recommendations. A written personalized care plan for preventive services as well as general preventive health recommendations were provided to patient.     Barb Merino, LPN   1/61/0960   After Visit Summary: (MyChart) Due to this being a telephonic visit, the after visit summary with patients personalized plan was offered to patient via MyChart   Notes: Nothing significant to report at this time.

## 2024-02-13 DIAGNOSIS — H35361 Drusen (degenerative) of macula, right eye: Secondary | ICD-10-CM | POA: Diagnosis not present

## 2024-02-13 DIAGNOSIS — H2511 Age-related nuclear cataract, right eye: Secondary | ICD-10-CM | POA: Diagnosis not present

## 2024-02-13 DIAGNOSIS — H21542 Posterior synechiae (iris), left eye: Secondary | ICD-10-CM | POA: Diagnosis not present

## 2024-02-13 DIAGNOSIS — H35031 Hypertensive retinopathy, right eye: Secondary | ICD-10-CM | POA: Diagnosis not present

## 2024-02-23 ENCOUNTER — Ambulatory Visit (INDEPENDENT_AMBULATORY_CARE_PROVIDER_SITE_OTHER): Admitting: Family Medicine

## 2024-02-23 ENCOUNTER — Encounter: Payer: Self-pay | Admitting: Family Medicine

## 2024-02-23 VITALS — BP 147/73 | HR 77 | Ht 72.0 in | Wt 260.0 lb

## 2024-02-23 DIAGNOSIS — R051 Acute cough: Secondary | ICD-10-CM

## 2024-02-23 DIAGNOSIS — I482 Chronic atrial fibrillation, unspecified: Secondary | ICD-10-CM

## 2024-02-23 DIAGNOSIS — R7303 Prediabetes: Secondary | ICD-10-CM | POA: Diagnosis not present

## 2024-02-23 DIAGNOSIS — L97509 Non-pressure chronic ulcer of other part of unspecified foot with unspecified severity: Secondary | ICD-10-CM

## 2024-02-23 DIAGNOSIS — G4733 Obstructive sleep apnea (adult) (pediatric): Secondary | ICD-10-CM | POA: Diagnosis not present

## 2024-02-23 DIAGNOSIS — I25119 Atherosclerotic heart disease of native coronary artery with unspecified angina pectoris: Secondary | ICD-10-CM | POA: Diagnosis not present

## 2024-02-23 DIAGNOSIS — I272 Pulmonary hypertension, unspecified: Secondary | ICD-10-CM

## 2024-02-23 HISTORY — DX: Acute cough: R05.1

## 2024-02-23 NOTE — Progress Notes (Signed)
 [facility]  Established Patient Office Visit  Subjective   Patient ID: Joel Dominguez, male    DOB: May 22, 1951  Age: 73 y.o. MRN: [id number]  Chief Complaint  Patient presents with   Cough    HPI  patient has not been seen for an appointment since November 2023.  States he is just here for checkup but also has been "hacking up green stuff" for the past week.  Patient states he has allergies and thinks that is what this is.  Takes Claritin does not take nasal sprays.  No fever.  No dyspnea on exertion.  He has been out playing patio pavers around his pool the past week.  Patient has an appointment with his cardiologist, Dr. Sandee Crook, in a few months.  No changes to his medications.  Patient has a right toe ulcer and has had some amputations on the toes due to infections in the past.  Patient still wears his CPAP.  No issues with this.   The ASCVD Risk score (Arnett DK, et al., 2019) failed to calculate for the following reasons:   Risk score cannot be calculated because patient has a medical history suggesting prior/existing ASCVD  Health Maintenance Due  Topic Date Due   Hepatitis C Screening  Never done   DTaP/Tdap/Td (1 - Tdap) Never done   COVID-19 Vaccine (3 - Moderna risk series) 02/12/2020      Objective:     BP (!) 147/73   Pulse 77   Ht 6' (1.829 m)   Wt 260 lb (117.9 kg)   SpO2 98%   BMI 35.26 kg/m    Physical Exam General: Alert, oriented CV: Irregularly irregular rhythm.  No murmurs Pulmonary: Lungs clear bilaterally no wheeze or crackles.  No respiratory distress.  No tachypnea.   No results found for any visits on 02/23/24.      Assessment & Plan:   Chronic atrial fibrillation [facility]) Assessment & Plan: Sees cardiology, Dr. Sandee Crook.  Takes carvedilol  and Eliquis .  Continue.  Getting CBC and CMP today.  Orders: -     Comprehensive metabolic panel with GFR; Future -     CBC; Future  Pulmonary hypertension (HCC) -     Comprehensive metabolic panel  with GFR; Future  OSA (obstructive sleep apnea)  Prediabetes -     Hemoglobin A1c; Future  Atherosclerosis of native coronary artery with angina pectoris, unspecified whether native or transplanted heart (HCC) -     Lipid panel; Future  Chronic foot ulcer, unspecified laterality, unspecified ulcer stage [facility]) Assessment & Plan: Routinely follows with podiatry.  Continue current management.   Morbid (severe) obesity due to excess calories [facility]) Assessment & Plan: Patient has lost a significant amount of weight over the past 2 years, approximately 40 pounds, with diet and exercise.  Continue to encourage weight loss.   Acute cough Assessment & Plan: 1 week of productive cough.  Patient has allergies and has been outside more frequently recently.  No shortness of breath, no dyspnea, no fevers, O2 sat is normal.  Not concern for bacterial infection at this time.  Advised patient if he gets worse to let us  know.      Return in about 6 months (around 08/24/2024) for physical.    Laneta Pintos, MD

## 2024-02-23 NOTE — Patient Instructions (Signed)
 It was nice to see you today,  We addressed the following topics today: I do not think your cough is due to a bacterial infection.  If you feel like you are getting fever or if your O2 level decreases below its normal range let us  know - Otherwise continue taking your allergy medicine - Schedule a lab visit with us  so we can get your yearly labs.  Have a great day,  Etha Henle, MD

## 2024-02-23 NOTE — Assessment & Plan Note (Signed)
 Patient has lost a significant amount of weight over the past 2 years, approximately 40 pounds, with diet and exercise.  Continue to encourage weight loss.

## 2024-02-23 NOTE — Assessment & Plan Note (Signed)
 Sees cardiology, Dr. Sandee Crook.  Takes carvedilol  and Eliquis .  Continue.  Getting CBC and CMP today.

## 2024-02-23 NOTE — Assessment & Plan Note (Signed)
 Routinely follows with podiatry.  Continue current management.

## 2024-02-23 NOTE — Assessment & Plan Note (Signed)
 1 week of productive cough.  Patient has allergies and has been outside more frequently recently.  No shortness of breath, no dyspnea, no fevers, O2 sat is normal.  Not concern for bacterial infection at this time.  Advised patient if he gets worse to let us  know.

## 2024-02-26 DIAGNOSIS — I272 Pulmonary hypertension, unspecified: Secondary | ICD-10-CM | POA: Diagnosis not present

## 2024-02-26 DIAGNOSIS — I482 Chronic atrial fibrillation, unspecified: Secondary | ICD-10-CM | POA: Diagnosis not present

## 2024-02-26 DIAGNOSIS — I25119 Atherosclerotic heart disease of native coronary artery with unspecified angina pectoris: Secondary | ICD-10-CM | POA: Diagnosis not present

## 2024-02-26 DIAGNOSIS — R7303 Prediabetes: Secondary | ICD-10-CM | POA: Diagnosis not present

## 2024-02-27 ENCOUNTER — Other Ambulatory Visit

## 2024-02-27 DIAGNOSIS — I272 Pulmonary hypertension, unspecified: Secondary | ICD-10-CM

## 2024-02-27 DIAGNOSIS — R7303 Prediabetes: Secondary | ICD-10-CM

## 2024-02-27 DIAGNOSIS — I25119 Atherosclerotic heart disease of native coronary artery with unspecified angina pectoris: Secondary | ICD-10-CM

## 2024-02-27 DIAGNOSIS — I482 Chronic atrial fibrillation, unspecified: Secondary | ICD-10-CM

## 2024-02-28 LAB — LIPID PANEL
Chol/HDL Ratio: 3.5 ratio (ref 0.0–5.0)
Cholesterol, Total: 145 mg/dL (ref 100–199)
HDL: 42 mg/dL (ref >39)
LDL Chol Calc (NIH): 84 mg/dL (ref 0–99)
Triglycerides: 100 mg/dL (ref 0–149)
VLDL Cholesterol Cal: 19 mg/dL (ref 5–40)

## 2024-02-28 LAB — COMPREHENSIVE METABOLIC PANEL WITH GFR
ALT: 14 IU/L (ref 0–44)
AST: 18 IU/L (ref 0–40)
Albumin: 4 g/dL (ref 3.8–4.8)
Alkaline Phosphatase: 105 IU/L (ref 44–121)
BUN/Creatinine Ratio: 19 (ref 10–24)
BUN: 23 mg/dL (ref 8–27)
Bilirubin Total: 0.4 mg/dL (ref 0.0–1.2)
CO2: 26 mmol/L (ref 20–29)
Calcium: 9 mg/dL (ref 8.6–10.2)
Chloride: 98 mmol/L (ref 96–106)
Creatinine, Ser: 1.22 mg/dL (ref 0.76–1.27)
Globulin, Total: 3.2 g/dL (ref 1.5–4.5)
Glucose: 99 mg/dL (ref 70–99)
Potassium: 4.6 mmol/L (ref 3.5–5.2)
Sodium: 138 mmol/L (ref 134–144)
Total Protein: 7.2 g/dL (ref 6.0–8.5)
eGFR: 63 mL/min/{1.73_m2} (ref >59)

## 2024-02-28 LAB — HEMOGLOBIN A1C
Est. average glucose Bld gHb Est-mCnc: 117 mg/dL
Hgb A1c MFr Bld: 5.7 % — ABNORMAL HIGH (ref 4.8–5.6)

## 2024-02-28 LAB — CBC
Hematocrit: 41.4 % (ref 37.5–51.0)
Hemoglobin: 13.9 g/dL (ref 13.0–17.7)
MCH: 30.3 pg (ref 26.6–33.0)
MCHC: 33.6 g/dL (ref 31.5–35.7)
MCV: 90 fL (ref 79–97)
Platelets: 280 10*3/uL (ref 150–450)
RBC: 4.58 x10E6/uL (ref 4.14–5.80)
RDW: 12.8 % (ref 11.6–15.4)
WBC: 7 10*3/uL (ref 3.4–10.8)

## 2024-03-01 ENCOUNTER — Encounter: Payer: Self-pay | Admitting: Family Medicine

## 2024-03-16 DIAGNOSIS — G894 Chronic pain syndrome: Secondary | ICD-10-CM | POA: Diagnosis not present

## 2024-03-16 DIAGNOSIS — G8929 Other chronic pain: Secondary | ICD-10-CM | POA: Diagnosis not present

## 2024-03-16 DIAGNOSIS — M25562 Pain in left knee: Secondary | ICD-10-CM | POA: Diagnosis not present

## 2024-03-16 DIAGNOSIS — M25561 Pain in right knee: Secondary | ICD-10-CM | POA: Diagnosis not present

## 2024-03-16 DIAGNOSIS — M5441 Lumbago with sciatica, right side: Secondary | ICD-10-CM | POA: Diagnosis not present

## 2024-03-16 DIAGNOSIS — Z79899 Other long term (current) drug therapy: Secondary | ICD-10-CM | POA: Diagnosis not present

## 2024-03-25 ENCOUNTER — Telehealth: Payer: Self-pay | Admitting: *Deleted

## 2024-03-25 NOTE — Telephone Encounter (Signed)
 Copied from CRM [id number]. Topic: Clinical - Order For Equipment >> Mar 25, 2024  1:18 PM Sasha H wrote: Reason for CRM: [facility] is requesting face to face notes, sleep study, and most recent compliance for CPAP machine supplies. Fax number is [phone]

## 2024-04-01 ENCOUNTER — Telehealth: Payer: Self-pay

## 2024-04-01 NOTE — Telephone Encounter (Signed)
 His sleep study seems to be from 20+ years ago so those notes aren't in here.  My recent note barely mentions the cpap, but if we call him to ask him what settings he is using I can addend my note to document it.  We can see if he knows where his sleep study was performed or if he has the results

## 2024-04-01 NOTE — Telephone Encounter (Signed)
 Copied from CRM [id number]. Topic: General - Other >> Apr 01, 2024 10:57 AM Hobson Luna F wrote: Reason for CRM: [facility] is calling in because they need face-to-face notes from patient's last visit so they can dispense his CPAP supplies. Fax: [phone], CB: [phone]    Printed out notes and faxed 04/01/2024

## 2024-04-02 NOTE — Telephone Encounter (Signed)
 Faxed over the requested information after confirming with patient that he was using this company for his CPAP supplies.

## 2024-04-02 NOTE — Assessment & Plan Note (Signed)
 The patient is compliant with his CPAP machine.  Wears it nightly.  Most recent settings available in his chart include BiPAP mode with IPAP of 12 cm H2O, EPAP of 9 cm H2O.

## 2024-04-20 ENCOUNTER — Ambulatory Visit: Admitting: Cardiology

## 2024-04-30 ENCOUNTER — Encounter: Payer: Self-pay | Admitting: Cardiology

## 2024-05-04 ENCOUNTER — Other Ambulatory Visit: Payer: Self-pay

## 2024-06-04 ENCOUNTER — Ambulatory Visit: Admitting: Cardiology

## 2024-06-09 DIAGNOSIS — H269 Unspecified cataract: Secondary | ICD-10-CM | POA: Insufficient documentation

## 2024-06-09 DIAGNOSIS — I509 Heart failure, unspecified: Secondary | ICD-10-CM | POA: Insufficient documentation

## 2024-06-09 DIAGNOSIS — M5441 Lumbago with sciatica, right side: Secondary | ICD-10-CM | POA: Diagnosis not present

## 2024-06-09 DIAGNOSIS — T7840XA Allergy, unspecified, initial encounter: Secondary | ICD-10-CM | POA: Insufficient documentation

## 2024-06-09 DIAGNOSIS — Z79899 Other long term (current) drug therapy: Secondary | ICD-10-CM | POA: Diagnosis not present

## 2024-06-09 DIAGNOSIS — G8929 Other chronic pain: Secondary | ICD-10-CM | POA: Diagnosis not present

## 2024-06-10 ENCOUNTER — Encounter: Payer: Self-pay | Admitting: Cardiology

## 2024-06-10 ENCOUNTER — Ambulatory Visit: Attending: Cardiology | Admitting: Cardiology

## 2024-06-10 VITALS — BP 132/78 | HR 84 | Ht 72.0 in | Wt 259.0 lb

## 2024-06-10 DIAGNOSIS — G4733 Obstructive sleep apnea (adult) (pediatric): Secondary | ICD-10-CM

## 2024-06-10 DIAGNOSIS — I4821 Permanent atrial fibrillation: Secondary | ICD-10-CM | POA: Insufficient documentation

## 2024-06-10 DIAGNOSIS — I110 Hypertensive heart disease with heart failure: Secondary | ICD-10-CM | POA: Diagnosis not present

## 2024-06-10 NOTE — Patient Instructions (Signed)
Medication Instructions:  Your physician recommends that you continue on your current medications as directed. Please refer to the Current Medication list given to you today.  *If you need a refill on your cardiac medications before your next appointment, please call your pharmacy*   Lab Work: None ordered If you have labs (blood work) drawn today and your tests are completely normal, you will receive your results only by: MyChart Message (if you have MyChart) OR A paper copy in the mail If you have any lab test that is abnormal or we need to change your treatment, we will call you to review the results.   Testing/Procedures: None ordered   Follow-Up: At [facility], you and your health needs are our priority.  As part of our continuing mission to provide you with exceptional heart care, we have created designated Provider Care Teams.  These Care Teams include your primary Cardiologist (physician) and Advanced Practice Providers (APPs -  Physician Assistants and Nurse Practitioners) who all work together to provide you with the care you need, when you need it.  We recommend signing up for the patient portal called "MyChart".  Sign up information is provided on this After Visit Summary.  MyChart is used to connect with patients for Virtual Visits (Telemedicine).  Patients are able to view lab/test results, encounter notes, upcoming appointments, etc.  Non-urgent messages can be sent to your provider as well.   To learn more about what you can do with MyChart, go to [email or web address].    Your next appointment:   9 month(s)  The format for your next appointment:   In Person  Provider:   Norman Herrlich, MD    Other Instructions none  Important Information About Sugar

## 2024-06-10 NOTE — Progress Notes (Signed)
 Cardiology Office Note:    Date:  06/10/2024   ID:  Joel Dominguez, DOB 09/02/1951, MRN [id number]  PCP:  Chandra Toribio POUR, MD  Cardiologist:  Jennifer JONELLE Crape, MD   Referring MD: Chandra Toribio POUR, MD    ASSESSMENT:    1. Hypertensive heart disease with heart failure (HCC)   2. Permanent atrial fibrillation (HCC)   3. OSA (obstructive sleep apnea)    PLAN:    In order of problems listed above:  Primary prevention stressed with the patient.  Importance of compliance with diet medication stressed and patient verbalized standing. Essential hypertension: Blood pressure is stable and diet was emphasized.  Lifestyle modification urged. Permanent atrial fibrillation:I discussed with the patient atrial fibrillation, disease process. Management and therapy including rate and rhythm control, anticoagulation benefits and potential risks were discussed extensively with the patient. Patient had multiple questions which were answered to patient's satisfaction. Sleep apnea: Sleep health issues were discussed. Obesity: Weight reduction stressed diet emphasized and he promises to do better. Patient will be seen in follow-up appointment in 6 months or earlier if the patient has any concerns.    Medication Adjustments/Labs and Tests Ordered: Current medicines are reviewed at length with the patient today.  Concerns regarding medicines are outlined above.  Orders Placed This Encounter  Procedures   EKG 12-Lead   No orders of the defined types were placed in this encounter.    No chief complaint on file.    History of Present Illness:    Joel Dominguez is a 73 y.o. male.  Patient has past medical history of permanent atrial fibrillation, obstructive sleep apnea and essential hypertension.  He denies any problems at this time and takes care of activities of daily living.  He is obese and leads a sedentary lifestyle.  At the time of my evaluation, the patient is alert awake oriented and in no  distress.  Past Medical History:  Diagnosis Date   A-fib 10/08/2016   Acute cough 02/23/2024   Allergy    Aortic stenosis 11/06/2017   Atherosclerotic heart disease of native coronary artery with unspecified angina pectoris (HCC) 10/08/2016   Cataract    CHF (congestive heart failure) (HCC)    Chronic atrial fibrillation (HCC) 10/08/2016   Chronic combined systolic and diastolic CHF (congestive heart failure) (HCC) 10/08/2016   EF range 35-45% in 2016     Chronic combined systolic and diastolic heart failure (HCC) 10/08/2016   EF range 35-45% in 2016   Chronic congestive heart failure (HCC)    Chronic foot ulcer (HCC) 08/04/2019   Chronic ulcer of toe (HCC) 08/04/2019   Coagulation disorder (HCC) 04/30/2019   Foot drop- R 10/08/2016   Patient known foot drop for many years due to lumbar spinal stenosis.-Over the past couple of months symptoms have gotten worse.  He feels he is tripping more and cannot lift his foot up as much. Toes are curling and calluses on end of toes.    - Did see spinal surgeon many yrs ago.  Declined sx at that time.      Generalized OA 10/08/2016   Ortho doc-  Dr. Bette Homestead in [place]- both knees replaced 08, 09.      Heart disease    Hypertensive heart disease with heart failure (HCC) 11/10/2017   Left leg weakness 08/08/2019   Longstanding persistent atrial fibrillation [facility])    Lumbar spinal stenosis 10/08/2016   Back specialist in [place]-    only surgeon he would use- pt  declined referral here for back pain mgt           Lumbosacral radiculopathy 01/22/2018   Added automatically from request for surgery [id number]     ---> sees pain mgt for this     Formatting of this note might be different from the original.  Added automatically from request for surgery [id number]     Morbid (severe) obesity due to excess calories (HCC) 03/03/2022   Obesity, Class III, BMI 40-49.9 (morbid obesity) 10/08/2016   OSA (obstructive sleep apnea) 10/08/2016   Bi-PAP nitely- been 20+  yrs now   Osteoarthritis 11/19/2017   Positive for microalbuminuria 11/19/2017   Prediabetes 08/08/2019   Presence of other vascular implants and grafts 03/12/2022   Pulmonary hypertension (HCC) 10/08/2016   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 fascicular block) 11/07/2017   Sleep apnea with use of continuous positive airway pressure (CPAP) 10/08/2016   Bi-PAP nitely- been 20+ yrs now   Spinal stenosis, lumbar region, with neurogenic claudication 10/08/2016   Back specialist in [place]-    only surgeon he would use- pt declined referral here for back pain mgt     Stroke (HCC)    Vitamin D  deficiency 11/19/2017    Past Surgical History:  Procedure Laterality Date   AMPUTATION TOE Bilateral 03/03/2022   Procedure: BILATERAL 2ND DIGIT AMPUTATION;  Surgeon: Tobie Franky SQUIBB, DPM;  Location: [facility] ORS;  Service: Podiatry;  Laterality: Bilateral;   EYE SURGERY     HAMMER TOE SURGERY Right 09/28/2021   Middle toe   I & D EXTREMITY Right 10/30/2019   Procedure: IRRIGATION AND DEBRIDEMENT EXTREMITY, right leg possible wound vac placement;  Surgeon: Josefina Chew, MD;  Location: [facility] OR;  Service: Orthopedics;  Laterality: Right;   JOINT REPLACEMENT     REPLACEMENT TOTAL KNEE Bilateral    TEE WITHOUT CARDIOVERSION N/A 03/05/2022   Procedure: TRANSESOPHAGEAL ECHOCARDIOGRAM (TEE);  Surgeon: Perla Evalene PARAS, MD;  Location: [facility] ORS;  Service: Cardiovascular;  Laterality: N/A;   TIBIA FRACTURE SURGERY     VITRECTOMY PARS PLANA 23 GAUGE FOR ENDOPHTHALMITIS Left 03/04/2020   Procedure: VITRECTOMY PARS PLANA 23 GAUGE FOR ENDOPHTHALMITIS;  Surgeon: Tobie Baptist, MD;  Location: [facility] OR;  Service: Ophthalmology;  Laterality: Left;    Current Medications: Current Meds  Medication Sig   B Complex-C (B-COMPLEX WITH VITAMIN C) tablet Take 1 tablet by mouth daily.   bumetanide  (BUMEX ) 1 MG tablet TAKE 1 TABLET BY MOUTH ONCE DAILY. TAKE AN ADDITIONAL TABLET ON MONDAY, WEDNESDAY, AND FRIDAY.    carvedilol  (COREG ) 12.5 MG tablet TAKE 1 TABLET BY MOUTH TWICE A DAY WITH A MEAL   ELIQUIS  5 MG TABS tablet TAKE 1 TABLET BY MOUTH TWICE  DAILY   gentamicin  cream (GARAMYCIN ) 0.1 % Apply 1 Application topically 2 (two) times daily.   hydrocerin (EUCERIN) CREA Apply 1 application topically 2 (two) times daily.   Multiple Vitamin (MULTIVITAMIN) tablet Take 1 tablet by mouth daily.   spironolactone  (ALDACTONE ) 25 MG tablet TAKE 1 TABLET BY MOUTH DAILY     Allergies:   Cephalexin and Dilaudid  [hydromorphone ]   Social History   Socioeconomic History   Marital status: Married    Spouse name: Not on file   Number of children: Not on file   Years of education: Not on file   Highest education level: Associate degree: occupational, Scientist, product/process development, or vocational program  Occupational History   Not on file  Tobacco Use   Smoking status: Never  Passive exposure: Never   Smokeless tobacco: Never  Vaping Use   Vaping status: Never Used  Substance and Sexual Activity   Alcohol use: Not Currently   Drug use: No   Sexual activity: Not on file  Other Topics Concern   Not on file  Social History Narrative   Not on file   Social Drivers of Health   Financial Resource Strain: Low Risk  (03/15/2024)   Received from [facility]   Overall Financial Resource Strain (CARDIA)    Difficulty of Paying Living Expenses: Not very hard  Food Insecurity: No Food Insecurity (03/15/2024)   Received from [facility]   Hunger Vital Sign    Within the past 12 months, you worried that your food would run out before you got the money to buy more.: Never true    Within the past 12 months, the food you bought just didn't last and you didn't have money to get more.: Never true  Transportation Needs: No Transportation Needs (03/15/2024)   Received from [facility] - Transportation    Lack of Transportation (Medical): No    Lack of Transportation (Non-Medical): No  Physical Activity: Insufficiently  Active (03/15/2024)   Received from [facility]   Exercise Vital Sign    On average, how many days per week do you engage in moderate to strenuous exercise (like a brisk walk)?: 3 days    On average, how many minutes do you engage in exercise at this level?: 20 min  Stress: No Stress Concern Present (03/15/2024)   Received from [facility] - Occupational Stress Questionnaire    Feeling of Stress : Not at all  Social Connections: Moderately Integrated (03/15/2024)   Received from [facility]   Social Network    How would you rate your social network (family, work, friends)?: Adequate participation with social networks     Family History: The patient's family history includes Arthritis in his mother; Cancer in his father; Congestive Heart Failure in his mother and sister; Diabetes in his brother and mother; Hypertension in his brother, mother, sister, and sister; Obesity in his sister.  ROS:   Please see the history of present illness.    All other systems reviewed and are negative.  EKGs/Labs/Other Studies Reviewed:    The following studies were reviewed today: .SABRAEKG Interpretation Date/Time:  Thursday June 10 2024 11:30:21 EDT Ventricular Rate:  84 PR Interval:    QRS Duration:  170 QT Interval:  412 QTC Calculation: 486 R Axis:   -46  Text Interpretation: Atrial fibrillation with premature ventricular or aberrantly conducted complexes Right bundle branch block Left anterior fascicular block Bifascicular block Left ventricular hypertrophy Cannot rule out Septal infarct , age undetermined Abnormal ECG When compared with ECG of 24-Oct-2019 13:07, PREVIOUS ECG IS PRESENT Confirmed by Edwyna Backers [id number]) on 06/10/2024 11:37:48 AM     Recent Labs: 02/26/2024: ALT 14; BUN 23; Creatinine, Ser 1.22; Hemoglobin 13.9; Platelets 280; Potassium 4.6; Sodium 138  Recent Lipid Panel    Component Value Date/Time   CHOL 145 02/26/2024 1405    TRIG 100 02/26/2024 1405   HDL 42 02/26/2024 1405   CHOLHDL 3.5 02/26/2024 1405   CHOLHDL 4.3 10/08/2016 1144   VLDL 45 (H) 10/08/2016 1144   LDLCALC 84 02/26/2024 1405    Physical Exam:    VS:  BP 132/78   Pulse 84   Ht 6' (1.829 m)   Wt 259 lb (  117.5 kg)   SpO2 97%   BMI 35.13 kg/m     Wt Readings from Last 3 Encounters:  06/10/24 259 lb (117.5 kg)  02/23/24 260 lb (117.9 kg)  11/18/23 257 lb (116.6 kg)     GEN: Patient is in no acute distress HEENT: Normal NECK: No JVD; No carotid bruits LYMPHATICS: No lymphadenopathy CARDIAC: Hear sounds regular, 2/6 systolic murmur at the apex. RESPIRATORY:  Clear to auscultation without rales, wheezing or rhonchi  ABDOMEN: Soft, non-tender, non-distended MUSCULOSKELETAL:  No edema; No deformity  SKIN: Warm and dry NEUROLOGIC:  Alert and oriented x 3 PSYCHIATRIC:  Normal affect   Signed, Jennifer JONELLE Crape, MD  06/10/2024 11:39 AM    [facility]

## 2024-06-15 ENCOUNTER — Other Ambulatory Visit: Payer: Self-pay | Admitting: Cardiology

## 2024-06-15 DIAGNOSIS — I482 Chronic atrial fibrillation, unspecified: Secondary | ICD-10-CM

## 2024-06-15 NOTE — Telephone Encounter (Signed)
 Prescription refill request for Eliquis  received. Indication:AFIB Last office visit:8/25 Scr:1.22  4/25 Age: 73 Weight:117.5  kg  Prescription refilled

## 2024-06-29 DIAGNOSIS — M25562 Pain in left knee: Secondary | ICD-10-CM | POA: Diagnosis not present

## 2024-06-29 DIAGNOSIS — G8929 Other chronic pain: Secondary | ICD-10-CM | POA: Diagnosis not present

## 2024-06-29 DIAGNOSIS — M25561 Pain in right knee: Secondary | ICD-10-CM | POA: Diagnosis not present

## 2024-06-30 DIAGNOSIS — M25561 Pain in right knee: Secondary | ICD-10-CM | POA: Diagnosis not present

## 2024-06-30 DIAGNOSIS — G8929 Other chronic pain: Secondary | ICD-10-CM | POA: Diagnosis not present

## 2024-06-30 DIAGNOSIS — M25562 Pain in left knee: Secondary | ICD-10-CM | POA: Diagnosis not present

## 2024-07-01 DIAGNOSIS — M25561 Pain in right knee: Secondary | ICD-10-CM | POA: Diagnosis not present

## 2024-07-01 DIAGNOSIS — G8929 Other chronic pain: Secondary | ICD-10-CM | POA: Diagnosis not present

## 2024-07-01 DIAGNOSIS — M25562 Pain in left knee: Secondary | ICD-10-CM | POA: Diagnosis not present

## 2024-07-06 ENCOUNTER — Encounter: Payer: Self-pay | Admitting: Sports Medicine

## 2024-07-15 ENCOUNTER — Ambulatory Visit (INDEPENDENT_AMBULATORY_CARE_PROVIDER_SITE_OTHER)

## 2024-07-15 ENCOUNTER — Telehealth: Payer: Self-pay

## 2024-07-15 VITALS — BP 121/63 | HR 85 | Wt 261.0 lb

## 2024-07-15 DIAGNOSIS — H6122 Impacted cerumen, left ear: Secondary | ICD-10-CM

## 2024-07-15 NOTE — Telephone Encounter (Signed)
 Patient coming in at 3:30 for evaluation

## 2024-07-15 NOTE — Telephone Encounter (Signed)
 Copied from CRM #[id number]. Topic: Appointments - Appointment Info/Confirmation >> Jul 15, 2024  1:43 PM Emylou G wrote: Patient would like to have ear.. his left cleaned.. impacted.. can't wait for his PCP.SABRA Please call patient if we can do different PCP  Called pt per [place] patient has an appointment at 3:30 today 07/15/2024

## 2024-07-15 NOTE — Telephone Encounter (Signed)
Pt placed on schedule.

## 2024-07-15 NOTE — Progress Notes (Signed)
   Acute Office Visit  Subjective:     Patient ID: Joel Dominguez, male    DOB: Oct 31, 1951, 73 y.o.   MRN: [id number]  Chief Complaint  Patient presents with   Follow-up    patient in room #4 with his wife. Pt states he has a lot earwax that could be infecting his ear drum.    HPI  [name] is a 73 y.o. male who presents to the clinic today with his wife. Reports that he has an audiology appointment this week but unfortunately had to reschedule due to audiologist's inability to visualize TM on exam secondary to impacted ear wax in his left ear.   Reports that he has tried pouring hydrogen peroxide in his ear and using debrox drops but has not had success in dislodging the wax. Endorses muffled hearing. Denies pain. Reports he has had to have his ears flushed several times in the past due to excessive wax production.   ROS Per HPI     Objective:    BP 121/63 (BP Location: Left Arm, Patient Position: Sitting, Cuff Size: Normal)   Pulse 85   Wt 261 lb (118.4 kg)   SpO2 97%   BMI 35.40 kg/m    Physical Exam Constitutional:      General: He is not in acute distress.    Appearance: Normal appearance.  HENT:     Right Ear: Tympanic membrane normal.     Left Ear: Tympanic membrane normal. There is impacted cerumen.  Cardiovascular:     Rate and Joel: Normal rate and regular Joel.     Heart sounds: Normal heart sounds. No murmur heard.    No friction rub. No gallop.  Pulmonary:     Effort: Pulmonary effort is normal. No respiratory distress.     Breath sounds: Normal breath sounds.  Musculoskeletal:        General: No swelling.  Skin:    General: Skin is warm and dry.  Neurological:     General: No focal deficit present.     Mental Status: He is alert.  Psychiatric:        Mood and Affect: Mood normal.        Behavior: Behavior normal.        Thought Content: Thought content normal.      No results found for any visits on 07/15/24.      Assessment &  Plan:   Impacted cerumen, left ear Assessment & Plan: Performed flush of left ear with solution of 2/3 warm water and 1/3 3% hydrogen peroxide and elephant ear instrument. After flushing, an ear curette was used to dislodge wax and was subsequently removed from the ear. Total procedure time took approximately 10 minutes. Patient tolerated procedure well and TM was fully visualized with no abnormalities or trauma following the procedure.      Return if symptoms worsen or fail to improve.  Saddie JULIANNA Sacks, PA-C

## 2024-07-15 NOTE — Assessment & Plan Note (Signed)
 Performed flush of left ear with solution of 2/3 warm water and 1/3 3% hydrogen peroxide and elephant ear instrument. After flushing, an ear curette was used to dislodge wax and was subsequently removed from the ear. Total procedure time took approximately 10 minutes. Patient tolerated procedure well and TM was fully visualized with no abnormalities or trauma following the procedure.

## 2024-07-22 ENCOUNTER — Ambulatory Visit: Admitting: Family Medicine

## 2024-07-30 DIAGNOSIS — M25562 Pain in left knee: Secondary | ICD-10-CM | POA: Diagnosis not present

## 2024-07-30 DIAGNOSIS — M25561 Pain in right knee: Secondary | ICD-10-CM | POA: Diagnosis not present

## 2024-08-16 ENCOUNTER — Other Ambulatory Visit: Payer: Self-pay | Admitting: Family Medicine

## 2024-08-16 DIAGNOSIS — I509 Heart failure, unspecified: Secondary | ICD-10-CM

## 2024-08-16 DIAGNOSIS — Z1159 Encounter for screening for other viral diseases: Secondary | ICD-10-CM

## 2024-08-16 DIAGNOSIS — I482 Chronic atrial fibrillation, unspecified: Secondary | ICD-10-CM

## 2024-08-16 DIAGNOSIS — G4733 Obstructive sleep apnea (adult) (pediatric): Secondary | ICD-10-CM

## 2024-08-17 ENCOUNTER — Other Ambulatory Visit

## 2024-08-17 DIAGNOSIS — Z1159 Encounter for screening for other viral diseases: Secondary | ICD-10-CM | POA: Diagnosis not present

## 2024-08-17 DIAGNOSIS — I509 Heart failure, unspecified: Secondary | ICD-10-CM

## 2024-08-17 DIAGNOSIS — I482 Chronic atrial fibrillation, unspecified: Secondary | ICD-10-CM

## 2024-08-18 LAB — CBC WITH DIFFERENTIAL/PLATELET
Basophils Absolute: 0.1 x10E3/uL (ref 0.0–0.2)
Basos: 1 %
EOS (ABSOLUTE): 0.3 x10E3/uL (ref 0.0–0.4)
Eos: 4 %
Hematocrit: 46.2 % (ref 37.5–51.0)
Hemoglobin: 14.7 g/dL (ref 13.0–17.7)
Immature Grans (Abs): 0 x10E3/uL (ref 0.0–0.1)
Immature Granulocytes: 0 %
Lymphocytes Absolute: 2.3 x10E3/uL (ref 0.7–3.1)
Lymphs: 32 %
MCH: 28.9 pg (ref 26.6–33.0)
MCHC: 31.8 g/dL (ref 31.5–35.7)
MCV: 91 fL (ref 79–97)
Monocytes Absolute: 0.6 x10E3/uL (ref 0.1–0.9)
Monocytes: 9 %
Neutrophils Absolute: 3.9 x10E3/uL (ref 1.4–7.0)
Neutrophils: 54 %
Platelets: 275 x10E3/uL (ref 150–450)
RBC: 5.08 x10E6/uL (ref 4.14–5.80)
RDW: 12.9 % (ref 11.6–15.4)
WBC: 7.3 x10E3/uL (ref 3.4–10.8)

## 2024-08-18 LAB — COMPREHENSIVE METABOLIC PANEL WITH GFR
ALT: 11 IU/L (ref 0–44)
AST: 16 IU/L (ref 0–40)
Albumin: 4.2 g/dL (ref 3.8–4.8)
Alkaline Phosphatase: 117 IU/L (ref 47–123)
BUN/Creatinine Ratio: 23 (ref 10–24)
BUN: 26 mg/dL (ref 8–27)
Bilirubin Total: 0.3 mg/dL (ref 0.0–1.2)
CO2: 25 mmol/L (ref 20–29)
Calcium: 9.5 mg/dL (ref 8.6–10.2)
Chloride: 101 mmol/L (ref 96–106)
Creatinine, Ser: 1.12 mg/dL (ref 0.76–1.27)
Globulin, Total: 3 g/dL (ref 1.5–4.5)
Glucose: 96 mg/dL (ref 70–99)
Potassium: 4.1 mmol/L (ref 3.5–5.2)
Sodium: 138 mmol/L (ref 134–144)
Total Protein: 7.2 g/dL (ref 6.0–8.5)
eGFR: 69 mL/min/1.73 (ref >59)

## 2024-08-18 LAB — LIPID PANEL
Chol/HDL Ratio: 3.7 ratio (ref 0.0–5.0)
Cholesterol, Total: 152 mg/dL (ref 100–199)
HDL: 41 mg/dL (ref >39)
LDL Chol Calc (NIH): 76 mg/dL (ref 0–99)
Triglycerides: 209 mg/dL — ABNORMAL HIGH (ref 0–149)
VLDL Cholesterol Cal: 35 mg/dL (ref 5–40)

## 2024-08-18 LAB — HEPATITIS C ANTIBODY: Hep C Virus Ab: NONREACTIVE

## 2024-08-18 LAB — TSH: TSH: 1.2 u[IU]/mL (ref 0.450–4.500)

## 2024-08-24 ENCOUNTER — Ambulatory Visit (INDEPENDENT_AMBULATORY_CARE_PROVIDER_SITE_OTHER): Admitting: Family Medicine

## 2024-08-24 ENCOUNTER — Encounter: Payer: Self-pay | Admitting: Family Medicine

## 2024-08-24 VITALS — BP 131/75 | HR 76 | Ht 72.0 in | Wt 261.0 lb

## 2024-08-24 DIAGNOSIS — I272 Pulmonary hypertension, unspecified: Secondary | ICD-10-CM | POA: Diagnosis not present

## 2024-08-24 DIAGNOSIS — Z0000 Encounter for general adult medical examination without abnormal findings: Secondary | ICD-10-CM | POA: Diagnosis not present

## 2024-08-24 DIAGNOSIS — I5042 Chronic combined systolic (congestive) and diastolic (congestive) heart failure: Secondary | ICD-10-CM | POA: Diagnosis not present

## 2024-08-24 DIAGNOSIS — I482 Chronic atrial fibrillation, unspecified: Secondary | ICD-10-CM | POA: Diagnosis not present

## 2024-08-24 DIAGNOSIS — Z23 Encounter for immunization: Secondary | ICD-10-CM | POA: Diagnosis not present

## 2024-08-24 NOTE — Patient Instructions (Signed)
 It was nice to see you today,  We addressed the following topics today: - We have administered the pneumococcal vaccine today. - We will see you back in April.  Have a great day,  Rolan Slain, MD

## 2024-08-24 NOTE — Assessment & Plan Note (Signed)
 Patient is here for a routine follow-up. No new concerns reported. Labs from last week showed slightly elevated triglycerides, but not clinically significant to warrant medication changes, especially considering non-fasting status. Weight has been stable. Sees cardiology and pain management regularly. Blood pressure was elevated on initial check but patient took medication right before the appointment. - Pneumococcal 20 vaccine administered today. - Has appointment for influenza vaccine on 09/08/2024. - Recheck blood pressure before leaving. - Continue all current medications as prescribed. - Follow up in 6 months, in April 2026.

## 2024-08-24 NOTE — Progress Notes (Signed)
   Annual physical  Subjective   Patient ID: Joel Dominguez, male    DOB: 1951-03-04  Age: 73 y.o. MRN: [id number]  Chief Complaint  Patient presents with   Annual Exam   HPI Joel Dominguez is a 73 y.o. old male here  for annual exam.   Subjective - Routine follow-up. Reports no new concerns. - Followed up with cardiology in August 2025, no medication changes made. - Weight has been stable over the past year. - Sees a pain management specialist for low back pain, next appointment is next month. Uses a TENS unit with variable relief. - Recently obtained hearing aids for both ears, reports significant improvement in hearing.  Medications Current medications include Bumex , carvedilol , Eliquis , spironolactone , and Norco as prescribed by pain management.  PMH, PSH, FH, Social Hx PMHx: Low back pain, elevated triglycerides.  ROS Constitutional: No weight gain reported. Ears: Reports improved hearing with new bilateral hearing aids.    The ASCVD Risk score (Arnett DK, et al., 2019) failed to calculate for the following reasons:   Risk score cannot be calculated because patient has a medical history suggesting prior/existing ASCVD  Health Maintenance Due  Topic Date Due   DTaP/Tdap/Td (1 - Tdap) Never done   Zoster Vaccines- Shingrix (1 of 2) Never done   COVID-19 Vaccine (3 - Moderna risk series) 02/12/2020   Influenza Vaccine  06/04/2024      Objective:     BP 131/75   Pulse 76   Ht 6' (1.829 m)   Wt 261 lb (118.4 kg)   SpO2 98%   BMI 35.40 kg/m    Physical Exam Gen: alert, oriented HEENT: perrla, eomi, mmm CV: rrr, no murmur Pulm: lctab. No wheeze or crackles.  GI: soft, nbs.  Nontender to palpation MSK: strength equal b/l. Normal gait Ext: no pedal edema.  Right sided AFO Skin: warm and dry, no rashes Psych: pleasant affect.  Spontaneous speech   No results found for any visits on 08/24/24.      Assessment & Plan:   Physical exam, annual  Healthcare  maintenance Assessment & Plan: Patient is here for a routine follow-up. No new concerns reported. Labs from last week showed slightly elevated triglycerides, but not clinically significant to warrant medication changes, especially considering non-fasting status. Weight has been stable. Sees cardiology and pain management regularly. Blood pressure was elevated on initial check but patient took medication right before the appointment. - Pneumococcal 20 vaccine administered today. - Has appointment for influenza vaccine on 09/08/2024. - Recheck blood pressure before leaving. - Continue all current medications as prescribed. - Follow up in 6 months, in April 2026.   Chronic atrial fibrillation (HCC) Assessment & Plan: Continue eliquis  and coreg .    Chronic combined systolic and diastolic heart failure (HCC) Assessment & Plan: Takes bumex , carvedilol , spironolactone .  follows with cardiology   Pulmonary hypertension (HCC) Assessment & Plan: Continue chf treatment and cpap for sleep apnea.    Immunization due -     Pneumococcal conjugate vaccine 20-valent     Return in about 6 months (around 02/22/2025) for HTN.    Toribio MARLA Slain, MD

## 2024-08-29 DIAGNOSIS — G8929 Other chronic pain: Secondary | ICD-10-CM | POA: Diagnosis not present

## 2024-08-29 DIAGNOSIS — M25562 Pain in left knee: Secondary | ICD-10-CM | POA: Diagnosis not present

## 2024-08-29 DIAGNOSIS — M25561 Pain in right knee: Secondary | ICD-10-CM | POA: Diagnosis not present

## 2024-08-29 NOTE — Assessment & Plan Note (Signed)
 Continue chf treatment and cpap for sleep apnea.

## 2024-08-29 NOTE — Assessment & Plan Note (Signed)
 Continue eliquis and coreg

## 2024-08-29 NOTE — Assessment & Plan Note (Signed)
 Takes bumex , carvedilol , spironolactone .  follows with cardiology

## 2024-08-31 DIAGNOSIS — H2511 Age-related nuclear cataract, right eye: Secondary | ICD-10-CM | POA: Diagnosis not present

## 2024-08-31 DIAGNOSIS — H40001 Preglaucoma, unspecified, right eye: Secondary | ICD-10-CM | POA: Diagnosis not present

## 2024-08-31 DIAGNOSIS — H21542 Posterior synechiae (iris), left eye: Secondary | ICD-10-CM | POA: Diagnosis not present

## 2024-08-31 DIAGNOSIS — H35361 Drusen (degenerative) of macula, right eye: Secondary | ICD-10-CM | POA: Diagnosis not present

## 2024-08-31 DIAGNOSIS — H35031 Hypertensive retinopathy, right eye: Secondary | ICD-10-CM | POA: Diagnosis not present

## 2024-09-08 ENCOUNTER — Ambulatory Visit (INDEPENDENT_AMBULATORY_CARE_PROVIDER_SITE_OTHER)

## 2024-09-08 VITALS — Temp 97.6°F

## 2024-09-08 DIAGNOSIS — Z23 Encounter for immunization: Secondary | ICD-10-CM

## 2024-09-17 ENCOUNTER — Other Ambulatory Visit: Payer: Self-pay | Admitting: Cardiology

## 2024-09-17 DIAGNOSIS — I110 Hypertensive heart disease with heart failure: Secondary | ICD-10-CM

## 2024-10-20 ENCOUNTER — Ambulatory Visit: Payer: Self-pay

## 2024-10-20 ENCOUNTER — Encounter: Payer: Self-pay | Admitting: Family Medicine

## 2024-10-20 VITALS — BP 145/74 | HR 86 | Temp 97.7°F | Ht 72.0 in | Wt 260.0 lb

## 2024-10-20 DIAGNOSIS — J209 Acute bronchitis, unspecified: Secondary | ICD-10-CM | POA: Diagnosis not present

## 2024-10-20 MED ORDER — AMOXICILLIN-POT CLAVULANATE 875-125 MG PO TABS: 1.0000 | ORAL_TABLET | Freq: Two times a day (BID) | ORAL | 0 refills | Status: AC

## 2024-10-20 MED ORDER — AZITHROMYCIN 250 MG PO TABS: ORAL_TABLET | ORAL | 0 refills | Status: AC

## 2024-10-20 NOTE — Telephone Encounter (Signed)
 FYI Only or Action Required?: FYI only for provider: appointment scheduled on 10/20/2024.  Patient was last seen in primary care on 08/24/2024 by Chandra Toribio POUR, MD.  Called Nurse Triage reporting Cough.  Symptoms began week.  Interventions attempted: Nothing.  Symptoms are: gradually worsening.  Triage Disposition: See Physician Within 24 Hours  Patient/caregiver understands and will follow disposition?: Yes   Copied from CRM #[id number]. Topic: Appointments - Appointment Scheduling >> Oct 20, 2024  8:12 AM Emylou G wrote: Barky cough and wheezing.. Reason for Disposition  SEVERE coughing spells (e.g., whooping sound after coughing, vomiting after coughing)  Answer Assessment - Initial Assessment Questions 1. ONSET: When did the cough begin?      week 2. SEVERITY: How bad is the cough today?      Moderate - barky cough 3. SPUTUM: Describe the color of your sputum (e.g., none, dry cough; clear, white, yellow, green)     Beige - creamy 4. HEMOPTYSIS: Are you coughing up any blood? If Yes, ask: How much? (e.g., flecks, streaks, tablespoons, etc.)     na 5. DIFFICULTY BREATHING: Are you having difficulty breathing? If Yes, ask: How bad is it? (e.g., mild, moderate, severe)      SOB at times 6. FEVER: Do you have a fever? If Yes, ask: What is your temperature, how was it measured, and when did it start?     no 7. CARDIAC HISTORY: Do you have any history of heart disease? (e.g., heart attack, congestive heart failure)  CHF, A 8. LUNG HISTORY: Do you have any history of lung disease?  (e.g., pulmonary embolus, asthma, emphysema)     pulm HTN, sleep apnea 9. PE RISK FACTORS: Do you have a history of blood clots? (or: recent major surgery, recent prolonged travel, bedridden)     na 10. OTHER SYMPTOMS: Do you have any other symptoms? (e.g., runny nose, wheezing, chest pain)       Wheezing, coughing spells w/SOB, chills 11. PREGNANCY: Is there any chance you  are pregnant? When was your last menstrual period?       na 12. TRAVEL: Have you traveled out of the country in the last month? (e.g., travel history, exposures)       na  Protocols used: Cough - Acute Productive-A-AH

## 2024-10-20 NOTE — Patient Instructions (Signed)
 It was nice to see you today,  We addressed the following topics today: - Please continue taking Mucinex  for congestion. - Please take the Augmentin  twice a day and the azithromycin  once a day for 5 days. - If your cough is not better after you finish the antibiotics, please let us  know.  Have a great day,  Rolan Slain, MD

## 2024-10-20 NOTE — Assessment & Plan Note (Signed)
 Presents with a 10-day history of a productive cough, wheezing, and chest pressure. Symptoms are not improving with over-the-counter medications. The clinical picture is suggestive of a bacterial infection. - Prescribed Augmentin , one tablet twice daily for 5 days. - Prescribed azithromycin , one tablet daily for 5 days. - Continue Mucinex  as needed for congestion. - Advised to follow up if symptoms do not improve after completing the course of antibiotics.

## 2024-10-20 NOTE — Progress Notes (Unsigned)
° °  Established Patient Office Visit  Subjective   Patient ID: Mercedes Solem, male    DOB: 07-Sep-1951  Age: 73 y.o. MRN: [id number]  Chief Complaint  Patient presents with   Cough    HPI  Subjective - Presents with a cough for approximately 10 days. Reports it has not improved and is associated with wheezing and chest pressure from congestion. Today is the first day the cough is not described as a bark. Reports some shortness of breath, which is worse with exertion and coughing. Productive cough with beige, creamy sputum. Reports chills but denies fever. He was exposed to a sick granddaughter and wife prior to symptom onset.  Medications: Mucinex  for almost 10 days. Also taking an over-the-counter Quercetin pill. Reports cough medicines are usually more effective for him than pills.  PMH: Allergy to Keflex. History of taking Augmentin  and azithromycin  in the past.  ROS: Constitutional: Denies fever. Reports chills. Respiratory: Reports cough, wheezing, and chest pressure. Reports some shortness of breath. All other systems reviewed and are negative.   The ASCVD Risk score (Arnett DK, et al., 2019) failed to calculate for the following reasons:   Risk score cannot be calculated because patient has a medical history suggesting prior/existing ASCVD   * - Cholesterol units were assumed  Health Maintenance Due  Topic Date Due   DTaP/Tdap/Td (1 - Tdap) Never done   Zoster Vaccines- Shingrix (1 of 2) Never done   COVID-19 Vaccine (3 - Moderna risk series) 02/12/2020      Objective:     BP (!) 156/82   Pulse 86   Temp 97.7 F (36.5 C)   Ht 6' (1.829 m)   Wt 260 lb (117.9 kg)   SpO2 99%   BMI 35.26 kg/m  {Vitals History (Optional):23777}  Physical Exam Gen: alert, oriented Pulm: no respiratory distress Psych: pleasant affect   No results found for any visits on 10/20/24.      Assessment & Plan:   Acute bronchitis, unspecified organism Assessment &  Plan: Presents with a 10-day history of a productive cough, wheezing, and chest pressure. Symptoms are not improving with over-the-counter medications. The clinical picture is suggestive of a bacterial infection. - Prescribed Augmentin , one tablet twice daily for 5 days. - Prescribed azithromycin , one tablet daily for 5 days. - Continue Mucinex  as needed for congestion. - Advised to follow up if symptoms do not improve after completing the course of antibiotics.   Other orders -     Amoxicillin -Pot Clavulanate; Take 1 tablet by mouth 2 (two) times daily for 5 days.  Dispense: 10 tablet; Refill: 0 -     Azithromycin ; Take 2 tablets on day 1, then 1 tablet daily on days 2 through 5  Dispense: 6 tablet; Refill: 0     Return if symptoms worsen or fail to improve.    Toribio MARLA Slain, MD

## 2025-02-03 ENCOUNTER — Ambulatory Visit

## 2025-02-22 ENCOUNTER — Ambulatory Visit: Admitting: Family Medicine

## 2025-03-11 ENCOUNTER — Ambulatory Visit: Admitting: Cardiology
